# Patient Record
Sex: Female | Born: 1960 | Race: White | Hispanic: No | Marital: Married | Smoking: Never smoker
Health system: Southern US, Community
[De-identification: ages and names within clinical notes are randomized; demographics above are authoritative.]

## PROBLEM LIST (undated history)

## (undated) ENCOUNTER — Ambulatory Visit (HOSPITAL_COMMUNITY): Admission: EM | Payer: Self-pay

## (undated) ENCOUNTER — Inpatient Hospital Stay (HOSPITAL_COMMUNITY): Payer: Self-pay

## (undated) DIAGNOSIS — E785 Hyperlipidemia, unspecified: Secondary | ICD-10-CM

## (undated) DIAGNOSIS — F32A Depression, unspecified: Secondary | ICD-10-CM

## (undated) DIAGNOSIS — Z5189 Encounter for other specified aftercare: Secondary | ICD-10-CM

## (undated) DIAGNOSIS — D509 Iron deficiency anemia, unspecified: Secondary | ICD-10-CM

## (undated) DIAGNOSIS — J45909 Unspecified asthma, uncomplicated: Secondary | ICD-10-CM

## (undated) DIAGNOSIS — F329 Major depressive disorder, single episode, unspecified: Secondary | ICD-10-CM

## (undated) DIAGNOSIS — T7840XA Allergy, unspecified, initial encounter: Secondary | ICD-10-CM

## (undated) DIAGNOSIS — G43909 Migraine, unspecified, not intractable, without status migrainosus: Secondary | ICD-10-CM

## (undated) DIAGNOSIS — O139 Gestational [pregnancy-induced] hypertension without significant proteinuria, unspecified trimester: Secondary | ICD-10-CM

## (undated) DIAGNOSIS — R87629 Unspecified abnormal cytological findings in specimens from vagina: Secondary | ICD-10-CM

## (undated) DIAGNOSIS — D75839 Thrombocytosis, unspecified: Secondary | ICD-10-CM

## (undated) DIAGNOSIS — G4730 Sleep apnea, unspecified: Secondary | ICD-10-CM

## (undated) DIAGNOSIS — O09529 Supervision of elderly multigravida, unspecified trimester: Secondary | ICD-10-CM

## (undated) DIAGNOSIS — F419 Anxiety disorder, unspecified: Secondary | ICD-10-CM

## (undated) DIAGNOSIS — M1990 Unspecified osteoarthritis, unspecified site: Secondary | ICD-10-CM

## (undated) DIAGNOSIS — D473 Essential (hemorrhagic) thrombocythemia: Secondary | ICD-10-CM

## (undated) DIAGNOSIS — K219 Gastro-esophageal reflux disease without esophagitis: Secondary | ICD-10-CM

## (undated) HISTORY — DX: Encounter for other specified aftercare: Z51.89

## (undated) HISTORY — DX: Iron deficiency anemia, unspecified: D50.9

## (undated) HISTORY — DX: Anxiety disorder, unspecified: F41.9

## (undated) HISTORY — DX: Unspecified osteoarthritis, unspecified site: M19.90

## (undated) HISTORY — DX: Allergy, unspecified, initial encounter: T78.40XA

## (undated) HISTORY — DX: Thrombocytosis, unspecified: D75.839

## (undated) HISTORY — DX: Essential (hemorrhagic) thrombocythemia: D47.3

## (undated) HISTORY — DX: Depression, unspecified: F32.A

## (undated) HISTORY — DX: Hyperlipidemia, unspecified: E78.5

## (undated) HISTORY — DX: Major depressive disorder, single episode, unspecified: F32.9

## (undated) HISTORY — PX: GYNECOLOGIC CRYOSURGERY: SHX857

## (undated) HISTORY — DX: Gastro-esophageal reflux disease without esophagitis: K21.9

## (undated) HISTORY — DX: Migraine, unspecified, not intractable, without status migrainosus: G43.909

## (undated) HISTORY — DX: Sleep apnea, unspecified: G47.30

---
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
## 1981-09-13 HISTORY — PX: NASAL SEPTUM SURGERY: SHX37

## 1989-05-14 DIAGNOSIS — Z5189 Encounter for other specified aftercare: Secondary | ICD-10-CM

## 1989-05-14 DIAGNOSIS — D509 Iron deficiency anemia, unspecified: Secondary | ICD-10-CM

## 1989-05-14 DIAGNOSIS — IMO0001 Reserved for inherently not codable concepts without codable children: Secondary | ICD-10-CM

## 1989-05-14 HISTORY — DX: Iron deficiency anemia, unspecified: D50.9

## 1989-05-14 HISTORY — DX: Reserved for inherently not codable concepts without codable children: IMO0001

## 1989-05-14 HISTORY — DX: Encounter for other specified aftercare: Z51.89

## 1998-05-20 ENCOUNTER — Other Ambulatory Visit: Admission: RE | Admit: 1998-05-20 | Discharge: 1998-05-20 | Payer: Self-pay | Admitting: Family Medicine

## 1999-06-17 ENCOUNTER — Other Ambulatory Visit: Admission: RE | Admit: 1999-06-17 | Discharge: 1999-06-17 | Payer: Self-pay | Admitting: Obstetrics and Gynecology

## 2001-05-02 ENCOUNTER — Other Ambulatory Visit: Admission: RE | Admit: 2001-05-02 | Discharge: 2001-05-02 | Payer: Self-pay | Admitting: Family Medicine

## 2001-05-08 ENCOUNTER — Encounter (HOSPITAL_COMMUNITY): Admission: RE | Admit: 2001-05-08 | Discharge: 2001-08-06 | Payer: Self-pay | Admitting: Family Medicine

## 2001-05-26 ENCOUNTER — Encounter (INDEPENDENT_AMBULATORY_CARE_PROVIDER_SITE_OTHER): Payer: Self-pay | Admitting: *Deleted

## 2001-05-26 ENCOUNTER — Ambulatory Visit (HOSPITAL_COMMUNITY): Admission: RE | Admit: 2001-05-26 | Discharge: 2001-05-26 | Payer: Self-pay | Admitting: Gastroenterology

## 2001-06-02 ENCOUNTER — Encounter: Payer: Self-pay | Admitting: Family Medicine

## 2001-06-02 ENCOUNTER — Encounter: Admission: RE | Admit: 2001-06-02 | Discharge: 2001-06-02 | Payer: Self-pay | Admitting: Family Medicine

## 2002-03-19 ENCOUNTER — Encounter: Payer: Self-pay | Admitting: Obstetrics and Gynecology

## 2002-03-19 ENCOUNTER — Ambulatory Visit (HOSPITAL_COMMUNITY): Admission: RE | Admit: 2002-03-19 | Discharge: 2002-03-19 | Payer: Self-pay | Admitting: Obstetrics and Gynecology

## 2002-07-16 ENCOUNTER — Other Ambulatory Visit: Admission: RE | Admit: 2002-07-16 | Discharge: 2002-07-16 | Payer: Self-pay | Admitting: Family Medicine

## 2003-09-05 ENCOUNTER — Other Ambulatory Visit: Admission: RE | Admit: 2003-09-05 | Discharge: 2003-09-05 | Payer: Self-pay | Admitting: Family Medicine

## 2004-09-15 ENCOUNTER — Other Ambulatory Visit: Admission: RE | Admit: 2004-09-15 | Discharge: 2004-09-15 | Payer: Self-pay | Admitting: Family Medicine

## 2006-12-15 ENCOUNTER — Encounter: Admission: RE | Admit: 2006-12-15 | Discharge: 2006-12-15 | Payer: Self-pay | Admitting: Family Medicine

## 2007-06-22 ENCOUNTER — Ambulatory Visit (HOSPITAL_COMMUNITY): Admission: RE | Admit: 2007-06-22 | Discharge: 2007-06-22 | Payer: Self-pay | Admitting: Obstetrics and Gynecology

## 2007-07-11 ENCOUNTER — Encounter: Admission: RE | Admit: 2007-07-11 | Discharge: 2007-07-11 | Payer: Self-pay | Admitting: Obstetrics and Gynecology

## 2007-10-19 ENCOUNTER — Ambulatory Visit (HOSPITAL_COMMUNITY): Admission: RE | Admit: 2007-10-19 | Discharge: 2007-10-19 | Payer: Self-pay | Admitting: Obstetrics and Gynecology

## 2007-10-19 ENCOUNTER — Encounter (INDEPENDENT_AMBULATORY_CARE_PROVIDER_SITE_OTHER): Payer: Self-pay | Admitting: Obstetrics and Gynecology

## 2008-06-27 ENCOUNTER — Observation Stay (HOSPITAL_COMMUNITY): Admission: AD | Admit: 2008-06-27 | Discharge: 2008-06-27 | Payer: Self-pay | Admitting: Obstetrics and Gynecology

## 2008-07-12 ENCOUNTER — Ambulatory Visit (HOSPITAL_COMMUNITY): Admission: RE | Admit: 2008-07-12 | Discharge: 2008-07-12 | Payer: Self-pay | Admitting: Obstetrics and Gynecology

## 2008-07-22 ENCOUNTER — Ambulatory Visit (HOSPITAL_COMMUNITY): Admission: RE | Admit: 2008-07-22 | Discharge: 2008-07-22 | Payer: Self-pay | Admitting: Obstetrics and Gynecology

## 2008-07-24 ENCOUNTER — Observation Stay (HOSPITAL_COMMUNITY): Admission: AD | Admit: 2008-07-24 | Discharge: 2008-07-25 | Payer: Self-pay | Admitting: Obstetrics and Gynecology

## 2008-08-06 ENCOUNTER — Ambulatory Visit (HOSPITAL_COMMUNITY): Admission: RE | Admit: 2008-08-06 | Discharge: 2008-08-06 | Payer: Self-pay | Admitting: Obstetrics and Gynecology

## 2008-08-09 ENCOUNTER — Ambulatory Visit (HOSPITAL_COMMUNITY): Admission: RE | Admit: 2008-08-09 | Discharge: 2008-08-09 | Payer: Self-pay | Admitting: Obstetrics and Gynecology

## 2008-08-15 ENCOUNTER — Ambulatory Visit (HOSPITAL_COMMUNITY): Admission: RE | Admit: 2008-08-15 | Discharge: 2008-08-15 | Payer: Self-pay | Admitting: Obstetrics and Gynecology

## 2008-08-19 ENCOUNTER — Encounter: Payer: Self-pay | Admitting: Obstetrics and Gynecology

## 2008-08-19 ENCOUNTER — Inpatient Hospital Stay (HOSPITAL_COMMUNITY): Admission: AD | Admit: 2008-08-19 | Discharge: 2008-08-27 | Payer: Self-pay | Admitting: Obstetrics and Gynecology

## 2008-08-20 ENCOUNTER — Encounter (HOSPITAL_COMMUNITY): Payer: Self-pay | Admitting: Obstetrics and Gynecology

## 2008-08-20 ENCOUNTER — Encounter (INDEPENDENT_AMBULATORY_CARE_PROVIDER_SITE_OTHER): Payer: Self-pay | Admitting: Obstetrics and Gynecology

## 2008-08-28 ENCOUNTER — Encounter: Admission: RE | Admit: 2008-08-28 | Discharge: 2008-09-27 | Payer: Self-pay | Admitting: Obstetrics and Gynecology

## 2008-09-28 ENCOUNTER — Encounter: Admission: RE | Admit: 2008-09-28 | Discharge: 2008-10-28 | Payer: Self-pay | Admitting: Obstetrics and Gynecology

## 2008-10-29 ENCOUNTER — Encounter: Admission: RE | Admit: 2008-10-29 | Discharge: 2008-11-25 | Payer: Self-pay | Admitting: Obstetrics and Gynecology

## 2008-11-26 ENCOUNTER — Encounter: Admission: RE | Admit: 2008-11-26 | Discharge: 2008-12-26 | Payer: Self-pay | Admitting: Obstetrics and Gynecology

## 2008-12-27 ENCOUNTER — Encounter: Admission: RE | Admit: 2008-12-27 | Discharge: 2009-01-25 | Payer: Self-pay | Admitting: Obstetrics and Gynecology

## 2009-01-26 ENCOUNTER — Encounter: Admission: RE | Admit: 2009-01-26 | Discharge: 2009-02-25 | Payer: Self-pay | Admitting: Obstetrics and Gynecology

## 2009-02-26 ENCOUNTER — Encounter: Admission: RE | Admit: 2009-02-26 | Discharge: 2009-03-27 | Payer: Self-pay | Admitting: Obstetrics and Gynecology

## 2009-03-28 ENCOUNTER — Encounter: Admission: RE | Admit: 2009-03-28 | Discharge: 2009-04-27 | Payer: Self-pay | Admitting: Obstetrics and Gynecology

## 2009-04-28 ENCOUNTER — Encounter: Admission: RE | Admit: 2009-04-28 | Discharge: 2009-05-28 | Payer: Self-pay | Admitting: Obstetrics and Gynecology

## 2009-05-29 ENCOUNTER — Encounter: Admission: RE | Admit: 2009-05-29 | Discharge: 2009-06-27 | Payer: Self-pay | Admitting: Obstetrics and Gynecology

## 2009-06-28 ENCOUNTER — Encounter: Admission: RE | Admit: 2009-06-28 | Discharge: 2009-07-28 | Payer: Self-pay | Admitting: Obstetrics and Gynecology

## 2009-07-29 ENCOUNTER — Encounter: Admission: RE | Admit: 2009-07-29 | Discharge: 2009-08-27 | Payer: Self-pay | Admitting: Obstetrics and Gynecology

## 2009-08-28 ENCOUNTER — Encounter: Admission: RE | Admit: 2009-08-28 | Discharge: 2009-09-11 | Payer: Self-pay | Admitting: Obstetrics and Gynecology

## 2009-09-28 ENCOUNTER — Encounter: Admission: RE | Admit: 2009-09-28 | Discharge: 2009-10-28 | Payer: Self-pay | Admitting: Obstetrics and Gynecology

## 2010-01-19 IMAGING — US US OB FOLLOW-UP
1 series · 14 of 28 positions shown · non-contrast
Comparison: none

OBSTETRICAL ULTRASOUND:
 This ultrasound was performed in The [HOSPITAL], and the [facility] report will be stored to [REDACTED] PACS.

[Series 1: us ob follow-up · 0.33mm/px · 49 acquisitions, 14 frames shown]
[im 2/49]
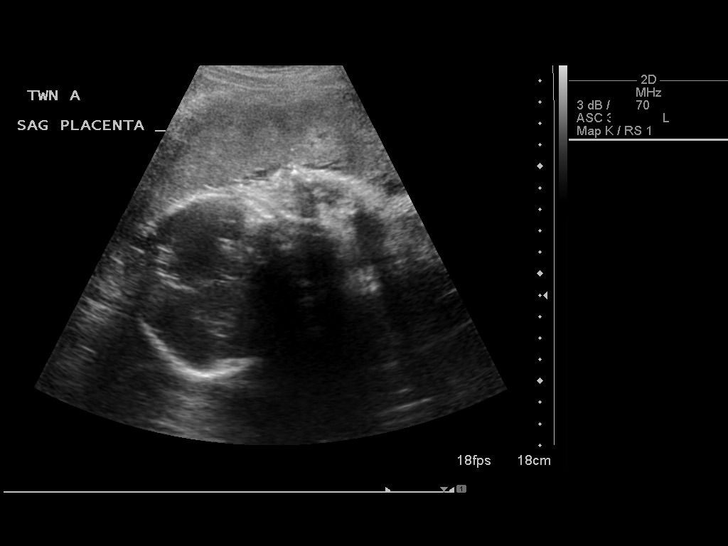
[im 6/49]
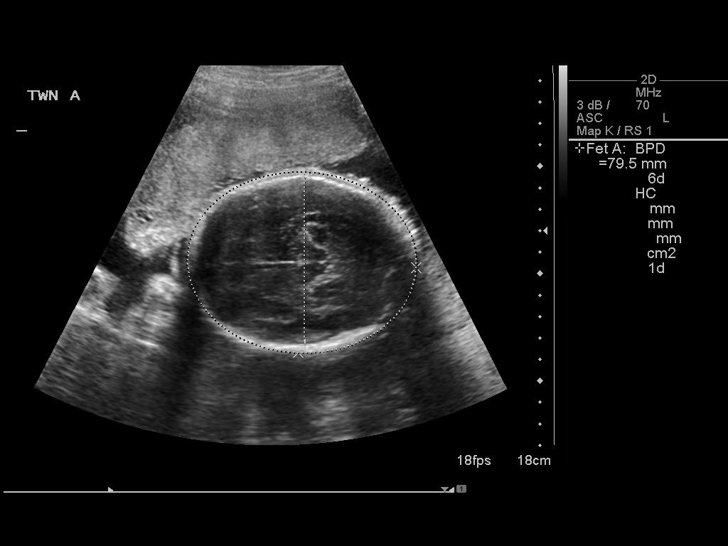
[im 9/49]
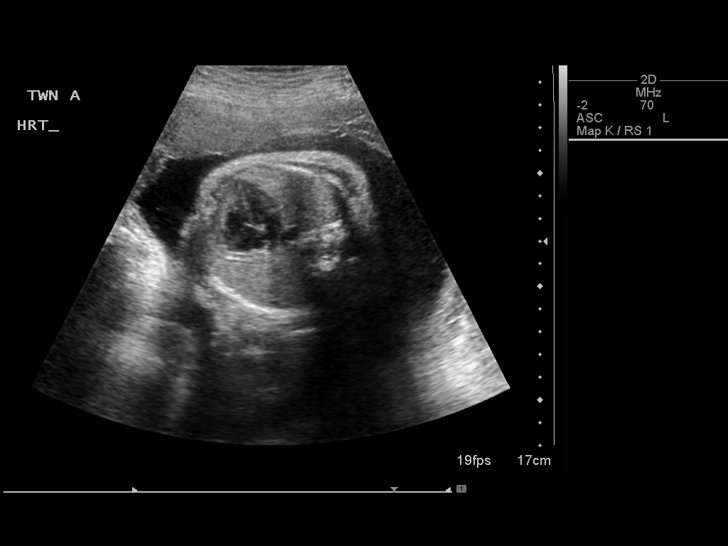
[im 13/49]
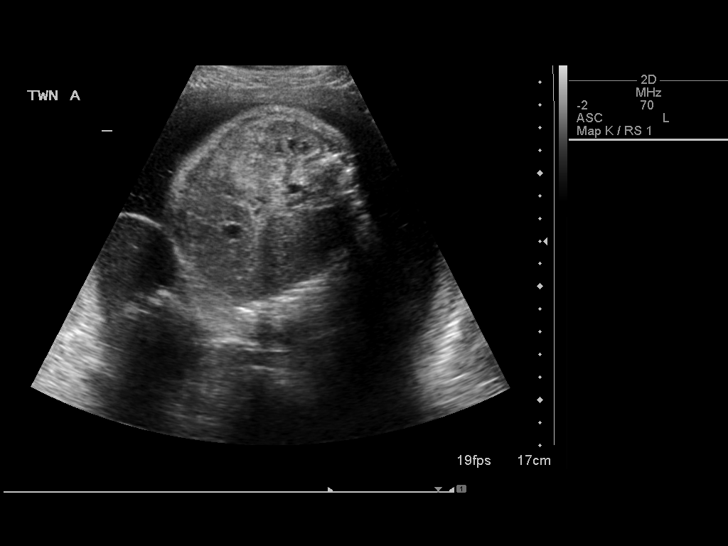
[im 17/49]
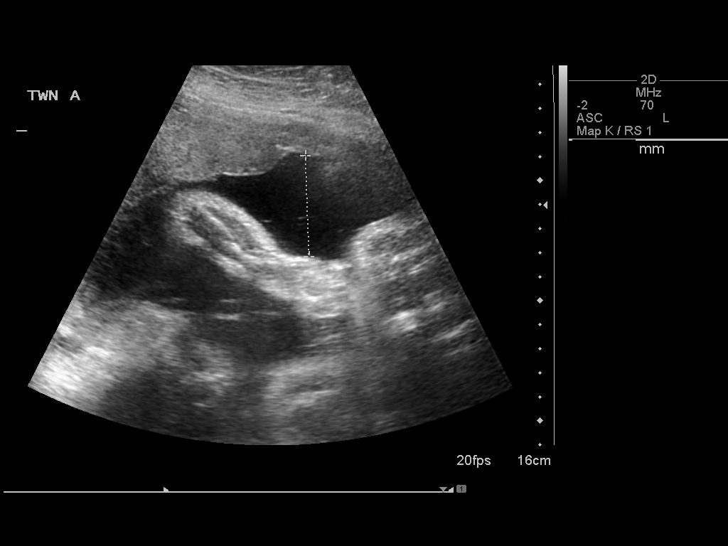
[im 20/49]
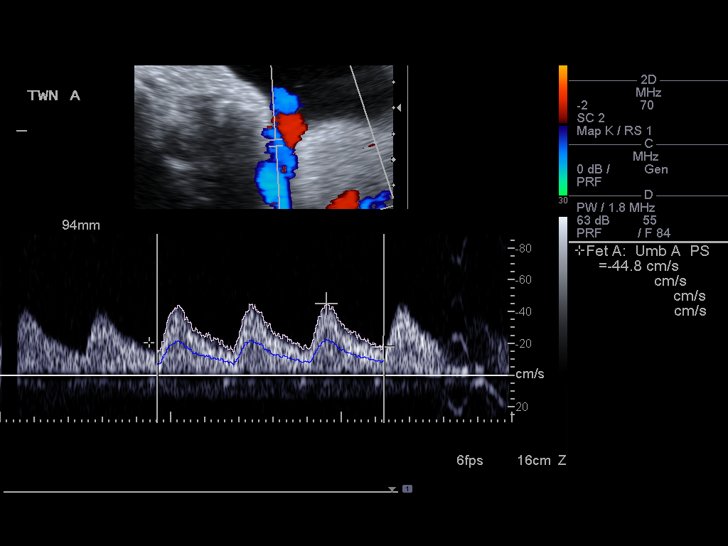
[im 24/49]
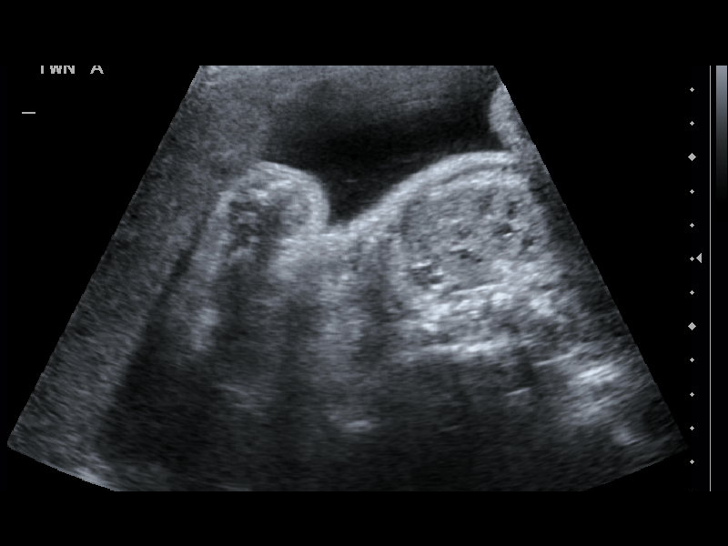
[im 27/49]
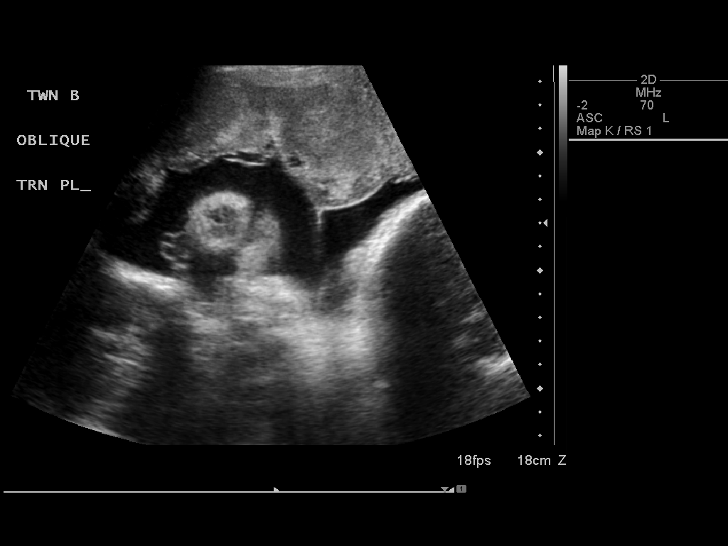
[im 31/49]
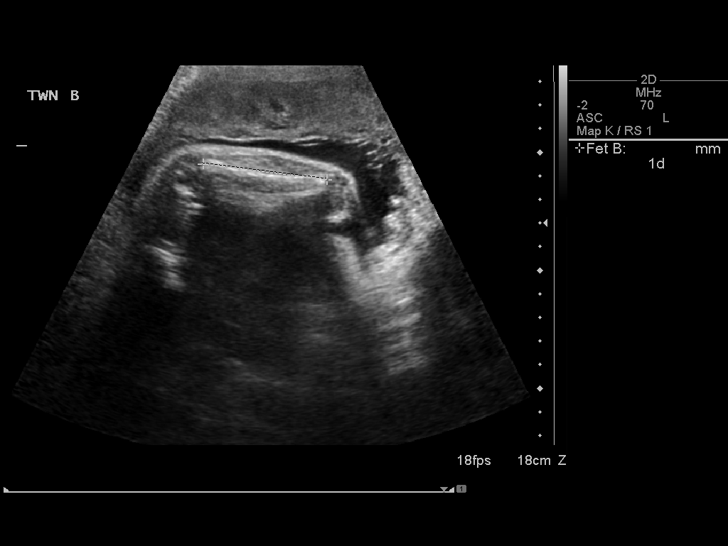
[im 34/49]
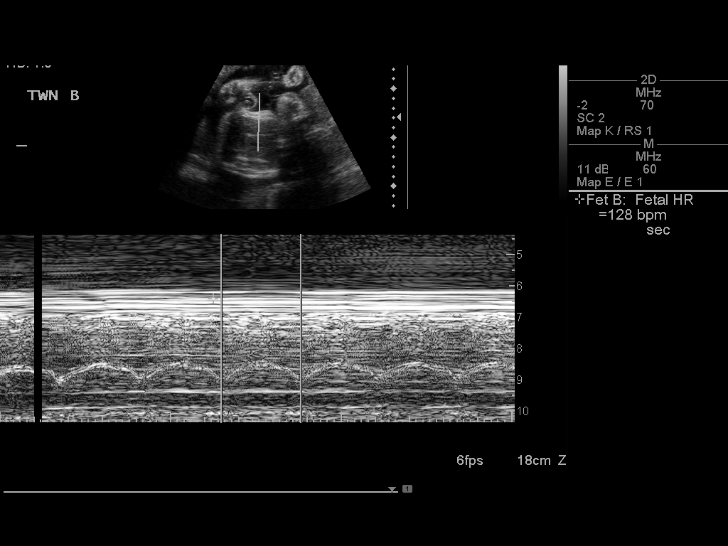
[im 38/49]
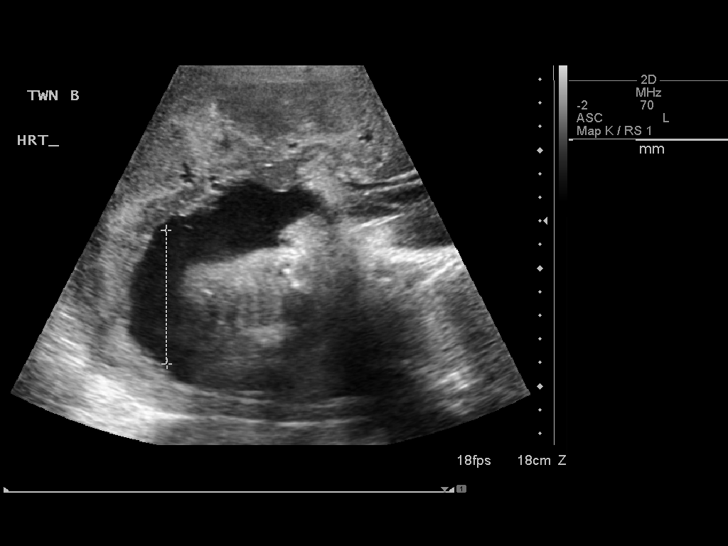
[im 41/49]
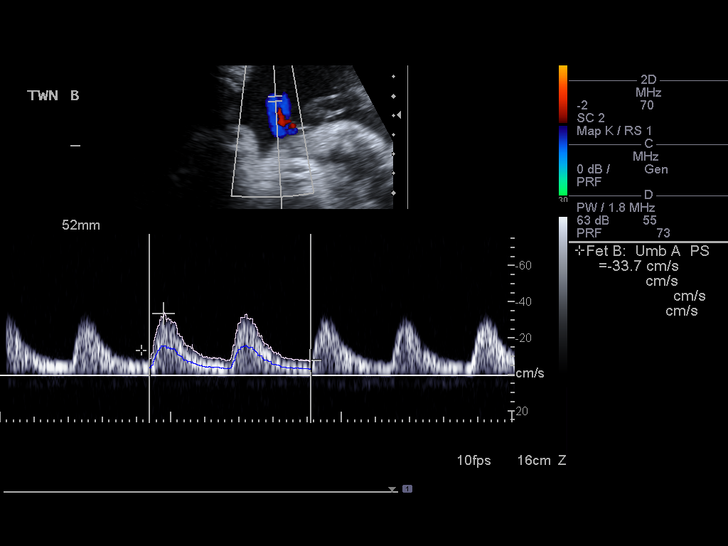
[im 45/49]
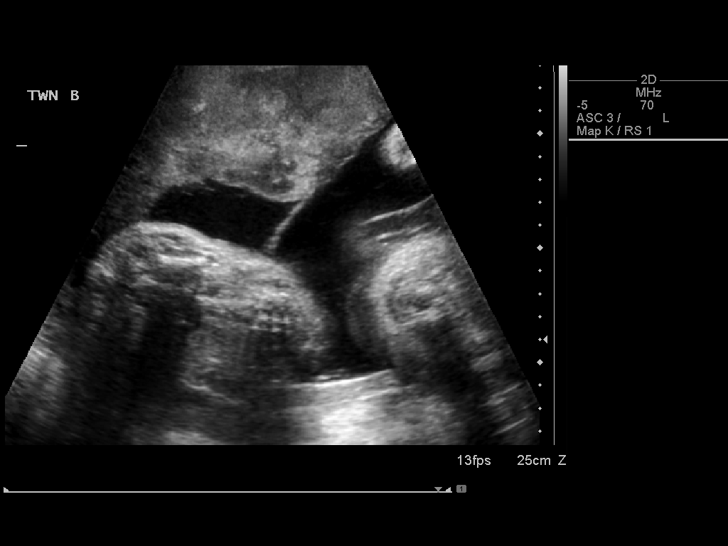
[im 49/49]
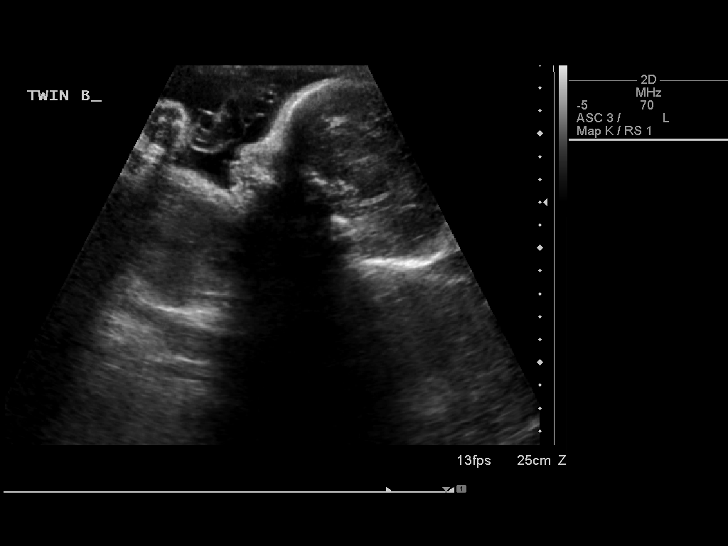

[14 of 28 positions shown; findings below may reference images not displayed]

IMPRESSION: [facility] has also been faxed to the ordering physician.

## 2010-10-04 ENCOUNTER — Encounter: Payer: Self-pay | Admitting: Obstetrics and Gynecology

## 2011-01-26 NOTE — Op Note (Signed)
NAME:  Bonnie Hall, Bonnie Hall NO.:  [id number]   MEDICAL RECORD NO.:  [id number]         PATIENT TYPE:  WINP   LOCATION:                                FACILITY:  [facility]   PHYSICIAN:  Zelphia Cairo, MD    DATE OF BIRTH:  Aug 20, 1961   DATE OF PROCEDURE:  08/20/2008  DATE OF DISCHARGE:                               OPERATIVE REPORT   PREOPERATIVE DIAGNOSES:  1. Twin gestation, breech position of baby A.  2. Intrauterine growth restriction baby B with gross discordance.  3. Increased ST ratio.   PROCEDURE:  Primary low transverse cesarean delivery.   SURGEON:  Zelphia Cairo, MD   ASSISTANT:  Stann Mainland. Vincente Poli, MD   ESTIMATED BLOOD LOSS:  800 mL.   FINDINGS:  Viable female infant in the breech presentation, viable female  infant in the oblique presentation, suspected placental abruption,  fibroid uterus, and normal-appearing adnexa.   SPECIMEN:  Placenta to Pathology.   COMPLICATIONS:  None.   CONDITION:  Stable to recovery room.   ANESTHESIA:  Spinal.   PROCEDURE IN DETAIL:  The patient was taken to the operating room where  spinal anesthesia was found to be adequate.  She was prepped and draped  in sterile fashion and a Foley catheter was inserted.  Pfannenstiel skin  incision was made with a scalpel and carried down to the underlying  fascia.  The fascia was incised in the midline.  This was extended  laterally using Mayo scissors.  Kocher clamps were used to grasp the  superior portion of the fascia.  The fascia was tented upwards and  underlying rectus muscles were dissected off using the Bovie.  The  inferior portion of the fascia was then tented upwards and the  underlying rectus muscles were dissected off using the Bovie.  Peritoneum was then identified and entered sharply with a hemostat.  This was extended superiorly and inferiorly with good visualization of  the bladder.  The bladder blade was then inserted.  Vesicouterine  peritoneum was incised  and extended laterally using Metzenbaum scissors.  The bladder blade was then reinserted behind the bladder flap.   Uterine incision was then made with the scalpel and this was extended  laterally using the surgeon's fingers.  Amniotic membranes of baby A was  ruptured for clear fluid and the baby was delivered in the breech  presentation using standard breech maneuvers, and the cord was clamped  and cut, and the infant was taken to the awaiting pediatric staff.  The  amniotic sac of baby B was then ruptured for clear fluid.  Infant was  noted to be oblique and delivered in the breech presentation using  breech maneuvers.  Cord was clamped and cut, and the infant was taken to  the awaiting pediatric staff.  The placenta was then manually removed  from the uterus.  The uterus was cleared of all clots and debris using a  dry lap sponge, and the uterus was exteriorized from the pelvis.  The  uterine incision was reapproximated using 0 chromic in a running locked  fashion.  Once hemostasis was assured, the  uterus was placed back into  the pelvis and the pelvis was copiously irrigated with warm normal  saline.  Uterine incision was reinspected and found to be hemostatic.  Peritoneum was then closed using a Vicryl.  The fascia was  reapproximated using Vicryl and the skin was closed with staples.  Sponge, lap, needle, and instrument counts were correct x2.  She was  taken to the recovery room in stable condition.      Zelphia Cairo, MD  Electronically Signed     GA/MEDQ  D:  08/20/2008  T:  08/21/2008  Job:  [id number]

## 2011-01-26 NOTE — Op Note (Signed)
NAME:  Bonnie Hall, Bonnie Hall           ACCOUNT NO.:  [id number]   MEDICAL RECORD NO.:  [id number]          PATIENT TYPE:  AMB   LOCATION:  SDC                           [facility]:  [facility]   PHYSICIAN:  Michelle L. Grewal, M.D.DATE OF BIRTH:  Sep 02, 1961   DATE OF PROCEDURE:  10/19/2007  DATE OF DISCHARGE:                               OPERATIVE REPORT   PREOPERATIVE DIAGNOSIS:  Endometrial polyp.   POSTOPERATIVE DIAGNOSIS:  Endometrial polyp.   PROCEDURE:  Dilatation and curettage, hysteroscopy.   SURGEON:  Michelle L. Vincente Poli, M.D.   ANESTHESIA:  MAC with local.   FINDINGS:  Endometrial curettings.  The hysteroscopy revealed a small  endometrial polyp.   PROCEDURE IN DETAIL:  The patient was taken to the operating room.  Anesthesia was administered.  She was prepped and draped in the usual  sterile fashion.  In-and-out catheter was inserted and the bladder was  drained.  The speculum was inserted, the cervix was grasped with a  tenaculum, and a paracervical block was performed in a standard fashion.  The cervical internal os was gently dilated using Pratt dilators.  The  diagnostic hysteroscope was inserted with excellent visualization of the  endometrial cavity revealing small polypoid areas. The hysteroscope was  removed.  A sharp uterine curettage was performed x2 with retrieval of  tissue consistent with small polyps.  The hysteroscope was inserted a  final time.  The uterine cavity was clean.  All instruments were removed  from the vagina.  The patient tolerated the procedure very well.  She  went to the recovery room in stable condition.      Michelle L. Vincente Poli, M.D.  Electronically Signed     MLG/MEDQ  D:  10/19/2007  T:  10/19/2007  Job:  [id number]

## 2011-01-26 NOTE — Discharge Summary (Signed)
NAME:  Bonnie Hall, Bonnie Hall           ACCOUNT NO.:  [id number]   MEDICAL RECORD NO.:  [id number]          PATIENT TYPE:  INP   LOCATION:  9303                          [facility]ILITY:  [facility]   PHYSICIAN:  Juluis Mire, M.D.   DATE OF BIRTH:  Jan 08, 1961   DATE OF ADMISSION:  08/19/2008  DATE OF DISCHARGE:  08/27/2008                               DISCHARGE SUMMARY   ADMITTING DIAGNOSES:  1. Intrauterine pregnancy at 14 and 5/7 weeks' estimated gestational      age.  2. Twin gestation with discordant growth.   DISCHARGE DIAGNOSES:  1. Status post low transverse cesarean section.  2. Viable female and female infants.  3. Pregnancy-induced hypertension, resolving.  4. Sinusitis.   PROCEDURE:  Primary low transverse cesarean section.   REASON FOR ADMISSION:  Please see written H and P.   HOSPITAL COURSE:  The patient is a 50 year old white married female that  was admitted to [facility] with a twin gestation at 18  and 5/7 weeks' estimated gestational age.  The patient has been followed  in the office, and had noted that she had twins with discordant growth.  The patient had undergone an ultrasound on the day of admission with  twin B with an estimated fetal weight of 2 pounds 11 ounces, which was  in the 1th percentile.  The patient was now admitted for further  evaluation.  The patient's pregnancy had been complicated by advanced  maternal age with an in vitro fertilization with a history of depression  and chronic fatigue syndrome.  The patient also was known to have a  bicornate uterus.  On admission, vital signs were stable.  Deep tendon  reflexes were 2+, no clonus.  Sonogram was performed with twin A, which  was within the breech presentation with biophysical profile 8/8 with  Dopplers okay.  Twin B within in the vertex presentation with  biophysical profile 6/8 with a normal Doppler study.  The patient was  now admitted for further evaluation on Maternal Fetal  Medicine  consultation.  The patient had previously received steroids at 21 weeks'  gestational age.  On hospital day #2, the patient did undergo further  ultrasound studies which had revealed continued discordant growth with  twin B with estimated fetal weight in the 1th percentile with normal  fluid.  Contractions were noted which had been resolved with some  terbutaline.  Fetal heart tones were reactive.  Based on Maternal Fetal  Medicine consultation,  decision was made to proceed with a cesarean  delivery.  The patient was then transferred to the operating room where  spinal anesthesia was administered without difficulty.  A low transverse  incision was made with delivery of viable female and female infants with  twin A in the breech presentation.  Twin B in the vertex presentation.  Uterus was known to have a fibroid, there was some suspicion for  placental abruption.  The placenta was now sent to Pathology for further  evaluation.  The patient tolerated the procedure well and was taken to  the recovery room in stable condition.  She was then  later transferred  to the [facility] where she was placed on magnesium sulfate.  Blood pressure  was 142/93.  Lungs were clear to auscultation.  Incision was clean, dry,  and intact.  Urine output was 650 mL per 8 hours.  Laboratory findings  showed hemoglobin of 11.8, platelet count of 281,000, SGOT and SGPT were  within normal limits.  On postoperative day #2, the patient was without  complaint.  Babies were stable in the NICU.  Vital signs were stable.  Blood pressure 140s/80s.  Abdomen soft with good return of bowel  function.  Abdominal dressing noted be clean, dry, and intact.  Magnesium sulfate was discontinued, and the patient was later  transferred to the mother-baby unit.  On postoperative day #3, the  patient complained of a mild temporal headache, no blurred vision or  right upper quadrant tenderness.  Vital signs were stable.  Blood   pressure was 144/86.  Deep tendon reflexes 1-2+ without clonus.  No  significant pedal edema was noted.  Abdomen was soft.  Fundus was firm  and nontender.  Incision was clean, dry, and intact with some ecchymosis  noted superior and inferior to the incisional site, which was soft.  The  patient was ambulating well.  The patient did have PIH labs that were  drawn.  During the night, blood pressure was noted to be 171/94 to  176/95.  Deep tendon reflexes 2+, no clonus.  The patient now complained  of continued persistent frontal headache without relief from medication.  She continued to deny visual disturbance or epigastric pain.  Lungs were  clear to auscultation.  Heart regular rate and rhythm.  Abdomen was  soft.  Incision was noted to have continued ecchymosis.  Deep tendon  reflexes are 2+.  Laboratory findings revealed platelet count of  397,000, creatinine was 0.62, AST was 67 and ALT was 42, decision was  made to start the patient back on magnesium sulfate.  She was now  transferred back to the AICU where she should be followed carefully.  In  the following morning, the patient continued to have a mild frontal  headache, no visual disturbance, no epigastric pain.  Blood pressure was  140s/80s.  Urine output was good.  Lungs continued to be clear to  auscultation.  Abdomen was soft.  Fundus firm and nontender.  Deep  tendon reflexes 2+, AST was 67, ALT was 42, magnesium was going at 2 g  per hour.  On the following morning, the patient complained of some  sinus headaches and stuffiness.  Blood pressure was 130s-140s/70s-80s.  SGOT was 38, SGPT was 41.  Incision now revealed some slight erythema.  Decision was made to start the patient on some Ceftin for questionable  early cellulitis and sinusitis.  On the following morning, the patient  denied any headache or blurred vision or right upper quadrant pain.  Blood pressure was 160/97 to 138/81.  She was afebrile.  Fundus firm and   nontender.  Incision to have a small hematoma.  PIH labs with a SGOT of  31 and SGPT of 36.  Otherwise, labs were within normal limits.  Discharge instructions were reviewed and the patient was later  discharged home.   CONDITION ON DISCHARGE:  Stable.   DIET:  Regular as tolerated.   ACTIVITY:  No heavy lifting, no driving x2 weeks, no vaginal entry.   FOLLOWUP:  The patient is to follow up in the office in 2-3 days for  incision check  and blood pressure check.  She is to call for temperature  greater than 100 degrees, persistent nausea, vomiting, heavy vaginal  bleeding and/or redness or drainage from incisional site.  The patient  was also instructed to call for headache or right upper quadrant pain.   DISCHARGE MEDICATIONS:  1. Ceftin 250 mg one p.o. b.i.d. x10 days.  2. Tylox #30, one p.o. every 4 hours p.r.n.  3. Prenatal vitamins one p.o. daily.      Julio Sicks, N.P.      Juluis Mire, M.D.  Electronically Signed    CC/MEDQ  D:  10/24/2008  T:  10/24/2008  Job:  [id number]

## 2011-01-29 NOTE — Procedures (Signed)
[facility]  Patient:    Bonnie Hall, Bonnie Hall Visit Number: [id number] MRN: [id number]          Service Type: END Location: ENDO Attending Physician:  Charna Elizabeth Dictated by:   Anselmo Rod, M.D. Proc. Date: 05/26/01 Admit Date:  05/26/2001   CC:         Talmadge Coventry, M.D.   Procedure Report  DATE OF BIRTH:  1961-05-30.  PROCEDURE:  Colonoscopy.  ENDOSCOPIST:  Anselmo Rod, M.D.  INSTRUMENT USED:  Olympus video colonoscope.  INDICATION FOR PROCEDURE:  A 50 year old white female with a history of iron deficiency anemia, hemoglobin being down to 6.6 g/dl in the recent past.  Rule out colonic polyps, masses, hemorrhoids, etc.  PREPROCEDURE PREPARATION:  Informed consent was procured from the patient. The patient was fasted for eight hours prior to the procedure and prepped with a bottle of magnesium citrate and a gallon of NuLytely the night prior to the procedure.  PREPROCEDURE PHYSICAL:  VITAL SIGNS:  The patient had stable vital signs.  NECK:  Supple.  CHEST:  Clear to auscultation.  S1, S2 regular.  ABDOMEN:  Soft with normal bowel sounds.  DESCRIPTION OF PROCEDURE:  The patient was placed in the left lateral decubitus position and sedated with an additional 2.5 mg of Versed intravenously.  Once the patient was adequately sedate and maintained on low-flow oxygen and continuous cardiac monitoring, the Olympus video colonoscope was advanced from the rectum to the cecum with slight difficulty. There was some residual stool in the colon, especially in the cecum and right colon.  The cecal base was not adequately visualized.  No masses, polyps, erosions, or ulcerations were noted.  A small, nonbleeding internal hemorrhoid was seen on retroflexion in the rectum.  The patient tolerated the procedure well without complication.  IMPRESSION: 1. A large amount of residual stool in the colon, cecal base difficult to  visualize. 2. No masses or polyps seen, no evidence of diverticulosis.  Small lesions    could have been missed. 3. Small, nonbleeding internal hemorrhoid.  RECOMMENDATIONS: 1. Proceed with a CT scan of the abdomen and pelvis and a small bowel    follow-through to complete the patients GI evaluation. 2. Continue iron supplementation. 3. Serial CBCs as discussed with the patient. Dictated by:   Anselmo Rod, M.D. Attending Physician:  Charna Elizabeth DD:  05/26/01 TD:  05/26/01 Job: [id number]

## 2011-04-06 ENCOUNTER — Other Ambulatory Visit: Payer: Self-pay | Admitting: Internal Medicine

## 2011-04-06 ENCOUNTER — Ambulatory Visit (HOSPITAL_COMMUNITY)
Admission: RE | Admit: 2011-04-06 | Discharge: 2011-04-06 | Disposition: A | Discharge status: Home / Self Care | Source: Ambulatory Visit | Attending: Internal Medicine | Admitting: Internal Medicine

## 2011-04-06 ENCOUNTER — Other Ambulatory Visit (HOSPITAL_COMMUNITY): Payer: Self-pay | Admitting: Internal Medicine

## 2011-04-06 DIAGNOSIS — M79609 Pain in unspecified limb: Secondary | ICD-10-CM | POA: Insufficient documentation

## 2011-04-06 DIAGNOSIS — M545 Low back pain, unspecified: Secondary | ICD-10-CM

## 2011-04-06 DIAGNOSIS — M546 Pain in thoracic spine: Secondary | ICD-10-CM | POA: Insufficient documentation

## 2011-04-06 DIAGNOSIS — G43909 Migraine, unspecified, not intractable, without status migrainosus: Secondary | ICD-10-CM

## 2011-04-07 ENCOUNTER — Ambulatory Visit
Admission: RE | Admit: 2011-04-07 | Discharge: 2011-04-07 | Disposition: A | Discharge status: Home / Self Care | Source: Ambulatory Visit | Attending: Internal Medicine | Admitting: Internal Medicine

## 2011-04-07 DIAGNOSIS — G43909 Migraine, unspecified, not intractable, without status migrainosus: Secondary | ICD-10-CM

## 2011-04-07 MED ORDER — GADOBENATE DIMEGLUMINE 529 MG/ML IV SOLN
17.0000 mL | Freq: Once | INTRAVENOUS | Status: AC | PRN
  Administered 2011-04-07: 17 mL via INTRAVENOUS

## 2011-06-04 LAB — CBC
HCT: 36.6
Hemoglobin: 12.2
MCHC: 33.4
MCV: 87.9
Platelets: 519 — ABNORMAL HIGH
RBC: 4.16
RDW: 16.2 — ABNORMAL HIGH
WBC: 5.6

## 2011-06-04 LAB — PREGNANCY, URINE: Preg Test, Ur: NEGATIVE

## 2011-06-15 LAB — COMPREHENSIVE METABOLIC PANEL
ALT: 20
ALT: 22
AST: 30
AST: 30
Albumin: 2.4 — ABNORMAL LOW
Albumin: 2.5 — ABNORMAL LOW
Alkaline Phosphatase: 76
Alkaline Phosphatase: 82
BUN: 11
BUN: 8
CO2: 23
CO2: 25
Calcium: 8.7
Calcium: 8.9
Chloride: 102
Chloride: 104
Creatinine, Ser: 0.63
Creatinine, Ser: 0.67
GFR calc Af Amer: >60
GFR calc Af Amer: >60
GFR calc non Af Amer: >60
GFR calc non Af Amer: >60
Glucose, Bld: 109 — ABNORMAL HIGH
Glucose, Bld: 138 — ABNORMAL HIGH
Potassium: 3.9
Potassium: 4.2
Sodium: 132 — ABNORMAL LOW
Sodium: 134 — ABNORMAL LOW
Total Bilirubin: 0.4
Total Bilirubin: 0.5
Total Protein: 5.4 — ABNORMAL LOW
Total Protein: 5.7 — ABNORMAL LOW

## 2011-06-15 LAB — PROTIME-INR
INR: 0.9
Prothrombin Time: 11.8

## 2011-06-15 LAB — CBC
HCT: 37.5
HCT: 39.8
Hemoglobin: 12.1
Hemoglobin: 13.2
MCHC: 32.3
MCHC: 33.2
MCV: 89.2
MCV: 90.5
Platelets: 276
Platelets: 305
RBC: 4.15
RBC: 4.46
RDW: 26.2 — ABNORMAL HIGH
RDW: 26.3 — ABNORMAL HIGH
WBC: 11.6 — ABNORMAL HIGH
WBC: 15.8 — ABNORMAL HIGH

## 2011-06-15 LAB — APTT: aPTT: 24

## 2011-06-15 LAB — KLEIHAUER-BETKE STAIN
Fetal Cells %: 0
Quantitation Fetal Hemoglobin: 0

## 2011-06-15 LAB — WET PREP, GENITAL
Clue Cells Wet Prep HPF POC: NONE SEEN
Trich, Wet Prep: NONE SEEN
WBC, Wet Prep HPF POC: NONE SEEN
Yeast Wet Prep HPF POC: NONE SEEN

## 2011-06-15 LAB — URIC ACID
Uric Acid, Serum: 4
Uric Acid, Serum: 4.8

## 2011-06-15 LAB — LACTATE DEHYDROGENASE: LDH: 128

## 2011-06-18 LAB — COMPREHENSIVE METABOLIC PANEL
ALT: 17 U/L (ref 0–35)
ALT: 17 U/L (ref 0–35)
ALT: 22 U/L (ref 0–35)
ALT: 36 U/L — ABNORMAL HIGH (ref 0–35)
ALT: 41 U/L — ABNORMAL HIGH (ref 0–35)
ALT: 42 U/L — ABNORMAL HIGH (ref 0–35)
ALT: 44 U/L — ABNORMAL HIGH (ref 0–35)
ALT: 46 U/L — ABNORMAL HIGH (ref 0–35)
ALT: 50 U/L — ABNORMAL HIGH (ref 0–35)
AST: 25 U/L (ref 0–37)
AST: 26 U/L (ref 0–37)
AST: 31 U/L (ref 0–37)
AST: 33 U/L (ref 0–37)
AST: 38 U/L — ABNORMAL HIGH (ref 0–37)
AST: 49 U/L — ABNORMAL HIGH (ref 0–37)
AST: 58 U/L — ABNORMAL HIGH (ref 0–37)
AST: 67 U/L — ABNORMAL HIGH (ref 0–37)
AST: 76 U/L — ABNORMAL HIGH (ref 0–37)
Albumin: 2 g/dL — ABNORMAL LOW (ref 3.5–5.2)
Albumin: 2.1 g/dL — ABNORMAL LOW (ref 3.5–5.2)
Albumin: 2.1 g/dL — ABNORMAL LOW (ref 3.5–5.2)
Albumin: 2.1 g/dL — ABNORMAL LOW (ref 3.5–5.2)
Albumin: 2.1 g/dL — ABNORMAL LOW (ref 3.5–5.2)
Albumin: 2.2 g/dL — ABNORMAL LOW (ref 3.5–5.2)
Albumin: 2.2 g/dL — ABNORMAL LOW (ref 3.5–5.2)
Albumin: 2.2 g/dL — ABNORMAL LOW (ref 3.5–5.2)
Albumin: 2.4 g/dL — ABNORMAL LOW (ref 3.5–5.2)
Alkaline Phosphatase: 122 U/L — ABNORMAL HIGH (ref 39–117)
Alkaline Phosphatase: 126 U/L — ABNORMAL HIGH (ref 39–117)
Alkaline Phosphatase: 126 U/L — ABNORMAL HIGH (ref 39–117)
Alkaline Phosphatase: 130 U/L — ABNORMAL HIGH (ref 39–117)
Alkaline Phosphatase: 131 U/L — ABNORMAL HIGH (ref 39–117)
Alkaline Phosphatase: 133 U/L — ABNORMAL HIGH (ref 39–117)
Alkaline Phosphatase: 134 U/L — ABNORMAL HIGH (ref 39–117)
Alkaline Phosphatase: 145 U/L — ABNORMAL HIGH (ref 39–117)
Alkaline Phosphatase: 151 U/L — ABNORMAL HIGH (ref 39–117)
BUN: 12 mg/dL (ref 6–23)
BUN: 13 mg/dL (ref 6–23)
BUN: 14 mg/dL (ref 6–23)
BUN: 14 mg/dL (ref 6–23)
BUN: 16 mg/dL (ref 6–23)
BUN: 17 mg/dL (ref 6–23)
BUN: 17 mg/dL (ref 6–23)
BUN: 19 mg/dL (ref 6–23)
BUN: 22 mg/dL (ref 6–23)
CO2: 24 mEq/L (ref 19–32)
CO2: 24 mEq/L (ref 19–32)
CO2: 25 mEq/L (ref 19–32)
CO2: 26 mEq/L (ref 19–32)
CO2: 26 mEq/L (ref 19–32)
CO2: 27 mEq/L (ref 19–32)
CO2: 28 mEq/L (ref 19–32)
CO2: 29 mEq/L (ref 19–32)
CO2: 30 mEq/L (ref 19–32)
Calcium: 6.5 mg/dL — ABNORMAL LOW (ref 8.4–10.5)
Calcium: 7.1 mg/dL — ABNORMAL LOW (ref 8.4–10.5)
Calcium: 7.9 mg/dL — ABNORMAL LOW (ref 8.4–10.5)
Calcium: 8.1 mg/dL — ABNORMAL LOW (ref 8.4–10.5)
Calcium: 8.3 mg/dL — ABNORMAL LOW (ref 8.4–10.5)
Calcium: 8.3 mg/dL — ABNORMAL LOW (ref 8.4–10.5)
Calcium: 8.4 mg/dL (ref 8.4–10.5)
Calcium: 8.6 mg/dL (ref 8.4–10.5)
Calcium: 8.7 mg/dL (ref 8.4–10.5)
Chloride: 100 mEq/L (ref 96–112)
Chloride: 101 mEq/L (ref 96–112)
Chloride: 101 mEq/L (ref 96–112)
Chloride: 103 mEq/L (ref 96–112)
Chloride: 103 mEq/L (ref 96–112)
Chloride: 104 mEq/L (ref 96–112)
Chloride: 104 mEq/L (ref 96–112)
Chloride: 104 mEq/L (ref 96–112)
Chloride: 106 mEq/L (ref 96–112)
Creatinine, Ser: 0.61 mg/dL (ref 0.4–1.2)
Creatinine, Ser: 0.62 mg/dL (ref 0.4–1.2)
Creatinine, Ser: 0.63 mg/dL (ref 0.4–1.2)
Creatinine, Ser: 0.66 mg/dL (ref 0.4–1.2)
Creatinine, Ser: 0.68 mg/dL (ref 0.4–1.2)
Creatinine, Ser: 0.71 mg/dL (ref 0.4–1.2)
Creatinine, Ser: 0.76 mg/dL (ref 0.4–1.2)
Creatinine, Ser: 0.76 mg/dL (ref 0.4–1.2)
Creatinine, Ser: 0.84 mg/dL (ref 0.4–1.2)
GFR calc Af Amer: >60 mL/min (ref >60)
GFR calc Af Amer: >60 mL/min (ref >60)
GFR calc Af Amer: >60 mL/min (ref >60)
GFR calc Af Amer: >60 mL/min (ref >60)
GFR calc Af Amer: >60 mL/min (ref >60)
GFR calc Af Amer: >60 mL/min (ref >60)
GFR calc Af Amer: >60 mL/min (ref >60)
GFR calc Af Amer: >60 mL/min (ref >60)
GFR calc Af Amer: >60 mL/min (ref >60)
GFR calc non Af Amer: >60 mL/min (ref >60)
GFR calc non Af Amer: >60 mL/min (ref >60)
GFR calc non Af Amer: >60 mL/min (ref >60)
GFR calc non Af Amer: >60 mL/min (ref >60)
GFR calc non Af Amer: >60 mL/min (ref >60)
GFR calc non Af Amer: >60 mL/min (ref >60)
GFR calc non Af Amer: >60 mL/min (ref >60)
GFR calc non Af Amer: >60 mL/min (ref >60)
GFR calc non Af Amer: >60 mL/min (ref >60)
Glucose, Bld: 103 mg/dL — ABNORMAL HIGH (ref 70–99)
Glucose, Bld: 103 mg/dL — ABNORMAL HIGH (ref 70–99)
Glucose, Bld: 108 mg/dL — ABNORMAL HIGH (ref 70–99)
Glucose, Bld: 116 mg/dL — ABNORMAL HIGH (ref 70–99)
Glucose, Bld: 121 mg/dL — ABNORMAL HIGH (ref 70–99)
Glucose, Bld: 75 mg/dL (ref 70–99)
Glucose, Bld: 79 mg/dL (ref 70–99)
Glucose, Bld: 80 mg/dL (ref 70–99)
Glucose, Bld: 81 mg/dL (ref 70–99)
Potassium: 4.1 mEq/L (ref 3.5–5.1)
Potassium: 4.2 mEq/L (ref 3.5–5.1)
Potassium: 4.2 mEq/L (ref 3.5–5.1)
Potassium: 4.3 mEq/L (ref 3.5–5.1)
Potassium: 4.5 mEq/L (ref 3.5–5.1)
Potassium: 4.7 mEq/L (ref 3.5–5.1)
Potassium: 4.9 mEq/L (ref 3.5–5.1)
Potassium: 4.9 mEq/L (ref 3.5–5.1)
Potassium: 5.1 mEq/L (ref 3.5–5.1)
Sodium: 132 mEq/L — ABNORMAL LOW (ref 135–145)
Sodium: 133 mEq/L — ABNORMAL LOW (ref 135–145)
Sodium: 134 mEq/L — ABNORMAL LOW (ref 135–145)
Sodium: 134 mEq/L — ABNORMAL LOW (ref 135–145)
Sodium: 136 mEq/L (ref 135–145)
Sodium: 136 mEq/L (ref 135–145)
Sodium: 137 mEq/L (ref 135–145)
Sodium: 137 mEq/L (ref 135–145)
Sodium: 137 mEq/L (ref 135–145)
Total Bilirubin: 0.2 mg/dL — ABNORMAL LOW (ref 0.3–1.2)
Total Bilirubin: 0.2 mg/dL — ABNORMAL LOW (ref 0.3–1.2)
Total Bilirubin: 0.3 mg/dL (ref 0.3–1.2)
Total Bilirubin: 0.3 mg/dL (ref 0.3–1.2)
Total Bilirubin: 0.3 mg/dL (ref 0.3–1.2)
Total Bilirubin: 0.3 mg/dL (ref 0.3–1.2)
Total Bilirubin: 0.4 mg/dL (ref 0.3–1.2)
Total Bilirubin: 0.4 mg/dL (ref 0.3–1.2)
Total Bilirubin: 0.4 mg/dL (ref 0.3–1.2)
Total Protein: 4.9 g/dL — ABNORMAL LOW (ref 6.0–8.3)
Total Protein: 5.2 g/dL — ABNORMAL LOW (ref 6.0–8.3)
Total Protein: 5.2 g/dL — ABNORMAL LOW (ref 6.0–8.3)
Total Protein: 5.2 g/dL — ABNORMAL LOW (ref 6.0–8.3)
Total Protein: 5.3 g/dL — ABNORMAL LOW (ref 6.0–8.3)
Total Protein: 5.4 g/dL — ABNORMAL LOW (ref 6.0–8.3)
Total Protein: 5.4 g/dL — ABNORMAL LOW (ref 6.0–8.3)
Total Protein: 5.5 g/dL — ABNORMAL LOW (ref 6.0–8.3)
Total Protein: 5.6 g/dL — ABNORMAL LOW (ref 6.0–8.3)

## 2011-06-18 LAB — CBC
HCT: 31.6 % — ABNORMAL LOW (ref 36.0–46.0)
HCT: 32.3 % — ABNORMAL LOW (ref 36.0–46.0)
HCT: 32.4 % — ABNORMAL LOW (ref 36.0–46.0)
HCT: 33.2 % — ABNORMAL LOW (ref 36.0–46.0)
HCT: 33.6 % — ABNORMAL LOW (ref 36.0–46.0)
HCT: 33.6 % — ABNORMAL LOW (ref 36.0–46.0)
HCT: 35.2 % — ABNORMAL LOW (ref 36.0–46.0)
HCT: 36.4 % (ref 36.0–46.0)
HCT: 38 % (ref 36.0–46.0)
HCT: 39.1 % (ref 36.0–46.0)
Hemoglobin: 10.7 g/dL — ABNORMAL LOW (ref 12.0–15.0)
Hemoglobin: 10.8 g/dL — ABNORMAL LOW (ref 12.0–15.0)
Hemoglobin: 10.8 g/dL — ABNORMAL LOW (ref 12.0–15.0)
Hemoglobin: 11 g/dL — ABNORMAL LOW (ref 12.0–15.0)
Hemoglobin: 11 g/dL — ABNORMAL LOW (ref 12.0–15.0)
Hemoglobin: 11.3 g/dL — ABNORMAL LOW (ref 12.0–15.0)
Hemoglobin: 11.8 g/dL — ABNORMAL LOW (ref 12.0–15.0)
Hemoglobin: 11.9 g/dL — ABNORMAL LOW (ref 12.0–15.0)
Hemoglobin: 12.6 g/dL (ref 12.0–15.0)
Hemoglobin: 13 g/dL (ref 12.0–15.0)
MCHC: 32.6 g/dL (ref 30.0–36.0)
MCHC: 32.7 g/dL (ref 30.0–36.0)
MCHC: 33.2 g/dL (ref 30.0–36.0)
MCHC: 33.2 g/dL (ref 30.0–36.0)
MCHC: 33.2 g/dL (ref 30.0–36.0)
MCHC: 33.3 g/dL (ref 30.0–36.0)
MCHC: 33.4 g/dL (ref 30.0–36.0)
MCHC: 33.5 g/dL (ref 30.0–36.0)
MCHC: 33.6 g/dL (ref 30.0–36.0)
MCHC: 33.9 g/dL (ref 30.0–36.0)
MCV: 90.4 fL (ref 78.0–100.0)
MCV: 91.3 fL (ref 78.0–100.0)
MCV: 91.6 fL (ref 78.0–100.0)
MCV: 92.2 fL (ref 78.0–100.0)
MCV: 92.3 fL (ref 78.0–100.0)
MCV: 92.6 fL (ref 78.0–100.0)
MCV: 92.7 fL (ref 78.0–100.0)
MCV: 92.9 fL (ref 78.0–100.0)
MCV: 93.4 fL (ref 78.0–100.0)
MCV: 93.9 fL (ref 78.0–100.0)
Platelets: 251 10*3/uL (ref 150–400)
Platelets: 281 10*3/uL (ref 150–400)
Platelets: 287 10*3/uL (ref 150–400)
Platelets: 384 10*3/uL (ref 150–400)
Platelets: 397 10*3/uL (ref 150–400)
Platelets: 463 10*3/uL — ABNORMAL HIGH (ref 150–400)
Platelets: 467 10*3/uL — ABNORMAL HIGH (ref 150–400)
Platelets: 500 10*3/uL — ABNORMAL HIGH (ref 150–400)
Platelets: 516 10*3/uL — ABNORMAL HIGH (ref 150–400)
Platelets: 557 10*3/uL — ABNORMAL HIGH (ref 150–400)
RBC: 3.43 MIL/uL — ABNORMAL LOW (ref 3.87–5.11)
RBC: 3.47 MIL/uL — ABNORMAL LOW (ref 3.87–5.11)
RBC: 3.49 MIL/uL — ABNORMAL LOW (ref 3.87–5.11)
RBC: 3.58 MIL/uL — ABNORMAL LOW (ref 3.87–5.11)
RBC: 3.59 MIL/uL — ABNORMAL LOW (ref 3.87–5.11)
RBC: 3.62 MIL/uL — ABNORMAL LOW (ref 3.87–5.11)
RBC: 3.85 MIL/uL — ABNORMAL LOW (ref 3.87–5.11)
RBC: 3.93 MIL/uL (ref 3.87–5.11)
RBC: 4.16 MIL/uL (ref 3.87–5.11)
RBC: 4.32 MIL/uL (ref 3.87–5.11)
RDW: 26.1 % — ABNORMAL HIGH (ref 11.5–15.5)
RDW: 26.1 % — ABNORMAL HIGH (ref 11.5–15.5)
RDW: 26.2 % — ABNORMAL HIGH (ref 11.5–15.5)
RDW: 26.2 % — ABNORMAL HIGH (ref 11.5–15.5)
RDW: 26.3 % — ABNORMAL HIGH (ref 11.5–15.5)
RDW: 26.3 % — ABNORMAL HIGH (ref 11.5–15.5)
RDW: 26.3 % — ABNORMAL HIGH (ref 11.5–15.5)
RDW: 26.4 % — ABNORMAL HIGH (ref 11.5–15.5)
RDW: 26.5 % — ABNORMAL HIGH (ref 11.5–15.5)
RDW: 26.9 % — ABNORMAL HIGH (ref 11.5–15.5)
WBC: 10.3 10*3/uL (ref 4.0–10.5)
WBC: 10.9 10*3/uL — ABNORMAL HIGH (ref 4.0–10.5)
WBC: 11 10*3/uL — ABNORMAL HIGH (ref 4.0–10.5)
WBC: 11.1 10*3/uL — ABNORMAL HIGH (ref 4.0–10.5)
WBC: 12.3 10*3/uL — ABNORMAL HIGH (ref 4.0–10.5)
WBC: 12.3 10*3/uL — ABNORMAL HIGH (ref 4.0–10.5)
WBC: 13.1 10*3/uL — ABNORMAL HIGH (ref 4.0–10.5)
WBC: 8.8 10*3/uL (ref 4.0–10.5)
WBC: 9.5 10*3/uL (ref 4.0–10.5)
WBC: 9.9 10*3/uL (ref 4.0–10.5)

## 2011-06-18 LAB — URIC ACID
Uric Acid, Serum: 4.5 mg/dL (ref 2.4–7.0)
Uric Acid, Serum: 4.6 mg/dL (ref 2.4–7.0)
Uric Acid, Serum: 4.6 mg/dL (ref 2.4–7.0)
Uric Acid, Serum: 4.8 mg/dL (ref 2.4–7.0)
Uric Acid, Serum: 5.2 mg/dL (ref 2.4–7.0)
Uric Acid, Serum: 5.2 mg/dL (ref 2.4–7.0)
Uric Acid, Serum: 5.3 mg/dL (ref 2.4–7.0)
Uric Acid, Serum: 6.4 mg/dL (ref 2.4–7.0)

## 2011-06-18 LAB — RPR: RPR Ser Ql: NONREACTIVE

## 2011-06-18 LAB — LACTATE DEHYDROGENASE
LDH: 176 U/L (ref 94–250)
LDH: 224 U/L (ref 94–250)
LDH: 261 U/L — ABNORMAL HIGH (ref 94–250)

## 2011-12-07 ENCOUNTER — Other Ambulatory Visit: Payer: Self-pay | Admitting: Obstetrics and Gynecology

## 2011-12-22 ENCOUNTER — Ambulatory Visit (HOSPITAL_COMMUNITY)
Admission: RE | Admit: 2011-12-22 | Discharge: 2011-12-22 | Disposition: A | Discharge status: Home / Self Care | Source: Ambulatory Visit | Attending: Internal Medicine | Admitting: Internal Medicine

## 2011-12-22 ENCOUNTER — Other Ambulatory Visit (HOSPITAL_COMMUNITY): Payer: Self-pay | Admitting: Internal Medicine

## 2011-12-22 DIAGNOSIS — M79609 Pain in unspecified limb: Secondary | ICD-10-CM | POA: Insufficient documentation

## 2011-12-22 DIAGNOSIS — M7989 Other specified soft tissue disorders: Secondary | ICD-10-CM | POA: Insufficient documentation

## 2011-12-22 DIAGNOSIS — S62609A Fracture of unspecified phalanx of unspecified finger, initial encounter for closed fracture: Secondary | ICD-10-CM

## 2012-01-14 ENCOUNTER — Encounter: Payer: Self-pay | Admitting: Gastroenterology

## 2012-01-28 ENCOUNTER — Encounter

## 2012-02-09 ENCOUNTER — Encounter: Admitting: Gastroenterology

## 2012-03-28 ENCOUNTER — Telehealth: Payer: Self-pay | Admitting: Gastroenterology

## 2012-03-28 ENCOUNTER — Ambulatory Visit (AMBULATORY_SURGERY_CENTER): Admitting: *Deleted

## 2012-03-28 VITALS — Ht 66.0 in | Wt 170.0 lb

## 2012-03-28 DIAGNOSIS — Z1211 Encounter for screening for malignant neoplasm of colon: Secondary | ICD-10-CM

## 2012-03-28 MED ORDER — MOVIPREP 100 G PO SOLR: ORAL | Status: DC

## 2012-03-28 NOTE — Addendum Note (Signed)
Addended by: Sarina Ill ANN on: 03/28/2012 05:17 PM   Modules accepted: Orders

## 2012-03-28 NOTE — Telephone Encounter (Signed)
Spoke with patient.She was calling back to confirm the dosage on the pravastatin was 40 mg.Will give the message to [facility] nurse).The patient will call back if she has further questions. Ulis Rias RN

## 2012-04-11 ENCOUNTER — Encounter: Payer: Self-pay | Admitting: Gastroenterology

## 2012-04-11 ENCOUNTER — Ambulatory Visit (AMBULATORY_SURGERY_CENTER): Admitting: Gastroenterology

## 2012-04-11 VITALS — BP 129/94 | HR 72 | Temp 98.6°F | Resp 22 | Ht 65.5 in | Wt 168.0 lb

## 2012-04-11 DIAGNOSIS — Z1211 Encounter for screening for malignant neoplasm of colon: Secondary | ICD-10-CM

## 2012-04-11 MED ORDER — SODIUM CHLORIDE 0.9 % IV SOLN: 500.0000 mL | INTRAVENOUS | Status: DC

## 2012-04-11 NOTE — Patient Instructions (Addendum)
Discharge instructions given with verbal understanding. Normal exam. Resume previous medications. YOU HAD AN ENDOSCOPIC PROCEDURE TODAY AT THE [facility]: Refer to the procedure report that was given to you for any specific questions about what was found during the examination.  If the procedure report does not answer your questions, please call your gastroenterologist to clarify.  If you requested that your care partner not be given the details of your procedure findings, then the procedure report has been included in a sealed envelope for you to review at your convenience later.  YOU SHOULD EXPECT: Some feelings of bloating in the abdomen. Passage of more gas than usual.  Walking can help get rid of the air that was put into your GI tract during the procedure and reduce the bloating. If you had a lower endoscopy (such as a colonoscopy or flexible sigmoidoscopy) you may notice spotting of blood in your stool or on the toilet paper. If you underwent a bowel prep for your procedure, then you may not have a normal bowel movement for a few days.  DIET: Your first meal following the procedure should be a light meal and then it is ok to progress to your normal diet.  A half-sandwich or bowl of soup is an example of a good first meal.  Heavy or fried foods are harder to digest and may make you feel nauseous or bloated.  Likewise meals heavy in dairy and vegetables can cause extra gas to form and this can also increase the bloating.  Drink plenty of fluids but you should avoid alcoholic beverages for 24 hours.  ACTIVITY: Your care partner should take you home directly after the procedure.  You should plan to take it easy, moving slowly for the rest of the day.  You can resume normal activity the day after the procedure however you should NOT DRIVE or use heavy machinery for 24 hours (because of the sedation medicines used during the test).    SYMPTOMS TO REPORT IMMEDIATELY: A gastroenterologist  can be reached at any hour.  During normal business hours, 8:30 AM to 5:00 PM Monday through Friday, call [phone].  After hours and on weekends, please call the GI answering service at [phone] who will take a message and have the physician on call contact you.   Following lower endoscopy (colonoscopy or flexible sigmoidoscopy):  Excessive amounts of blood in the stool  Significant tenderness or worsening of abdominal pains  Swelling of the abdomen that is new, acute  Fever of 100F or higher  FOLLOW UP: If any biopsies were taken you will be contacted by phone or by letter within the next 1-3 weeks.  Call your gastroenterologist if you have not heard about the biopsies in 3 weeks.  Our staff will call the home number listed on your records the next business day following your procedure to check on you and address any questions or concerns that you may have at that time regarding the information given to you following your procedure. This is a courtesy call and so if there is no answer at the home number and we have not heard from you through the emergency physician on call, we will assume that you have returned to your regular daily activities without incident.  SIGNATURES/CONFIDENTIALITY: You and/or your care partner have signed paperwork which will be entered into your electronic medical record.  These signatures attest to the fact that that the information above on your After Visit Summary has been reviewed   and is understood.  Full responsibility of the confidentiality of this discharge information lies with you and/or your care-partner. 

## 2012-04-11 NOTE — Progress Notes (Signed)
The pt tolerated the colonoscopy very well. Maw   

## 2012-04-11 NOTE — Op Note (Signed)
[facility] [address] [place], [place]  [address]  COLONOSCOPY PROCEDURE REPORT  PATIENT:  Bonnie, Hall  MR#:  [id number] BIRTHDATE:  Aug 07, 1961, 51 yrs. old  GENDER:  female ENDOSCOPIST:  Barbette Hair. Arlyce Dice, MD REF. BY:  Lucky Cowboy, M.D. PROCEDURE DATE:  04/11/2012 PROCEDURE:  Diagnostic Colonoscopy ASA CLASS:  Class II INDICATIONS:  Screening MEDICATIONS:   MAC sedation, administered by CRNA propofol 300mg IV  DESCRIPTION OF PROCEDURE:   After the risks benefits and alternatives of the procedure were thoroughly explained, informed consent was obtained.  Digital rectal exam was performed and revealed no abnormalities.   The LB PCF-[id number] endoscope was introduced through the anus and advanced to the cecum, which was identified by both the appendix and ileocecal valve, without limitations.  The quality of the prep was excellent, using MoviPrep.  The instrument was then slowly withdrawn as the colon was fully examined. <<PROCEDUREIMAGES>>  FINDINGS:  A normal appearing cecum, ileocecal valve, and appendiceal orifice were identified. The ascending, hepatic flexure, transverse, splenic flexure, descending, sigmoid colon, and rectum appeared unremarkable (see image1 and image2). Retroflexed views in the rectum revealed no abnormalities.    The time to cecum =  1) 2.25  minutes. The scope was then withdrawn in 1) withdraw time 2) 6.0  minutes from the cecum and the procedure completed. COMPLICATIONS:  None ENDOSCOPIC IMPRESSION: 1) Normal colon RECOMMENDATIONS: 1) Continue current colorectal screening recommendations for "routine risk" patients with a repeat colonoscopy in 10 years. REPEAT EXAM:  In 10 year(s) for Colonoscopy.  ______________________________ Barbette Hair. Arlyce Dice, MD  CC:  n. eSIGNED:   Barbette Hair. Shunna Mikaelian at 04/11/2012 10:27 AM  Laney Potash, [phone]

## 2012-04-12 ENCOUNTER — Telehealth: Payer: Self-pay | Admitting: *Deleted

## 2012-04-12 NOTE — Telephone Encounter (Signed)
No answer, message left for the patient. 

## 2012-06-13 HISTORY — PX: COLONOSCOPY: SHX174

## 2012-07-13 ENCOUNTER — Telehealth: Payer: Self-pay | Admitting: Oncology

## 2012-07-13 NOTE — Telephone Encounter (Signed)
S/W pt in re NP appt 11/13 @ 10:30 w/ Dr. Gaylyn Rong Referring Dr. Lucky Cowboy Dx- Thrombocytosis Welcome packet mailed.

## 2012-07-14 ENCOUNTER — Telehealth: Payer: Self-pay | Admitting: Oncology

## 2012-07-14 NOTE — Telephone Encounter (Signed)
C/D 07/14/12 for appt.07/26/12 °

## 2012-07-24 ENCOUNTER — Encounter: Payer: Self-pay | Admitting: Oncology

## 2012-07-24 DIAGNOSIS — D75839 Thrombocytosis, unspecified: Secondary | ICD-10-CM | POA: Insufficient documentation

## 2012-07-24 DIAGNOSIS — D509 Iron deficiency anemia, unspecified: Secondary | ICD-10-CM | POA: Insufficient documentation

## 2012-07-24 DIAGNOSIS — D473 Essential (hemorrhagic) thrombocythemia: Secondary | ICD-10-CM | POA: Insufficient documentation

## 2012-07-25 NOTE — Patient Instructions (Addendum)
1.  Issue:  Elevated platelet counts (thrombocytosis). 2.  Possibilities:  Iron deficiency anemia.  Need to rule out essential thrombocytosis (ET).  3.  Work up:  JAK-2 mutation.  Iron panel.  4.  Treatment:  Pending workup.  If iron deficiency: then iron replacement.  If ET, then start taking Asprin, and when turns 66, may consider medication (such as Hydrea) to bring down platelet count to decrease the risk of blood clot.

## 2012-07-26 ENCOUNTER — Ambulatory Visit

## 2012-07-26 ENCOUNTER — Encounter: Payer: Self-pay | Admitting: Oncology

## 2012-07-26 ENCOUNTER — Ambulatory Visit (HOSPITAL_BASED_OUTPATIENT_CLINIC_OR_DEPARTMENT_OTHER): Admitting: Oncology

## 2012-07-26 ENCOUNTER — Other Ambulatory Visit (HOSPITAL_BASED_OUTPATIENT_CLINIC_OR_DEPARTMENT_OTHER): Admitting: Lab

## 2012-07-26 ENCOUNTER — Telehealth: Payer: Self-pay | Admitting: Oncology

## 2012-07-26 VITALS — BP 109/75 | HR 85 | Temp 97.4°F | Resp 20 | Ht 65.5 in | Wt 171.5 lb

## 2012-07-26 DIAGNOSIS — D509 Iron deficiency anemia, unspecified: Secondary | ICD-10-CM

## 2012-07-26 DIAGNOSIS — D75839 Thrombocytosis, unspecified: Secondary | ICD-10-CM

## 2012-07-26 DIAGNOSIS — D473 Essential (hemorrhagic) thrombocythemia: Secondary | ICD-10-CM

## 2012-07-26 LAB — CBC WITH DIFFERENTIAL/PLATELET
BASO%: 1.9 % (ref 0.0–2.0)
Basophils Absolute: 0.1 10*3/uL (ref 0.0–0.1)
EOS%: 4 % (ref 0.0–7.0)
Eosinophils Absolute: 0.2 10*3/uL (ref 0.0–0.5)
HCT: 37.5 % (ref 34.8–46.6)
HGB: 12.6 g/dL (ref 11.6–15.9)
LYMPH%: 27.5 % (ref 14.0–49.7)
MCH: 30.7 pg (ref 25.1–34.0)
MCHC: 33.7 g/dL (ref 31.5–36.0)
MCV: 91.1 fL (ref 79.5–101.0)
MONO#: 0.5 10*3/uL (ref 0.1–0.9)
MONO%: 8.7 % (ref 0.0–14.0)
NEUT#: 3.1 10*3/uL (ref 1.5–6.5)
NEUT%: 57.9 % (ref 38.4–76.8)
Platelets: 466 10*3/uL — ABNORMAL HIGH (ref 145–400)
RBC: 4.12 10*6/uL (ref 3.70–5.45)
RDW: 14.3 % (ref 11.2–14.5)
WBC: 5.3 10*3/uL (ref 3.9–10.3)
lymph#: 1.5 10*3/uL (ref 0.9–3.3)

## 2012-07-26 LAB — MORPHOLOGY: PLT EST: INCREASED

## 2012-07-26 LAB — IRON AND TIBC
%SAT: 17 % — ABNORMAL LOW (ref 20–55)
Iron: 70 ug/dL (ref 42–145)
TIBC: 413 ug/dL (ref 250–470)
UIBC: 343 ug/dL (ref 125–400)

## 2012-07-26 LAB — CHCC SMEAR

## 2012-07-26 LAB — FERRITIN: Ferritin: 6 ng/mL — ABNORMAL LOW (ref 10–291)

## 2012-07-26 NOTE — Telephone Encounter (Signed)
gv and printed pt 1year appt for NOV 2014

## 2012-07-26 NOTE — Progress Notes (Signed)
[facility]  [phone] [phone]     INITIAL HEMATOLOGY CONSULTATION    Referral MD:  Dr. Lucky Cowboy, M.D.   Reason for Referral: thrombocytosis.     HPI:  Bonnie Hall. Alcoser is a 51 year-old woman with history of bipolar, HTN, GERD, HLP.  She has 71-year-old twins from assisted fertility.  She had history of iron deficiency anemia in 1990's.  She had work up by a Acupuncturist at [facility].  She did not improve with oral iron, IV iron; and thus required several pRBC transfusion for Hgb in the range of 6.  Her iron deficiency anemia resolved spontaneously.  She has been noted to have persistent thrombocytosis since 2009.  She was kindly referred to the [facility] for evaluation.  [name] presented to clinic by herself today.  She has had chronic fatigue for the past few years.  Her TSH has always been normal.  She is able to take care of the twins herself.  She denied visible source of bleeding.    Patient denies fever, anorexia, weight loss, headache, visual changes, confusion, drenching night sweats, palpable lymph node swelling, mucositis, odynophagia, dysphagia, nausea vomiting, jaundice, chest pain, palpitation, shortness of breath, dyspnea on exertion, productive cough, gum bleeding, epistaxis, hematemesis, hemoptysis, abdominal pain, abdominal swelling, early satiety, melena, hematochezia, hematuria, skin rash, spontaneous bleeding, joint swelling, joint pain, heat or cold intolerance, bowel bladder incontinence, back pain, focal motor weakness, paresthesia, depression.      Past Medical History  Diagnosis Date  . Allergy     Hay fever/exercise induced asthma  . Anxiety   . Blood transfusion 1990's  . Depression     bipolar  . GERD (gastroesophageal reflux disease)   . Hyperlipidemia   . Hypertension   . Thrombocytosis   . Iron deficiency anemia 1990's    negative work up as to etiology; required iron infusion, blood  transfusion.  Saw an hematologist  at [facility].   . Migraine   . Sleep apnea   :    Past Surgical History  Procedure Date  . Nasal septum surgery 1983  . Tonsillectomy 1966  . Cesarean section 2009  . Colonoscopy 06/2012    neg. with [place]  :   CURRENT MEDS: Current Outpatient Prescriptions  Medication Sig Dispense Refill  . ALPRAZolam (XANAX) 0.5 MG tablet Take 0.5 mg by mouth as needed.      Marland Kitchen buPROPion (WELLBUTRIN SR) 200 MG 12 hr tablet Take 200 mg by mouth daily.      . butalbital-acetaminophen-caffeine (FIORICET, ESGIC) 50-325-40 MG per tablet Take 1 tablet by mouth as needed. pain      . escitalopram (LEXAPRO) 20 MG tablet Take 20 mg by mouth daily.      Marland Kitchen esomeprazole (NEXIUM) 40 MG capsule Take 40 mg by mouth 2 (two) times daily.      . fexofenadine (ALLEGRA) 60 MG tablet Take 60 mg by mouth daily. As needed      . lamoTRIgine (LAMICTAL) 150 MG tablet Take 150 mg by mouth daily.      . Melatonin 3 MG CAPS Take 1 capsule by mouth at bedtime.      . montelukast (SINGULAIR) 10 MG tablet Take 10 mg by mouth at bedtime.      . Multiple Vitamins-Minerals (MULTIVITAMIN WITH MINERALS) tablet Take 1 tablet by mouth daily.      . pravastatin (PRAVACHOL) 40 MG tablet Take 40 mg by mouth daily.  No Known Allergies:  Family History  Problem Relation Age of Onset  . Colon cancer Paternal Uncle 71  . Colon cancer Paternal Uncle 76  . Prostate cancer Mother 67  . Breast cancer Paternal Aunt   :  History   Social History  . Marital Status: Married    Spouse Name: N/A    Number of Children: 2  . Years of Education: N/A   Occupational History  .      house wife   Social History Main Topics  . Smoking status: Never Smoker   . Smokeless tobacco: Never Used  . Alcohol Use: No  . Drug Use: No  . Sexually Active: Not on file   Other Topics Concern  . Not on file   Social History Narrative  . No narrative on file  :  REVIEW OF SYSTEM:  The rest of the  14-point review of sytem was negative.   Exam: ECOG 1.   General:  well-nourished woman, in no acute distress.  Eyes:  no scleral icterus.  ENT:  There were no oropharyngeal lesions.  Neck was without thyromegaly.  Lymphatics:  Negative cervical, supraclavicular or axillary adenopathy.  Respiratory: lungs were clear bilaterally without wheezing or crackles.  Cardiovascular:  Regular rate and rhythm, S1/S2, without murmur, rub or gallop.  There was no pedal edema.  GI:  abdomen was soft, flat, nontender, nondistended, without organomegaly.  Muscoloskeletal:  no spinal tenderness of palpation of vertebral spine.  Skin exam was without echymosis, petichae.  Neuro exam was nonfocal.  Patient was able to get on and off exam table without assistance.  Gait was normal.  Patient was alerted and oriented.  Attention was good.   Language was appropriate.  Mood was normal without depression.  Speech was not pressured.  Thought content was not tangential.    LABS:  Lab Results  Component Value Date   WBC 5.3 07/26/2012   HGB 12.6 07/26/2012   HCT 37.5 07/26/2012   PLT 466* 07/26/2012   GLUCOSE 103* 08/27/2008   ALT 36* 08/27/2008   AST 31 08/27/2008   NA 137 08/27/2008   K 4.2 DELTA CHECK NOTED 08/27/2008   CL 103 08/27/2008   CREATININE 0.66 08/27/2008   BUN 19 08/27/2008   CO2 26 08/27/2008   INR 0.9 07/24/2008   Iron 70; TIBC 413; % sat 17; Ferritin 6.   Blood smear review:   I personally reviewed the patient's peripheral blood smear today.  There was isocytosis.  There was no peripheral blast.  There was slight increased central pallor.  There was no polychromasia. There was no schistocytosis, spherocytosis, target cell, rouleaux formation, tear drop cell.  There was no giant platelets or platelet clumps.      ASSESSMENT AND PLAN:   1.  HTN:  On diet control. 2.  Hyperlipidemia:  On pravastatin.  3.  Depression/bipolar:  On Xanax, Wellbutrin, Lexapro.  4.  Migraine headache:  On  Lamotrigine.  5.  History of iron deficiency anemia with negative work up. 6.  Sleep apnea.  7.  Current iron deficiency without anemia:  However, given her sleep apnea, her Hgb should be higher than what it is.  8.  Chronic thrombocytosis at least since 2009.    - Possibilities:  Iron deficiency.  Need to rule out essential thrombocytosis (ET) and CML.  - Work up:  JAK-2 mutation.  Iron panel.  -  Treatment:  Empiric oral iron replacement.  If JAK-2 positive, we will  discuss further treatment options then.  - Follow up:  In one year but sooner if testing for ET and CML positive.  In the future, if her thrombocytosis significantly worsens, I may consider diagnostic bone marrow biopsy.  Her plt today is lower than what it used to be.    Thank you for this referral.    The length of time of the face-to-face encounter was 45  minutes. More than 50% of time was spent counseling and coordination of care.

## 2012-07-26 NOTE — Progress Notes (Signed)
Checked in new patient. No financial issues. °

## 2012-08-01 ENCOUNTER — Encounter: Payer: Self-pay | Admitting: Oncology

## 2012-12-20 ENCOUNTER — Other Ambulatory Visit: Payer: Self-pay | Admitting: Obstetrics and Gynecology

## 2013-06-28 ENCOUNTER — Other Ambulatory Visit: Payer: Self-pay | Admitting: Oncology

## 2013-06-29 ENCOUNTER — Telehealth: Payer: Self-pay | Admitting: Hematology and Oncology

## 2013-06-29 NOTE — Telephone Encounter (Signed)
s.w. pt and advised on 11.13.14 appt time change due to switching to Dr. Bertis Ruddy sched

## 2013-07-25 ENCOUNTER — Telehealth: Payer: Self-pay | Admitting: *Deleted

## 2013-07-25 MED ORDER — ESTRADIOL-NORETHINDRONE ACET 1-0.5 MG PO TABS: 1.0000 | ORAL_TABLET | Freq: Every day | ORAL | Status: DC

## 2013-07-25 MED ORDER — AZITHROMYCIN 250 MG PO TABS: ORAL_TABLET | ORAL | Status: DC

## 2013-07-25 NOTE — Telephone Encounter (Signed)
1.Pt is calling asking for a refill on z-pak says she is not feeling completley better thinks shes needing another round .    2.  Attivella 1mg   30 day rx to [facility] [phone] & will need to get a 90day written rx also

## 2013-07-26 ENCOUNTER — Other Ambulatory Visit (HOSPITAL_BASED_OUTPATIENT_CLINIC_OR_DEPARTMENT_OTHER): Admitting: Lab

## 2013-07-26 ENCOUNTER — Ambulatory Visit: Admitting: Oncology

## 2013-07-26 ENCOUNTER — Encounter: Payer: Self-pay | Admitting: Hematology and Oncology

## 2013-07-26 ENCOUNTER — Other Ambulatory Visit: Admitting: Lab

## 2013-07-26 ENCOUNTER — Ambulatory Visit (HOSPITAL_BASED_OUTPATIENT_CLINIC_OR_DEPARTMENT_OTHER): Admitting: Hematology and Oncology

## 2013-07-26 ENCOUNTER — Other Ambulatory Visit: Payer: Self-pay | Admitting: Hematology and Oncology

## 2013-07-26 VITALS — BP 121/71 | HR 85 | Temp 97.0°F | Resp 18 | Ht 65.5 in | Wt 173.8 lb

## 2013-07-26 DIAGNOSIS — D509 Iron deficiency anemia, unspecified: Secondary | ICD-10-CM

## 2013-07-26 DIAGNOSIS — D75839 Thrombocytosis, unspecified: Secondary | ICD-10-CM

## 2013-07-26 DIAGNOSIS — D473 Essential (hemorrhagic) thrombocythemia: Secondary | ICD-10-CM

## 2013-07-26 LAB — CBC WITH DIFFERENTIAL/PLATELET
BASO%: 0.8 % (ref 0.0–2.0)
Basophils Absolute: 0.1 10*3/uL (ref 0.0–0.1)
EOS%: 3.1 % (ref 0.0–7.0)
Eosinophils Absolute: 0.2 10*3/uL (ref 0.0–0.5)
HCT: 39.9 % (ref 34.8–46.6)
HGB: 13.3 g/dL (ref 11.6–15.9)
LYMPH%: 16.5 % (ref 14.0–49.7)
MCH: 31.4 pg (ref 25.1–34.0)
MCHC: 33.2 g/dL (ref 31.5–36.0)
MCV: 94.6 fL (ref 79.5–101.0)
MONO#: 0.6 10*3/uL (ref 0.1–0.9)
MONO%: 7.1 % (ref 0.0–14.0)
NEUT#: 5.6 10*3/uL (ref 1.5–6.5)
NEUT%: 72.5 % (ref 38.4–76.8)
Platelets: 385 10*3/uL (ref 145–400)
RBC: 4.22 10*6/uL (ref 3.70–5.45)
RDW: 13.3 % (ref 11.2–14.5)
WBC: 7.8 10*3/uL (ref 3.9–10.3)
lymph#: 1.3 10*3/uL (ref 0.9–3.3)

## 2013-07-26 LAB — FERRITIN CHCC: Ferritin: 56 ng/ml (ref 9–269)

## 2013-07-26 LAB — IRON AND TIBC CHCC
%SAT: 18 % — ABNORMAL LOW (ref 21–57)
Iron: 54 ug/dL (ref 41–142)
TIBC: 304 ug/dL (ref 236–444)
UIBC: 250 ug/dL (ref 120–384)

## 2013-07-26 NOTE — Progress Notes (Signed)
[name],[name], MD DIAGNOSIS:  Recurrent iron deficiency anemia  SUMMARY OF HEMATOLOGIC HISTORY: This is a pleasant 52 year old lady who was found to have history of iron deficiency anemia and thrombocytosis. She has received blood transfusion and intravenous iron infusion in the past. INTERVAL HISTORY: Bonnie Hall 52 y.o. female returns for  further followup. She's been compliant with oral iron supplement taken twice a day. She denies any pica The patient denies any recent signs or symptoms of bleeding such as spontaneous epistaxis, hematuria or hematochezia. Her last colonoscopy was a year ago. Recent Pap smear was normal. I have reviewed the past medical history, past surgical history, social history and family history with the patient and they are unchanged from previous note.  ALLERGIES:  has No Known Allergies.  MEDICATIONS:  Current Outpatient Prescriptions  Medication Sig Dispense Refill  . ALPRAZolam (XANAX) 0.5 MG tablet Take 0.5 mg by mouth as needed.      Marland Kitchen buPROPion (WELLBUTRIN SR) 200 MG 12 hr tablet Take 200 mg by mouth daily.      . butalbital-acetaminophen-caffeine (FIORICET, ESGIC) 50-325-40 MG per tablet Take 1 tablet by mouth as needed. pain      . esomeprazole (NEXIUM) 40 MG capsule Take 40 mg by mouth 2 (two) times daily.      Marland Kitchen estradiol-norethindrone (ACTIVELLA) 1-0.5 MG per tablet Take 1 tablet by mouth daily.  30 tablet  4  . fexofenadine (ALLEGRA) 60 MG tablet Take 60 mg by mouth daily. As needed      . Iron 66 MG TABS Take 1 tablet by mouth 2 (two) times daily.      Marland Kitchen lamoTRIgine (LAMICTAL) 150 MG tablet Take 150 mg by mouth daily.      . Melatonin 3 MG CAPS Take 1 capsule by mouth at bedtime.      . montelukast (SINGULAIR) 10 MG tablet Take 10 mg by mouth at bedtime.      . Multiple Vitamins-Minerals (MULTIVITAMIN WITH MINERALS) tablet Take 1 tablet by mouth daily.      . pravastatin  (PRAVACHOL) 40 MG tablet Take 40 mg by mouth daily.      Marland Kitchen escitalopram (LEXAPRO) 20 MG tablet Take 20 mg by mouth daily.       No current facility-administered medications for this visit.     REVIEW OF SYSTEMS:   Constitutional: Denies fevers, chills or night sweats All other systems were reviewed with the patient and are negative.  PHYSICAL EXAMINATION: ECOG PERFORMANCE STATUS: 0 - Asymptomatic  Filed Vitals:   07/26/13 1156  BP: 121/71  Pulse: 85  Temp: 97 F (36.1 C)  Resp: 18   Filed Weights   07/26/13 1156  Weight: 173 lb 12.8 oz (78.835 kg)    GENERAL:alert, no distress and comfortable NEURO: alert & oriented x 3 with fluent speech, no focal motor/sensory deficits  LABORATORY DATA:  I have reviewed the data as listed Results for orders placed in visit on 07/26/13 (from the past 48 hour(s))  CBC WITH DIFFERENTIAL     Status: None   Collection Time    07/26/13 11:45 AM      Result Value Range   WBC 7.8  3.9 - 10.3 10e3/uL   NEUT# 5.6  1.5 - 6.5 10e3/uL   HGB 13.3  11.6 - 15.9 g/dL   HCT 45.4  09.8 - 11.9 %   Platelets 385  145 - 400 10e3/uL   MCV 94.6  79.5 -  101.0 fL   MCH 31.4  25.1 - 34.0 pg   MCHC 33.2  31.5 - 36.0 g/dL   RBC 0.98  1.19 - 1.47 10e6/uL   RDW 13.3  11.2 - 14.5 %   lymph# 1.3  0.9 - 3.3 10e3/uL   MONO# 0.6  0.1 - 0.9 10e3/uL   Eosinophils Absolute 0.2  0.0 - 0.5 10e3/uL   Basophils Absolute 0.1  0.0 - 0.1 10e3/uL   NEUT% 72.5  38.4 - 76.8 %   LYMPH% 16.5  14.0 - 49.7 %   MONO% 7.1  0.0 - 14.0 %   EOS% 3.1  0.0 - 7.0 %   BASO% 0.8  0.0 - 2.0 %     ASSESSMENT:   #1 history of iron deficiency anemia #2 thrombocytosis, resolved  PLAN:   #1 history of iron deficiency anemia #2 thrombocytosis, resolved Because of her thrombocytosis is due to iron deficiency. She's been taking 2 iron tablets a day for the past one year. Iron studies are still pending. My aim would be to keep her ferritin level above 100. I will call the patient once  test results are available. The patient followup with her primary doctor on a regular basis. I would recommend checking her periodic CBC and ferritin in the future. I reassured the patient that the thrombocytosis is reactive in nature. It has resolved. Test for essential thrombocytosis was negative. All questions were answered. The patient knows to call the clinic with any problems, questions or concerns. No barriers to learning was detected.  I spent 15 minutes counseling the patient face to face. The total time spent in the appointment was 20 minutes and more than 50% was on counseling.     [facility], Bernarda Erck, MD 07/26/2013 12:26 PM

## 2013-07-27 ENCOUNTER — Other Ambulatory Visit: Payer: Self-pay | Admitting: Physician Assistant

## 2013-07-27 MED ORDER — ESTRADIOL-NORETHINDRONE ACET 1-0.5 MG PO TABS: 1.0000 | ORAL_TABLET | Freq: Every day | ORAL | Status: DC

## 2013-10-24 ENCOUNTER — Ambulatory Visit: Admitting: Physician Assistant

## 2013-10-24 VITALS — BP 112/74 | HR 80 | Temp 98.8°F | Resp 18 | Wt 171.6 lb

## 2013-10-24 DIAGNOSIS — J209 Acute bronchitis, unspecified: Secondary | ICD-10-CM

## 2013-10-24 MED ORDER — PREDNISONE 20 MG PO TABS: ORAL_TABLET | ORAL | Status: DC

## 2013-10-24 MED ORDER — ALBUTEROL SULFATE HFA 108 (90 BASE) MCG/ACT IN AERS: 2.0000 | INHALATION_SPRAY | Freq: Four times a day (QID) | RESPIRATORY_TRACT | Status: DC | PRN

## 2013-10-24 MED ORDER — AZITHROMYCIN 250 MG PO TABS: ORAL_TABLET | ORAL | Status: DC

## 2013-10-24 MED ORDER — BENZONATATE 200 MG PO CAPS: 200.0000 mg | ORAL_CAPSULE | Freq: Four times a day (QID) | ORAL | Status: DC | PRN

## 2013-10-24 NOTE — Patient Instructions (Signed)

## 2013-10-24 NOTE — Progress Notes (Signed)
   Subjective:    Patient ID: Bonnie Hall, female    DOB: [birth year], 53 y.o.   MRN: [id number]  Sore Throat  This is a new problem. The current episode started in the past 7 days. The problem has been gradually worsening. There has been no fever. Associated symptoms include abdominal pain, congestion, coughing, a hoarse voice and a plugged ear sensation. Pertinent negatives include no diarrhea, drooling, ear discharge, ear pain, headaches, neck pain, shortness of breath, stridor, swollen glands, trouble swallowing or vomiting. She has tried NSAIDs (alk cold, tylenol cold) for the symptoms. The treatment provided no relief.    Review of Systems  Constitutional: Positive for fatigue. Negative for fever and chills.  HENT: Positive for congestion and hoarse voice. Negative for drooling, ear discharge, ear pain and trouble swallowing.   Respiratory: Positive for cough. Negative for chest tightness, shortness of breath and stridor.   Cardiovascular: Negative.   Gastrointestinal: Positive for nausea and abdominal pain. Negative for vomiting and diarrhea.  Musculoskeletal: Positive for myalgias. Negative for neck pain.  Neurological: Negative for headaches.       Objective:   Physical Exam  Constitutional: She is oriented to person, place, and time. She appears well-developed and well-nourished.  HENT:  Head: Normocephalic and atraumatic.  Right Ear: External ear normal.  Left Ear: External ear normal.  Nose: Nose normal.  Mouth/Throat: Oropharynx is clear and moist.  Eyes: Conjunctivae are normal. Pupils are equal, round, and reactive to light.  Neck: Normal range of motion. Neck supple.  Cardiovascular: Normal rate and regular rhythm.   Pulmonary/Chest: Effort normal. No respiratory distress. She has wheezes. She has no rales. She exhibits no tenderness.  Abdominal: Soft. Bowel sounds are normal.  Lymphadenopathy:    She has no cervical adenopathy.  Neurological: She is alert and  oriented to person, place, and time.  Skin: Skin is warm and dry.      Assessment & Plan:  Acute bronchitis - Plan: azithromycin (ZITHROMAX) 250 MG tablet, predniSONE (DELTASONE) 20 MG tablet, albuterol (PROVENTIL HFA;VENTOLIN HFA) 108 (90 BASE) MCG/ACT inhaler, benzonatate (TESSALON PERLES) 200 MG capsule

## 2013-10-31 ENCOUNTER — Encounter: Payer: Self-pay | Admitting: Physician Assistant

## 2013-10-31 ENCOUNTER — Ambulatory Visit (INDEPENDENT_AMBULATORY_CARE_PROVIDER_SITE_OTHER): Admitting: Physician Assistant

## 2013-10-31 VITALS — BP 110/62 | HR 80 | Temp 97.7°F | Resp 16 | Ht 66.0 in | Wt 169.0 lb

## 2013-10-31 DIAGNOSIS — J040 Acute laryngitis: Secondary | ICD-10-CM

## 2013-10-31 DIAGNOSIS — B370 Candidal stomatitis: Secondary | ICD-10-CM

## 2013-10-31 MED ORDER — FLUCONAZOLE 100 MG PO TABS: 100.0000 mg | ORAL_TABLET | Freq: Every day | ORAL | Status: AC

## 2013-10-31 MED ORDER — NYSTATIN 100000 UNIT/ML MT SUSP: OROMUCOSAL | Status: DC

## 2013-10-31 NOTE — Progress Notes (Signed)
   Subjective:    Patient ID: Bonnie Hall, female    DOB: 07/09/1961, 53 y.o.   MRN: [id number]  HPI Was seen in the office 10/24/13 for bronchitis, was given zpak, prednisone, and tessalon. She feels that the breathing has improved but when she breathes deep she coughs yellow mucus. Tongue is white and feels like sand paper with sore throat and decreased energy.   Review of Systems  Constitutional: Positive for fatigue. Negative for fever and chills.  HENT: Positive for sore throat, trouble swallowing and voice change. Negative for congestion, ear pain, facial swelling, mouth sores, sneezing and tinnitus.   Eyes: Negative.   Respiratory: Positive for cough. Negative for chest tightness, shortness of breath and wheezing.   Cardiovascular: Negative.   Gastrointestinal: Negative.   Genitourinary: Negative.   Musculoskeletal: Negative.        Objective:   Physical Exam  Constitutional: She is oriented to person, place, and time. She appears well-developed and well-nourished.  HENT:  Head: Normocephalic and atraumatic.  Right Ear: External ear normal.  Left Ear: External ear normal.  Mouth/Throat: Oropharynx is clear and moist.  Thick white scrapable plaque on tongue and on post pharynx, no erythema,swelling.   Eyes: Conjunctivae and EOM are normal. Pupils are equal, round, and reactive to light.  Neck: Normal range of motion. Neck supple. No thyromegaly present.  Cardiovascular: Normal rate, regular rhythm and normal heart sounds.  Exam reveals no gallop and no friction rub.   No murmur heard. Pulmonary/Chest: Effort normal and breath sounds normal. No respiratory distress. She has no wheezes.  Abdominal: Soft. Bowel sounds are normal. She exhibits no distension and no mass. There is no tenderness. There is no rebound and no guarding.  Musculoskeletal: Normal range of motion.  Lymphadenopathy:    She has no cervical adenopathy.  Neurological: She is alert and oriented to  person, place, and time. She displays normal reflexes. No cranial nerve deficit. Coordination normal.  Skin: Skin is warm and dry.  Psychiatric: She has a normal mood and affect.      Assessment & Plan:  Thrush - Plan: nystatin (MYCOSTATIN) 100000 UNIT/ML suspension, fluconazole (DIFLUCAN) 100 MG tablet

## 2013-10-31 NOTE — Patient Instructions (Signed)
Cough, Adult  A cough is a reflex that helps clear your throat and airways. It can help heal the body or may be a reaction to an irritated airway. A cough may only last 2 or 3 weeks (acute) or may last more than 8 weeks (chronic).  CAUSES Acute cough:  Viral or bacterial infections. Chronic cough:  Infections.  Allergies.  Asthma.  Post-nasal drip.  Smoking.  Heartburn or acid reflux.  Some medicines.  Chronic lung problems (COPD).  Cancer. SYMPTOMS   Cough.  Fever.  Chest pain.  Increased breathing rate.  High-pitched whistling sound when breathing (wheezing).  Colored mucus that you cough up (sputum). TREATMENT   A bacterial cough may be treated with antibiotic medicine.  A viral cough must run its course and will not respond to antibiotics.  Your caregiver may recommend other treatments if you have a chronic cough. HOME CARE INSTRUCTIONS   Only take over-the-counter or prescription medicines for pain, discomfort, or fever as directed by your caregiver. Use cough suppressants only as directed by your caregiver.  Use a cold steam vaporizer or humidifier in your bedroom or home to help loosen secretions.  Sleep in a semi-upright position if your cough is worse at night.  Rest as needed.  Stop smoking if you smoke. SEEK IMMEDIATE MEDICAL CARE IF:   You have pus in your sputum.  Your cough starts to worsen.  You cannot control your cough with suppressants and are losing sleep.  You begin coughing up blood.  You have difficulty breathing.  You develop pain which is getting worse or is uncontrolled with medicine.  You have a fever. MAKE SURE YOU:   Understand these instructions.  Will watch your condition.  Will get help right away if you are not doing well or get worse. Document Released: 02/26/2011 Document Revised: 11/22/2011 Document Reviewed: 02/26/2011 [facility] Patient Information 2014 [place]. Thrush, Adult  Ritta Slot, also  called oral candidiasis, is a fungal infection that develops in the mouth and throat and on the tongue. It causes white patches to form on the mouth and tongue. Ritta Slot is most common in older adults, but it can occur at any age.  Many cases of thrush are mild, but this infection can also be more serious. Ritta Slot can be a recurring problem for people who have chronic illnesses or who take medicines that limit the body's ability to fight infection. Because these people have difficulty fighting infections, the fungus that causes thrush can spread throughout the body. This can cause life-threatening blood or organ infections. CAUSES  Ritta Slot is usually caused by a yeast called Candida albicans. This fungus is normally present in small amounts in the mouth and on other mucous membranes. It usually causes no harm. However, when conditions are present that allow the fungus to grow uncontrolled, it invades surrounding tissues and becomes an infection. Less often, other Candida species can also lead to thrush.  RISK FACTORS Ritta Slot is more likely to develop in the following people:  People with an impaired ability to fight infection (weakened immune system).   Older adults.   People with HIV.   People with diabetes.   People with dry mouth (xerostomia).   Pregnant women.   People with poor dental care, especially those who have false teeth.   People who use antibiotic medicines.  SIGNS AND SYMPTOMS  Ritta Slot can be a mild infection that causes no symptoms. If symptoms develop, they may include:   A burning feeling in the mouth  and throat. This can occur at the start of a thrush infection.   White patches that adhere to the mouth and tongue. The tissue around the patches may be red, raw, and painful. If rubbed (during tooth brushing, for example), the patches and the tissue of the mouth may bleed easily.   A bad taste in the mouth or difficulty tasting foods.   Cottony feeling in the  mouth.   Pain during eating and swallowing. DIAGNOSIS  Your health care provider can usually diagnose thrush by looking in your mouth and asking you questions about your health.  TREATMENT  Medicines that help prevent the growth of fungi (antifungals) are the standard treatment for thrush. These medicines are either applied directly to the affected area (topical) or swallowed (oral). The treatment will depend on the severity of the condition.  Mild Thrush Mild cases of thrush may clear up with the use of an antifungal mouth rinse or lozenges. Treatment usually lasts about 14 days.  Moderate to Severe Thrush  More severe thrush infections that have spread to the esophagus are treated with an oral antifungal medicine. A topical antifungal medicine may also be used.   For some severe infections, a treatment period longer than 14 days may be needed.   Oral antifungal medicines are almost never used during pregnancy because the fetus may be harmed. However, if a pregnant woman has a rare, severe thrush infection that has spread to her blood, oral antifungal medicines may be used. In this case, the risk of harm to the mother and fetus from the severe thrush infection may be greater than the risk posed by the use of antifungal medicines.  Persistent or Recurrent Thrush For cases of thrush that do not go away or keep coming back, treatment may involve the following:   Treatment may be needed twice as long as the symptoms last.   Treatment will include both oral and topical antifungal medicines.   People with weakened immune systems can take an antifungal medicine on a continuous basis to prevent thrush infections.  It is important to treat conditions that make you more likely to get thrush, such as diabetes or HIV.  HOME CARE INSTRUCTIONS   Only take over-the-counter or prescription medicine as directed by your health care provider. Talk to your health care provider about an  over-the-counter medicine called gentian violet, which kills bacteria and fungi.   Eat plain, unflavored yogurt as directed by your health care provider. Check the label to make sure the yogurt contains live cultures. This yogurt can help healthy bacteria grow in the mouth that can stop the growth of the fungus that causes thrush.   Try these measures to help reduce the discomfort of thrush:   Drink cold liquids such as water or iced tea.   Try flavored ice treats or frozen juices.   Eat foods that are easy to swallow, such as gelatin, ice cream, or custard.   If the patches in your mouth are painful, try drinking from a straw.   Rinse your mouth several times a day with a warm saltwater rinse. You can make the saltwater mixture with 1 tsp (6 g) of salt in 8 fl oz (0.2 L) of warm water.   If you wear dentures, remove the dentures before going to bed, brush them vigorously, and soak them in a cleaning solution as directed by your health care provider.   Women who are breastfeeding should clean their nipples with an antifungal medicine as  directed by their health care provider. Dry the nipples after breastfeeding. Applying lanolin-containing body lotion may help relieve nipple soreness.  SEEK MEDICAL CARE IF:  Your symptoms are getting worse or are not improving within 7 days of starting treatment.   You have symptoms of spreading infection, such as white patches on the skin outside of the mouth.   You are nursing and you have redness, burning, or pain in the nipples that is not relieved with treatment.  MAKE SURE YOU:  Understand these instructions.  Will watch your condition.  Will get help right away if you are not doing well or get worse. Document Released: 05/25/2004 Document Revised: 06/20/2013 Document Reviewed: 04/02/2013 [facility] Patient Information 2014 [place].

## 2013-12-24 ENCOUNTER — Other Ambulatory Visit: Payer: Self-pay | Admitting: Obstetrics and Gynecology

## 2014-01-13 DIAGNOSIS — G4730 Sleep apnea, unspecified: Secondary | ICD-10-CM | POA: Insufficient documentation

## 2014-01-13 DIAGNOSIS — K219 Gastro-esophageal reflux disease without esophagitis: Secondary | ICD-10-CM | POA: Insufficient documentation

## 2014-01-13 DIAGNOSIS — T7840XA Allergy, unspecified, initial encounter: Secondary | ICD-10-CM | POA: Insufficient documentation

## 2014-01-13 DIAGNOSIS — G43909 Migraine, unspecified, not intractable, without status migrainosus: Secondary | ICD-10-CM | POA: Insufficient documentation

## 2014-01-13 DIAGNOSIS — F319 Bipolar disorder, unspecified: Secondary | ICD-10-CM | POA: Insufficient documentation

## 2014-01-14 ENCOUNTER — Other Ambulatory Visit: Payer: Self-pay | Admitting: *Deleted

## 2014-01-14 ENCOUNTER — Ambulatory Visit (INDEPENDENT_AMBULATORY_CARE_PROVIDER_SITE_OTHER): Admitting: Internal Medicine

## 2014-01-14 ENCOUNTER — Encounter: Payer: Self-pay | Admitting: Internal Medicine

## 2014-01-14 VITALS — BP 122/78 | HR 76 | Temp 98.8°F | Resp 18 | Ht 65.0 in | Wt 169.4 lb

## 2014-01-14 DIAGNOSIS — Z111 Encounter for screening for respiratory tuberculosis: Secondary | ICD-10-CM

## 2014-01-14 DIAGNOSIS — R7401 Elevation of levels of liver transaminase levels: Secondary | ICD-10-CM

## 2014-01-14 DIAGNOSIS — Z1212 Encounter for screening for malignant neoplasm of rectum: Secondary | ICD-10-CM

## 2014-01-14 DIAGNOSIS — Z79899 Other long term (current) drug therapy: Secondary | ICD-10-CM

## 2014-01-14 DIAGNOSIS — R7402 Elevation of levels of lactic acid dehydrogenase (LDH): Secondary | ICD-10-CM

## 2014-01-14 DIAGNOSIS — D649 Anemia, unspecified: Secondary | ICD-10-CM

## 2014-01-14 DIAGNOSIS — I10 Essential (primary) hypertension: Secondary | ICD-10-CM

## 2014-01-14 DIAGNOSIS — E559 Vitamin D deficiency, unspecified: Secondary | ICD-10-CM

## 2014-01-14 DIAGNOSIS — R740 Nonspecific elevation of levels of transaminase and lactic acid dehydrogenase [LDH]: Secondary | ICD-10-CM

## 2014-01-14 DIAGNOSIS — R0989 Other specified symptoms and signs involving the circulatory and respiratory systems: Secondary | ICD-10-CM

## 2014-01-14 DIAGNOSIS — Z113 Encounter for screening for infections with a predominantly sexual mode of transmission: Secondary | ICD-10-CM

## 2014-01-14 DIAGNOSIS — Z0000 Encounter for general adult medical examination without abnormal findings: Secondary | ICD-10-CM

## 2014-01-14 LAB — CBC WITH DIFFERENTIAL/PLATELET
Basophils Absolute: 0.1 10*3/uL (ref 0.0–0.1)
Basophils Relative: 1 % (ref 0–1)
Eosinophils Absolute: 0.2 10*3/uL (ref 0.0–0.7)
Eosinophils Relative: 4 % (ref 0–5)
HCT: 44.5 % (ref 36.0–46.0)
Hemoglobin: 14.6 g/dL (ref 12.0–15.0)
Lymphocytes Relative: 23 % (ref 12–46)
Lymphs Abs: 1.4 10*3/uL (ref 0.7–4.0)
MCH: 30.8 pg (ref 26.0–34.0)
MCHC: 32.8 g/dL (ref 30.0–36.0)
MCV: 93.9 fL (ref 78.0–100.0)
Monocytes Absolute: 0.4 10*3/uL (ref 0.1–1.0)
Monocytes Relative: 7 % (ref 3–12)
Neutro Abs: 3.9 10*3/uL (ref 1.7–7.7)
Neutrophils Relative %: 65 % (ref 43–77)
Platelets: 503 10*3/uL — ABNORMAL HIGH (ref 150–400)
RBC: 4.74 MIL/uL (ref 3.87–5.11)
RDW: 13.5 % (ref 11.5–15.5)
WBC: 6 10*3/uL (ref 4.0–10.5)

## 2014-01-14 LAB — HEMOGLOBIN A1C
Hgb A1c MFr Bld: 5.6 % (ref <5.7)
Mean Plasma Glucose: 114 mg/dL (ref <117)

## 2014-01-14 MED ORDER — MONTELUKAST SODIUM 10 MG PO TABS: 10.0000 mg | ORAL_TABLET | Freq: Every day | ORAL | Status: DC

## 2014-01-14 MED ORDER — PRAVASTATIN SODIUM 40 MG PO TABS: 40.0000 mg | ORAL_TABLET | Freq: Every day | ORAL | Status: DC

## 2014-01-14 MED ORDER — LORATADINE 10 MG PO TABS: 10.0000 mg | ORAL_TABLET | Freq: Every day | ORAL | Status: DC

## 2014-01-14 MED ORDER — ESOMEPRAZOLE MAGNESIUM 40 MG PO CPDR: 40.0000 mg | DELAYED_RELEASE_CAPSULE | Freq: Two times a day (BID) | ORAL | Status: DC

## 2014-01-14 MED ORDER — ESTRADIOL-NORETHINDRONE ACET 1-0.5 MG PO TABS: 1.0000 | ORAL_TABLET | Freq: Every day | ORAL | Status: DC

## 2014-01-14 NOTE — Patient Instructions (Signed)

## 2014-01-14 NOTE — Progress Notes (Signed)
Patient ID: Bonnie Hall, female   DOB: 04/05/1961, 53 y.o.   MRN: [id number]   Annual Screening Comprehensive Examination  This very nice 53 y.o. MWF presents for complete physical.  Patient has been followed for labileHTN,   Prediabetes, Hyperlipidemia, and Vitamin D Deficiency. Other problems include MD Bipolar disorder and is followed by Dr Caprice Beaver. She had recent Gyn exam (pap & breasr ) by Dr Helane Rima. She also was seen in the past by Dr Lamonte Sakai for iron deficiency anemia.     Patient had labile HTN with her pregnancy 5-6 years ago and BP has been normal since. Patient's BP has been controlled and today's BP: 122/78 mmHg. Patient denies any cardiac symptoms as chest pain, palpitations, shortness of breath, dizziness or ankle swelling.   Patient's hyperlipidemia is controlled with diet and medications. Her LDL was 110 in Oct 2013.  Last cholesterol last visit was  163, triglycerides 146, HDL56  and LDL 78 in Apr 2014.     Patient has prediabetes with A1c 5.9% in July 2010 and last A1c was 5.7% in Oct 2014. Patient denies reactive hypoglycemic symptoms, visual blurring, diabetic polys, or paresthesias.    Finally, patient has history of Vitamin D Deficiency of 38 in Apr 2012 and last vitamin D was 74 in Oct 2014.     Current Outpatient Prescriptions on File Prior to Visit  Medication Sig Dispense Refill  . ALPRAZolam (XANAX) 0.5 MG tablet Take 0.5 mg by mouth as needed.      Marland Kitchen buPROPion (WELLBUTRIN SR) 200 MG 12 hr tablet Take 200 mg by mouth daily.      . butalbital-acetaminophen-caffeine (FIORICET, ESGIC) 50-325-40 MG per tablet Take 1 tablet by mouth as needed. pain      . escitalopram (LEXAPRO) 20 MG tablet Take 20 mg by mouth daily.      . Iron 66 MG TABS Take 1 tablet by mouth 2 (two) times daily.      Marland Kitchen lamoTRIgine (LAMICTAL) 150 MG tablet Take 150 mg by mouth daily.      . Melatonin 3 MG CAPS Take 1 capsule by mouth at bedtime.      . Multiple Vitamins-Minerals (MULTIVITAMIN WITH  MINERALS) tablet Take 1 tablet by mouth daily.       No current facility-administered medications on file prior to visit.    No Known Allergies  Past Medical History  Diagnosis Date  . Blood transfusion 1990's  . Thrombocytosis   . Migraine   . Sleep apnea   . Allergy     Hay fever/exercise induced asthma  . Iron deficiency anemia 1990's    negative work up as to etiology; required iron infusion, blood transfusion.  Saw an hematologist  at [facility].   Marland Kitchen Anxiety   . Depression     bipolar  . GERD (gastroesophageal reflux disease)   . Hyperlipidemia   . Hypertension     Past Surgical History  Procedure Laterality Date  . Nasal septum surgery  1983  . Tonsillectomy  1966  . Cesarean section  2009  . Colonoscopy  06/2012    neg. with [place]    Family History  Problem Relation Age of Onset  . Colon cancer Paternal Uncle 58  . Colon cancer Paternal Uncle 65  . Prostate cancer Mother 52  . Breast cancer Paternal Aunt     History  Substance Use Topics  . Smoking status: Never Smoker   . Smokeless tobacco: Never Used  . Alcohol Use: No  ROS Constitutional: Denies fever, chills, weight loss/gain, headaches, insomnia, fatigue, night sweats, and change in appetite. Eyes: Denies redness, blurred vision, diplopia, discharge, itchy, watery eyes.  ENT: Denies discharge, congestion, post nasal drip, epistaxis, sore throat, earache, hearing loss, dental pain, Tinnitus, Vertigo, Sinus pain, snoring.  Cardio: Denies chest pain, palpitations, irregular heartbeat, syncope, dyspnea, diaphoresis, orthopnea, PND, claudication, edema Respiratory: denies cough, dyspnea, DOE, pleurisy, hoarseness, laryngitis, wheezing.  Gastrointestinal: Denies dysphagia, heartburn, reflux, water brash, pain, cramps, nausea, vomiting, bloating, diarrhea, constipation, hematemesis, melena, hematochezia, jaundice, hemorrhoids Genitourinary: Denies dysuria, frequency, urgency, nocturia, hesitancy, discharge,  hematuria, flank pain Breast:Breast lumps, nipple discharge, bleeding.  Musculoskeletal: Denies arthralgia, myalgia, stiffness, Jt. Swelling, pain, limp, and strain/sprain. Skin: Denies puritis, rash, hives, warts, acne, eczema, changing in skin lesion Neuro: No weakness, tremor, incoordination, spasms, paresthesia, pain Psychiatric: Denies confusion, memory loss, sensory loss Endocrine: Denies change in weight, skin, hair change, nocturia, and paresthesia, diabetic polys, visual blurring, hyper / hypo glycemic episodes.  Heme/Lymph: No excessive bleeding, bruising, enlarged lymph nodes.   Physical Exam  BP 122/78  Pulse 76  Temp(Src) 98.8 F (37.1 C) (Temporal)  Resp 18  Ht 5\' 5"  (1.651 m)  Wt 169 lb 6.4 oz (76.839 kg)  BMI 28.19 kg/m2  LMP 03/12/2012  General Appearance: Well nourished, in no apparent distress. Eyes: PERRLA, EOMs, conjunctiva no swelling or erythema, normal fundi and vessels. Sinuses: No frontal/maxillary tenderness ENT/Mouth: EACs patent / TMs  nl. Nares clear without erythema, swelling, mucoid exudates. Oral hygiene is good. No erythema, swelling, or exudate. Tongue normal, non-obstructing. Tonsils not swollen or erythematous. Hearing normal.  Neck: Supple, thyroid normal. No bruits, nodes or JVD. Respiratory: Respiratory effort normal.  BS equal and clear bilateral without rales, rhonci, wheezing or stridor. Cardio: Heart sounds are normal with regular rate and rhythm and no murmurs, rubs or gallops. Peripheral pulses are normal and equal bilaterally without edema. No aortic or femoral bruits. Chest: symmetric with normal excursions and percussion. Breasts: Symmetric, without lumps, nipple discharge, retractions, or fibrocystic changes.  Abdomen: Flat, soft, with bowl sounds. Nontender, no guarding, rebound, hernias, masses, or organomegaly.  Lymphatics: Non tender without lymphadenopathy.  Musculoskeletal: Full ROM all peripheral extremities, joint stability,  5/5 strength, and normal gait. Skin: Warm and dry without rashes, lesions, cyanosis, clubbing or  ecchymosis.  Neuro: Cranial nerves intact, reflexes equal bilaterally. Normal muscle tone, no cerebellar symptoms. Sensation intact.  Pysch: Awake and oriented X 3, normal affect, Insight and Judgment appropriate.   Assessment and Plan  1. Annual Screening Examination 2. Hypertension, Hx 3. Hyperlipidemia, Hx 4. Pre Diabetes 5. Vitamin D Deficiency  Continue prudent diet as discussed, weight control, BP monitoring, regular exercise, and medications. Discussed med's effects and SE's. Screening labs and tests as requested with regular follow-up as recommended.

## 2014-01-15 LAB — BASIC METABOLIC PANEL WITH GFR
BUN: 26 mg/dL — ABNORMAL HIGH (ref 6–23)
CO2: 25 mEq/L (ref 19–32)
Calcium: 9.4 mg/dL (ref 8.4–10.5)
Chloride: 102 mEq/L (ref 96–112)
Creat: 0.8 mg/dL (ref 0.50–1.10)
GFR, Est African American: >89 mL/min
GFR, Est Non African American: 85 mL/min
Glucose, Bld: 112 mg/dL — ABNORMAL HIGH (ref 70–99)
Potassium: 4.5 mEq/L (ref 3.5–5.3)
Sodium: 138 mEq/L (ref 135–145)

## 2014-01-15 LAB — URINALYSIS, MICROSCOPIC ONLY
Bacteria, UA: NONE SEEN
Casts: NONE SEEN
Crystals: NONE SEEN

## 2014-01-15 LAB — INSULIN, FASTING: Insulin fasting, serum: 14 u[IU]/mL (ref 3–28)

## 2014-01-15 LAB — IRON AND TIBC
%SAT: 33 % (ref 20–55)
Iron: 116 ug/dL (ref 42–145)
TIBC: 349 ug/dL (ref 250–470)
UIBC: 233 ug/dL (ref 125–400)

## 2014-01-15 LAB — HEPATIC FUNCTION PANEL
ALT: 13 U/L (ref 0–35)
AST: 13 U/L (ref 0–37)
Albumin: 4.5 g/dL (ref 3.5–5.2)
Alkaline Phosphatase: 54 U/L (ref 39–117)
Bilirubin, Direct: 0.1 mg/dL (ref 0.0–0.3)
Indirect Bilirubin: 0.3 mg/dL (ref 0.2–1.2)
Total Bilirubin: 0.4 mg/dL (ref 0.2–1.2)
Total Protein: 6.9 g/dL (ref 6.0–8.3)

## 2014-01-15 LAB — LIPID PANEL
Cholesterol: 169 mg/dL (ref 0–200)
HDL: 48 mg/dL (ref >39)
LDL Cholesterol: 98 mg/dL (ref 0–99)
Total CHOL/HDL Ratio: 3.5 Ratio
Triglycerides: 117 mg/dL (ref <150)
VLDL: 23 mg/dL (ref 0–40)

## 2014-01-15 LAB — VITAMIN D 25 HYDROXY (VIT D DEFICIENCY, FRACTURES): Vit D, 25-Hydroxy: 86 ng/mL (ref 30–89)

## 2014-01-15 LAB — HEPATITIS C ANTIBODY: HCV Ab: NEGATIVE

## 2014-01-15 LAB — HEPATITIS B SURFACE ANTIBODY,QUALITATIVE: Hep B S Ab: NEGATIVE

## 2014-01-15 LAB — VITAMIN B12: Vitamin B-12: 499 pg/mL (ref 211–911)

## 2014-01-15 LAB — RPR

## 2014-01-15 LAB — MICROALBUMIN / CREATININE URINE RATIO
Creatinine, Urine: 136.1 mg/dL
Microalb Creat Ratio: 5.7 mg/g (ref 0.0–30.0)
Microalb, Ur: 0.77 mg/dL (ref 0.00–1.89)

## 2014-01-15 LAB — HIV ANTIBODY (ROUTINE TESTING W REFLEX): HIV 1&2 Ab, 4th Generation: NONREACTIVE

## 2014-01-15 LAB — MAGNESIUM: Magnesium: 2 mg/dL (ref 1.5–2.5)

## 2014-01-15 LAB — FERRITIN: Ferritin: 47 ng/mL (ref 10–291)

## 2014-01-15 LAB — TSH: TSH: 2.373 u[IU]/mL (ref 0.350–4.500)

## 2014-01-15 LAB — HEPATITIS B CORE ANTIBODY, TOTAL: Hep B Core Total Ab: NONREACTIVE

## 2014-01-15 LAB — HEPATITIS A ANTIBODY, TOTAL: Hep A Total Ab: NONREACTIVE

## 2014-01-16 LAB — HEPATITIS B E ANTIBODY: Hepatitis Be Antibody: NONREACTIVE

## 2014-01-17 LAB — TB SKIN TEST
Induration: 0 mm
TB Skin Test: NEGATIVE

## 2014-05-23 ENCOUNTER — Ambulatory Visit (INDEPENDENT_AMBULATORY_CARE_PROVIDER_SITE_OTHER): Admitting: Physician Assistant

## 2014-05-23 ENCOUNTER — Encounter: Payer: Self-pay | Admitting: Physician Assistant

## 2014-05-23 VITALS — BP 102/68 | HR 76 | Temp 97.7°F | Resp 16 | Ht 66.0 in | Wt 169.0 lb

## 2014-05-23 DIAGNOSIS — R0989 Other specified symptoms and signs involving the circulatory and respiratory systems: Secondary | ICD-10-CM

## 2014-05-23 DIAGNOSIS — E559 Vitamin D deficiency, unspecified: Secondary | ICD-10-CM

## 2014-05-23 DIAGNOSIS — D473 Essential (hemorrhagic) thrombocythemia: Secondary | ICD-10-CM

## 2014-05-23 DIAGNOSIS — D75839 Thrombocytosis, unspecified: Secondary | ICD-10-CM

## 2014-05-23 DIAGNOSIS — E785 Hyperlipidemia, unspecified: Secondary | ICD-10-CM

## 2014-05-23 DIAGNOSIS — I10 Essential (primary) hypertension: Secondary | ICD-10-CM

## 2014-05-23 DIAGNOSIS — Z79899 Other long term (current) drug therapy: Secondary | ICD-10-CM

## 2014-05-23 LAB — CBC WITH DIFFERENTIAL/PLATELET
Basophils Absolute: 0.1 10*3/uL (ref 0.0–0.1)
Basophils Relative: 1 % (ref 0–1)
Eosinophils Absolute: 0.2 10*3/uL (ref 0.0–0.7)
Eosinophils Relative: 4 % (ref 0–5)
HCT: 42.4 % (ref 36.0–46.0)
Hemoglobin: 13.9 g/dL (ref 12.0–15.0)
Lymphocytes Relative: 20 % (ref 12–46)
Lymphs Abs: 1.1 10*3/uL (ref 0.7–4.0)
MCH: 31.4 pg (ref 26.0–34.0)
MCHC: 32.8 g/dL (ref 30.0–36.0)
MCV: 95.7 fL (ref 78.0–100.0)
Monocytes Absolute: 0.7 10*3/uL (ref 0.1–1.0)
Monocytes Relative: 12 % (ref 3–12)
Neutro Abs: 3.6 10*3/uL (ref 1.7–7.7)
Neutrophils Relative %: 63 % (ref 43–77)
Platelets: 415 10*3/uL — ABNORMAL HIGH (ref 150–400)
RBC: 4.43 MIL/uL (ref 3.87–5.11)
RDW: 13.7 % (ref 11.5–15.5)
WBC: 5.7 10*3/uL (ref 4.0–10.5)

## 2014-05-23 LAB — LIPID PANEL
Cholesterol: 158 mg/dL (ref 0–200)
HDL: 46 mg/dL (ref >39)
LDL Cholesterol: 84 mg/dL (ref 0–99)
Total CHOL/HDL Ratio: 3.4 Ratio
Triglycerides: 138 mg/dL (ref <150)
VLDL: 28 mg/dL (ref 0–40)

## 2014-05-23 LAB — BASIC METABOLIC PANEL WITH GFR
BUN: 15 mg/dL (ref 6–23)
CO2: 27 mEq/L (ref 19–32)
Calcium: 9.7 mg/dL (ref 8.4–10.5)
Chloride: 101 mEq/L (ref 96–112)
Creat: 0.75 mg/dL (ref 0.50–1.10)
GFR, Est African American: >89 mL/min
GFR, Est Non African American: >89 mL/min
Glucose, Bld: 105 mg/dL — ABNORMAL HIGH (ref 70–99)
Potassium: 4.6 mEq/L (ref 3.5–5.3)
Sodium: 137 mEq/L (ref 135–145)

## 2014-05-23 LAB — HEPATIC FUNCTION PANEL
ALT: 12 U/L (ref 0–35)
AST: 14 U/L (ref 0–37)
Albumin: 4.5 g/dL (ref 3.5–5.2)
Alkaline Phosphatase: 49 U/L (ref 39–117)
Bilirubin, Direct: <0.1 mg/dL (ref 0.0–0.3)
Total Bilirubin: 0.2 mg/dL (ref 0.2–1.2)
Total Protein: 7.1 g/dL (ref 6.0–8.3)

## 2014-05-23 LAB — MAGNESIUM: Magnesium: 2 mg/dL (ref 1.5–2.5)

## 2014-05-23 MED ORDER — AZITHROMYCIN 250 MG PO TABS: ORAL_TABLET | ORAL | Status: AC

## 2014-05-23 MED ORDER — MOMETASONE FUROATE 50 MCG/ACT NA SUSP: 2.0000 | Freq: Every day | NASAL | Status: DC

## 2014-05-23 MED ORDER — LORATADINE 10 MG PO TABS: 10.0000 mg | ORAL_TABLET | Freq: Every day | ORAL | Status: DC

## 2014-05-23 MED ORDER — AZITHROMYCIN 250 MG PO TABS: ORAL_TABLET | ORAL | Status: DC

## 2014-05-23 NOTE — Patient Instructions (Signed)

## 2014-05-23 NOTE — Progress Notes (Signed)
Assessment and Plan:  Hypertension: Continue medication, monitor blood pressure at home. Continue DASH diet. Cholesterol: Continue diet and exercise. Check cholesterol.  Pre-diabetes-Continue diet and exercise. Check A1C Vitamin D Def- check level and continue medications.  Sinusitis- likely viral, take nasonex, allegra, and singulair, if not better can get zpak filled  Continue diet and meds as discussed. Further disposition pending results of labs.  HPI 53 y.o. female  presents for 3 month follow up with hypertension, hyperlipidemia, prediabetes and vitamin D. Her blood pressure has been controlled at home, today their BP is BP: 102/68 mmHg She does not workout. She denies chest pain, shortness of breath, dizziness.  She is on cholesterol medication and denies myalgias. Her cholesterol is at goal. The cholesterol last visit was:   Lab Results  Component Value Date   CHOL 169 01/14/2014   HDL 48 01/14/2014   LDLCALC 98 01/14/2014   TRIG 117 01/14/2014   CHOLHDL 3.5 01/14/2014   She has been working on diet and exercise for prediabetes, and denies polydipsia, polyuria and visual disturbances. Last A1C in the office was:  Lab Results  Component Value Date   HGBA1C 5.6 01/14/2014   Patient is on Vitamin D supplement.   Lab Results  Component Value Date   VD25OH 86 01/14/2014     Since Saturday she has had sinus congestion, sore throat and now productive cough, she denies fever, chills, SOB, wheezing. She is on singulair, nasonex but no allergy pill.    Current Medications:  Current Outpatient Prescriptions on File Prior to Visit  Medication Sig Dispense Refill  . ALPRAZolam (XANAX) 0.5 MG tablet Take 0.5 mg by mouth as needed.      Marland Kitchen buPROPion (WELLBUTRIN SR) 200 MG 12 hr tablet Take 200 mg by mouth daily.      . butalbital-acetaminophen-caffeine (FIORICET, ESGIC) 50-325-40 MG per tablet Take 1 tablet by mouth as needed. pain      . escitalopram (LEXAPRO) 20 MG tablet Take 20 mg by mouth  daily.      Marland Kitchen esomeprazole (NEXIUM) 40 MG capsule Take 1 capsule (40 mg total) by mouth 2 (two) times daily.  180 capsule  4  . estradiol-norethindrone (ACTIVELLA) 1-0.5 MG per tablet Take 1 tablet by mouth daily.  90 tablet  4  . Iron 66 MG TABS Take 1 tablet by mouth 2 (two) times daily.      Marland Kitchen lamoTRIgine (LAMICTAL) 150 MG tablet Take 150 mg by mouth daily.      . Melatonin 3 MG CAPS Take 1 capsule by mouth at bedtime.      . montelukast (SINGULAIR) 10 MG tablet Take 1 tablet (10 mg total) by mouth at bedtime.  90 tablet  4  . Multiple Vitamins-Minerals (MULTIVITAMIN WITH MINERALS) tablet Take 1 tablet by mouth daily.      . pravastatin (PRAVACHOL) 40 MG tablet Take 1 tablet (40 mg total) by mouth daily.  90 tablet  4   No current facility-administered medications on file prior to visit.   Medical History:  Past Medical History  Diagnosis Date  . Blood transfusion 1990's  . Thrombocytosis   . Migraine   . Sleep apnea   . Allergy     Hay fever/exercise induced asthma  . Iron deficiency anemia 1990's    negative work up as to etiology; required iron infusion, blood transfusion.  Saw an hematologist  at [facility].   Marland Kitchen Anxiety   . Depression     bipolar  .  GERD (gastroesophageal reflux disease)   . Hyperlipidemia   . Hypertension    Allergies: No Known Allergies   Review of Systems: [X]  = complains of  [ ]  = denies  General: Fatigue [ ]  Fever [ ]  Chills [ ]  Weakness [ ]   Insomnia [ ]  Eyes: Redness [ ]  Blurred vision [ ]  Diplopia [ ]   ENT: Congestion [x ] Sinus Pain [ x] Post Nasal Drip [ ]  Sore Throat [ ]  Earache [ ]   Cardiac: Chest pain/pressure [ ]  SOB [ ]  Orthopnea [ ]   Palpitations [ ]   Paroxysmal nocturnal dyspnea[ ]  Claudication [ ]  Edema [ ]   Pulmonary: Cough [ ]  Wheezing[ ]   SOB [ ]   Snoring [ ]   GI: Nausea [ ]  Vomiting[ ]  Dysphagia[ ]  Heartburn[ ]  Abdominal pain [ ]  Constipation [ ] ; Diarrhea [ ] ; BRBPR [ ]  Melena[ ]  GU: Hematuria[ ]  Dysuria [ ]  Nocturia[ ]  Urgency [ ]    Hesitancy [ ]  Discharge [ ]  Neuro: Headaches[ ]  Vertigo[ ]  Paresthesias[ ]  Spasm [ ]  Speech changes [ ]  Incoordination [ ]   Ortho: Arthritis [ ]  Joint pain [ ]  Muscle pain [ ]  Joint swelling [ ]  Back Pain [ ]  Skin:  Rash [ ]   Pruritis [ ]  Change in skin lesion [ ]   Psych: Depression[ ]  Anxiety[ ]  Confusion [ ]  Memory loss [ ]   Heme/Lypmh: Bleeding [ ]  Bruising [ ]  Enlarged lymph nodes [ ]   Endocrine: Visual blurring [ ]  Paresthesia [ ]  Polyuria [ ]  Polydypsea [ ]    Heat/cold intolerance [ ]  Hypoglycemia [ ]   Family history- Review and unchanged Social history- Review and unchanged Physical Exam: BP 102/68  Pulse 76  Temp(Src) 97.7 F (36.5 C)  Resp 16  Ht 5\' 6"  (1.676 m)  Wt 169 lb (76.658 kg)  BMI 27.29 kg/m2  LMP 03/12/2012 Wt Readings from Last 3 Encounters:  05/23/14 169 lb (76.658 kg)  01/14/14 169 lb 6.4 oz (76.839 kg)  10/31/13 169 lb (76.658 kg)   General Appearance: Well nourished, in no apparent distress. Eyes: PERRLA, EOMs, conjunctiva no swelling or erythema Sinuses: very mild maxillary tenderness ENT/Mouth: Ext aud canals clear, TMs without erythema, bulging. No erythema, swelling, or exudate on post pharynx.  Tonsils not swollen or erythematous. Hearing normal.  Neck: Supple, thyroid normal.  Respiratory: Respiratory effort normal, BS equal bilaterally without rales, rhonchi, wheezing or stridor.  Cardio: RRR with no MRGs. Brisk peripheral pulses without edema.  Abdomen: Soft, + BS.  Non tender, no guarding, rebound, hernias, masses. Lymphatics: Non tender without lymphadenopathy.  Musculoskeletal: Full ROM, 5/5 strength, normal gait.  Skin: Warm, dry without rashes, lesions, ecchymosis.  Neuro: Cranial nerves intact. Normal muscle tone, no cerebellar symptoms. Sensation intact.  Psych: Awake and oriented X 3, normal affect, Insight and Judgment appropriate.    Vicie Mutters 9:57 AM

## 2014-05-24 LAB — VITAMIN D 25 HYDROXY (VIT D DEFICIENCY, FRACTURES): Vit D, 25-Hydroxy: 106 ng/mL — ABNORMAL HIGH (ref 30–89)

## 2014-05-24 LAB — TSH: TSH: 1.006 u[IU]/mL (ref 0.350–4.500)

## 2014-06-05 ENCOUNTER — Emergency Department (INDEPENDENT_AMBULATORY_CARE_PROVIDER_SITE_OTHER)
Admission: EM | Admit: 2014-06-05 | Discharge: 2014-06-05 | Disposition: A | Discharge status: Home / Self Care | Payer: Self-pay | Source: Home / Self Care | Attending: Family Medicine | Admitting: Family Medicine

## 2014-06-05 ENCOUNTER — Encounter (HOSPITAL_COMMUNITY): Payer: Self-pay | Admitting: Emergency Medicine

## 2014-06-05 ENCOUNTER — Telehealth: Payer: Self-pay | Admitting: Physician Assistant

## 2014-06-05 DIAGNOSIS — R5381 Other malaise: Secondary | ICD-10-CM

## 2014-06-05 DIAGNOSIS — R5383 Other fatigue: Secondary | ICD-10-CM

## 2014-06-05 DIAGNOSIS — Z79899 Other long term (current) drug therapy: Secondary | ICD-10-CM

## 2014-06-05 LAB — FOLATE: Folate: 16.6 ng/mL

## 2014-06-05 NOTE — Discharge Instructions (Signed)
The cause of yorur symptoms are not clear There is concern that this is due to your lamictal level being too high.  Please call your psychologist tomorrow to discuss your symptoms and possibly change your lamictal Please call in the next 1-2 days to get the results of your labs Please remember to make other possible changes in your life to help with your symptoms such as not oversleeping, regular aerobic exercise, avoiding heavy foods.  Please also consider backing down on your vitamin D as your level was above normal.

## 2014-06-05 NOTE — ED Notes (Signed)
C/o feeling disoriented, light headed and "extreme fatigue" onset 4 weeks PCP gave her a Z-pack for uri sx; finished and tolerated well Reports she has not been sleeping as well Alert, no signs of acute distress.
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
## 2014-06-05 NOTE — ED Provider Notes (Signed)
CSN: [id number]     Arrival date & time 06/05/14  1652 History   First MD Initiated Contact with Patient 06/05/14 1730     Chief Complaint  Patient presents with  . Fatigue   (Consider location/radiation/quality/duration/timing/severity/associated sxs/prior Treatment) HPI  Fatigue: started 4 wks ago. Initially thought it was due to sleep deprivation so changed sleep pattern and now gets around 9-10 hrs of sleep nightly. Associated w/ general feeling of being disoriented adn "foggy." Feels like overmedicated feeling. No known exacerbating or alleviating factors. Given Z-pack earlier this week w/o benefit. Deneis any change to dietary and exercise habits, medications, illnesses around 4-6 weeks ago. Baseline fatigue due to being mother of twin 39 year olds. Takes melatonin 9mg  QHS, vit D 8000 units Qday, iron 65mg  BID. Denies unintentional wt loss, swollen lymphnodes, fevers, dark tarry stools.  Taking PRN ~ .25mg  a couple times a weeks. Wellbutrin and lexapro daily  Increased lamictal 8 wks ago.    Past Medical History  Diagnosis Date  . Blood transfusion 1990's  . Thrombocytosis   . Migraine   . Sleep apnea   . Allergy     Hay fever/exercise induced asthma  . Iron deficiency anemia 1990's    negative work up as to etiology; required iron infusion, blood transfusion.  Saw an hematologist  at [facility].   Marland Kitchen Anxiety   . Depression     bipolar  . GERD (gastroesophageal reflux disease)   . Hyperlipidemia   . Hypertension    Past Surgical History  Procedure Laterality Date  . Nasal septum surgery  1983  . Tonsillectomy  1966  . Cesarean section  2009  . Colonoscopy  06/2012    neg. with [place]   Family History  Problem Relation Age of Onset  . Colon cancer Paternal Uncle 9  . Colon cancer Paternal Uncle 59  . Prostate cancer Mother 46  . Breast cancer Paternal Aunt    History  Substance Use Topics  . Smoking status: Never Smoker   . Smokeless tobacco: Never Used  . Alcohol  Use: No   OB History   Grav Para Term Preterm Abortions TAB SAB Ect Mult Living                 Review of Systems Per HPI with all other pertinent systems negative.   Allergies  Review of patient's allergies indicates no known allergies.  Home Medications   Prior to Admission medications   Medication Sig Start Date End Date Taking? Authorizing Provider  ALPRAZolam Duanne Moron) 0.5 MG tablet Take 0.5 mg by mouth as needed.   Yes Historical Provider, MD  buPROPion (WELLBUTRIN SR) 200 MG 12 hr tablet Take 200 mg by mouth daily.   Yes Historical Provider, MD  butalbital-acetaminophen-caffeine (FIORICET, ESGIC) 50-325-40 MG per tablet Take 1 tablet by mouth as needed. pain   Yes Historical Provider, MD  escitalopram (LEXAPRO) 20 MG tablet Take 20 mg by mouth daily.   Yes Historical Provider, MD  esomeprazole (NEXIUM) 40 MG capsule Take 1 capsule (40 mg total) by mouth 2 (two) times daily. 01/14/14  Yes Unk Pinto, MD  estradiol-norethindrone (ACTIVELLA) 1-0.5 MG per tablet Take 1 tablet by mouth daily. 01/14/14  Yes Unk Pinto, MD  Iron 66 MG TABS Take 1 tablet by mouth 2 (two) times daily.   Yes Historical Provider, MD  lamoTRIgine (LAMICTAL) 150 MG tablet Take 150 mg by mouth daily.   Yes Historical Provider, MD  loratadine (CLARITIN) 10 MG tablet  Take 1 tablet (10 mg total) by mouth daily. 05/23/14  Yes Vicie Mutters, PA-C  Melatonin 3 MG CAPS Take 1 capsule by mouth at bedtime.   Yes Historical Provider, MD  mometasone (NASONEX) 50 MCG/ACT nasal spray Place 2 sprays into the nose daily. 05/23/14 05/23/15 Yes Vicie Mutters, PA-C  montelukast (SINGULAIR) 10 MG tablet Take 1 tablet (10 mg total) by mouth at bedtime. 01/14/14  Yes Unk Pinto, MD  Multiple Vitamins-Minerals (MULTIVITAMIN WITH MINERALS) tablet Take 1 tablet by mouth daily.   Yes Historical Provider, MD  pravastatin (PRAVACHOL) 40 MG tablet Take 1 tablet (40 mg total) by mouth daily. 01/14/14  Yes Unk Pinto, MD   BP  123/77  Pulse 76  Temp(Src) 98.3 F (36.8 C) (Oral)  Resp 18  SpO2 97%  LMP 03/12/2012 Physical Exam  Constitutional: She is oriented to person, place, and time. She appears well-developed and well-nourished. No distress.  HENT:  Head: Normocephalic and atraumatic.  Eyes: EOM are normal. Pupils are equal, round, and reactive to light.  Neck: Normal range of motion. Neck supple.  Cardiovascular: Normal rate, normal heart sounds and intact distal pulses.   Pulmonary/Chest: Effort normal and breath sounds normal.  Abdominal: Bowel sounds are normal.  Musculoskeletal: Normal range of motion. She exhibits no edema and no tenderness.  Neurological: She is oriented to person, place, and time. No cranial nerve deficit. Coordination normal.  Skin: Skin is warm. She is not diaphoretic.  Psychiatric: She has a normal mood and affect. Her behavior is normal. Judgment and thought content normal.    ED Course  Procedures (including critical care time) Labs Review Labs Reviewed  FOLATE    Imaging Review No results found.   MDM   1. Other fatigue   2. Polypharmacy    pts condition likely due to multifactorial causes - polypharmacy, too much sleep, worsening depression, vit D or lamictal overdosing, innactivity, etc.  Pt on numerous medications that can cause these symptoms such as melatonin, lamictal, lexapro, wellbutrin, Xanax, fioricet.  Full review of labs r/o many typical causes such as anemia or B12 deficiency. Labs nml w/ exception of Vit D above nml at 107.  Pts lamictal increased a couple weeks before symptoms started. While not typically done, in this case I think is important to check a level.  Folate level ordered.  Pt to call and discuss symptoms w/ psych and posibility of decreasing dose Pt to seek immediate medical attention if needed Pt also to try to eat better, get regular exercise, and avoid daytime naps in order to get 8-9hrs of sleep continuously at night only.    Precautions given and all questions answered  Linna Darner, MD Family Medicine 06/05/2014, 6:35 PM    Waldemar Dickens, MD 06/05/14 [phone]

## 2014-06-07 LAB — LAMOTRIGINE LEVEL: Lamotrigine Lvl: 3 ug/mL — ABNORMAL LOW (ref 4.0–18.0)

## 2014-06-07 NOTE — ED Notes (Signed)
Patient called to discuss her lab reports. After verifying ID, discussed her lamictil level is lower than expected. She will contact her psychiatrist to discuss dosing

## 2014-06-10 ENCOUNTER — Encounter (HOSPITAL_COMMUNITY): Payer: Self-pay | Admitting: Emergency Medicine

## 2014-06-10 ENCOUNTER — Emergency Department (INDEPENDENT_AMBULATORY_CARE_PROVIDER_SITE_OTHER)
Admission: EM | Admit: 2014-06-10 | Discharge: 2014-06-10 | Disposition: A | Discharge status: Home / Self Care | Payer: Self-pay | Source: Home / Self Care

## 2014-06-10 DIAGNOSIS — R42 Dizziness and giddiness: Secondary | ICD-10-CM

## 2014-06-10 DIAGNOSIS — Z789 Other specified health status: Secondary | ICD-10-CM

## 2014-06-10 DIAGNOSIS — Z9189 Other specified personal risk factors, not elsewhere classified: Secondary | ICD-10-CM

## 2014-06-10 MED ORDER — MECLIZINE HCL 50 MG PO TABS: 25.0000 mg | ORAL_TABLET | Freq: Two times a day (BID) | ORAL | Status: DC | PRN

## 2014-06-10 NOTE — Discharge Instructions (Signed)
The cause of your symptoms is not clear but is likely due to one or more of your medictions Continue to make gradual changes as necessary Please use the antivert for the feelings of dizziness. Make sure to continue to increase your exercise, change your diet, and practice good sleep hygiene.  Please follow up with your regular doctor as previously discussed.  Best of luck

## 2014-06-10 NOTE — ED Provider Notes (Signed)
CSN: [id number]     Arrival date & time 06/10/14  1031 History   None    Chief Complaint  Patient presents with  . Follow-up   (Consider location/radiation/quality/duration/timing/severity/associated sxs/prior Treatment) HPI  Fatigue: improved since last appoinemtent here. Decreased lamictal from 200 to 175. Psych told pt to go to 100 but she felt this was too drastic of a change. Also decreased Melatonin to 6mg  and Vit D to 4000units. Continues to have diziness that is not vertigo. Comes on randomly and will last sometimes the entire day. Associated w/ feeling off. Denies nausea or hearing loss. Statrted 4 wks ago.     Past Medical History  Diagnosis Date  . Blood transfusion 1990's  . Thrombocytosis   . Migraine   . Sleep apnea   . Allergy     Hay fever/exercise induced asthma  . Iron deficiency anemia 1990's    negative work up as to etiology; required iron infusion, blood transfusion.  Saw an hematologist  at [facility].   Marland Kitchen Anxiety   . Depression     bipolar  . GERD (gastroesophageal reflux disease)   . Hyperlipidemia   . Hypertension    Past Surgical History  Procedure Laterality Date  . Nasal septum surgery  1983  . Tonsillectomy  1966  . Cesarean section  2009  . Colonoscopy  06/2012    neg. with [facility]   Family History  Problem Relation Age of Onset  . Colon cancer Paternal Uncle 26  . Colon cancer Paternal Uncle 29  . Prostate cancer Mother 50  . Breast cancer Paternal Aunt    History  Substance Use Topics  . Smoking status: Never Smoker   . Smokeless tobacco: Never Used  . Alcohol Use: No   OB History   Grav Para Term Preterm Abortions TAB SAB Ect Mult Living                 Review of Systems Per HPI with all other pertinent systems negative.   Allergies  Review of patient's allergies indicates no known allergies.  Home Medications   Prior to Admission medications   Medication Sig Start Date End Date Taking? Authorizing Provider  ALPRAZolam  Duanne Moron) 0.5 MG tablet Take 0.5 mg by mouth as needed.   Yes Historical Provider, MD  buPROPion (WELLBUTRIN SR) 200 MG 12 hr tablet Take 200 mg by mouth daily.   Yes Historical Provider, MD  butalbital-acetaminophen-caffeine (FIORICET, ESGIC) 50-325-40 MG per tablet Take 1 tablet by mouth as needed. pain   Yes Historical Provider, MD  cholecalciferol (VITAMIN D) 1000 UNITS tablet Take 4,000 Units by mouth daily.   Yes Historical Provider, MD  escitalopram (LEXAPRO) 20 MG tablet Take 20 mg by mouth daily.   Yes Historical Provider, MD  esomeprazole (NEXIUM) 40 MG capsule Take 1 capsule (40 mg total) by mouth 2 (two) times daily. 01/14/14  Yes Unk Pinto, MD  estradiol-norethindrone (ACTIVELLA) 1-0.5 MG per tablet Take 1 tablet by mouth daily. 01/14/14  Yes Unk Pinto, MD  Iron 66 MG TABS Take 1 tablet by mouth 2 (two) times daily.   Yes Historical Provider, MD  lamoTRIgine (LAMICTAL) 150 MG tablet Take 150 mg by mouth daily.   Yes Historical Provider, MD  loratadine (CLARITIN) 10 MG tablet Take 1 tablet (10 mg total) by mouth daily. 05/23/14  Yes Vicie Mutters, PA-C  Melatonin 3 MG CAPS Take 1 capsule by mouth at bedtime.   Yes Historical Provider, MD  mometasone (NASONEX) 50  MCG/ACT nasal spray Place 2 sprays into the nose daily. 05/23/14 05/23/15 Yes Vicie Mutters, PA-C  montelukast (SINGULAIR) 10 MG tablet Take 1 tablet (10 mg total) by mouth at bedtime. 01/14/14  Yes Unk Pinto, MD  Multiple Vitamins-Minerals (MULTIVITAMIN WITH MINERALS) tablet Take 1 tablet by mouth daily.   Yes Historical Provider, MD  pravastatin (PRAVACHOL) 40 MG tablet Take 1 tablet (40 mg total) by mouth daily. 01/14/14  Yes Unk Pinto, MD  meclizine (ANTIVERT) 50 MG tablet Take 0.5-1 tablets (25-50 mg total) by mouth 2 (two) times daily as needed. 06/10/14   Waldemar Dickens, MD   BP 141/84  Pulse 72  Temp(Src) 98.5 F (36.9 C) (Oral)  Resp 16  SpO2 99%  LMP 03/12/2012 Physical Exam  Constitutional: She is  oriented to person, place, and time. She appears well-developed and well-nourished.  HENT:  Head: Normocephalic and atraumatic.  Eyes: EOM are normal. Pupils are equal, round, and reactive to light.  Neck: Normal range of motion.  Pulmonary/Chest: Effort normal. No respiratory distress.  Musculoskeletal: Normal range of motion.  Neurological: She is alert and oriented to person, place, and time.  Skin: Skin is warm.  Psychiatric: She has a normal mood and affect. Her behavior is normal. Judgment and thought content normal.    ED Course  Procedures (including critical care time) Labs Review Labs Reviewed - No data to display  Imaging Review No results found.   MDM   1. Dizziness   2. At risk for polypharmacy    Lamictal level 3  Continue decreasing lamictal dose. Discussed slow self titration Discussed polypharmacy and need to decrease other medications as well.  Antivert prn dizziness.  F/u pcp. Recommended other lifestyle changes  Spent >30 min in direct pt care and coordination.   Linna Darner, MD Family Medicine 06/10/2014, 1:25 PM      Waldemar Dickens, MD 06/10/14 [phone]

## 2014-06-10 NOTE — ED Notes (Signed)
Pt following up from her visit on 06/05/14 to discuss her lab results and what to do next.

## 2014-07-04 ENCOUNTER — Ambulatory Visit: Payer: Self-pay | Admitting: Physician Assistant

## 2014-07-04 ENCOUNTER — Ambulatory Visit (INDEPENDENT_AMBULATORY_CARE_PROVIDER_SITE_OTHER): Admitting: Physician Assistant

## 2014-07-04 ENCOUNTER — Encounter: Payer: Self-pay | Admitting: Physician Assistant

## 2014-07-04 VITALS — BP 120/78 | HR 72 | Temp 97.7°F | Resp 16 | Ht 66.0 in | Wt 170.0 lb

## 2014-07-04 DIAGNOSIS — M2550 Pain in unspecified joint: Secondary | ICD-10-CM

## 2014-07-04 DIAGNOSIS — R42 Dizziness and giddiness: Secondary | ICD-10-CM

## 2014-07-04 DIAGNOSIS — Z23 Encounter for immunization: Secondary | ICD-10-CM

## 2014-07-04 LAB — CBC WITH DIFFERENTIAL/PLATELET
Basophils Absolute: 0.1 10*3/uL (ref 0.0–0.1)
Basophils Relative: 1 % (ref 0–1)
Eosinophils Absolute: 0.2 10*3/uL (ref 0.0–0.7)
Eosinophils Relative: 4 % (ref 0–5)
HCT: 43.5 % (ref 36.0–46.0)
Hemoglobin: 14.3 g/dL (ref 12.0–15.0)
Lymphocytes Relative: 34 % (ref 12–46)
Lymphs Abs: 1.8 10*3/uL (ref 0.7–4.0)
MCH: 31 pg (ref 26.0–34.0)
MCHC: 32.9 g/dL (ref 30.0–36.0)
MCV: 94.4 fL (ref 78.0–100.0)
Monocytes Absolute: 0.5 10*3/uL (ref 0.1–1.0)
Monocytes Relative: 9 % (ref 3–12)
Neutro Abs: 2.7 10*3/uL (ref 1.7–7.7)
Neutrophils Relative %: 52 % (ref 43–77)
Platelets: 475 10*3/uL — ABNORMAL HIGH (ref 150–400)
RBC: 4.61 MIL/uL (ref 3.87–5.11)
RDW: 13.8 % (ref 11.5–15.5)
WBC: 5.2 10*3/uL (ref 4.0–10.5)

## 2014-07-04 LAB — COMPREHENSIVE METABOLIC PANEL
ALT: 12 U/L (ref 0–35)
AST: 13 U/L (ref 0–37)
Albumin: 4.5 g/dL (ref 3.5–5.2)
Alkaline Phosphatase: 54 U/L (ref 39–117)
BUN: 17 mg/dL (ref 6–23)
CO2: 25 mEq/L (ref 19–32)
Calcium: 9.6 mg/dL (ref 8.4–10.5)
Chloride: 105 mEq/L (ref 96–112)
Creat: 0.75 mg/dL (ref 0.50–1.10)
Glucose, Bld: 88 mg/dL (ref 70–99)
Potassium: 4.5 mEq/L (ref 3.5–5.3)
Sodium: 139 mEq/L (ref 135–145)
Total Bilirubin: 0.2 mg/dL (ref 0.2–1.2)
Total Protein: 6.9 g/dL (ref 6.0–8.3)

## 2014-07-04 MED ORDER — MOMETASONE FUROATE 50 MCG/ACT NA SUSP: 2.0000 | Freq: Every day | NASAL | Status: DC

## 2014-07-04 MED ORDER — BUTALBITAL-APAP-CAFFEINE 50-325-40 MG PO TABS: 1.0000 | ORAL_TABLET | ORAL | Status: DC | PRN

## 2014-07-04 NOTE — Patient Instructions (Signed)
Nail fungus is very common and very difficult to treat. Sometime if caught early topical treatment can work. You can try terbinafine over the counter, apply twice daily for 1-3 months to your toenails. If this does not work there is a once a day oral medicine that you have to take for 3 months. Sometime this medication may have to be repeated. It is processed through your liver so we often ask patients to come back for a liver function test and to report any AB pain, yellowing of the skin, or fluid retention. If the topical treatment does not work for you we can start you on the pill.   What is the TMJ? The temporomandibular (tem-PUH-ro-man-DIB-yoo-ler) joint, or the TMJ, connects the upper and lower jawbones. This joint allows the jaw to open wide and move back and forth when you chew, talk, or yawn.There are also several muscles that help this joint move. There can be muscle tightness and pain in the muscle that can cause several symptoms.  What causes TMJ pain? There are many causes of TMJ pain. Repeated chewing (for example, chewing gum) and clenching your teeth can cause pain in the joint. Some TMJ pain has no obvious cause. What can I do to ease the pain? There are many things you can do to help your pain get better. When you have pain:  Eat soft foods and stay away from chewy foods (for example, taffy) Try to use both sides of your mouth to chew Don't chew gum Don't open your mouth wide (for example, during yawning or singing) Don't bite your cheeks or fingernails Lower your amount of stress and worry Applying a warm, damp washcloth to the joint may help. Over-the-counter pain medicines such as ibuprofen (one brand: Advil) or acetaminophen (one brand: Tylenol) might also help. Do not use these medicines if you are allergic to them or if your doctor told you not to use them. How can I stop the pain from coming back? When your pain is better, you can do these exercises to make your muscles  stronger and to keep the pain from coming back:  Resisted mouth opening: Place your thumb or two fingers under your chin and open your mouth slowly, pushing up lightly on your chin with your thumb. Hold for three to six seconds. Close your mouth slowly. Resisted mouth closing: Place your thumbs under your chin and your two index fingers on the ridge between your mouth and the bottom of your chin. Push down lightly on your chin as you close your mouth. Tongue up: Slowly open and close your mouth while keeping the tongue touching the roof of the mouth. Side-to-side jaw movement: Place an object about one fourth of an inch thick (for example, two tongue depressors) between your front teeth. Slowly move your jaw from side to side. Increase the thickness of the object as the exercise becomes easier Forward jaw movement: Place an object about one fourth of an inch thick between your front teeth and move the bottom jaw forward so that the bottom teeth are in front of the top teeth. Increase the thickness of the object as the exercise becomes easier. These exercises should not be painful. If it hurts to do these exercises, stop doing them and talk to your family doctor.

## 2014-07-04 NOTE — Progress Notes (Signed)
   Subjective:    Patient ID: Bonnie Hall, female    DOB: 08/01/1961, 52 y.o.   MRN: [id number]  HPI 53 y.o. female with history of biopolar, labile HTN, hyperlipidemia, presents for ear pain/follow up. She went to the ER for dizziness, changes in vision, fatigue, nausea, and went to the ER, they thought it was polypharmacy/lamictal, she has decreased her dose, at the same time she was having left and hip pain. She is off wellbutrin now, and on 175 for another 2 weeks and going to 150 at that time.   Patient is still complaining of ear pain, voice hoarseness, cough was productive, no dry. She was on zpak.    Review of Systems  Constitutional: Positive for fatigue. Negative for fever, chills, diaphoresis, activity change, appetite change and unexpected weight change.  HENT: Positive for congestion, ear pain and voice change. Negative for dental problem, drooling, ear discharge, facial swelling, hearing loss, mouth sores, nosebleeds, postnasal drip, rhinorrhea, sinus pressure, sneezing, sore throat and tinnitus.   Eyes: Positive for visual disturbance. Negative for photophobia, pain, discharge, redness and itching.  Respiratory: Negative.   Cardiovascular: Negative.   Gastrointestinal: Positive for nausea. Negative for abdominal pain.  Genitourinary: Negative.   Musculoskeletal: Positive for arthralgias and myalgias. Negative for back pain, gait problem and joint swelling.  Neurological: Positive for dizziness and headaches. Negative for tremors, seizures, syncope, facial asymmetry, speech difficulty, weakness, light-headedness and numbness.       Objective:   Physical Exam  Constitutional: She is oriented to person, place, and time. She appears well-developed and well-nourished.  HENT:  Head: Normocephalic and atraumatic.  Right Ear: External ear normal.  Left Ear: External ear normal.  Nose: Nose normal.  Mouth/Throat: Oropharynx is clear and moist.  + TMJ tenderness bilateral  R>L  Eyes: Conjunctivae are normal. Pupils are equal, round, and reactive to light.  Neck: Normal range of motion. Neck supple.  Cardiovascular: Normal rate and regular rhythm.   Pulmonary/Chest: Effort normal and breath sounds normal.  Abdominal: Soft. Bowel sounds are normal.  Musculoskeletal: Normal range of motion.  Lymphadenopathy:    She has no cervical adenopathy.  Neurological: She is alert and oriented to person, place, and time. She has normal reflexes. No cranial nerve deficit.  Skin: Skin is warm and dry.  Bilateral toes with fungus, white superficial       Assessment & Plan:  Headache/polyarthralgias- check lyme, [place], and ESR. If CBC is elevated will send in ABX TMJ-information given to the patient, no gum/decrease hard foods, warm wet wash clothes, decrease stress, talk with dentist about possible night guard, can do massage, and exercise.  Polypharmacy- continue decreasing lamictal per psych Toenail fungus- superficial- try OTC cream first

## 2014-07-05 LAB — ROCKY MTN SPOTTED FVR ABS PNL(IGG+IGM)
RMSF IgG: 0.15 IV
RMSF IgM: 0.12 IV

## 2014-07-05 LAB — SEDIMENTATION RATE: Sed Rate: 1 mm/hr (ref 0–22)

## 2014-07-08 LAB — LYME ABY, WSTRN BLT IGG & IGM W/BANDS

## 2014-07-18 ENCOUNTER — Telehealth: Payer: Self-pay | Admitting: Physician Assistant

## 2014-07-18 MED ORDER — ALBENDAZOLE 200 MG PO TABS: ORAL_TABLET | ORAL | Status: DC

## 2014-07-18 NOTE — Telephone Encounter (Signed)
Patient called stating that daughter was treated for pinworms and she should also be treated. Medication sent into the pharmacy.   Meds ordered this encounter  Medications  . albendazole (ALBENZA) 200 MG tablet    Sig: Take 2 tablets once and repeat in 2 weeks.    Dispense:  4 tablet    Refill:  0

## 2014-07-19 ENCOUNTER — Telehealth: Payer: Self-pay

## 2014-07-19 MED ORDER — IVERMECTIN 3 MG PO TABS: 150.0000 ug/kg | ORAL_TABLET | Freq: Once | ORAL | Status: DC

## 2014-07-19 NOTE — Telephone Encounter (Signed)
Patient called and said Rx that was called into pharmacy to treat pinworms was $160. Patient requesting Ivermectin 3mg .

## 2014-08-15 ENCOUNTER — Other Ambulatory Visit: Payer: Self-pay

## 2014-08-15 MED ORDER — BUTALBITAL-APAP-CAFFEINE 50-325-40 MG PO TABS: ORAL_TABLET | ORAL | Status: DC

## 2014-08-22 ENCOUNTER — Encounter: Payer: Self-pay | Admitting: Physician Assistant

## 2014-08-22 ENCOUNTER — Ambulatory Visit (INDEPENDENT_AMBULATORY_CARE_PROVIDER_SITE_OTHER): Admitting: Physician Assistant

## 2014-08-22 ENCOUNTER — Ambulatory Visit: Payer: Self-pay | Admitting: Internal Medicine

## 2014-08-22 VITALS — BP 124/82 | HR 74 | Temp 98.1°F | Resp 16 | Ht 66.0 in | Wt 173.0 lb

## 2014-08-22 DIAGNOSIS — D509 Iron deficiency anemia, unspecified: Secondary | ICD-10-CM

## 2014-08-22 DIAGNOSIS — D75839 Thrombocytosis, unspecified: Secondary | ICD-10-CM

## 2014-08-22 DIAGNOSIS — E785 Hyperlipidemia, unspecified: Secondary | ICD-10-CM

## 2014-08-22 DIAGNOSIS — M2550 Pain in unspecified joint: Secondary | ICD-10-CM

## 2014-08-22 DIAGNOSIS — G43809 Other migraine, not intractable, without status migrainosus: Secondary | ICD-10-CM

## 2014-08-22 DIAGNOSIS — R5383 Other fatigue: Secondary | ICD-10-CM

## 2014-08-22 DIAGNOSIS — D473 Essential (hemorrhagic) thrombocythemia: Secondary | ICD-10-CM

## 2014-08-22 DIAGNOSIS — R42 Dizziness and giddiness: Secondary | ICD-10-CM

## 2014-08-22 DIAGNOSIS — R0989 Other specified symptoms and signs involving the circulatory and respiratory systems: Secondary | ICD-10-CM

## 2014-08-22 DIAGNOSIS — F319 Bipolar disorder, unspecified: Secondary | ICD-10-CM

## 2014-08-22 DIAGNOSIS — I10 Essential (primary) hypertension: Secondary | ICD-10-CM

## 2014-08-22 DIAGNOSIS — Z79899 Other long term (current) drug therapy: Secondary | ICD-10-CM

## 2014-08-22 DIAGNOSIS — E559 Vitamin D deficiency, unspecified: Secondary | ICD-10-CM

## 2014-08-22 LAB — RHEUMATOID FACTOR: Rhuematoid fact SerPl-aCnc: <10 IU/mL (ref <=14)

## 2014-08-22 LAB — CBC WITH DIFFERENTIAL/PLATELET
Basophils Absolute: 0.1 10*3/uL (ref 0.0–0.1)
Basophils Relative: 1 % (ref 0–1)
Eosinophils Absolute: 0.2 10*3/uL (ref 0.0–0.7)
Eosinophils Relative: 3 % (ref 0–5)
HCT: 41 % (ref 36.0–46.0)
Hemoglobin: 13.6 g/dL (ref 12.0–15.0)
Lymphocytes Relative: 23 % (ref 12–46)
Lymphs Abs: 1.3 10*3/uL (ref 0.7–4.0)
MCH: 30.9 pg (ref 26.0–34.0)
MCHC: 33.2 g/dL (ref 30.0–36.0)
MCV: 93.2 fL (ref 78.0–100.0)
MPV: 9.9 fL (ref 9.4–12.4)
Monocytes Absolute: 0.3 10*3/uL (ref 0.1–1.0)
Monocytes Relative: 6 % (ref 3–12)
Neutro Abs: 3.8 10*3/uL (ref 1.7–7.7)
Neutrophils Relative %: 67 % (ref 43–77)
Platelets: 460 10*3/uL — ABNORMAL HIGH (ref 150–400)
RBC: 4.4 MIL/uL (ref 3.87–5.11)
RDW: 13.8 % (ref 11.5–15.5)
WBC: 5.6 10*3/uL (ref 4.0–10.5)

## 2014-08-22 LAB — IRON AND TIBC
%SAT: 25 % (ref 20–55)
Iron: 77 ug/dL (ref 42–145)
TIBC: 305 ug/dL (ref 250–470)
UIBC: 228 ug/dL (ref 125–400)

## 2014-08-22 LAB — BASIC METABOLIC PANEL WITH GFR
BUN: 21 mg/dL (ref 6–23)
CO2: 27 mEq/L (ref 19–32)
Calcium: 9.2 mg/dL (ref 8.4–10.5)
Chloride: 105 mEq/L (ref 96–112)
Creat: 0.78 mg/dL (ref 0.50–1.10)
GFR, Est African American: >89 mL/min
GFR, Est Non African American: 88 mL/min
Glucose, Bld: 123 mg/dL — ABNORMAL HIGH (ref 70–99)
Potassium: 4.4 mEq/L (ref 3.5–5.3)
Sodium: 138 mEq/L (ref 135–145)

## 2014-08-22 LAB — HEPATIC FUNCTION PANEL
ALT: 10 U/L (ref 0–35)
AST: 12 U/L (ref 0–37)
Albumin: 4.3 g/dL (ref 3.5–5.2)
Alkaline Phosphatase: 48 U/L (ref 39–117)
Bilirubin, Direct: 0.1 mg/dL (ref 0.0–0.3)
Indirect Bilirubin: 0.2 mg/dL (ref 0.2–1.2)
Total Bilirubin: 0.3 mg/dL (ref 0.2–1.2)
Total Protein: 6.4 g/dL (ref 6.0–8.3)

## 2014-08-22 LAB — LIPID PANEL
Cholesterol: 168 mg/dL (ref 0–200)
HDL: 55 mg/dL (ref >39)
LDL Cholesterol: 88 mg/dL (ref 0–99)
Total CHOL/HDL Ratio: 3.1 Ratio
Triglycerides: 126 mg/dL (ref <150)
VLDL: 25 mg/dL (ref 0–40)

## 2014-08-22 LAB — FERRITIN: Ferritin: 57 ng/mL (ref 10–291)

## 2014-08-22 LAB — CK: Total CK: 70 U/L (ref 7–177)

## 2014-08-22 LAB — URIC ACID: Uric Acid, Serum: 2.5 mg/dL (ref 2.4–7.0)

## 2014-08-22 LAB — VITAMIN B12: Vitamin B-12: 429 pg/mL (ref 211–911)

## 2014-08-22 LAB — MAGNESIUM: Magnesium: 1.8 mg/dL (ref 1.5–2.5)

## 2014-08-22 LAB — TSH: TSH: 1.366 u[IU]/mL (ref 0.350–4.500)

## 2014-08-22 NOTE — Progress Notes (Signed)
Assessment and Plan:  Hypertension: Continue medication, monitor blood pressure at home. Continue DASH diet.  Reminder to go to the ER if any CP, SOB, nausea, dizziness, severe HA, changes vision/speech, left arm numbness and tingling, and jaw pain. Cholesterol: Continue diet and exercise. Check cholesterol.  Pre-diabetes-Continue diet and exercise. Check A1C Vitamin D Def- check level and continue medications.  Polyarthralgias- normal exam- does have family history of RF- will check CPK, rule out autoimmune-? OA- will get Xrays, continue aleve/pain meds- more information pending labs- ? FM/depression element as well.   Continue diet and meds as discussed. Further disposition pending results of labs. OVER 40 minutes of exam, counseling, chart review, referral performed   HPI 53 y.o. female  presents for 3 month follow up with hypertension, hyperlipidemia, prediabetes and vitamin D. Her blood pressure has been controlled at home, today their BP is BP: 124/82 mmHg She does not workout. She denies chest pain, shortness of breath, dizziness.  She is on cholesterol medication and is having myalgias. Her cholesterol is at goal. The cholesterol last visit was:   Lab Results  Component Value Date   CHOL 158 05/23/2014   HDL 46 05/23/2014   LDLCALC 84 05/23/2014   TRIG 138 05/23/2014   CHOLHDL 3.4 05/23/2014   She has been working on diet and exercise for prediabetes, and denies paresthesia of the feet, polydipsia, polyuria and visual disturbances. Last A1C in the office was:  Lab Results  Component Value Date   HGBA1C 5.6 01/14/2014   Patient is on Vitamin D supplement.   Lab Results  Component Value Date   VD25OH 106* 05/23/2014     Last visit she was having vision changes, fatigue, nasuea and dizziness. She went to the ER thought it was polypharmacy lamictal, dosage of lamictal has decreased. Most of her symptoms have resolved except that she has bilateral ankle, knee pain, lower  back/hips, shoulder and elbows. Took two of her husbands hydrocodone which helped, aleve does not help. She had a neg lyme and RSMF and sed rate last visit.  Pain is worse with crossing legs, worse with squatting to standing. States they are stiff in the morning for 5-10 mins, denies swelling, redness.   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  MRI brain normal  03/2011  Current Medications:  Current Outpatient Prescriptions on File Prior to Visit  Medication Sig Dispense Refill  . ALPRAZolam (XANAX) 0.5 MG tablet Take 0.5 mg by mouth as needed.    . butalbital-acetaminophen-caffeine (FIORICET, ESGIC) 50-325-40 MG per tablet Take one tablet every 12 hours as needed for pain 100 tablet 0  . cholecalciferol (VITAMIN D) 1000 UNITS tablet Take 4,000 Units by mouth daily.    Marland Kitchen escitalopram (LEXAPRO) 20 MG tablet Take 20 mg by mouth daily.    Marland Kitchen esomeprazole (NEXIUM) 40 MG capsule Take 1 capsule (40 mg total) by mouth 2 (two) times daily. 180 capsule 4  . estradiol-norethindrone (ACTIVELLA) 1-0.5 MG per tablet Take 1 tablet by mouth daily. 90 tablet 4  . Iron 66 MG TABS Take 1 tablet by mouth 2 (two) times daily.    Marland Kitchen ivermectin (STROMECTOL) 3 MG TABS tablet Take 4 tablets (12,000 mcg total) by mouth once. Repeat in 10 days 4 tablet 1  . loratadine (CLARITIN) 10 MG tablet Take 1 tablet (10 mg total) by mouth daily. 90 tablet 6  . meclizine (ANTIVERT) 50 MG tablet Take 0.5-1 tablets (25-50 mg total) by mouth 2 (  two) times daily as needed. 30 tablet 0  . Melatonin 3 MG CAPS Take 6 capsules by mouth at bedtime.     . mometasone (NASONEX) 50 MCG/ACT nasal spray Place 2 sprays into the nose daily. 17 g 2  . montelukast (SINGULAIR) 10 MG tablet Take 1 tablet (10 mg total) by mouth at bedtime. 90 tablet 4  . Multiple Vitamins-Minerals (MULTIVITAMIN WITH MINERALS) tablet Take 1 tablet by mouth daily.    . pravastatin (PRAVACHOL) 40 MG tablet Take 1 tablet (40 mg  total) by mouth daily. 90 tablet 4  . albendazole (ALBENZA) 200 MG tablet Take 2 tablets once and repeat in 2 weeks. (Patient not taking: Reported on 08/22/2014) 4 tablet 0   No current facility-administered medications on file prior to visit.   Medical History:  Past Medical History  Diagnosis Date  . Blood transfusion 1990's  . Thrombocytosis   . Migraine   . Sleep apnea   . Allergy     Hay fever/exercise induced asthma  . Iron deficiency anemia 1990's    negative work up as to etiology; required iron infusion, blood transfusion.  Saw an hematologist  at [facility].   Marland Kitchen Anxiety   . Depression     bipolar  . GERD (gastroesophageal reflux disease)   . Hyperlipidemia   . Hypertension    Allergies: No Known Allergies   Review of Systems:  Review of Systems  Constitutional: Positive for malaise/fatigue. Negative for fever, chills, weight loss and diaphoresis.  HENT: Positive for congestion. Negative for ear discharge, ear pain, hearing loss, nosebleeds, sore throat and tinnitus.   Eyes: Negative.   Respiratory: Negative.  Negative for stridor.   Cardiovascular: Negative.   Gastrointestinal: Positive for nausea. Negative for heartburn, vomiting, abdominal pain, diarrhea, constipation, blood in stool and melena.  Genitourinary: Negative.   Musculoskeletal: Positive for myalgias, back pain and joint pain. Negative for falls and neck pain.  Skin: Negative for itching and rash.  Neurological: Positive for headaches. Negative for dizziness, tingling, tremors, sensory change, speech change, focal weakness, seizures, loss of consciousness and weakness.  Psychiatric/Behavioral: Positive for depression. Negative for suicidal ideas, hallucinations, memory loss and substance abuse. The patient is not nervous/anxious and does not have insomnia.      Family history- Review and unchanged Social history- Review and unchanged Physical Exam: BP 124/82 mmHg  Pulse 74  Temp(Src) 98.1 F (36.7 C)   Resp 16  Ht 5\' 6"  (1.676 m)  Wt 173 lb (78.472 kg)  BMI 27.94 kg/m2  LMP 03/12/2012 Wt Readings from Last 3 Encounters:  08/22/14 173 lb (78.472 kg)  07/04/14 170 lb (77.111 kg)  05/23/14 169 lb (76.658 kg)   General Appearance: Well nourished, in no apparent distress. Eyes: PERRLA, EOMs, conjunctiva no swelling or erythema Sinuses: No Frontal/maxillary tenderness ENT/Mouth: Ext aud canals clear, TMs without erythema, bulging. No erythema, swelling, or exudate on post pharynx.  Tonsils not swollen or erythematous. Hearing normal.  Neck: Supple, thyroid normal.  Respiratory: Respiratory effort normal, BS equal bilaterally without rales, rhonchi, wheezing or stridor.  Cardio: RRR with no MRGs. Brisk peripheral pulses without edema.  Abdomen: Soft, + BS.  Non tender, no guarding, rebound, hernias, masses. Lymphatics: Non tender without lymphadenopathy.  Musculoskeletal: Full ROM, 5/5 strength, normal gait.  Skin: Warm, dry without rashes, lesions, ecchymosis.  Neuro: Cranial nerves intact. Normal muscle tone, no cerebellar symptoms. Sensation intact.  Psych: Awake and oriented X 3, normal affect, Insight and Judgment appropriate.  Vicie Mutters, PA-C 10:04 AM [facility]

## 2014-08-23 LAB — ANTI-DNA ANTIBODY, DOUBLE-STRANDED: ds DNA Ab: 1 IU/mL

## 2014-08-23 LAB — CYCLIC CITRUL PEPTIDE ANTIBODY, IGG: Cyclic Citrullin Peptide Ab: <2 U/mL (ref 0.0–5.0)

## 2014-08-23 LAB — C3 AND C4
C3 Complement: 125 mg/dL (ref 90–180)
C4 Complement: 28 mg/dL (ref 10–40)

## 2014-08-23 LAB — VITAMIN D 25 HYDROXY (VIT D DEFICIENCY, FRACTURES): Vit D, 25-Hydroxy: 49 ng/mL (ref 30–100)

## 2014-08-23 LAB — FOLATE RBC: RBC Folate: 553 ng/mL (ref >280)

## 2014-08-23 LAB — ANA: Anti Nuclear Antibody(ANA): NEGATIVE

## 2014-08-26 LAB — HISTONE ANTIBODIES, IGG, BLOOD: Histone Ab: 0.6 U (ref <1.0)

## 2014-08-26 LAB — HLA-B27 ANTIGEN: DNA Result:: NOT DETECTED

## 2014-09-03 ENCOUNTER — Other Ambulatory Visit: Payer: Self-pay | Admitting: Physician Assistant

## 2014-09-03 ENCOUNTER — Telehealth: Payer: Self-pay | Admitting: *Deleted

## 2014-09-03 MED ORDER — TRAMADOL HCL 50 MG PO TABS: 50.0000 mg | ORAL_TABLET | Freq: Two times a day (BID) | ORAL | Status: DC | PRN

## 2014-09-03 NOTE — Telephone Encounter (Signed)
Patient called requesting Rx for lower back pain.  No relief with using OTC Aleve or ibuprofen.  States she took husband's Hydorcodone Rx and that helped relieve symptoms.  Per Vicie Mutters, PA-C orders advised patient that Tramadol will be called into [facility] and if no relief f/u in office for further evaluation.

## 2014-10-28 ENCOUNTER — Other Ambulatory Visit: Payer: Self-pay | Admitting: Physician Assistant

## 2014-11-05 ENCOUNTER — Other Ambulatory Visit: Payer: Self-pay

## 2014-11-05 MED ORDER — ESOMEPRAZOLE MAGNESIUM 40 MG PO CPDR: 40.0000 mg | DELAYED_RELEASE_CAPSULE | Freq: Two times a day (BID) | ORAL | Status: DC

## 2014-11-05 MED ORDER — MOMETASONE FUROATE 50 MCG/ACT NA SUSP: 2.0000 | Freq: Every day | NASAL | Status: DC

## 2014-11-05 MED ORDER — ESTRADIOL-NORETHINDRONE ACET 1-0.5 MG PO TABS: 1.0000 | ORAL_TABLET | Freq: Every day | ORAL | Status: DC

## 2014-11-05 MED ORDER — MONTELUKAST SODIUM 10 MG PO TABS: 10.0000 mg | ORAL_TABLET | Freq: Every day | ORAL | Status: DC

## 2014-11-05 MED ORDER — PRAVASTATIN SODIUM 40 MG PO TABS: 40.0000 mg | ORAL_TABLET | Freq: Every day | ORAL | Status: DC

## 2014-12-26 ENCOUNTER — Other Ambulatory Visit: Payer: Self-pay | Admitting: Obstetrics and Gynecology

## 2014-12-27 LAB — CYTOLOGY - PAP

## 2015-01-16 ENCOUNTER — Ambulatory Visit (INDEPENDENT_AMBULATORY_CARE_PROVIDER_SITE_OTHER): Admitting: Internal Medicine

## 2015-01-16 ENCOUNTER — Encounter: Payer: Self-pay | Admitting: Internal Medicine

## 2015-01-16 VITALS — BP 116/74 | HR 76 | Temp 97.7°F | Resp 16 | Ht 65.75 in | Wt 186.4 lb

## 2015-01-16 DIAGNOSIS — R5383 Other fatigue: Secondary | ICD-10-CM | POA: Diagnosis not present

## 2015-01-16 DIAGNOSIS — K219 Gastro-esophageal reflux disease without esophagitis: Secondary | ICD-10-CM

## 2015-01-16 DIAGNOSIS — E785 Hyperlipidemia, unspecified: Secondary | ICD-10-CM

## 2015-01-16 DIAGNOSIS — R7309 Other abnormal glucose: Secondary | ICD-10-CM | POA: Insufficient documentation

## 2015-01-16 DIAGNOSIS — Z111 Encounter for screening for respiratory tuberculosis: Secondary | ICD-10-CM | POA: Diagnosis not present

## 2015-01-16 DIAGNOSIS — D509 Iron deficiency anemia, unspecified: Secondary | ICD-10-CM | POA: Diagnosis not present

## 2015-01-16 DIAGNOSIS — Z79899 Other long term (current) drug therapy: Secondary | ICD-10-CM

## 2015-01-16 DIAGNOSIS — G4730 Sleep apnea, unspecified: Secondary | ICD-10-CM | POA: Diagnosis not present

## 2015-01-16 DIAGNOSIS — E559 Vitamin D deficiency, unspecified: Secondary | ICD-10-CM | POA: Diagnosis not present

## 2015-01-16 DIAGNOSIS — R0989 Other specified symptoms and signs involving the circulatory and respiratory systems: Secondary | ICD-10-CM

## 2015-01-16 DIAGNOSIS — Z1212 Encounter for screening for malignant neoplasm of rectum: Secondary | ICD-10-CM

## 2015-01-16 DIAGNOSIS — I10 Essential (primary) hypertension: Secondary | ICD-10-CM | POA: Diagnosis not present

## 2015-01-16 LAB — BASIC METABOLIC PANEL WITH GFR
BUN: 16 mg/dL (ref 6–23)
CO2: 22 mEq/L (ref 19–32)
Calcium: 9.6 mg/dL (ref 8.4–10.5)
Chloride: 104 mEq/L (ref 96–112)
Creat: 0.71 mg/dL (ref 0.50–1.10)
GFR, Est African American: >89 mL/min
GFR, Est Non African American: >89 mL/min
Glucose, Bld: 99 mg/dL (ref 70–99)
Potassium: 4.6 mEq/L (ref 3.5–5.3)
Sodium: 137 mEq/L (ref 135–145)

## 2015-01-16 LAB — CBC WITH DIFFERENTIAL/PLATELET
Basophils Absolute: 0.1 10*3/uL (ref 0.0–0.1)
Basophils Relative: 1 % (ref 0–1)
Eosinophils Absolute: 0.1 10*3/uL (ref 0.0–0.7)
Eosinophils Relative: 2 % (ref 0–5)
HCT: 42.8 % (ref 36.0–46.0)
Hemoglobin: 14.3 g/dL (ref 12.0–15.0)
Lymphocytes Relative: 21 % (ref 12–46)
Lymphs Abs: 1.3 10*3/uL (ref 0.7–4.0)
MCH: 31 pg (ref 26.0–34.0)
MCHC: 33.4 g/dL (ref 30.0–36.0)
MCV: 92.8 fL (ref 78.0–100.0)
MPV: 10.2 fL (ref 8.6–12.4)
Monocytes Absolute: 0.4 10*3/uL (ref 0.1–1.0)
Monocytes Relative: 7 % (ref 3–12)
Neutro Abs: 4.2 10*3/uL (ref 1.7–7.7)
Neutrophils Relative %: 69 % (ref 43–77)
Platelets: 447 10*3/uL — ABNORMAL HIGH (ref 150–400)
RBC: 4.61 MIL/uL (ref 3.87–5.11)
RDW: 13.2 % (ref 11.5–15.5)
WBC: 6.1 10*3/uL (ref 4.0–10.5)

## 2015-01-16 LAB — HEPATIC FUNCTION PANEL
ALT: 14 U/L (ref 0–35)
AST: 14 U/L (ref 0–37)
Albumin: 4.4 g/dL (ref 3.5–5.2)
Alkaline Phosphatase: 58 U/L (ref 39–117)
Bilirubin, Direct: 0.1 mg/dL (ref 0.0–0.3)
Indirect Bilirubin: 0.2 mg/dL (ref 0.2–1.2)
Total Bilirubin: 0.3 mg/dL (ref 0.2–1.2)
Total Protein: 7.1 g/dL (ref 6.0–8.3)

## 2015-01-16 LAB — IRON AND TIBC
%SAT: 35 % (ref 20–55)
Iron: 113 ug/dL (ref 42–145)
TIBC: 327 ug/dL (ref 250–470)
UIBC: 214 ug/dL (ref 125–400)

## 2015-01-16 LAB — LIPID PANEL
Cholesterol: 168 mg/dL (ref 0–200)
HDL: 49 mg/dL (ref >=46)
LDL Cholesterol: 94 mg/dL (ref 0–99)
Total CHOL/HDL Ratio: 3.4 Ratio
Triglycerides: 126 mg/dL (ref <150)
VLDL: 25 mg/dL (ref 0–40)

## 2015-01-16 LAB — TSH: TSH: 1.176 u[IU]/mL (ref 0.350–4.500)

## 2015-01-16 LAB — VITAMIN B12: Vitamin B-12: 799 pg/mL (ref 211–911)

## 2015-01-16 LAB — MAGNESIUM: Magnesium: 1.9 mg/dL (ref 1.5–2.5)

## 2015-01-16 LAB — HEMOGLOBIN A1C
Hgb A1c MFr Bld: 5.7 % — ABNORMAL HIGH (ref <5.7)
Mean Plasma Glucose: 117 mg/dL — ABNORMAL HIGH (ref <117)

## 2015-01-16 MED ORDER — RANITIDINE HCL 300 MG PO TABS: ORAL_TABLET | ORAL | Status: DC

## 2015-01-16 MED ORDER — ASPIRIN EC 81 MG PO TBEC: 81.0000 mg | DELAYED_RELEASE_TABLET | Freq: Every day | ORAL | Status: DC

## 2015-01-16 NOTE — Patient Instructions (Addendum)
GETTING OFF OF PPI's    Nexium/protonix/prilosec/Omeprazole/Dexilant/Aciphex are called PPI's, they are great at healing your stomach but should only be taken for a short period of time.     Recent studies have shown that taken for a long time they  can increase the risk of osteoporosis (weakening of your bones), pneumonia, low magnesium, restless legs, Cdiff (infection that causes diarrhea), DEMENTIA and most recently kidney damage / disease / insufficiency.     Due to this information we want to try to stop the PPI but if you try to stop it abruptly this can cause rebound acid and worsening symptoms.   So this is how we want you to get off the PPI:  - Start taking the nexium/protonix/prilosec/PPI  every other day with  zantac (ranitidine) 2 x a day for 2-4 weeks  - then decrease the PPI to every 3 days while taking the zantac (ranitidine) twice a day the other  days for 2-4  Weeks  - then you can try the zantac (ranitidine) once at night or up to 2 x day as needed.  - you can continue on this once at night or stop all together  - Avoid alcohol, spicy foods, NSAIDS (aleve, ibuprofen) at this time. See foods below.   +++++++++++++++++++++++++++++++++++++++++++  Food Choices for Gastroesophageal Reflux Disease  When you have gastroesophageal reflux disease (GERD), the foods you eat and your eating habits are very important. Choosing the right foods can help ease the discomfort of GERD. WHAT GENERAL GUIDELINES DO I NEED TO FOLLOW?  Choose fruits, vegetables, whole grains, low-fat dairy products, and low-fat meat, fish, and poultry.  Limit fats such as oils, salad dressings, butter, nuts, and avocado.  Keep a food diary to identify foods that cause symptoms.  Avoid foods that cause reflux. These may be different for different people.  Eat frequent small meals instead of three large meals each day.  Eat your meals slowly, in a relaxed setting.  Limit fried foods.  Cook foods  using methods other than frying.  Avoid drinking alcohol.  Avoid drinking large amounts of liquids with your meals.  Avoid bending over or lying down until 2-3 hours after eating.   WHAT FOODS ARE NOT RECOMMENDED? The following are some foods and drinks that may worsen your symptoms:  Vegetables Tomatoes. Tomato juice. Tomato and spaghetti sauce. Chili peppers. Onion and garlic. Horseradish. Fruits Oranges, grapefruit, and lemon (fruit and juice). Meats High-fat meats, fish, and poultry. This includes hot dogs, ribs, ham, sausage, salami, and bacon. Dairy Whole milk and chocolate milk. Sour cream. Cream. Butter. Ice cream. Cream cheese.  Beverages Coffee and tea, with or without caffeine. Carbonated beverages or energy drinks. Condiments Hot sauce. Barbecue sauce.  Sweets/Desserts Chocolate and cocoa. Donuts. Peppermint and spearmint. Fats and Oils High-fat foods, including [place] fries and potato chips. Other Vinegar. Strong spices, such as black pepper, white pepper, red pepper, cayenne, curry powder, cloves, ginger, and chili powder. Nexium/protonix/prilosec are called PPI's, they are great at healing your stomach but should only be taken for a short period of time.   ++++++++++++++++++++++++++++++++++  Recommend Low dose or baby Aspirin 81 mg daily   To reduce risk of Colon Cancer 20 %, Skin Cancer 26 % , Melanoma 46% and   Pancreatic cancer 60%  +++++++++++++++++++++++++++++++++ +++++++++++++++++++++++++++++++++++++++++++++++++++++++++++  Vitamin D goal is between 70-100.   Please make sure that you are taking your Vitamin D as directed.   It is very important as a natural antiinflammatory  helping hair, skin, and nails, as well as reducing stroke and heart attack risk.   It helps your bones and helps with mood.  It also decreases numerous cancer risks so please take it as directed.   +++++++++++++++++++++++++++++++++++++++++++++++++++++++++++  Recommend  the book "The END of DIETING" by Dr Excell Seltzer   & the book "The END of DIABETES " by Dr Excell Seltzer  At [email or web address] - get book & Audio CD's     Being diabetic has a  300% increased risk for heart attack, stroke, cancer, and alzheimer- type vascular dementia. It is very important that you work harder with diet by avoiding all foods that are white. Avoid white rice (brown & wild rice is OK), white potatoes (sweetpotatoes in moderation is OK), White bread or wheat bread or anything made out of white flour like bagels, donuts, rolls, buns, biscuits, cakes, pastries, cookies, pizza crust, and pasta (made from white flour & egg whites) - vegetarian pasta or spinach or wheat pasta is OK. Multigrain breads like Arnold's or [employer], or multigrain sandwich thins or flatbreads.  Diet, exercise and weight loss can reverse and cure diabetes in the early stages.  Diet, exercise and weight loss is very important in the control and prevention of complications of diabetes which affects every system in your body, ie. Brain - dementia/stroke, eyes - glaucoma/blindness, heart - heart attack/heart failure, kidneys - dialysis, stomach - gastric paralysis, intestines - malabsorption, nerves - severe painful neuritis, circulation - gangrene & loss of a leg(s), and finally cancer and Alzheimers.    I recommend avoid fried & greasy foods,  sweets/candy, white rice (brown or wild rice or Quinoa is OK), white potatoes (sweet potatoes are OK) - anything made from white flour - bagels, doughnuts, rolls, buns, biscuits,white and wheat breads, pizza crust and traditional pasta made of white flour & egg white(vegetarian pasta or spinach or wheat pasta is OK).  Multi-grain bread is OK - like multi-grain flat bread or sandwich thins. Avoid alcohol in excess. Exercise is also important.    Eat all the vegetables you want - avoid meat, especially red meat and dairy - especially cheese.  Cheese is the most concentrated form of  trans-fats which is the worst thing to clog up our arteries. Veggie cheese is OK which can be found in the fresh produce section at [facility] or [employer] or [employer]  ++++++++++++++++++++++++++++++++++++++++++++++++++++++++ Preventive Care for Adults A healthy lifestyle and preventive care can promote health and wellness. Preventive health guidelines for women include the following key practices.  A routine yearly physical is a good way to check with your health care provider about your health and preventive screening. It is a chance to share any concerns and updates on your health and to receive a thorough exam.  Visit your dentist for a routine exam and preventive care every 6 months. Brush your teeth twice a day and floss once a day. Good oral hygiene prevents tooth decay and gum disease.  The frequency of eye exams is based on your age, health, family medical history, use of contact lenses, and other factors. Follow your health care provider's recommendations for frequency of eye exams.  Eat a healthy diet. Foods like vegetables, fruits, whole grains, low-fat dairy products, and lean protein foods contain the nutrients you need without too many calories. Decrease your intake of foods high in solid fats, added sugars, and salt. Eat the right amount of calories for you.Get information about a proper diet from your  health care provider, if necessary.  Regular physical exercise is one of the most important things you can do for your health. Most adults should get at least 150 minutes of moderate-intensity exercise (any activity that increases your heart rate and causes you to sweat) each week. In addition, most adults need muscle-strengthening exercises on 2 or more days a week.  Maintain a healthy weight. The body mass index (BMI) is a screening tool to identify possible weight problems. It provides an estimate of body fat based on height and weight. Your health care provider can find your  BMI and can help you achieve or maintain a healthy weight.For adults 20 years and older:  A BMI below 18.5 is considered underweight.  A BMI of 18.5 to 24.9 is normal.  A BMI of 25 to 29.9 is considered overweight.  A BMI of 30 and above is considered obese.  Maintain normal blood lipids and cholesterol levels by exercising and minimizing your intake of saturated fat. Eat a balanced diet with plenty of fruit and vegetables. Blood tests for lipids and cholesterol should begin at age 29 and be repeated every 5 years. If your lipid or cholesterol levels are high, you are over 50, or you are at high risk for heart disease, you may need your cholesterol levels checked more frequently.Ongoing high lipid and cholesterol levels should be treated with medicines if diet and exercise are not working.  If you smoke, find out from your health care provider how to quit. If you do not use tobacco, do not start.  Lung cancer screening is recommended for adults aged 45-80 years who are at high risk for developing lung cancer because of a history of smoking. A yearly low-dose CT scan of the lungs is recommended for people who have at least a 30-pack-year history of smoking and are a current smoker or have quit within the past 15 years. A pack year of smoking is smoking an average of 1 pack of cigarettes a day for 1 year (for example: 1 pack a day for 30 years or 2 packs a day for 15 years). Yearly screening should continue until the smoker has stopped smoking for at least 15 years. Yearly screening should be stopped for people who develop a health problem that would prevent them from having lung cancer treatment.  High blood pressure causes heart disease and increases the risk of stroke. Your blood pressure should be checked at least every 1 to 2 years. Ongoing high blood pressure should be treated with medicines if weight loss and exercise do not work.  If you are 18-34 years old, ask your health care provider if  you should take aspirin to prevent strokes.  Diabetes screening involves taking a blood sample to check your fasting blood sugar level. This should be done once every 3 years, after age 39, if you are within normal weight and without risk factors for diabetes. Testing should be considered at a younger age or be carried out more frequently if you are overweight and have at least 1 risk factor for diabetes.  Breast cancer screening is essential preventive care for women. You should practice "breast self-awareness." This means understanding the normal appearance and feel of your breasts and may include breast self-examination. Any changes detected, no matter how small, should be reported to a health care provider. Women in their 4s and 30s should have a clinical breast exam (CBE) by a health care provider as part of a regular health exam every  1 to 3 years. After age 73, women should have a CBE every year. Starting at age 39, women should consider having a mammogram (breast X-ray test) every year. Women who have a family history of breast cancer should talk to their health care provider about genetic screening. Women at a high risk of breast cancer should talk to their health care providers about having an MRI and a mammogram every year.  Breast cancer gene (BRCA)-related cancer risk assessment is recommended for women who have family members with BRCA-related cancers. BRCA-related cancers include breast, ovarian, tubal, and peritoneal cancers. Having family members with these cancers may be associated with an increased risk for harmful changes (mutations) in the breast cancer genes BRCA1 and BRCA2. Results of the assessment will determine the need for genetic counseling and BRCA1 and BRCA2 testing.  Routine pelvic exams to screen for cancer are no longer recommended for nonpregnant women who are considered low risk for cancer of the pelvic organs (ovaries, uterus, and vagina) and who do not have symptoms. Ask  your health care provider if a screening pelvic exam is right for you.  If you have had past treatment for cervical cancer or a condition that could lead to cancer, you need Pap tests and screening for cancer for at least 20 years after your treatment. If Pap tests have been discontinued, your risk factors (such as having a new sexual partner) need to be reassessed to determine if screening should be resumed. Some women have medical problems that increase the chance of getting cervical cancer. In these cases, your health care provider may recommend more frequent screening and Pap tests.  Colorectal cancer can be detected and often prevented. Most routine colorectal cancer screening begins at the age of 29 years and continues through age 66 years. However, your health care provider may recommend screening at an earlier age if you have risk factors for colon cancer. On a yearly basis, your health care provider may provide home test kits to check for hidden blood in the stool. Use of a small camera at the end of a tube, to directly examine the colon (sigmoidoscopy or colonoscopy), can detect the earliest forms of colorectal cancer. Talk to your health care provider about this at age 23, when routine screening begins. Direct exam of the colon should be repeated every 5-10 years through age 47 years, unless early forms of pre-cancerous polyps or small growths are found.  Hepatitis C blood testing is recommended for all people born from [birth year] through 1965 and any individual with known risks for hepatitis C.  Pra  Osteoporosis is a disease in which the bones lose minerals and strength with aging. This can result in serious bone fractures or breaks. The risk of osteoporosis can be identified using a bone density scan. Women ages 46 years and over and women at risk for fractures or osteoporosis should discuss screening with their health care providers. Ask your health care provider whether you should take a calcium  supplement or vitamin D to reduce the rate of osteoporosis.  Menopause can be associated with physical symptoms and risks. Hormone replacement therapy is available to decrease symptoms and risks. You should talk to your health care provider about whether hormone replacement therapy is right for you.  Use sunscreen. Apply sunscreen liberally and repeatedly throughout the day. You should seek shade when your shadow is shorter than you. Protect yourself by wearing long sleeves, pants, a wide-brimmed hat, and sunglasses year round, whenever you are outdoors.  Once a month, do a whole body skin exam, using a mirror to look at the skin on your back. Tell your health care provider of new moles, moles that have irregular borders, moles that are larger than a pencil eraser, or moles that have changed in shape or color.  Stay current with required vaccines (immunizations).  Influenza vaccine. All adults should be immunized every year.  Tetanus, diphtheria, and acellular pertussis (Td, Tdap) vaccine. Pregnant women should receive 1 dose of Tdap vaccine during each pregnancy. The dose should be obtained regardless of the length of time since the last dose. Immunization is preferred during the 27th-36th week of gestation. An adult who has not previously received Tdap or who does not know her vaccine status should receive 1 dose of Tdap. This initial dose should be followed by tetanus and diphtheria toxoids (Td) booster doses every 10 years. Adults with an unknown or incomplete history of completing a 3-dose immunization series with Td-containing vaccines should begin or complete a primary immunization series including a Tdap dose. Adults should receive a Td booster every 10 years.  Varicella vaccine. An adult without evidence of immunity to varicella should receive 2 doses or a second dose if she has previously received 1 dose. Pregnant females who do not have evidence of immunity should receive the first dose  after pregnancy. This first dose should be obtained before leaving the health care facility. The second dose should be obtained 4-8 weeks after the first dose.  Human papillomavirus (HPV) vaccine. Females aged 13-26 years who have not received the vaccine previously should obtain the 3-dose series. The vaccine is not recommended for use in pregnant females. However, pregnancy testing is not needed before receiving a dose. If a female is found to be pregnant after receiving a dose, no treatment is needed. In that case, the remaining doses should be delayed until after the pregnancy. Immunization is recommended for any person with an immunocompromised condition through the age of 53 years if she did not get any or all doses earlier. During the 3-dose series, the second dose should be obtained 4-8 weeks after the first dose. The third dose should be obtained 24 weeks after the first dose and 16 weeks after the second dose.  Zoster vaccine. One dose is recommended for adults aged 75 years or older unless certain conditions are present.  Measles, mumps, and rubella (MMR) vaccine. Adults born before 40 generally are considered immune to measles and mumps. Adults born in [birth year] or later should have 1 or more doses of MMR vaccine unless there is a contraindication to the vaccine or there is laboratory evidence of immunity to each of the three diseases. A routine second dose of MMR vaccine should be obtained at least 28 days after the first dose for students attending postsecondary schools, health care workers, or international travelers. People who received inactivated measles vaccine or an unknown type of measles vaccine during 1963-1967 should receive 2 doses of MMR vaccine. People who received inactivated mumps vaccine or an unknown type of mumps vaccine before 1979 and are at high risk for mumps infection should consider immunization with 2 doses of MMR vaccine. For females of childbearing age, rubella immunity  should be determined. If there is no evidence of immunity, females who are not pregnant should be vaccinated. If there is no evidence of immunity, females who are pregnant should delay immunization until after pregnancy. Unvaccinated health care workers born before 1957 who lack laboratory evidence of measles, mumps,  or rubella immunity or laboratory confirmation of disease should consider measles and mumps immunization with 2 doses of MMR vaccine or rubella immunization with 1 dose of MMR vaccine.  Pneumococcal 13-valent conjugate (PCV13) vaccine. When indicated, a person who is uncertain of her immunization history and has no record of immunization should receive the PCV13 vaccine. An adult aged 86 years or older who has certain medical conditions and has not been previously immunized should receive 1 dose of PCV13 vaccine. This PCV13 should be followed with a dose of pneumococcal polysaccharide (PPSV23) vaccine. The PPSV23 vaccine dose should be obtained at least 8 weeks after the dose of PCV13 vaccine. An adult aged 69 years or older who has certain medical conditions and previously received 1 or more doses of PPSV23 vaccine should receive 1 dose of PCV13. The PCV13 vaccine dose should be obtained 1 or more years after the last PPSV23 vaccine dose.    Pneumococcal polysaccharide (PPSV23) vaccine. When PCV13 is also indicated, PCV13 should be obtained first. All adults aged 69 years and older should be immunized. An adult younger than age 28 years who has certain medical conditions should be immunized. Any person who resides in a nursing home or long-term care facility should be immunized. An adult smoker should be immunized. People with an immunocompromised condition and certain other conditions should receive both PCV13 and PPSV23 vaccines. People with human immunodeficiency virus (HIV) infection should be immunized as soon as possible after diagnosis. Immunization during chemotherapy or radiation therapy  should be avoided. Routine use of PPSV23 vaccine is not recommended for [place] Indians, [place] Natives, or people younger than 65 years unless there are medical conditions that require PPSV23 vaccine. When indicated, people who have unknown immunization and have no record of immunization should receive PPSV23 vaccine. One-time revaccination 5 years after the first dose of PPSV23 is recommended for people aged 19-64 years who have chronic kidney failure, nephrotic syndrome, asplenia, or immunocompromised conditions. People who received 1-2 doses of PPSV23 before age 69 years should receive another dose of PPSV23 vaccine at age 46 years or later if at least 5 years have passed since the previous dose. Doses of PPSV23 are not needed for people immunized with PPSV23 at or after age 72 years.  Preventive Services / Frequency   Ages 5 to 54 years  Blood pressure check.  Lipid and cholesterol check.  Lung cancer screening. / Every year if you are aged 60-80 years and have a 30-pack-year history of smoking and currently smoke or have quit within the past 15 years. Yearly screening is stopped once you have quit smoking for at least 15 years or develop a health problem that would prevent you from having lung cancer treatment.  Clinical breast exam.** / Every year after age 28 years.  BRCA-related cancer risk assessment.** / For women who have family members with a BRCA-related cancer (breast, ovarian, tubal, or peritoneal cancers).  Mammogram.** / Every year beginning at age 45 years and continuing for as long as you are in good health. Consult with your health care provider.  Pap test.** / Every 3 years starting at age 43 years through age 26 or 57 years with a history of 3 consecutive normal Pap tests.  HPV screening.** / Every 3 years from ages 54 years through ages 25 to 33 years with a history of 3 consecutive normal Pap tests.  Fecal occult blood test (FOBT) of stool. / Every year beginning at  age 44 years and continuing until  age 42 years. You may not need to do this test if you get a colonoscopy every 10 years.  Flexible sigmoidoscopy or colonoscopy.** / Every 5 years for a flexible sigmoidoscopy or every 10 years for a colonoscopy beginning at age 41 years and continuing until age 64 years.  Hepatitis C blood test.** / For all people born from [birth year] through 1964 and any individual with known risks for hepatitis C.  Skin self-exam. / Monthly.  Influenza vaccine. / Every year.  Tetanus, diphtheria, and acellular pertussis (Tdap/Td) vaccine.** / Consult your health care provider. Pregnant women should receive 1 dose of Tdap vaccine during each pregnancy. 1 dose of Td every 10 years.  Varicella vaccine.** / Consult your health care provider. Pregnant females who do not have evidence of immunity should receive the first dose after pregnancy.  Zoster vaccine.** / 1 dose for adults aged 53 years or older.  Pneumococcal 13-valent conjugate (PCV13) vaccine.** / Consult your health care provider.  Pneumococcal polysaccharide (PPSV23) vaccine.** / 1 to 2 doses if you smoke cigarettes or if you have certain conditions.  Meningococcal vaccine.** / Consult your health care provider.  Hepatitis A vaccine.** / Consult your health care provider.  Hepatitis B vaccine.** / Consult your health care provider. Screening for abdominal aortic aneurysm (AAA)  by ultrasound is recommended for people over 50 who have history of high blood pressure or who are current or former smokers.

## 2015-01-16 NOTE — Progress Notes (Signed)
Patient ID: Bonnie Hall, female   DOB: 05/30/1961, 54 y.o.   MRN: [id number]  Annual Comprehensive Examination  This very nice 54 y.o. MWF presents for complete physical.  Patient has been followed for labile HTN, Prediabetes, Hyperlipidemia, and Vitamin D Deficiency. Patient also has hx/o OSA with sleep study about 5 yrs ago (and unavailable) and has been intolerant to the CPAP mask and devices. She reports constant fatigue which she attributes to poor sleep hygiene. She desires referral for trial with a Moses oral appliance. Patient also has hx/oGERD and has been on long term tx/with PPI's.     Patient has hx/o labile HTN predating many years to her 1st pregnancy and has been followed expectantly . Patient's BP has been controlled at home and patient denies any cardiac symptoms as chest pain, palpitations, shortness of breath, dizziness or ankle swelling. Today's BP: 116/74 mmHg    Patient's hyperlipidemia is controlled with diet and medications. Patient denies myalgias or other medication SE's. Last lipids were at goal - Total Chol 168; HDL 55; LDL  88; Trig 126 on 08/22/2014.    Patient has Morbid Obesity (BMI 30 +) and consequent prediabetes predating since  2012 with A1c 5.9% and in 2013 had A1c 5.9% with elevated insulin 54 and patient denies reactive hypoglycemic symptoms, visual blurring, diabetic polys, or paresthesias. Last A1c was 5.6% in May 2015.    Finally, patient has history of Vitamin D Deficiency of 43 on supplements in 2012 and last Vitamin D was 49 on 08/22/2014.      Medication Sig  . ALPRAZolam  0.5 MG tablet Take 0.5 mg by mouth as needed.  Marland Kitchen FIORICET 50-325-40 MG  Take one tablet every 12 hours as needed for pain  . VITAMIN D 1000 UNITS Take 4,000 Units by mouth daily.  Marland Kitchen escitalopram 20 MG tablet Take 20 mg by mouth daily.  Marland Kitchen esomeprazole 40 MG capsule Take 1 capsule (40 mg total) by mouth 2 (two) times daily.  . ACTIVELLA 1-0.5 MG per tablet Take 1 tablet by mouth  daily.  . Iron 66 MG TABS Take 1 tablet by mouth 2 (two) times daily.  Marland Kitchen loratadine 10 MG tablet Take 1 tablet (10 mg total) by mouth daily.  . meclizine50 MG tablet Take 0.5-1 tablets (25-50 mg total) by mouth 2 (two) times daily as needed.  . Melatonin 3 MG CAPS Take 6 capsules by mouth at bedtime.   Marland Kitchen NASONEX nasal spray Place 2 sprays into the nose daily.  . montelukast  10 MG tablet Take 1 tablet (10 mg total) by mouth at bedtime.  . Multiple Vitamins-Minerals  Take 1 tablet by mouth daily.  . pravastatin 40 MG tablet Take 1 tablet (40 mg total) by mouth daily.  . traMADol  50 MG tablet TAKE 1 TABLET BY MOUTH TWICE DAILY AS NEEDED FOR PAIN   No Known Allergies   Past Medical History  Diagnosis Date  . Blood transfusion 1990's  . Thrombocytosis   . Migraine   . Sleep apnea   . Allergy     Hay fever/exercise induced asthma  . Iron deficiency anemia 1990's    negative work up as to etiology; required iron infusion, blood transfusion.  Saw an hematologist  at [facility].   Marland Kitchen Anxiety   . Depression     bipolar  . GERD (gastroesophageal reflux disease)   . Hyperlipidemia   . Hypertension    Health Maintenance  Topic Date Due  . INFLUENZA VACCINE  04/14/2015  . MAMMOGRAM  03/22/2016  . PAP SMEAR  12/25/2017  . TETANUS/TDAP  12/27/2020  . COLONOSCOPY  04/11/2022  . HIV Screening  Completed   Immunization History  Administered Date(s) Administered  . Influenza Split 07/10/2013, 07/04/2014  . PPD Test 01/14/2014  . Pneumococcal Polysaccharide-23 12/31/2011  . Tdap 12/28/2010   Past Surgical History  Procedure Laterality Date  . Nasal septum surgery  1983  . Tonsillectomy  1966  . Cesarean section  2009  . Colonoscopy  06/2012    neg. with [place]   Family History  Problem Relation Age of Onset  . Colon cancer Paternal Uncle 41  . Colon cancer Paternal Uncle 87  . Prostate cancer Mother 50  . Breast cancer Paternal Aunt    History  Substance Use Topics  . Smoking  status: Never Smoker   . Smokeless tobacco: Never Used  . Alcohol Use: No    ROS Constitutional: Denies fever, chills, weight loss/gain, headaches, insomnia,  night sweats, and change in appetite. Does c/o fatigue. Eyes: Denies redness, blurred vision, diplopia, discharge, itchy, watery eyes.  ENT: Denies discharge, congestion, post nasal drip, epistaxis, sore throat, earache, hearing loss, dental pain, Tinnitus, Vertigo, Sinus pain, snoring.  Cardio: Denies chest pain, palpitations, irregular heartbeat, syncope, dyspnea, diaphoresis, orthopnea, PND, claudication, edema Respiratory: denies cough, dyspnea, DOE, pleurisy, hoarseness, laryngitis, wheezing.  Gastrointestinal: Denies dysphagia, heartburn, reflux, water brash, pain, cramps, nausea, vomiting, bloating, diarrhea, constipation, hematemesis, melena, hematochezia, jaundice, hemorrhoids Genitourinary: Denies dysuria, frequency, urgency, nocturia, hesitancy, discharge, hematuria, flank pain Breast: Breast lumps, nipple discharge, bleeding.  Musculoskeletal: Denies arthralgia, myalgia, stiffness, Jt. Swelling, pain, limp, and strain/sprain. Denies falls. Skin: Denies puritis, rash, hives, warts, acne, eczema, changing in skin lesion Neuro: No weakness, tremor, incoordination, spasms, paresthesia, pain Psychiatric: Denies confusion, memory loss, sensory loss. Denies Depression. Endocrine: Denies change in weight, skin, hair change, nocturia, and paresthesia, diabetic polys, visual blurring, hyper / hypo glycemic episodes.  Heme/Lymph: No excessive bleeding, bruising, enlarged lymph nodes.  Physical Exam  BP 116/74   Pulse 76  Temp 97.7 F   Resp 16  Ht 5' 5.75"   Wt 186 lb 6.4 oz    BMI 30.32   General Appearance: Well nourished and in no apparent distress. Eyes: PERRLA, EOMs, conjunctiva no swelling or erythema, normal fundi and vessels. Sinuses: No frontal/maxillary tenderness ENT/Mouth: EACs patent / TMs  nl. Nares clear without  erythema, swelling, mucoid exudates. Oral hygiene is good. No erythema, swelling, or exudate. Tongue normal, non-obstructing. Tonsils not swollen or erythematous. Hearing normal.  Neck: Supple, thyroid normal. No bruits, nodes or JVD. Respiratory: Respiratory effort normal.  BS equal and clear bilateral without rales, rhonci, wheezing or stridor. Cardio: Heart sounds are normal with regular rate and rhythm and no murmurs, rubs or gallops. Peripheral pulses are normal and equal bilaterally without edema. No aortic or femoral bruits. Chest: symmetric with normal excursions and percussion.  Abdomen: Flat, soft, with bowl sounds. Nontender, no guarding, rebound, hernias, masses, or organomegaly.  Lymphatics: Non tender without lymphadenopathy.  Musculoskeletal: Full ROM all peripheral extremities, joint stability, 5/5 strength, and normal gait. Skin: Warm and dry without rashes, lesions, cyanosis, clubbing or  ecchymosis.  Neuro: Cranial nerves intact, reflexes equal bilaterally. Normal muscle tone, no cerebellar symptoms. Sensation intact.  Pysch: Awake and oriented X 3, normal affect, Insight and Judgment appropriate.   Assessment and Plan  1. Labile hypertension  - Microalbumin / creatinine urine ratio - EKG 12-Lead - [place], RETROPERITNL ABD,  LTD  2. Hyperlipidemia  - Lipid panel  3. Abnormal glucose  - Hemoglobin A1c - Insulin, random  4. Vitamin D deficiency  - Vit D  25 hydroxy  5. Gastroesophageal reflux disease  - Discussed transitioning from PPI to H2Blocker Tx.  - ranitidine (ZANTAC) 300 MG tablet; Take 1 to 2 tablets daily for heartburn & reflux to allow wean and transition from PPI / Nexium  Dispense: 180 tablet; Refill: 99  6. Iron deficiency anemia   7. Obstructive Sleep apnea  - referral for Moses Oral Appliance  8. Screening for rectal cancer  - Recent neg colonoscopy and neg hemoccult from Gynecologist.  9. Other fatigue  - Vitamin B12 - Iron and  TIBC - TSH  10. Screening examination for pulmonary tuberculosis  - PPD  11. Medication management  - Urine Microscopic - CBC with Differential/Platelet - BASIC METABOLIC PANEL WITH GFR - Hepatic function panel - Magnesium   Continue prudent diet as discussed, weight control, BP monitoring, regular exercise, and medications. Discussed med's effects and SE's. Screening labs and tests as requested with regular follow-up as recommended. Over 40 minutes of exam, counseling, chart review was performed.

## 2015-01-17 LAB — URINALYSIS, MICROSCOPIC ONLY
Bacteria, UA: NONE SEEN
Casts: NONE SEEN
Crystals: NONE SEEN
Squamous Epithelial / LPF: NONE SEEN

## 2015-01-17 LAB — MICROALBUMIN / CREATININE URINE RATIO
Creatinine, Urine: 64.4 mg/dL
Microalb Creat Ratio: 4.7 mg/g (ref 0.0–30.0)
Microalb, Ur: 0.3 mg/dL (ref <2.0)

## 2015-01-17 LAB — VITAMIN D 25 HYDROXY (VIT D DEFICIENCY, FRACTURES): Vit D, 25-Hydroxy: 57 ng/mL (ref 30–100)

## 2015-01-17 LAB — INSULIN, RANDOM: Insulin: 25.2 u[IU]/mL — ABNORMAL HIGH (ref 2.0–19.6)

## 2015-04-16 ENCOUNTER — Other Ambulatory Visit: Payer: Self-pay | Admitting: Internal Medicine

## 2015-04-16 DIAGNOSIS — G44219 Episodic tension-type headache, not intractable: Secondary | ICD-10-CM

## 2015-04-16 MED ORDER — TRAMADOL HCL 50 MG PO TABS: 50.0000 mg | ORAL_TABLET | Freq: Two times a day (BID) | ORAL | Status: DC | PRN

## 2015-04-16 MED ORDER — BUTALBITAL-APAP-CAFFEINE 50-325-40 MG PO TABS: ORAL_TABLET | ORAL | Status: DC

## 2015-05-02 ENCOUNTER — Ambulatory Visit: Payer: Self-pay | Admitting: Internal Medicine

## 2015-06-17 ENCOUNTER — Ambulatory Visit (INDEPENDENT_AMBULATORY_CARE_PROVIDER_SITE_OTHER): Admitting: Physician Assistant

## 2015-06-17 ENCOUNTER — Encounter: Payer: Self-pay | Admitting: Physician Assistant

## 2015-06-17 VITALS — BP 118/62 | HR 69 | Temp 97.9°F | Resp 14 | Ht 65.75 in | Wt 186.0 lb

## 2015-06-17 DIAGNOSIS — E559 Vitamin D deficiency, unspecified: Secondary | ICD-10-CM

## 2015-06-17 DIAGNOSIS — I10 Essential (primary) hypertension: Secondary | ICD-10-CM | POA: Diagnosis not present

## 2015-06-17 DIAGNOSIS — Z23 Encounter for immunization: Secondary | ICD-10-CM | POA: Diagnosis not present

## 2015-06-17 DIAGNOSIS — R7309 Other abnormal glucose: Secondary | ICD-10-CM

## 2015-06-17 DIAGNOSIS — R0989 Other specified symptoms and signs involving the circulatory and respiratory systems: Secondary | ICD-10-CM

## 2015-06-17 DIAGNOSIS — Z79899 Other long term (current) drug therapy: Secondary | ICD-10-CM

## 2015-06-17 DIAGNOSIS — H0012 Chalazion right lower eyelid: Secondary | ICD-10-CM

## 2015-06-17 DIAGNOSIS — E785 Hyperlipidemia, unspecified: Secondary | ICD-10-CM

## 2015-06-17 LAB — CBC WITH DIFFERENTIAL/PLATELET
Basophils Absolute: 0 10*3/uL (ref 0.0–0.1)
Basophils Relative: 0 % (ref 0–1)
Eosinophils Absolute: 0.1 10*3/uL (ref 0.0–0.7)
Eosinophils Relative: 1 % (ref 0–5)
HCT: 42.5 % (ref 36.0–46.0)
Hemoglobin: 13.9 g/dL (ref 12.0–15.0)
Lymphocytes Relative: 9 % — ABNORMAL LOW (ref 12–46)
Lymphs Abs: 1 10*3/uL (ref 0.7–4.0)
MCH: 30.9 pg (ref 26.0–34.0)
MCHC: 32.7 g/dL (ref 30.0–36.0)
MCV: 94.4 fL (ref 78.0–100.0)
MPV: 10.5 fL (ref 8.6–12.4)
Monocytes Absolute: 0.4 10*3/uL (ref 0.1–1.0)
Monocytes Relative: 4 % (ref 3–12)
Neutro Abs: 9.3 10*3/uL — ABNORMAL HIGH (ref 1.7–7.7)
Neutrophils Relative %: 86 % — ABNORMAL HIGH (ref 43–77)
Platelets: 438 10*3/uL — ABNORMAL HIGH (ref 150–400)
RBC: 4.5 MIL/uL (ref 3.87–5.11)
RDW: 13.4 % (ref 11.5–15.5)
WBC: 10.8 10*3/uL — ABNORMAL HIGH (ref 4.0–10.5)

## 2015-06-17 LAB — BASIC METABOLIC PANEL WITH GFR
BUN: 16 mg/dL (ref 7–25)
CO2: 26 mmol/L (ref 20–31)
Calcium: 9.5 mg/dL (ref 8.6–10.4)
Chloride: 104 mmol/L (ref 98–110)
Creat: 0.79 mg/dL (ref 0.50–1.05)
GFR, Est African American: >89 mL/min (ref >=60)
GFR, Est Non African American: 86 mL/min (ref >=60)
Glucose, Bld: 99 mg/dL (ref 65–99)
Potassium: 4.1 mmol/L (ref 3.5–5.3)
Sodium: 138 mmol/L (ref 135–146)

## 2015-06-17 LAB — LIPID PANEL
Cholesterol: 140 mg/dL (ref 125–200)
HDL: 40 mg/dL — ABNORMAL LOW (ref >=46)
LDL Cholesterol: 76 mg/dL (ref <130)
Total CHOL/HDL Ratio: 3.5 Ratio (ref <=5.0)
Triglycerides: 122 mg/dL (ref <150)
VLDL: 24 mg/dL (ref <30)

## 2015-06-17 LAB — HEPATIC FUNCTION PANEL
ALT: 9 U/L (ref 6–29)
AST: 11 U/L (ref 10–35)
Albumin: 4.4 g/dL (ref 3.6–5.1)
Alkaline Phosphatase: 55 U/L (ref 33–130)
Bilirubin, Direct: 0.1 mg/dL (ref <=0.2)
Indirect Bilirubin: 0.3 mg/dL (ref 0.2–1.2)
Total Bilirubin: 0.4 mg/dL (ref 0.2–1.2)
Total Protein: 6.8 g/dL (ref 6.1–8.1)

## 2015-06-17 LAB — MAGNESIUM: Magnesium: 2 mg/dL (ref 1.5–2.5)

## 2015-06-17 LAB — TSH: TSH: 1.037 u[IU]/mL (ref 0.350–4.500)

## 2015-06-17 MED ORDER — NEOMYCIN-POLYMYXIN-DEXAMETH 3.5-10000-0.1 OP SUSP: 1.0000 [drp] | Freq: Four times a day (QID) | OPHTHALMIC | Status: DC

## 2015-06-17 NOTE — Patient Instructions (Signed)
Please pick one of the over the counter allergy medications below and take it once daily for allergies.  Claritin or loratadine cheapest but likely the weakest  Zyrtec or certizine at night because it can make you sleepy The strongest is allegra or fexafinadine  Cheapest at [employer], [employer], [employer]  Chalazion A chalazion is a swelling or hard lump on the eyelid caused by a blocked oil gland. Chalazions may occur on the upper or the lower eyelid.  CAUSES  Oil gland in the eyelid becomes blocked. SYMPTOMS   Swelling or hard lump on the eyelid. This lump may make it hard to see out of the eye.  The swelling may spread to areas around the eye. TREATMENT   Although some chalazions disappear by themselves in 1 or 2 months, some chalazions may need to be removed.  Medicines to treat an infection may be required. HOME CARE INSTRUCTIONS   Wash your hands often and dry them with a clean towel. Do not touch the chalazion.  Apply heat to the eyelid several times a day for 10 minutes to help ease discomfort and bring any yellowish white fluid (pus) to the surface. One way to apply heat to a chalazion is to use the handle of a metal spoon.  Hold the handle under hot water until it is hot, and then wrap the handle in paper towels so that the heat can come through without burning your skin.  Hold the wrapped handle against the chalazion and reheat the spoon handle as needed.  Apply heat in this fashion for 10 minutes, 4 times per day.  Return to your caregiver to have the pus removed if it does not break (rupture) on its own.  Do not try to remove the pus yourself by squeezing the chalazion or sticking it with a pin or needle.  Only take over-the-counter or prescription medicines for pain, discomfort, or fever as directed by your caregiver. SEEK IMMEDIATE MEDICAL CARE IF:   You have pain in your eye.  Your vision changes.  The chalazion does not go away.  The chalazion becomes painful,  red, or swollen, grows larger, or does not start to disappear after 2 weeks. MAKE SURE YOU:   Understand these instructions.  Will watch your condition.  Will get help right away if you are not doing well or get worse. Document Released: 08/27/2000 Document Revised: 11/22/2011 Document Reviewed: 12/15/2009 [facility] Patient Information 2015 [place], [place]. This information is not intended to replace advice given to you by your health care provider. Make sure you discuss any questions you have with your health care provider.

## 2015-06-17 NOTE — Progress Notes (Signed)
Assessment and Plan:  1. Hypertension -Continue medication, monitor blood pressure at home. Continue DASH diet.  Reminder to go to the ER if any CP, SOB, nausea, dizziness, severe HA, changes vision/speech, left arm numbness and tingling and jaw pain.  2. Cholesterol -Continue diet and exercise. Check cholesterol.   3. Prediabetes  -Continue diet and exercise. Check A1C  4. Vitamin D Def - check level and continue medications.   5. Chalazion right eye Warm compresses, get on allergy pill, will give drops, only use if gets worse  Continue diet and meds as discussed. Further disposition pending results of labs. Over 30 minutes of exam, counseling, chart review, and critical decision making was performed  HPI 54 y.o. female  presents for 3 month follow up on hypertension, cholesterol, prediabetes, and vitamin D deficiency.  She also complains of right eye itching and pain x Sunday, no sick contacts. Has some cold symptoms starting yesterday. Denies fever, chills, changes in vision, HA, discharge. No sick contacts.   Her blood pressure has been controlled at home, today their BP is BP: 118/62 mmHg  She does not workout. She denies chest pain, shortness of breath, dizziness.  She is on cholesterol medication and denies myalgias. Her cholesterol is at goal. The cholesterol last visit was:   Lab Results  Component Value Date   CHOL 168 01/16/2015   HDL 49 01/16/2015   LDLCALC 94 01/16/2015   TRIG 126 01/16/2015   CHOLHDL 3.4 01/16/2015    She has been working on diet and exercise for prediabetes, and denies paresthesia of the feet, polydipsia, polyuria and visual disturbances. Last A1C in the office was:  Lab Results  Component Value Date   HGBA1C 5.7* 01/16/2015   Patient is on Vitamin D supplement.   Lab Results  Component Value Date   VD25OH 57 01/16/2015     Had + sleep study, can not tolerate CPAP, has not called about mouth piece.   Current Medications:  Current  Outpatient Prescriptions on File Prior to Visit  Medication Sig Dispense Refill  . ALPRAZolam (XANAX) 0.5 MG tablet Take 0.5 mg by mouth as needed.    Marland Kitchen aspirin EC 81 MG tablet Take 1 tablet (81 mg total) by mouth daily.    . butalbital-acetaminophen-caffeine (FIORICET, ESGIC) 50-325-40 MG per tablet Take one tablet every 12 hours as needed for pain 100 tablet 0  . cholecalciferol (VITAMIN D) 1000 UNITS tablet Take 4,000 Units by mouth daily.    Marland Kitchen escitalopram (LEXAPRO) 20 MG tablet Take 20 mg by mouth daily.    Marland Kitchen esomeprazole (NEXIUM) 40 MG capsule Take 1 capsule (40 mg total) by mouth 2 (two) times daily. 180 capsule 3  . estradiol-norethindrone (ACTIVELLA) 1-0.5 MG per tablet Take 1 tablet by mouth daily. 90 tablet 3  . Iron 66 MG TABS Take 1 tablet by mouth 2 (two) times daily.    Marland Kitchen loratadine (CLARITIN) 10 MG tablet Take 1 tablet (10 mg total) by mouth daily. 90 tablet 6  . meclizine (ANTIVERT) 50 MG tablet Take 0.5-1 tablets (25-50 mg total) by mouth 2 (two) times daily as needed. 30 tablet 0  . Melatonin 3 MG CAPS Take 6 capsules by mouth at bedtime.     . mometasone (NASONEX) 50 MCG/ACT nasal spray Place 2 sprays into the nose daily. 17 g 3  . montelukast (SINGULAIR) 10 MG tablet Take 1 tablet (10 mg total) by mouth at bedtime. 90 tablet 3  . Multiple Vitamins-Minerals (MULTIVITAMIN WITH MINERALS)  tablet Take 1 tablet by mouth daily.    . pravastatin (PRAVACHOL) 40 MG tablet Take 1 tablet (40 mg total) by mouth daily. 90 tablet 4  . ranitidine (ZANTAC) 300 MG tablet Take 1 to 2 tablets daily for heartburn & reflux to allow wean and transition from PPI / Nexium 180 tablet 99  . traMADol (ULTRAM) 50 MG tablet Take 1 tablet (50 mg total) by mouth 2 (two) times daily as needed. for pain 60 tablet 0   No current facility-administered medications on file prior to visit.   Medical History:  Past Medical History  Diagnosis Date  . Blood transfusion 1990's  . Thrombocytosis ([place])   .  Migraine   . Sleep apnea   . Allergy     Hay fever/exercise induced asthma  . Iron deficiency anemia 1990's    negative work up as to etiology; required iron infusion, blood transfusion.  Saw an hematologist  at [facility].   Marland Kitchen Anxiety   . Depression     bipolar  . GERD (gastroesophageal reflux disease)   . Hyperlipidemia   . Hypertension    Allergies: No Known Allergies   Review of Systems:  Review of Systems  Constitutional: Negative.  Negative for fever and chills.  HENT: Positive for congestion. Negative for ear discharge, ear pain, hearing loss, nosebleeds, sore throat and tinnitus.   Eyes: Positive for pain and redness. Negative for blurred vision, double vision, photophobia and discharge.  Respiratory: Negative.  Negative for stridor.   Cardiovascular: Negative.   Gastrointestinal: Negative.   Genitourinary: Negative.   Musculoskeletal: Negative.   Skin: Negative.  Negative for rash.  Neurological: Negative.  Negative for headaches.  Endo/Heme/Allergies: Negative.   Psychiatric/Behavioral: Negative.     Family history- Review and unchanged Social history- Review and unchanged Physical Exam: BP 118/62 mmHg  Pulse 69  Temp(Src) 97.9 F (36.6 C) (Temporal)  Resp 14  Ht 5' 5.75" (1.67 m)  Wt 186 lb (84.369 kg)  BMI 30.25 kg/m2  SpO2 98%  LMP 03/12/2012 Wt Readings from Last 3 Encounters:  06/17/15 186 lb (84.369 kg)  01/16/15 186 lb 6.4 oz (84.55 kg)  08/22/14 173 lb (78.472 kg)   General Appearance: Well nourished, in no apparent distress. Eyes: PERRLA, EOMs, conjunctiva no swelling or erythema, right inner lower lid with erythematous gland, no warmth, discharge.  Sinuses: No Frontal/maxillary tenderness ENT/Mouth: Ext aud canals clear, TMs without erythema, bulging. No erythema, swelling, or exudate on post pharynx.  Tonsils not swollen or erythematous. Hearing normal.  Neck: Supple, thyroid normal.  Respiratory: Respiratory effort normal, BS equal bilaterally  without rales, rhonchi, wheezing or stridor.  Cardio: RRR with no MRGs. Brisk peripheral pulses without edema.  Abdomen: Soft, + BS,  Non tender, no guarding, rebound, hernias, masses. Lymphatics: Non tender without lymphadenopathy.  Musculoskeletal: Full ROM, 5/5 strength, Normal gait Skin: Warm, dry without rashes, lesions, ecchymosis.  Neuro: Cranial nerves intact. Normal muscle tone, no cerebellar symptoms. Psych: Awake and oriented X 3, normal affect, Insight and Judgment appropriate.    Vicie Mutters, PA-C 8:49 AM [facility]

## 2015-06-18 ENCOUNTER — Other Ambulatory Visit: Payer: Self-pay | Admitting: Physician Assistant

## 2015-06-18 LAB — HEMOGLOBIN A1C
Hgb A1c MFr Bld: 5.9 % — ABNORMAL HIGH (ref <5.7)
Mean Plasma Glucose: 123 mg/dL — ABNORMAL HIGH (ref <117)

## 2015-06-18 LAB — VITAMIN D 25 HYDROXY (VIT D DEFICIENCY, FRACTURES): Vit D, 25-Hydroxy: 45 ng/mL (ref 30–100)

## 2015-06-18 MED ORDER — AZITHROMYCIN 250 MG PO TABS: ORAL_TABLET | ORAL | Status: AC

## 2015-07-17 ENCOUNTER — Encounter: Payer: Self-pay | Admitting: Physician Assistant

## 2015-07-17 ENCOUNTER — Ambulatory Visit (INDEPENDENT_AMBULATORY_CARE_PROVIDER_SITE_OTHER): Admitting: Physician Assistant

## 2015-07-17 VITALS — BP 118/66 | HR 67 | Temp 97.7°F | Resp 16 | Ht 65.75 in | Wt 183.0 lb

## 2015-07-17 DIAGNOSIS — J209 Acute bronchitis, unspecified: Secondary | ICD-10-CM | POA: Diagnosis not present

## 2015-07-17 MED ORDER — LEVOFLOXACIN 500 MG PO TABS: 500.0000 mg | ORAL_TABLET | Freq: Every day | ORAL | Status: DC

## 2015-07-17 MED ORDER — PREDNISONE 20 MG PO TABS: ORAL_TABLET | ORAL | Status: DC

## 2015-07-17 MED ORDER — ALBUTEROL SULFATE HFA 108 (90 BASE) MCG/ACT IN AERS: 2.0000 | INHALATION_SPRAY | RESPIRATORY_TRACT | Status: DC | PRN

## 2015-07-17 MED ORDER — PROMETHAZINE-DM 6.25-15 MG/5ML PO SYRP: ORAL_SOLUTION | ORAL | Status: DC

## 2015-07-17 NOTE — Patient Instructions (Signed)
Please pick one of the over the counter allergy medications below and take it once daily for allergies.  Claritin or loratadine cheapest but likely the weakest  Zyrtec or certizine at night because it can make you sleepy The strongest is allegra or fexafinadine  Cheapest at [employer], [employer], [employer]  Sinusitis can be uncomfortable. People with sinusitis have congestion with yellow/green/gray discharge, sinus pain/pressure, pain around the eyes. Sinus infections almost ALWAYS stem from a viral infection and antibiotics don't work against a virus. Even when bacteria is responsible, the infections usually clear up on their own in a week or so.   PLEASE TRY TO DO OVER THE COUNTER TREATMENT AND PREDNISONE FOR 5-7 DAYS AND IF YOU ARE NOT GETTING BETTER OR GETTING WORSE THEN YOU CAN START ON AN ANTIBIOTIC GIVEN.  Can take the prednisone AT NIGHT WITH DINNER, it take 8-12 hours to start working so it will NOT affect your sleeping if you take it at night with your food!! Take two pills the first night and 1 or two pill the second night and then 1 pill the other nights.   Risk of antibiotic use: About 1 in 4 people who take antibiotics have side effects including stomach problems, dizziness, or rashes. Those problems clear up soon after stopping the drugs, but in rare cases antibiotics can cause severe allergic reaction. Over use of antibiotics also encourages the growth of bacteria that can't be controlled easily with drugs. That makes you more vunerable to antibiotic-resistant infections and undermines the benefits of antibiotics for others.   Waste of Money: Antibiotics often aren't very expensive, but any money spent on unnecessary drugs is money down the drain.   When are antibiotics needed? Only when symptoms last longer than a week.  Start to improve but then worsen again  -It can take up to 2 weeks to feel better.   -If you do not get better in 7-10 days (Have fever, facial pain, dental pain and  swelling), then please call the office and it is now appropriate to start an antibiotic.   -Please take Tylenol or Ibuprofen for pain. -Acetaminiphen 325mg orally every 4-6 hours for pain.  Max: 10 per day -Ibuprofen 200mg orally every 6-8 hours for pain.  Take with food to avoid ulcers.   Max 10 per day  Please pick one of the over the counter allergy medications below and take it once daily for allergies.  Claritin or loratadine cheapest but likely the weakest  Zyrtec or certizine at night because it can make you sleepy The strongest is allegra or fexafinadine  Cheapest at [employer], [employer], [employer]  -While drinking fluids, pinch and hold nose close and swallow.  This will help open up your eustachian tubes to drain the fluid behind your ear drums. -Try steam showers to open your nasal passages.   Drink lots of water to stay hydrated and to thin mucous.  Flonase/Nasonex is to help the inflammation.  Take 2 sprays in each nostril at bedtime.  Make sure you spray towards the outside of each nostril towards the outer corner of your eye, hold nose close and tilt head back.  This will help the medication get into your sinuses.  If you do not like this medication, then use saline nasal sprays same directions as above for Flonase. Stop the medication right away if you get blurring of your vision or nose bleeds.  Sinusitis Sinusitis is redness, soreness, and inflammation of the paranasal sinuses. Paranasal sinuses are air pockets   within the bones of your face (beneath the eyes, the middle of the forehead, or above the eyes). In healthy paranasal sinuses, mucus is able to drain out, and air is able to circulate through them by way of your nose. However, when your paranasal sinuses are inflamed, mucus and air can become trapped. This can allow bacteria and other germs to grow and cause infection. Sinusitis can develop quickly and last only a short time (acute) or continue over a long period (chronic).  Sinusitis that lasts for more than 12 weeks is considered chronic.  CAUSES  Causes of sinusitis include: Allergies. Structural abnormalities, such as displacement of the cartilage that separates your nostrils (deviated septum), which can decrease the air flow through your nose and sinuses and affect sinus drainage. Functional abnormalities, such as when the small hairs (cilia) that line your sinuses and help remove mucus do not work properly or are not present. SIGNS AND SYMPTOMS  Symptoms of acute and chronic sinusitis are the same. The primary symptoms are pain and pressure around the affected sinuses. Other symptoms include: Upper toothache. Earache. Headache. Bad breath. Decreased sense of smell and taste. A cough, which worsens when you are lying flat. Fatigue. Fever. Thick drainage from your nose, which often is green and may contain pus (purulent). Swelling and warmth over the affected sinuses. DIAGNOSIS  Your health care provider will perform a physical exam. During the exam, your health care provider may: Look in your nose for signs of abnormal growths in your nostrils (nasal polyps).  Tap over the affected sinus to check for signs of infection. View the inside of your sinuses (endoscopy) using an imaging device that has a light attached (endoscope). If your health care provider suspects that you have chronic sinusitis, one or more of the following tests may be recommended: Allergy tests. Nasal culture. A sample of mucus is taken from your nose, sent to a lab, and screened for bacteria. Nasal cytology. A sample of mucus is taken from your nose and examined by your health care provider to determine if your sinusitis is related to an allergy. TREATMENT  Most cases of acute sinusitis are related to a viral infection and will resolve on their own within 10 days. Sometimes medicines are prescribed to help relieve symptoms (pain medicine, decongestants, nasal steroid sprays, or saline  sprays).  However, for sinusitis related to a bacterial infection, your health care provider will prescribe antibiotic medicines. These are medicines that will help kill the bacteria causing the infection.  Rarely, sinusitis is caused by a fungal infection. In theses cases, your health care provider will prescribe antifungal medicine. For some cases of chronic sinusitis, surgery is needed. Generally, these are cases in which sinusitis recurs more than 3 times per year, despite other treatments. HOME CARE INSTRUCTIONS  Drink plenty of water. Water helps thin the mucus so your sinuses can drain more easily. Use a humidifier. Inhale steam 3 to 4 times a day (for example, sit in the bathroom with the shower running). Apply a warm, moist washcloth to your face 3 to 4 times a day, or as directed by your health care provider. Use saline nasal sprays to help moisten and clean your sinuses. Take medicines only as directed by your health care provider. If you were prescribed either an antibiotic or antifungal medicine, finish it all even if you start to feel better. SEEK IMMEDIATE MEDICAL CARE IF: You have increasing pain or severe headaches. You have nausea, vomiting, or drowsiness. You have   swelling around your face. You have vision problems. You have a stiff neck. You have difficulty breathing. MAKE SURE YOU:  Understand these instructions. Will watch your condition. Will get help right away if you are not doing well or get worse. Document Released: 08/30/2005 Document Revised: 01/14/2014 Document Reviewed: 09/14/2011 Exit[employer] Patient Information 2015 [employer]. This information is not intended to replace advice given to you by your health care provider. Make sure you discuss any questions you have with your health care provider.   

## 2015-07-17 NOTE — Progress Notes (Signed)
Subjective:    Patient ID: Bonnie Hall, female    DOB: 1961/02/11, 54 y.o.   MRN: [id number]  HPI 54 y.o. WF presents with cough.  Has had sore throat that has resolved with zpak, and now with lasting cough x 10 days. On OTC robitussin DM without help. Denies fever, chills.   Last menstrual period 03/12/2012.  Current Outpatient Prescriptions on File Prior to Visit  Medication Sig Dispense Refill  . ALPRAZolam (XANAX) 0.5 MG tablet Take 0.5 mg by mouth as needed.    Marland Kitchen aspirin EC 81 MG tablet Take 1 tablet (81 mg total) by mouth daily.    . butalbital-acetaminophen-caffeine (FIORICET, ESGIC) 50-325-40 MG per tablet Take one tablet every 12 hours as needed for pain 100 tablet 0  . cholecalciferol (VITAMIN D) 1000 UNITS tablet Take 4,000 Units by mouth daily.    Marland Kitchen escitalopram (LEXAPRO) 20 MG tablet Take 20 mg by mouth daily.    Marland Kitchen esomeprazole (NEXIUM) 40 MG capsule Take 1 capsule (40 mg total) by mouth 2 (two) times daily. 180 capsule 3  . estradiol-norethindrone (ACTIVELLA) 1-0.5 MG per tablet Take 1 tablet by mouth daily. 90 tablet 3  . Iron 66 MG TABS Take 1 tablet by mouth 2 (two) times daily.    Marland Kitchen loratadine (CLARITIN) 10 MG tablet Take 1 tablet (10 mg total) by mouth daily. 90 tablet 6  . meclizine (ANTIVERT) 50 MG tablet Take 0.5-1 tablets (25-50 mg total) by mouth 2 (two) times daily as needed. 30 tablet 0  . Melatonin 3 MG CAPS Take 6 capsules by mouth at bedtime.     . mometasone (NASONEX) 50 MCG/ACT nasal spray Place 2 sprays into the nose daily. 17 g 3  . montelukast (SINGULAIR) 10 MG tablet Take 1 tablet (10 mg total) by mouth at bedtime. 90 tablet 3  . Multiple Vitamins-Minerals (MULTIVITAMIN WITH MINERALS) tablet Take 1 tablet by mouth daily.    Marland Kitchen neomycin-polymyxin b-dexamethasone (MAXITROL) 3.5-10000-0.1 SUSP Place 1 drop into the right eye 4 (four) times daily. 5 mL 1  . pravastatin (PRAVACHOL) 40 MG tablet Take 1 tablet (40 mg total) by mouth daily. 90 tablet 4   . ranitidine (ZANTAC) 300 MG tablet Take 1 to 2 tablets daily for heartburn & reflux to allow wean and transition from PPI / Nexium 180 tablet 99  . traMADol (ULTRAM) 50 MG tablet Take 1 tablet (50 mg total) by mouth 2 (two) times daily as needed. for pain 60 tablet 0   No current facility-administered medications on file prior to visit.    Past Medical History  Diagnosis Date  . Blood transfusion 1990's  . Thrombocytosis ([place])   . Migraine   . Sleep apnea   . Allergy     Hay fever/exercise induced asthma  . Iron deficiency anemia 1990's    negative work up as to etiology; required iron infusion, blood transfusion.  Saw an hematologist  at [facility].   Marland Kitchen Anxiety   . Depression     bipolar  . GERD (gastroesophageal reflux disease)   . Hyperlipidemia   . Hypertension     Review of Systems  Constitutional: Positive for fatigue. Negative for fever and chills.  HENT: Positive for congestion, rhinorrhea and sinus pressure. Negative for dental problem, ear discharge, ear pain, nosebleeds, sore throat, trouble swallowing and voice change.   Respiratory: Positive for cough. Negative for apnea, choking, chest tightness, shortness of breath, wheezing and stridor.   Cardiovascular: Negative.   Gastrointestinal:  Negative.   Genitourinary: Negative.   Musculoskeletal: Negative.   Neurological: Negative.        Objective:   Physical Exam  Constitutional: She is oriented to person, place, and time. She appears well-developed and well-nourished.  HENT:  Head: Normocephalic and atraumatic.  Right Ear: External ear normal.  Left Ear: External ear normal.  Nose: Nose normal.  Mouth/Throat: Oropharynx is clear and moist.  Eyes: Conjunctivae are normal. Pupils are equal, round, and reactive to light.  Neck: Normal range of motion. Neck supple.  Cardiovascular: Normal rate and regular rhythm.   Pulmonary/Chest: Effort normal. No respiratory distress. She has no wheezes. She has no rales. She  exhibits no tenderness.  Abdominal: Soft. Bowel sounds are normal.  Lymphadenopathy:    She has no cervical adenopathy.  Neurological: She is alert and oriented to person, place, and time.  Skin: Skin is warm and dry.      Assessment & Plan:  Bronchitis/cough Lungs CTAB, no fever, chills, likely end of viral syndrome, will treat symptoms Prednisone, albuterol, allergy pill- if not better Monday can get levaquin

## 2015-07-24 ENCOUNTER — Other Ambulatory Visit: Payer: Self-pay | Admitting: Physician Assistant

## 2015-07-24 DIAGNOSIS — G44219 Episodic tension-type headache, not intractable: Secondary | ICD-10-CM

## 2015-07-24 MED ORDER — TRAMADOL HCL 50 MG PO TABS: 50.0000 mg | ORAL_TABLET | Freq: Two times a day (BID) | ORAL | Status: DC | PRN

## 2015-07-24 MED ORDER — ESOMEPRAZOLE MAGNESIUM 40 MG PO CPDR: 40.0000 mg | DELAYED_RELEASE_CAPSULE | Freq: Two times a day (BID) | ORAL | Status: DC

## 2015-08-05 ENCOUNTER — Ambulatory Visit: Payer: Self-pay | Admitting: Internal Medicine

## 2015-08-26 ENCOUNTER — Other Ambulatory Visit: Payer: Self-pay | Admitting: Physician Assistant

## 2015-08-26 MED ORDER — ESTRADIOL-NORETHINDRONE ACET 1-0.5 MG PO TABS: 1.0000 | ORAL_TABLET | Freq: Every day | ORAL | Status: DC

## 2015-11-27 ENCOUNTER — Other Ambulatory Visit: Payer: Self-pay

## 2015-11-27 MED ORDER — ESTRADIOL-NORETHINDRONE ACET 1-0.5 MG PO TABS: 1.0000 | ORAL_TABLET | Freq: Every day | ORAL | Status: DC

## 2015-12-05 ENCOUNTER — Other Ambulatory Visit: Payer: Self-pay | Admitting: Internal Medicine

## 2015-12-05 MED ORDER — ESTRADIOL-NORETHINDRONE ACET 1-0.5 MG PO TABS: 1.0000 | ORAL_TABLET | Freq: Every day | ORAL | Status: DC

## 2015-12-23 ENCOUNTER — Other Ambulatory Visit: Payer: Self-pay

## 2015-12-23 MED ORDER — MONTELUKAST SODIUM 10 MG PO TABS: 10.0000 mg | ORAL_TABLET | Freq: Every day | ORAL | Status: DC

## 2015-12-23 MED ORDER — PRAVASTATIN SODIUM 40 MG PO TABS: 40.0000 mg | ORAL_TABLET | Freq: Every day | ORAL | Status: DC

## 2015-12-31 ENCOUNTER — Other Ambulatory Visit: Payer: Self-pay

## 2015-12-31 MED ORDER — MONTELUKAST SODIUM 10 MG PO TABS: 10.0000 mg | ORAL_TABLET | Freq: Every day | ORAL | Status: DC

## 2015-12-31 MED ORDER — PRAVASTATIN SODIUM 40 MG PO TABS: 40.0000 mg | ORAL_TABLET | Freq: Every day | ORAL | Status: DC

## 2016-01-07 ENCOUNTER — Other Ambulatory Visit (HOSPITAL_COMMUNITY): Payer: Self-pay | Admitting: Obstetrics and Gynecology

## 2016-01-07 ENCOUNTER — Other Ambulatory Visit (HOSPITAL_COMMUNITY): Payer: Self-pay

## 2016-01-07 DIAGNOSIS — I158 Other secondary hypertension: Secondary | ICD-10-CM

## 2016-01-07 DIAGNOSIS — I159 Secondary hypertension, unspecified: Secondary | ICD-10-CM

## 2016-01-15 ENCOUNTER — Other Ambulatory Visit (HOSPITAL_COMMUNITY): Payer: Self-pay

## 2016-01-16 ENCOUNTER — Ambulatory Visit (HOSPITAL_COMMUNITY)
Admission: RE | Admit: 2016-01-16 | Discharge: 2016-01-16 | Disposition: A | Discharge status: Home / Self Care | Source: Ambulatory Visit | Attending: Obstetrics and Gynecology | Admitting: Obstetrics and Gynecology

## 2016-01-16 ENCOUNTER — Other Ambulatory Visit: Payer: Self-pay

## 2016-01-16 DIAGNOSIS — I158 Other secondary hypertension: Secondary | ICD-10-CM | POA: Diagnosis not present

## 2016-01-16 DIAGNOSIS — I071 Rheumatic tricuspid insufficiency: Secondary | ICD-10-CM | POA: Insufficient documentation

## 2016-01-16 DIAGNOSIS — I10 Essential (primary) hypertension: Secondary | ICD-10-CM | POA: Diagnosis not present

## 2016-01-16 NOTE — Progress Notes (Signed)
  Echocardiogram 2D Echocardiogram has been performed.  Bonnie Hall M 01/16/2016, 9:47 AM

## 2016-01-20 ENCOUNTER — Encounter: Payer: Self-pay | Admitting: Physician Assistant

## 2016-01-27 ENCOUNTER — Encounter: Payer: Self-pay | Admitting: Internal Medicine

## 2016-02-11 ENCOUNTER — Encounter: Payer: Self-pay | Admitting: Physician Assistant

## 2016-02-11 ENCOUNTER — Ambulatory Visit (INDEPENDENT_AMBULATORY_CARE_PROVIDER_SITE_OTHER): Admitting: Physician Assistant

## 2016-02-11 VITALS — BP 128/74 | HR 82 | Temp 97.5°F | Resp 16 | Ht 65.5 in | Wt 166.8 lb

## 2016-02-11 DIAGNOSIS — E785 Hyperlipidemia, unspecified: Secondary | ICD-10-CM

## 2016-02-11 DIAGNOSIS — D75839 Thrombocytosis, unspecified: Secondary | ICD-10-CM

## 2016-02-11 DIAGNOSIS — E559 Vitamin D deficiency, unspecified: Secondary | ICD-10-CM | POA: Diagnosis not present

## 2016-02-11 DIAGNOSIS — D509 Iron deficiency anemia, unspecified: Secondary | ICD-10-CM

## 2016-02-11 DIAGNOSIS — D473 Essential (hemorrhagic) thrombocythemia: Secondary | ICD-10-CM

## 2016-02-11 DIAGNOSIS — R7309 Other abnormal glucose: Secondary | ICD-10-CM | POA: Diagnosis not present

## 2016-02-11 DIAGNOSIS — R0989 Other specified symptoms and signs involving the circulatory and respiratory systems: Secondary | ICD-10-CM

## 2016-02-11 DIAGNOSIS — K219 Gastro-esophageal reflux disease without esophagitis: Secondary | ICD-10-CM | POA: Diagnosis not present

## 2016-02-11 DIAGNOSIS — T7840XD Allergy, unspecified, subsequent encounter: Secondary | ICD-10-CM

## 2016-02-11 DIAGNOSIS — Z79899 Other long term (current) drug therapy: Secondary | ICD-10-CM

## 2016-02-11 DIAGNOSIS — G4730 Sleep apnea, unspecified: Secondary | ICD-10-CM

## 2016-02-11 DIAGNOSIS — Z0000 Encounter for general adult medical examination without abnormal findings: Secondary | ICD-10-CM

## 2016-02-11 DIAGNOSIS — Z0001 Encounter for general adult medical examination with abnormal findings: Secondary | ICD-10-CM

## 2016-02-11 DIAGNOSIS — F419 Anxiety disorder, unspecified: Secondary | ICD-10-CM

## 2016-02-11 DIAGNOSIS — G43809 Other migraine, not intractable, without status migrainosus: Secondary | ICD-10-CM

## 2016-02-11 DIAGNOSIS — F3170 Bipolar disorder, currently in remission, most recent episode unspecified: Secondary | ICD-10-CM

## 2016-02-11 LAB — CBC WITH DIFFERENTIAL/PLATELET
Basophils Absolute: 72 cells/uL (ref 0–200)
Basophils Relative: 1 %
Eosinophils Absolute: 144 cells/uL (ref 15–500)
Eosinophils Relative: 2 %
HCT: 41.5 % (ref 35.0–45.0)
Hemoglobin: 13.5 g/dL (ref 11.7–15.5)
Lymphocytes Relative: 15 %
Lymphs Abs: 1080 cells/uL (ref 850–3900)
MCH: 31.2 pg (ref 27.0–33.0)
MCHC: 32.5 g/dL (ref 32.0–36.0)
MCV: 95.8 fL (ref 80.0–100.0)
MPV: 10.3 fL (ref 7.5–12.5)
Monocytes Absolute: 432 cells/uL (ref 200–950)
Monocytes Relative: 6 %
Neutro Abs: 5472 cells/uL (ref 1500–7800)
Neutrophils Relative %: 76 %
Platelets: 449 10*3/uL — ABNORMAL HIGH (ref 140–400)
RBC: 4.33 MIL/uL (ref 3.80–5.10)
RDW: 13.1 % (ref 11.0–15.0)
WBC: 7.2 10*3/uL (ref 3.8–10.8)

## 2016-02-11 LAB — HEPATIC FUNCTION PANEL
ALT: 10 U/L (ref 6–29)
AST: 13 U/L (ref 10–35)
Albumin: 4.4 g/dL (ref 3.6–5.1)
Alkaline Phosphatase: 49 U/L (ref 33–130)
Bilirubin, Direct: 0.1 mg/dL (ref <=0.2)
Indirect Bilirubin: 0.3 mg/dL (ref 0.2–1.2)
Total Bilirubin: 0.4 mg/dL (ref 0.2–1.2)
Total Protein: 6.7 g/dL (ref 6.1–8.1)

## 2016-02-11 LAB — HEMOGLOBIN A1C
Hgb A1c MFr Bld: 5.5 % (ref <5.7)
Mean Plasma Glucose: 111 mg/dL

## 2016-02-11 LAB — IRON AND TIBC
%SAT: 47 % (ref 11–50)
Iron: 144 ug/dL (ref 45–160)
TIBC: 308 ug/dL (ref 250–450)
UIBC: 164 ug/dL (ref 125–400)

## 2016-02-11 LAB — BASIC METABOLIC PANEL WITH GFR
BUN: 21 mg/dL (ref 7–25)
CO2: 25 mmol/L (ref 20–31)
Calcium: 9.6 mg/dL (ref 8.6–10.4)
Chloride: 104 mmol/L (ref 98–110)
Creat: 0.77 mg/dL (ref 0.50–1.05)
GFR, Est African American: >89 mL/min (ref >=60)
GFR, Est Non African American: 88 mL/min (ref >=60)
Glucose, Bld: 91 mg/dL (ref 65–99)
Potassium: 4.6 mmol/L (ref 3.5–5.3)
Sodium: 137 mmol/L (ref 135–146)

## 2016-02-11 LAB — LIPID PANEL
Cholesterol: 144 mg/dL (ref 125–200)
HDL: 52 mg/dL (ref >=46)
LDL Cholesterol: 71 mg/dL (ref <130)
Total CHOL/HDL Ratio: 2.8 Ratio (ref <=5.0)
Triglycerides: 104 mg/dL (ref <150)
VLDL: 21 mg/dL (ref <30)

## 2016-02-11 LAB — FERRITIN: Ferritin: 64 ng/mL (ref 10–232)

## 2016-02-11 LAB — TSH: TSH: 0.92 mIU/L

## 2016-02-11 LAB — MAGNESIUM: Magnesium: 2 mg/dL (ref 1.5–2.5)

## 2016-02-11 NOTE — Progress Notes (Signed)
Complete Physical  Assessment and Plan: 1. Labile hypertension - BASIC METABOLIC PANEL WITH GFR - Hepatic function panel - TSH - Urinalysis, Routine w reflex microscopic (not at [facility]) - Microalbumin / creatinine urine ratio  2. Hyperlipidemia - Lipid panel  3. Abnormal glucose - Hemoglobin A1c - Insulin, fasting  4. Thrombocytosis (HCC) - CBC with Differential/Platelet  5. Vitamin D deficiency - VITAMIN D 25 Hydroxy (Vit-D Deficiency, Fractures)  6. Medication management - Magnesium - Lamotrigine level  7. Iron deficiency anemia - Iron and TIBC - Ferritin  8. Sleep apnea Continue CPAP  9. Other migraine without status migrainosus, not intractable Avoid triggers  10. Gastroesophageal reflux disease, esophagitis presence not specified Try to stop PPI and switch to H2  11. 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 Caprice Beaver  12. Anxiety Continue follow up MD  13. Allergy, subsequent encounter Continue meds  14. Encounter for general adult medical examination with abnormal findings - CBC with Differential/Platelet - BASIC METABOLIC PANEL WITH GFR - Hepatic function panel - TSH - Lipid panel - Hemoglobin A1c - Insulin, fasting - Magnesium - VITAMIN D 25 Hydroxy (Vit-D Deficiency, Fractures) - Urinalysis, Routine w reflex microscopic (not at [facility]) - Microalbumin / creatinine urine ratio - Iron and TIBC - Ferritin  Discussed med's effects and SE's. Screening labs and tests as requested with regular follow-up as recommended. Over 40 minutes of exam, counseling, chart review, and complex, high level critical decision making was performed this visit.   HPI  55 y.o. female  presents for a complete physical and follow up for has Thrombocytosis ([place]); Iron deficiency anemia; Allergy; Anxiety; Sleep apnea; Migraine; Bipolar disorder (manic depression) ([place]); GERD (gastroesophageal reflux disease);  Hyperlipidemia; Vitamin D deficiency; Medication management; Labile hypertension; and Abnormal glucose on her problem list..  Her blood pressure has been controlled at home, today their BP is BP: 128/74 mmHg  She has history of OSA, can not tolerate CPAP mask and devices.  History of GERD on treatment, tried getting off but could not. Has history of iron def, has been seen by Dr. Lamonte Sakai in the past. Follows with Dr. Sherral Hammers for GYN. 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 Caprice Beaver. She had an echo for CP/SOB in May, and had normal EF 60% with grade 1 diastolic dysfunction.  Has twins, girl and boy, in 2nd grade.  She does not workout. She denies chest pain, shortness of breath, dizziness.  She is on cholesterol medication and denies myalgias. Her cholesterol is at goal. The cholesterol last visit was:   Lab Results  Component Value Date   CHOL 140 06/17/2015   HDL 40* 06/17/2015   LDLCALC 76 06/17/2015   TRIG 122 06/17/2015   CHOLHDL 3.5 06/17/2015   She has been working on diet and exercise for prediabetes, and denies paresthesia of the feet, polydipsia, polyuria and visual disturbances. Last A1C in the office was:  Lab Results  Component Value Date   HGBA1C 5.9* 06/17/2015  Patient is on Vitamin D supplement.   Lab Results  Component Value Date   VD25OH 45 06/17/2015     BMI is Body mass index is 27.32 kg/(m^2)., she is working on diet and exercise. Wt Readings from Last 3 Encounters:  02/11/16 166 lb 12.8 oz (75.66 kg)  07/17/15 183 lb (83.008 kg)  06/17/15 186 lb (84.369 kg)    Current Medications:  Current Outpatient Prescriptions on File Prior to Visit  Medication Sig Dispense Refill  . albuterol (VENTOLIN HFA) 108 (90 BASE) MCG/ACT inhaler Inhale 2 puffs into the lungs every 4 (four) hours as needed for wheezing or shortness of breath. 1 Inhaler 0  . ALPRAZolam (XANAX) 0.5 MG tablet Take 0.5 mg by mouth as needed.    Marland Kitchen aspirin EC 81 MG tablet Take 1 tablet (81 mg  total) by mouth daily.    . butalbital-acetaminophen-caffeine (FIORICET, ESGIC) 50-325-40 MG per tablet Take one tablet every 12 hours as needed for pain 100 tablet 0  . cholecalciferol (VITAMIN D) 1000 UNITS tablet Take 4,000 Units by mouth daily.    Marland Kitchen esomeprazole (NEXIUM) 40 MG capsule Take 1 capsule (40 mg total) by mouth 2 (two) times daily. 180 capsule 3  . estradiol-norethindrone (MIMVEY) 1-0.5 MG tablet Take 1 tablet by mouth daily. 84 tablet 1  . Iron 66 MG TABS Take 1 tablet by mouth 2 (two) times daily.    Marland Kitchen loratadine (CLARITIN) 10 MG tablet Take 1 tablet (10 mg total) by mouth daily. 90 tablet 6  . Melatonin 3 MG CAPS Take 6 capsules by mouth at bedtime.     . montelukast (SINGULAIR) 10 MG tablet Take 1 tablet (10 mg total) by mouth at bedtime. 90 tablet 3  . Multiple Vitamins-Minerals (MULTIVITAMIN WITH MINERALS) tablet Take 1 tablet by mouth daily.    . pravastatin (PRAVACHOL) 40 MG tablet Take 1 tablet (40 mg total) by mouth daily. 90 tablet 4  . ranitidine (ZANTAC) 300 MG tablet Take 1 to 2 tablets daily for heartburn & reflux to allow wean and transition from PPI / Nexium 180 tablet 99  . traMADol (ULTRAM) 50 MG tablet Take 1 tablet (50 mg total) by mouth 2 (two) times daily as needed. for pain 180 tablet 0  . mometasone (NASONEX) 50 MCG/ACT nasal spray Place 2 sprays into the nose daily. 17 g 3   No current facility-administered medications on file prior to visit.   Health Maintenance:   Immunization History  Administered Date(s) Administered  . Influenza Split 07/10/2013, 07/04/2014, 06/17/2015  . PPD Test 01/14/2014, 01/16/2015  . Pneumococcal Polysaccharide-23 12/31/2011  . Tdap 12/28/2010   TDAP: 2012 Pneumovax:2013 Prevnar 13: N/A Flu vaccine:2016 Zostavax: can call insurance Patient's last menstrual period was 03/12/2012. Pap: 12/2015 states had EKG at GYN MGM: 2015 DUE DEXA: N/A Colonoscopy: 2013 Echo 01/2016  Patient Care Team: Unk Pinto, MD as  PCP - General (Internal Medicine) Dian Queen, MD as Attending Physician (Obstetrics and Gynecology) Inda Castle, MD as Consulting Physician (Gastroenterology) Heath Lark, MD as Consulting Physician (Hematology and Oncology)  Allergies: No Known Allergies Medical History:  Past Medical History  Diagnosis Date  . Blood transfusion 1990's  . Thrombocytosis ([place])   . Migraine   . Sleep apnea   . Allergy     Hay fever/exercise induced asthma  . Iron deficiency anemia 1990's    negative work up as to etiology; required iron infusion, blood transfusion.  Saw an hematologist  at [facility].   Marland Kitchen Anxiety   . Depression     bipolar  . GERD (gastroesophageal reflux disease)   . Hyperlipidemia   . Hypertension    Surgical History:  Past Surgical History  Procedure Laterality Date  . Nasal septum surgery  1983  . Tonsillectomy  1966  . Cesarean section  2009  . Colonoscopy  06/2012    neg. with [place]   Family History:  Family History  Problem Relation Age of Onset  .  Colon cancer Paternal Uncle 74  . Colon cancer Paternal Uncle 75  . Prostate cancer Mother 31  . Breast cancer Paternal Aunt    Social History:  Social History  Substance Use Topics  . Smoking status: Never Smoker   . Smokeless tobacco: Never Used  . Alcohol Use: No    Review of Systems: Review of Systems  Constitutional: Negative.   HENT: Negative.   Eyes: Negative.   Respiratory: Negative.   Cardiovascular: Negative.   Gastrointestinal: Negative.   Genitourinary: Negative.   Musculoskeletal: Negative.   Skin: Negative.   Neurological: Negative.   Endo/Heme/Allergies: Negative.   Psychiatric/Behavioral: Negative.     Physical Exam: Estimated body mass index is 27.32 kg/(m^2) as calculated from the following:   Height as of this encounter: 5' 5.5" (1.664 m).   Weight as of this encounter: 166 lb 12.8 oz (75.66 kg). BP 128/74 mmHg  Pulse 82  Temp(Src) 97.5 F (36.4 C) (Temporal)  Resp 16   Ht 5' 5.5" (1.664 m)  Wt 166 lb 12.8 oz (75.66 kg)  BMI 27.32 kg/m2  SpO2 98%  LMP 03/12/2012 General Appearance: Well nourished, in no apparent distress.  Eyes: PERRLA, EOMs, conjunctiva no swelling or erythema, normal fundi and vessels.  Sinuses: No Frontal/maxillary tenderness  ENT/Mouth: Ext aud canals clear, normal light reflex with TMs without erythema, bulging. Good dentition. No erythema, swelling, or exudate on post pharynx. Tonsils not swollen or erythematous. Hearing normal.  Neck: Supple, thyroid normal. No bruits  Respiratory: Respiratory effort normal, BS equal bilaterally without rales, rhonchi, wheezing or stridor.  Cardio: RRR without murmurs, rubs or gallops. Brisk peripheral pulses without edema.  Chest: symmetric, with normal excursions and percussion.  Breasts: defer Abdomen: Soft, nontender, no guarding, rebound, hernias, masses, or organomegaly.  Lymphatics: Non tender without lymphadenopathy.  Genitourinary: defer Musculoskeletal: Full ROM all peripheral extremities,5/5 strength, and normal gait.  Skin: Warm, dry without rashes, lesions, ecchymosis. Neuro: Cranial nerves intact, reflexes equal bilaterally. Normal muscle tone, no cerebellar symptoms. Sensation intact.  Psych: Awake and oriented X 3, normal affect, Insight and Judgment appropriate.   EKG: defer AORTA SCAN: defer  Vicie Mutters 9:37 AM [facility]

## 2016-02-12 LAB — MICROALBUMIN / CREATININE URINE RATIO
Creatinine, Urine: 191 mg/dL (ref 20–320)
Microalb Creat Ratio: 6 mcg/mg creat (ref <30)
Microalb, Ur: 1.2 mg/dL

## 2016-02-12 LAB — URINALYSIS, ROUTINE W REFLEX MICROSCOPIC
Bilirubin Urine: NEGATIVE
Glucose, UA: NEGATIVE
Ketones, ur: NEGATIVE
Leukocytes, UA: NEGATIVE
Nitrite: NEGATIVE
Protein, ur: NEGATIVE
Specific Gravity, Urine: 1.035 (ref 1.001–1.035)
pH: 5 (ref 5.0–8.0)

## 2016-02-12 LAB — URINALYSIS, MICROSCOPIC ONLY
Bacteria, UA: NONE SEEN [HPF]
Casts: NONE SEEN [LPF]
Crystals: NONE SEEN [HPF]
Yeast: NONE SEEN [HPF]

## 2016-02-12 LAB — INSULIN, FASTING: Insulin fasting, serum: 16.6 u[IU]/mL (ref 2.0–19.6)

## 2016-02-12 LAB — VITAMIN D 25 HYDROXY (VIT D DEFICIENCY, FRACTURES): Vit D, 25-Hydroxy: 43 ng/mL (ref 30–100)

## 2016-02-14 LAB — LAMOTRIGINE LEVEL: Lamotrigine Lvl: 2.1 ug/mL — ABNORMAL LOW (ref 4.0–18.0)

## 2016-04-06 ENCOUNTER — Other Ambulatory Visit: Payer: Self-pay | Admitting: Physician Assistant

## 2016-04-06 DIAGNOSIS — G44219 Episodic tension-type headache, not intractable: Secondary | ICD-10-CM

## 2016-04-06 MED ORDER — BUTALBITAL-APAP-CAFFEINE 50-325-40 MG PO TABS: ORAL_TABLET | ORAL | 0 refills | Status: DC

## 2016-04-30 ENCOUNTER — Telehealth: Payer: Self-pay | Admitting: Internal Medicine

## 2016-04-30 MED ORDER — ERYTHROMYCIN 5 MG/GM OP OINT: TOPICAL_OINTMENT | Freq: Three times a day (TID) | OPHTHALMIC | 0 refills | Status: AC

## 2016-04-30 NOTE — Telephone Encounter (Signed)
Patient called reporting red eye with itching.  No drainage.  Will send in romycin.  May also be allergies.  Can try an over the counter antihistamine like claritin, zyrtec or allegra.  If no improvement needs to be seen.

## 2016-05-13 ENCOUNTER — Ambulatory Visit: Payer: Self-pay | Admitting: Physician Assistant

## 2016-09-09 ENCOUNTER — Other Ambulatory Visit: Payer: Self-pay | Admitting: Physician Assistant

## 2016-09-09 ENCOUNTER — Encounter: Payer: Self-pay | Admitting: Physician Assistant

## 2016-09-09 MED ORDER — AZITHROMYCIN 250 MG PO TABS: ORAL_TABLET | ORAL | 1 refills | Status: AC

## 2016-09-09 MED ORDER — ALBUTEROL SULFATE HFA 108 (90 BASE) MCG/ACT IN AERS: 2.0000 | INHALATION_SPRAY | RESPIRATORY_TRACT | 0 refills | Status: DC | PRN

## 2016-10-08 LAB — OB RESULTS CONSOLE HEPATITIS B SURFACE ANTIGEN: Hepatitis B Surface Ag: NEGATIVE

## 2016-10-08 LAB — OB RESULTS CONSOLE GC/CHLAMYDIA
Chlamydia: NEGATIVE
Gonorrhea: NEGATIVE

## 2016-10-08 LAB — OB RESULTS CONSOLE RPR: RPR: NONREACTIVE

## 2016-10-08 LAB — OB RESULTS CONSOLE ANTIBODY SCREEN: Antibody Screen: NEGATIVE

## 2016-10-08 LAB — OB RESULTS CONSOLE ABO/RH: RH Type: POSITIVE

## 2016-10-08 LAB — OB RESULTS CONSOLE RUBELLA ANTIBODY, IGM: Rubella: IMMUNE

## 2016-10-08 LAB — OB RESULTS CONSOLE HIV ANTIBODY (ROUTINE TESTING): HIV: NONREACTIVE

## 2016-12-06 ENCOUNTER — Emergency Department (HOSPITAL_COMMUNITY)
Admission: EM | Admit: 2016-12-06 | Discharge: 2016-12-06 | Disposition: A | Discharge status: Home / Self Care | Attending: Emergency Medicine | Admitting: Emergency Medicine

## 2016-12-06 ENCOUNTER — Encounter (HOSPITAL_COMMUNITY): Payer: Self-pay

## 2016-12-06 DIAGNOSIS — O4692 Antepartum hemorrhage, unspecified, second trimester: Secondary | ICD-10-CM | POA: Insufficient documentation

## 2016-12-06 DIAGNOSIS — Z5321 Procedure and treatment not carried out due to patient leaving prior to being seen by health care provider: Secondary | ICD-10-CM | POA: Insufficient documentation

## 2016-12-06 DIAGNOSIS — N939 Abnormal uterine and vaginal bleeding, unspecified: Secondary | ICD-10-CM | POA: Diagnosis not present

## 2016-12-06 DIAGNOSIS — Z7982 Long term (current) use of aspirin: Secondary | ICD-10-CM | POA: Diagnosis not present

## 2016-12-06 DIAGNOSIS — Z3A25 25 weeks gestation of pregnancy: Secondary | ICD-10-CM | POA: Insufficient documentation

## 2016-12-06 DIAGNOSIS — I10 Essential (primary) hypertension: Secondary | ICD-10-CM | POA: Diagnosis not present

## 2016-12-06 DIAGNOSIS — Z79899 Other long term (current) drug therapy: Secondary | ICD-10-CM | POA: Diagnosis not present

## 2016-12-06 LAB — URINALYSIS, ROUTINE W REFLEX MICROSCOPIC
Bacteria, UA: NONE SEEN
Bilirubin Urine: NEGATIVE
Glucose, UA: NEGATIVE mg/dL
Ketones, ur: NEGATIVE mg/dL
Leukocytes, UA: NEGATIVE
Nitrite: NEGATIVE
Protein, ur: NEGATIVE mg/dL
Specific Gravity, Urine: 1.032 — ABNORMAL HIGH (ref 1.005–1.030)
pH: 5 (ref 5.0–8.0)

## 2016-12-06 LAB — HCG, QUANTITATIVE, PREGNANCY: hCG, Beta Chain, Quant, S: 44903 m[IU]/mL — ABNORMAL HIGH (ref <5)

## 2016-12-06 NOTE — ED Notes (Signed)
Pt reports she can no longer wait for a room and states she will call her OBGYN in the morning. Risks of leaving AMA explained to patient including risk of worsening condition and possible death. Pt verbally understood these risks of leaving AMA. Pt encouraged to return to ER for worsening or new symptoms. Pt observed ambulating out of ED with independent steady gait.

## 2016-12-06 NOTE — ED Triage Notes (Signed)
Pt is [redacted] weeks pregnant and began having bright red vaginal bleeding today with some clots Denies pain at this time other than on and off "cramping" x several weeks. Pt spoke with OBGYN who instructed to come here.

## 2016-12-07 ENCOUNTER — Inpatient Hospital Stay (HOSPITAL_COMMUNITY)

## 2016-12-07 ENCOUNTER — Encounter (HOSPITAL_COMMUNITY): Payer: Self-pay | Admitting: *Deleted

## 2016-12-07 ENCOUNTER — Inpatient Hospital Stay (HOSPITAL_COMMUNITY)
Admission: AD | Admit: 2016-12-07 | Discharge: 2016-12-07 | Disposition: A | Discharge status: Home / Self Care | Source: Ambulatory Visit | Attending: Obstetrics and Gynecology | Admitting: Obstetrics and Gynecology

## 2016-12-07 DIAGNOSIS — O26852 Spotting complicating pregnancy, second trimester: Secondary | ICD-10-CM

## 2016-12-07 DIAGNOSIS — Z3A24 24 weeks gestation of pregnancy: Secondary | ICD-10-CM | POA: Diagnosis not present

## 2016-12-07 DIAGNOSIS — O4692 Antepartum hemorrhage, unspecified, second trimester: Secondary | ICD-10-CM | POA: Insufficient documentation

## 2016-12-07 DIAGNOSIS — Z7982 Long term (current) use of aspirin: Secondary | ICD-10-CM | POA: Diagnosis not present

## 2016-12-07 LAB — URINALYSIS, ROUTINE W REFLEX MICROSCOPIC
Bilirubin Urine: NEGATIVE
Glucose, UA: NEGATIVE mg/dL
Hgb urine dipstick: NEGATIVE
Ketones, ur: NEGATIVE mg/dL
Leukocytes, UA: NEGATIVE
Nitrite: NEGATIVE
Protein, ur: NEGATIVE mg/dL
Specific Gravity, Urine: 1.013 (ref 1.005–1.030)
pH: 7 (ref 5.0–8.0)

## 2016-12-07 LAB — WET PREP, GENITAL
Clue Cells Wet Prep HPF POC: NONE SEEN
Sperm: NONE SEEN
Trich, Wet Prep: NONE SEEN
Yeast Wet Prep HPF POC: NONE SEEN

## 2016-12-07 NOTE — Progress Notes (Signed)
Strip reviewed by Jorje Guild NP while pt in u/s. OK to leave pt off EFM when returns from u/s

## 2016-12-07 NOTE — MAU Note (Signed)
Urine in lab 

## 2016-12-07 NOTE — Discharge Instructions (Signed)
Vaginal Bleeding During Pregnancy, Second Trimester A small amount of bleeding (spotting) from the vagina is relatively common in pregnancy. It usually stops on its own. Various things can cause bleeding or spotting in pregnancy. Some bleeding may be related to the pregnancy, and some may not. Sometimes the bleeding is normal and is not a problem. However, bleeding can also be a sign of something serious. Be sure to tell your health care provider about any vaginal bleeding right away. Some possible causes of vaginal bleeding during the second trimester include:  Infection, inflammation, or growths on the cervix.  The placenta may be partially or completely covering the opening of the cervix inside the uterus (placenta previa).  The placenta may have separated from the uterus (abruption of the placenta).  You may be having early (preterm) labor.  The cervix may not be strong enough to keep a baby inside the uterus (cervical insufficiency).  Tiny cysts may have developed in the uterus instead of pregnancy tissue (molar pregnancy). Follow these instructions at home: Watch your condition for any changes. The following actions may help to lessen any discomfort you are feeling:  Follow your health care provider's instructions for limiting your activity. If your health care provider orders bed rest, you may need to stay in bed and only get up to use the bathroom. However, your health care provider may allow you to continue light activity.  If needed, make plans for someone to help with your regular activities and responsibilities while you are on bed rest.  Keep track of the number of pads you use each day, how often you change pads, and how soaked (saturated) they are. Write this down.  Do not use tampons. Do not douche.  Do not have sexual intercourse or orgasms until approved by your health care provider.  If you pass any tissue from your vagina, save the tissue so you can show it to your  health care provider.  Only take over-the-counter or prescription medicines as directed by your health care provider.  Do not take aspirin because it can make you bleed.  Do not exercise or perform any strenuous activities or heavy lifting without your health care provider's permission.  Keep all follow-up appointments as directed by your health care provider. Contact a health care provider if:  You have any vaginal bleeding during any part of your pregnancy.  You have cramps or labor pains.  You have a fever, not controlled by medicine. Get help right away if:  You have severe cramps in your back or belly (abdomen).  You have contractions.  You have chills.  You pass large clots or tissue from your vagina.  Your bleeding increases.  You feel light-headed or weak, or you have fainting episodes.  You are leaking fluid or have a gush of fluid from your vagina. This information is not intended to replace advice given to you by your health care provider. Make sure you discuss any questions you have with your health care provider. Document Released: 06/09/2005 Document Revised: 02/05/2016 Document Reviewed: 05/07/2013 Elsevier Interactive Patient Education  2017 [employer].

## 2016-12-07 NOTE — MAU Note (Signed)
Pt presents to MAU with complaints of vaginal bleeding yesterday and went to [facility] ED last evening and was not evaluated due to waiting 3 hours. Pt states vaginal bleeding has subsided today.

## 2016-12-07 NOTE — MAU Provider Note (Signed)
History     CSN: [id number]  Arrival date and time: 12/07/16 1041   First Provider Initiated Contact with Patient 12/07/16 1128      Chief Complaint  Patient presents with  . Vaginal Bleeding   HPI  Bonnie Hall is a 56 y.o. G2P0102 at [redacted]w[redacted]d who presents with vaginal bleeding. Reports episode of vaginal bleeding into her underwear that occurred last night. Saw streaks of pink & light red blood on her underwear & toilet paper last night; episode lasted for 30 minutes. Did not saturated pad or pass clots. Went to [facility] last night but left after waiting for 3 hours. Denies n/v/d, abdominal pain, LOF, recent abdominal trauma/fall/MVA, or recent intercourse. Positive fetal movement. Denies complications with this pregnancy so far.   OB History    Gravida Para Term Preterm AB Living   2 1   1   2    SAB TAB Ectopic Multiple Live Births         1 2      Past Medical History:  Diagnosis Date  . Allergy    Hay fever/exercise induced asthma  . Anxiety   . Blood transfusion 1990's  . Depression    bipolar  . GERD (gastroesophageal reflux disease)   . Hyperlipidemia   . Hypertension   . Iron deficiency anemia 1990's   negative work up as to etiology; required iron infusion, blood transfusion.  Saw an hematologist  at [facility].   . Migraine   . Sleep apnea   . Thrombocytosis ([place])     Past Surgical History:  Procedure Laterality Date  . CESAREAN SECTION  2009  . COLONOSCOPY  06/2012   neg. with Boron  . NASAL SEPTUM SURGERY  1983  . TONSILLECTOMY  1966    Family History  Problem Relation Age of Onset  . Colon cancer Paternal Uncle 86  . Colon cancer Paternal Uncle 20  . Prostate cancer Mother 19  . Breast cancer Paternal Aunt     Social History  Substance Use Topics  . Smoking status: Never Smoker  . Smokeless tobacco: Never Used  . Alcohol use No    Allergies: No Known Allergies  Prescriptions Prior to Admission  Medication Sig Dispense Refill Last Dose  .  albuterol (VENTOLIN HFA) 108 (90 Base) MCG/ACT inhaler Inhale 2 puffs into the lungs every 4 (four) hours as needed for wheezing or shortness of breath. 1 Inhaler 0 Past Month at Unknown time  . ALPRAZolam (XANAX) 0.5 MG tablet Take 0.5 mg by mouth as needed.   Past Month at Unknown time  . aspirin EC 81 MG tablet Take 1 tablet (81 mg total) by mouth daily.   12/06/2016 at Unknown time  . butalbital-acetaminophen-caffeine (FIORICET, ESGIC) 50-325-40 MG tablet Take one tablet every 12 hours as needed for pain 100 tablet 0 Past Week at Unknown time  . esomeprazole (NEXIUM) 40 MG capsule Take 1 capsule (40 mg total) by mouth 2 (two) times daily. 180 capsule 3 12/06/2016 at Unknown time  . folic acid (FOLVITE) 263 MCG tablet Take 400 mcg by mouth daily.   12/06/2016 at Unknown time  . lamoTRIgine (LAMICTAL) 150 MG tablet Take 150 mg by mouth daily.   12/06/2016 at Unknown time  . loratadine (CLARITIN) 10 MG tablet Take 1 tablet (10 mg total) by mouth daily. 90 tablet 6 Past Month at Unknown time  . Lurasidone HCl (LATUDA PO) Take 10 mg by mouth daily.    12/06/2016 at Unknown time  .  Melatonin 3 MG CAPS Take 2 capsules by mouth at bedtime.    12/06/2016 at Unknown time  . montelukast (SINGULAIR) 10 MG tablet Take 1 tablet (10 mg total) by mouth at bedtime. 90 tablet 3 12/06/2016 at Unknown time  . Prenatal Vit-Fe Fumarate-FA (PRENATAL MULTIVITAMIN) TABS tablet Take 1 tablet by mouth daily at 12 noon.   12/06/2016 at Unknown time  . vortioxetine HBr (TRINTELLIX) 20 MG TABS Take by mouth daily.   12/06/2016 at Unknown time  . estradiol-norethindrone (MIMVEY) 1-0.5 MG tablet Take 1 tablet by mouth daily. (Patient not taking: Reported on 12/07/2016) 84 tablet 1 Not Taking at Unknown time  . mometasone (NASONEX) 50 MCG/ACT nasal spray Place 2 sprays into the nose daily. (Patient not taking: Reported on 12/07/2016) 17 g 3 Not Taking at Unknown time  . pravastatin (PRAVACHOL) 40 MG tablet Take 1 tablet (40 mg total) by  mouth daily. (Patient not taking: Reported on 12/07/2016) 90 tablet 4 Not Taking at Unknown time  . ranitidine (ZANTAC) 300 MG tablet Take 1 to 2 tablets daily for heartburn & reflux to allow wean and transition from PPI / Nexium (Patient not taking: Reported on 12/07/2016) 180 tablet 99 Not Taking at Unknown time  . traMADol (ULTRAM) 50 MG tablet Take 1 tablet (50 mg total) by mouth 2 (two) times daily as needed. for pain (Patient not taking: Reported on 12/07/2016) 180 tablet 0 Not Taking at Unknown time    Review of Systems  Gastrointestinal: Positive for constipation. Negative for abdominal pain, diarrhea, nausea and vomiting.  Genitourinary: Positive for vaginal bleeding and vaginal discharge. Negative for dysuria.   Physical Exam   Blood pressure 108/61, pulse 86, temperature 97.9 F (36.6 C), resp. rate 18, height 5\' 6"  (1.676 m), weight 191 lb (86.6 kg).  Physical Exam  Nursing note and vitals reviewed. Constitutional: She is oriented to person, place, and time. She appears well-developed and well-nourished. No distress.  HENT:  Head: Normocephalic and atraumatic.  Eyes: Conjunctivae are normal. Right eye exhibits no discharge. Left eye exhibits no discharge. No scleral icterus.  Neck: Normal range of motion.  Cardiovascular: Normal rate, regular rhythm and normal heart sounds.   No murmur heard. Respiratory: Effort normal and breath sounds normal. No respiratory distress. She has no wheezes.  GI: Soft. Bowel sounds are normal. There is no tenderness.  Genitourinary: Cervix exhibits no friability. No bleeding in the vagina. Vaginal discharge (small amount of thin white discharge) found.  Genitourinary Comments: Cervix closed/thick/posterior  Neurological: She is alert and oriented to person, place, and time.  Skin: Skin is warm and dry. She is not diaphoretic.  Psychiatric: She has a normal mood and affect. Her behavior is normal. Judgment and thought content normal.     Fetal  Tracing:  Baseline: 150 Variability: moderate Accelerations: 10x10 Decelerations: variable  Toco: none MAU Course  Procedures Results for orders placed or performed during the hospital encounter of 12/07/16 (from the past 24 hour(s))  Urinalysis, Routine w reflex microscopic     Status: Abnormal   Collection Time: 12/07/16 10:44 AM  Result Value Ref Range   Color, Urine YELLOW YELLOW   APPearance CLOUDY (A) CLEAR   Specific Gravity, Urine 1.013 1.005 - 1.030   pH 7.0 5.0 - 8.0   Glucose, UA NEGATIVE NEGATIVE mg/dL   Hgb urine dipstick NEGATIVE NEGATIVE   Bilirubin Urine NEGATIVE NEGATIVE   Ketones, ur NEGATIVE NEGATIVE mg/dL   Protein, ur NEGATIVE NEGATIVE mg/dL   Nitrite NEGATIVE  NEGATIVE   Leukocytes, UA NEGATIVE NEGATIVE  Wet prep, genital     Status: Abnormal   Collection Time: 12/07/16 12:40 PM  Result Value Ref Range   Yeast Wet Prep HPF POC NONE SEEN NONE SEEN   Trich, Wet Prep NONE SEEN NONE SEEN   Clue Cells Wet Prep HPF POC NONE SEEN NONE SEEN   WBC, Wet Prep HPF POC FEW (A) NONE SEEN   Sperm NONE SEEN    [place] Mfm Ob Limited  Result Date: 12/07/2016 ----------------------------------------------------------------------  OBSTETRICS REPORT                      (Signed Final 12/07/2016 01:10 pm) ---------------------------------------------------------------------- Patient Info  ID #:       [id number]                         D.O.B.:   March 08, 2061 (56 yrs)  Name:       Bonnie Hall            Visit Date:  12/07/2016 12:08 pm ---------------------------------------------------------------------- Performed By  Performed By:     Rodrigo Ran BS      Ref. Address:     [address]                    [address] [address]                                                             [address]  Attending:        Silvestre Moment [place]        Secondary Phy.:   MAU Nursing-                    MD                                                             MAU/Triage  Referred By:      Jorje Guild           Location:         [facility]                    [facility] ---------------------------------------------------------------------- Orders   #  Description                                 Code   1  [place] MFM OB LIMITED                           [id number]  ----------------------------------------------------------------------   #  Ordered By               Order #        Accession #    Episode #   1  Jorje Guild            [id number]      [id number]     [id number]  ---------------------------------------------------------------------- Indications  [redacted] weeks gestation of pregnancy                Z3A.24   Uterine fibroids affecting pregnancy in        O34.12, D25.9   second trimester, antepartum   Vaginal bleeding in pregnancy, second          O46.92   trimester  ---------------------------------------------------------------------- OB History  Gravidity:    2         Term:   0        Prem:   1        SAB:   0  TOP:          0       Ectopic:  0        Living: 2 ---------------------------------------------------------------------- Fetal Evaluation  Num Of Fetuses:     1  Fetal Heart         138  Rate(bpm):  Cardiac Activity:   Observed  Presentation:       Cephalic  Placenta:           Posterior, above cervical os  P. Cord Insertion:  Not well visualized  Amniotic Fluid  AFI FV:      Subjectively within normal limits                              Largest Pocket(cm)                              5.19  Comment:    No placental abruption or previa identified. ---------------------------------------------------------------------- Gestational Age  Best:          24w 0d    Det. ByLoman Chroman         EDD:   03/29/17                                      (09/17/16) ---------------------------------------------------------------------- Cervix Uterus Adnexa  Cervix  Length:           3.87  cm.  Normal appearance by transabdominal scan.  Uterus  Single fibroid noted, see table below.  Left Ovary  Not visualized.  Right Ovary   Not visualized.  Cul De Sac:   No free fluid seen.  Adnexa:       No abnormality visualized. ---------------------------------------------------------------------- Myomas   Site                     L(cm)      W(cm)      D(cm)      Location   Posterior LUS            4.6        4.6        4  ----------------------------------------------------------------------   Blood Flow                 RI        PI       Comments  ---------------------------------------------------------------------- Impression  Single IUP at 24w 0d  Limited ultrasound performed due to vaginal bleeding  Posterior placenta without previa noted.  No sonographic findings suggestive of abruption were noted  A posterior lower uterine segment myoma is noted as  described above  Normal amniotic fluid volume ---------------------------------------------------------------------- Recommendations  Follow-up ultrasounds as clinically indicated. ----------------------------------------------------------------------                Kerry Kass, MD Electronically Signed Final Report   12/07/2016 01:10 pm ----------------------------------------------------------------------   MDM Fetal tracing appropriate for gestation No ctx palpated or appreciated on TOCO Per prenatal record, pt had low lying placenta -- pt unaware of this  Ultrasound today shows posterior placenta without previa, no evidence of abruption, & a 4 cm myoma in LUS SSE -- cervix with normal appearance, no bleeding Cervix closed A positive (per prenatal record) Pt requesting to be discharged in time to pick up her children from school. S/w Dr. Corinna Capra. Discussed exam, FHT, & imaging. Will discharge home with close follow up in office & precautions. Modified rest.  Assessment and Plan  A: 1. [redacted] weeks gestation of pregnancy   2. Vaginal bleeding in pregnancy, second trimester    P: Discharge home Modified rest Call office for f/u by Thursday Return to MAU for any vaginal  bleeding or abdominal pain. Call office for any concerns   Jorje Guild 12/07/2016, 11:28 AM

## 2016-12-07 NOTE — Progress Notes (Signed)
Baby active 

## 2016-12-07 NOTE — Progress Notes (Signed)
Bonnie Guild Np in earlier to discuss test results and d/c plan. Written and verbal d/c instructions given and understanding voiced

## 2016-12-07 NOTE — Progress Notes (Signed)
Transducer adj

## 2016-12-23 ENCOUNTER — Other Ambulatory Visit (HOSPITAL_COMMUNITY): Payer: Self-pay

## 2016-12-23 DIAGNOSIS — R0602 Shortness of breath: Secondary | ICD-10-CM

## 2016-12-24 ENCOUNTER — Ambulatory Visit (HOSPITAL_COMMUNITY)
Admission: RE | Admit: 2016-12-24 | Discharge: 2016-12-24 | Disposition: A | Discharge status: Home / Self Care | Source: Ambulatory Visit | Attending: Internal Medicine | Admitting: Internal Medicine

## 2016-12-24 DIAGNOSIS — R0602 Shortness of breath: Secondary | ICD-10-CM | POA: Diagnosis not present

## 2016-12-24 DIAGNOSIS — O09529 Supervision of elderly multigravida, unspecified trimester: Secondary | ICD-10-CM | POA: Insufficient documentation

## 2016-12-24 DIAGNOSIS — Z3A00 Weeks of gestation of pregnancy not specified: Secondary | ICD-10-CM | POA: Diagnosis not present

## 2016-12-24 DIAGNOSIS — O26899 Other specified pregnancy related conditions, unspecified trimester: Secondary | ICD-10-CM | POA: Diagnosis not present

## 2017-01-05 ENCOUNTER — Ambulatory Visit (INDEPENDENT_AMBULATORY_CARE_PROVIDER_SITE_OTHER): Admitting: Emergency Medicine

## 2017-01-05 ENCOUNTER — Telehealth: Payer: Self-pay | Admitting: Emergency Medicine

## 2017-01-05 ENCOUNTER — Encounter: Payer: Self-pay | Admitting: Emergency Medicine

## 2017-01-05 VITALS — BP 112/80 | HR 84 | Ht 65.5 in | Wt 194.0 lb

## 2017-01-05 DIAGNOSIS — R0602 Shortness of breath: Secondary | ICD-10-CM

## 2017-01-05 MED ORDER — FLUTICASONE PROPIONATE 50 MCG/ACT NA SUSP: 2.0000 | Freq: Every day | NASAL | 2 refills | Status: DC

## 2017-01-05 NOTE — Patient Instructions (Addendum)
Make sure you take your nexium twice a day Restart loratadine 10mg  daily Start fluticasone nasal spray, 2 sprays each nostril once a day.  We will perform spirometry today >> no clear evidence for asthma Keep albuterol available to use 2 puffs up to every 4 hours if needed for shortness of breath.  Follow with Dr Lamonte Sakai as needed for any changes in your breathing.

## 2017-01-05 NOTE — Progress Notes (Signed)
Subjective:    Patient ID: Bonnie Hall, female    DOB: 06-Jun-1961, 56 y.o.   MRN: [id number]  HPI Pleasant 56 year old woman, never smoker, with a history of allergic rhinitis, hypertension, migraine headaches, depression, GERD. She was once told that she might have a tendency towards exercise induced asthma in her 20's. She is 6.5 months pregnant. She has developed some dyspnea about 2 months ago, difficult to walk across the room. Hard to get a deep breath in. Her allergies and mucous seem to be worse. She has a nagging persistent cough that started w a URI 12/17, has hung on. She is having a lot breakthrough GERD. She started loratadine without much change in mucous.    Echocardiogram 12/24/16 was reviewed by me. This shows normal LV size and function with some mild concentric hypertrophy, grade 1 diastolic dysfunction, normal RV size and function, normal estimated pulmonary arterial pressure.                                          Review of Systems  Constitutional: Negative for fever and unexpected weight change.  HENT: Negative for congestion, dental problem, ear pain, nosebleeds, postnasal drip, rhinorrhea, sinus pressure, sneezing, sore throat and trouble swallowing.   Eyes: Negative for redness and itching.  Respiratory: Positive for cough and shortness of breath. Negative for chest tightness and wheezing.   Cardiovascular: Negative for palpitations and leg swelling.  Gastrointestinal: Negative for nausea and vomiting.  Genitourinary: Negative for dysuria.  Musculoskeletal: Negative for joint swelling.  Skin: Negative for rash.  Neurological: Negative for headaches.  Hematological: Does not bruise/bleed easily.  Psychiatric/Behavioral: Negative for dysphoric mood. The patient is nervous/anxious.    Past Medical History:  Diagnosis Date  . Allergy    Hay fever/exercise induced asthma  . Anxiety   . Blood transfusion 1990's  . Depression    bipolar  . GERD  (gastroesophageal reflux disease)   . Hyperlipidemia   . Hypertension   . Iron deficiency anemia 1990's   negative work up as to etiology; required iron infusion, blood transfusion.  Saw an hematologist  at [facility].   . Migraine   . Preterm labor   . Sleep apnea   . Thrombocytosis ([facility])      Family History  Problem Relation Age of Onset  . Colon cancer Paternal Uncle 40  . Colon cancer Paternal Uncle 19  . Prostate cancer Mother 59  . Breast cancer Paternal Aunt      Social History   Social History  . Marital status: Married    Spouse name: N/A  . Number of children: 2  . Years of education: N/A   Occupational History  .      house wife   Social History Main Topics  . Smoking status: Never Smoker  . Smokeless tobacco: Never Used  . Alcohol use No  . Drug use: No  . Sexual activity: Not Currently   Other Topics Concern  . Not on file   Social History Narrative  . No narrative on file     No Known Allergies   Outpatient Medications Prior to Visit  Medication Sig Dispense Refill  . albuterol (VENTOLIN HFA) 108 (90 Base) MCG/ACT inhaler Inhale 2 puffs into the lungs every 4 (four) hours as needed for wheezing or shortness of breath. 1 Inhaler 0  . ALPRAZolam (XANAX) 0.5 MG  tablet Take 0.5 mg by mouth as needed.    Marland Kitchen aspirin EC 81 MG tablet Take 1 tablet (81 mg total) by mouth daily.    . butalbital-acetaminophen-caffeine (FIORICET, ESGIC) 50-325-40 MG tablet Take one tablet every 12 hours as needed for pain 100 tablet 0  . esomeprazole (NEXIUM) 40 MG capsule Take 1 capsule (40 mg total) by mouth 2 (two) times daily. 267 capsule 3  . folic acid (FOLVITE) 124 MCG tablet Take 400 mcg by mouth daily.    Marland Kitchen lamoTRIgine (LAMICTAL) 150 MG tablet Take 150 mg by mouth daily.    Marland Kitchen loratadine (CLARITIN) 10 MG tablet Take 1 tablet (10 mg total) by mouth daily. 90 tablet 6  . Lurasidone HCl (LATUDA PO) Take 10 mg by mouth daily.     . Melatonin 3 MG CAPS Take 2 capsules by mouth  at bedtime.     . montelukast (SINGULAIR) 10 MG tablet Take 1 tablet (10 mg total) by mouth at bedtime. 90 tablet 3  . Prenatal Vit-Fe Fumarate-FA (PRENATAL MULTIVITAMIN) TABS tablet Take 1 tablet by mouth daily at 12 noon.    . vortioxetine HBr (TRINTELLIX) 20 MG TABS Take by mouth daily.    Marland Kitchen estradiol-norethindrone (MIMVEY) 1-0.5 MG tablet Take 1 tablet by mouth daily. (Patient not taking: Reported on 01/05/2017) 84 tablet 1   No facility-administered medications prior to visit.        Objective:   Physical Exam Vitals:   01/05/17 0929  BP: 112/80  Pulse: 84  SpO2: 96%  Weight: 194 lb (88 kg)  Height: 5' 5.5" (1.664 m)   Gen: Pleasant, well-nourished, in no distress,  normal affect  ENT: No lesions,  mouth clear,  oropharynx clear, no postnasal drip  Neck: No JVD, no TMG, no carotid bruits  Lungs: No use of accessory muscles, no dullness to percussion, clear without rales or rhonchi  Cardiovascular: RRR, heart sounds normal, no murmur or gallops, no peripheral edema  Abdomen: soft and NT, no HSM,  BS normal  Musculoskeletal: No deformities, no cyanosis or clubbing  Neuro: alert, non focal  Skin: Warm, no lesions or rashes      Assessment & Plan:  Dyspnea Her exertional dyspnea has corresponded with her pregnancy. She also has some cough, secretions, mucous production all of which could be consistent with pregnancy related asthma. Spirometry today is reassuring, no evidence of airflow obstruction. I suspect that her dyspnea relates to restriction from the pregnancy and will improve after delivery. In the meantime we will continue to aggressively control GERD, allergic rhinitis. She can keep albuterol to use as needed but I do not believe she needs scheduled bronchodilator therapy based on this evaluation.  Make sure you take your nexium twice a day Restart loratadine 10mg  daily Start fluticasone nasal spray, 2 sprays each nostril once a day.  We will perform spirometry  today >> no clear evidence for asthma Keep albuterol available to use 2 puffs up to every 4 hours if needed for shortness of breath.  Follow with Dr Lamonte Sakai as needed for any changes in your breathing.    Baltazar Apo, MD, PhD 01/05/2017, 12:08 PM [facility] [phone] or if no answer [phone]

## 2017-01-05 NOTE — Telephone Encounter (Signed)
Rx sent  Left her a detailed msg letting her know

## 2017-01-05 NOTE — Assessment & Plan Note (Signed)
Her exertional dyspnea has corresponded with her pregnancy. She also has some cough, secretions, mucous production all of which could be consistent with pregnancy related asthma. Spirometry today is reassuring, no evidence of airflow obstruction. I suspect that her dyspnea relates to restriction from the pregnancy and will improve after delivery. In the meantime we will continue to aggressively control GERD, allergic rhinitis. She can keep albuterol to use as needed but I do not believe she needs scheduled bronchodilator therapy based on this evaluation.  Make sure you take your nexium twice a day Restart loratadine 10mg  daily Start fluticasone nasal spray, 2 sprays each nostril once a day.  We will perform spirometry today >> no clear evidence for asthma Keep albuterol available to use 2 puffs up to every 4 hours if needed for shortness of breath.  Follow with Dr Lamonte Sakai as needed for any changes in your breathing.

## 2017-02-03 ENCOUNTER — Ambulatory Visit (INDEPENDENT_AMBULATORY_CARE_PROVIDER_SITE_OTHER): Admitting: Internal Medicine

## 2017-02-03 ENCOUNTER — Encounter: Payer: Self-pay | Admitting: Internal Medicine

## 2017-02-03 ENCOUNTER — Ambulatory Visit (INDEPENDENT_AMBULATORY_CARE_PROVIDER_SITE_OTHER)
Admission: RE | Admit: 2017-02-03 | Discharge: 2017-02-03 | Disposition: A | Discharge status: Home / Self Care | Source: Ambulatory Visit | Attending: Internal Medicine | Admitting: Internal Medicine

## 2017-02-03 VITALS — BP 106/74 | HR 96 | Ht 66.5 in | Wt 211.0 lb

## 2017-02-03 DIAGNOSIS — R05 Cough: Secondary | ICD-10-CM

## 2017-02-03 DIAGNOSIS — R058 Other specified cough: Secondary | ICD-10-CM

## 2017-02-03 MED ORDER — ACETAMINOPHEN-CODEINE #3 300-30 MG PO TABS: ORAL_TABLET | ORAL | 0 refills | Status: DC

## 2017-02-03 NOTE — Patient Instructions (Addendum)
For cough >> Mucinex dm 1200 mg every 12hours supplement with tylenol #3 up to 1-2 every 4 hours as needed with the goal of eliminating all coughing x 3 straight days before taper   Nexium should be Take 30- 60 min before your first and last meals of the day   GERD (REFLUX)  is an extremely common cause of respiratory symptoms just like yours , many times with no obvious heartburn at all.    It can be treated with medication, but also with lifestyle changes including elevation of the head of your bed (ideally with 6 inch  bed blocks),  Smoking cessation, avoidance of late meals, excessive alcohol, and avoid fatty foods, chocolate, peppermint, colas, red wine, and acidic juices such as orange juice.  NO MINT OR MENTHOL PRODUCTS SO NO COUGH DROPS   USE SUGARLESS CANDY INSTEAD (Jolley ranchers or Stover's or Life Savers) or even ice chips will also do - the key is to swallow to prevent all throat clearing. NO OIL BASED VITAMINS - use powdered substitutes.    Please remember to go to the  x-ray department downstairs in the basement  for your tests - we will call you with the results when they are available.

## 2017-02-03 NOTE — Progress Notes (Signed)
Subjective:    Patient ID: Bonnie Hall, female    DOB: 12/01/1960 .   MRN: [id number]    Ov/ Dr Lamonte Sakai  01/05/17  Pleasant 56 year old woman, never smoker, with a history of allergic rhinitis, hypertension, migraine headaches, depression, GERD. She was once told that she might have a tendency towards exercise induced asthma in her 20's. at 4.5 months pregnant. She  developed some dyspnea  , difficult to walk across the room. Hard to get a deep breath in. Her allergies and mucous seem to be worse. She has a nagging persistent cough that started w a URI 12/17, has hung on. She is having a lot breakthrough GERD. She started loratadine without much change in mucous.    Echocardiogram 12/24/16  This shows normal LV size and function with some mild concentric hypertrophy, grade 1 diastolic dysfunction, normal RV size and function, normal estimated pulmonary arterial pressure.        rec Make sure you take your nexium twice a day Restart loratadine 10mg  daily Start fluticasone nasal spray, 2 sprays each nostril once a day.   spirometry > no clear evidence for asthma Keep albuterol available to use 2 puffs up to every 4 hours if needed for shortness of breath.     02/03/2017  Acute extended  ov/Bonnie Hall re:  Cough x dec 2017  Now 7.5 m gestation  Chief Complaint  Patient presents with  . Acute Visit    Cough not improving and now she is c/o hoarseness.  She also c/o still having SOB.  Sputum was blood tinged yesterday.   cough since xmas 2017 on nexium bid but not timing ac  Prednisone didn't help/ singulair has not helped / saba not helping and no change sob which only started well after fits of coughing and much worse to point of gagging/ urinary incont 24/7 pattern Some assoc nasal congestion/ epistaxis, only scant hemoptysis during severe cough  mucinex works the best     No obvious day to day or daytime variability or assoc excess/ purulent sputum or mucus plugs or cp or chest  tightness, subjective wheeze or overt sinus or hb symptoms. No unusual exp hx or h/o childhood pna/ asthma or knowledge of premature birth.  Sleeping ok without nocturnal  or early am exacerbation  of respiratory  c/o's or need for noct saba. Also denies any obvious fluctuation of symptoms with weather or environmental changes or other aggravating or alleviating factors except as outlined above   Current Medications, Allergies, Complete Past Medical History, Past Surgical History, Family History, and Social History were reviewed in [employer] record.  ROS  The following are not active complaints unless bolded sore throat, dysphagia, dental problems, itching, sneezing,  nasal congestion or excess/ purulent secretions, ear ache,   fever, chills, sweats, unintended wt loss, classically pleuritic or exertional cp,  orthopnea pnd or leg swelling, presyncope, palpitations, abdominal pain, anorexia, nausea, vomiting, diarrhea  or change in bowel or bladder habits, change in stools or urine, dysuria,hematuria,  rash, arthralgias, visual complaints, headache, numbness, weakness or ataxia or problems with walking or coordination,  change in mood/affect or memory.                                                 Objective:   Physical Exam    amb pleasant  wf nad extremely harsh barking quality cough  . Wt Readings from Last 3 Encounters:  02/03/17 211 lb (95.7 kg)  01/05/17 194 lb (88 kg)  12/07/16 191 lb (86.6 kg)    Vital signs reviewed - Note on arrival 02 sats  95% on RA      HEENT: nl dentition, turbinates bilaterally, and oropharynx. Nl external ear canals without cough reflex   NECK :  without JVD/Nodes/TM/ nl carotid upstrokes bilaterally   LUNGS: no acc muscle use,  Nl contour chest with very minimal insp / exp rhonchi mostly upper airway    CV:  RRR  no s3 or murmur or increase in P2, and  1+ pitting sym lower edema   ABD:  soft and nontender with nl  inspiratory excursion in the supine position. No bruits or organomegaly appreciated, bowel sounds nl  MS:  Nl gait/ ext warm without deformities, calf tenderness, cyanosis or clubbing No obvious joint restrictions   SKIN: warm and dry without lesions    NEURO:  alert, approp, nl sensorium with  no motor or cerebellar deficits apparent.    CXR PA and Lateral:   02/03/2017 :    I personally reviewed images and agree with radiology impression as follows:    no acute abnormalities       Assessment & Plan:

## 2017-02-03 NOTE — Assessment & Plan Note (Signed)
Most likley this is Upper airway cough syndrome (previously labeled PNDS) , is  so named because it's frequently impossible to sort out how much is  CR/sinusitis with freq throat clearing (which can be related to primary GERD)   vs  causing  secondary (" extra esophageal")  GERD from wide swings in gastric pressure that occur with throat clearing, often  promoting self use of mint and menthol lozenges that reduce the lower esophageal sphincter tone and exacerbate the problem further in a cyclical fashion.   These are the same pts (now being labeled as having "irritable larynx syndrome" by some cough centers) who not infrequently have a history of having failed to tolerate ace inhibitors,  dry powder inhalers or biphosphonates or report having atypical/extraesophageal reflux symptoms that don't respond to standard doses of PPI  and are easily confused as having aecopd or asthma flares by even experienced allergists/ pulmonologists (myself included).  Of the three most common causes of  Sub-acute or recurrent or chronic cough, only one (GERD)  can actually contribute to/ trigger  the other two (asthma and post nasal drip syndrome)  and perpetuate the cylce of cough.  While not intuitively obvious, many patients with chronic low grade reflux do not cough until there is a primary insult that disturbs the protective epithelial barrier and exposes sensitive nerve endings.   This is typically viral but can be direct physical injury such as with an endotracheal tube.   The point is that once this occurs, it is difficult to eliminate the cycle  using anything but a maximally effective acid suppression regimen at least in the short run, accompanied by an appropriate diet to address non acid GERD and control / eliminate the cough itself for at least 3 days > try tylenol #3 up to 2 every 4 h and nexium bid ac/ diet reviewed  Discussed in detail all the  indications, usual  risks and alternatives  relative to the  benefits with patient who agrees to proceed with rx as outlined with the severity of the coughings fits with assc massive swings in intra-abdominal pressure much risky for her pregancy than meds or images rec    I had an extended discussion with the patient reviewing all relevant studies completed to date and  lasting 25 minutes of a 40  minute acute visit with pt not previously known to me re  severe non-specific but potentially very serious refractory respiratory symptoms of uncertain and potentially multiple  etiologies.   Each maintenance medication was reviewed in detail including most importantly the difference between maintenance and prns and under what circumstances the prns are to be triggered using an action plan format that is not reflected in the computer generated alphabetically organized AVS.    Please see AVS for specific instructions unique to this office visit that I personally wrote and verbalized to the the pt in detail and then reviewed with pt  by my nurse highlighting any changes in therapy/plan of care  recommended at today's visit.

## 2017-02-04 NOTE — Progress Notes (Signed)
Spoke with pt and notified of results per Dr. Wert. Pt verbalized understanding and denied any questions. 

## 2017-02-10 ENCOUNTER — Encounter (HOSPITAL_COMMUNITY): Payer: Self-pay

## 2017-02-10 ENCOUNTER — Inpatient Hospital Stay (HOSPITAL_COMMUNITY)
Admission: AD | Admit: 2017-02-10 | Discharge: 2017-02-10 | Disposition: A | Discharge status: Home / Self Care | Source: Ambulatory Visit | Attending: Obstetrics and Gynecology | Admitting: Obstetrics and Gynecology

## 2017-02-10 DIAGNOSIS — E785 Hyperlipidemia, unspecified: Secondary | ICD-10-CM | POA: Insufficient documentation

## 2017-02-10 DIAGNOSIS — Z3A33 33 weeks gestation of pregnancy: Secondary | ICD-10-CM | POA: Diagnosis not present

## 2017-02-10 DIAGNOSIS — O09813 Supervision of pregnancy resulting from assisted reproductive technology, third trimester: Secondary | ICD-10-CM | POA: Diagnosis not present

## 2017-02-10 DIAGNOSIS — O30003 Twin pregnancy, unspecified number of placenta and unspecified number of amniotic sacs, third trimester: Secondary | ICD-10-CM | POA: Insufficient documentation

## 2017-02-10 DIAGNOSIS — O99343 Other mental disorders complicating pregnancy, third trimester: Secondary | ICD-10-CM | POA: Diagnosis not present

## 2017-02-10 DIAGNOSIS — K219 Gastro-esophageal reflux disease without esophagitis: Secondary | ICD-10-CM | POA: Diagnosis not present

## 2017-02-10 DIAGNOSIS — F419 Anxiety disorder, unspecified: Secondary | ICD-10-CM | POA: Insufficient documentation

## 2017-02-10 DIAGNOSIS — O09523 Supervision of elderly multigravida, third trimester: Secondary | ICD-10-CM | POA: Insufficient documentation

## 2017-02-10 DIAGNOSIS — F319 Bipolar disorder, unspecified: Secondary | ICD-10-CM | POA: Diagnosis not present

## 2017-02-10 DIAGNOSIS — O99283 Endocrine, nutritional and metabolic diseases complicating pregnancy, third trimester: Secondary | ICD-10-CM | POA: Diagnosis not present

## 2017-02-10 DIAGNOSIS — O99613 Diseases of the digestive system complicating pregnancy, third trimester: Secondary | ICD-10-CM | POA: Insufficient documentation

## 2017-02-10 DIAGNOSIS — Z79899 Other long term (current) drug therapy: Secondary | ICD-10-CM | POA: Diagnosis not present

## 2017-02-10 DIAGNOSIS — I10 Essential (primary) hypertension: Secondary | ICD-10-CM | POA: Diagnosis present

## 2017-02-10 DIAGNOSIS — O133 Gestational [pregnancy-induced] hypertension without significant proteinuria, third trimester: Secondary | ICD-10-CM | POA: Insufficient documentation

## 2017-02-10 LAB — COMPREHENSIVE METABOLIC PANEL
ALT: 16 U/L (ref 14–54)
AST: 23 U/L (ref 15–41)
Albumin: 2.6 g/dL — ABNORMAL LOW (ref 3.5–5.0)
Alkaline Phosphatase: 88 U/L (ref 38–126)
Anion gap: 7 (ref 5–15)
BUN: 17 mg/dL (ref 6–20)
CO2: 22 mmol/L (ref 22–32)
Calcium: 8.4 mg/dL — ABNORMAL LOW (ref 8.9–10.3)
Chloride: 106 mmol/L (ref 101–111)
Creatinine, Ser: 0.61 mg/dL (ref 0.44–1.00)
GFR calc Af Amer: >60 mL/min (ref >60)
GFR calc non Af Amer: >60 mL/min (ref >60)
Glucose, Bld: 96 mg/dL (ref 65–99)
Potassium: 4.1 mmol/L (ref 3.5–5.1)
Sodium: 135 mmol/L (ref 135–145)
Total Bilirubin: 0.5 mg/dL (ref 0.3–1.2)
Total Protein: 6.1 g/dL — ABNORMAL LOW (ref 6.5–8.1)

## 2017-02-10 LAB — URINALYSIS, ROUTINE W REFLEX MICROSCOPIC
Bacteria, UA: NONE SEEN
Bilirubin Urine: NEGATIVE
Glucose, UA: NEGATIVE mg/dL
Hgb urine dipstick: NEGATIVE
Ketones, ur: NEGATIVE mg/dL
Leukocytes, UA: NEGATIVE
Nitrite: NEGATIVE
Protein, ur: 30 mg/dL — AB
Specific Gravity, Urine: 1.029 (ref 1.005–1.030)
pH: 5 (ref 5.0–8.0)

## 2017-02-10 LAB — CBC
HCT: 37.8 % (ref 36.0–46.0)
Hemoglobin: 12.5 g/dL (ref 12.0–15.0)
MCH: 31.6 pg (ref 26.0–34.0)
MCHC: 33.1 g/dL (ref 30.0–36.0)
MCV: 95.5 fL (ref 78.0–100.0)
Platelets: 368 10*3/uL (ref 150–400)
RBC: 3.96 MIL/uL (ref 3.87–5.11)
RDW: 14.2 % (ref 11.5–15.5)
WBC: 10.9 10*3/uL — ABNORMAL HIGH (ref 4.0–10.5)

## 2017-02-10 LAB — PROTEIN / CREATININE RATIO, URINE
Creatinine, Urine: 215 mg/dL
Protein Creatinine Ratio: 0.13 mg/mg{Cre} (ref 0.00–0.15)
Total Protein, Urine: 28 mg/dL

## 2017-02-10 MED ORDER — LABETALOL HCL 100 MG PO TABS: 100.0000 mg | ORAL_TABLET | Freq: Two times a day (BID) | ORAL | 2 refills | Status: DC

## 2017-02-10 NOTE — MAU Note (Signed)
Urine in lab 

## 2017-02-10 NOTE — Progress Notes (Addendum)
G2P1L2 Here in triage for High BP. Denies LOF or bleeding. States has vaginal itching for "on and off for two weeks".   EFM applied. See flow sheet for VS. Denies symptoms of PIH other than ha for past few days. Edema noted on lower extremities but states had them for few wks and provider aware.   1645: Provider at bs.  1650: crackers and drink provided per provider.   1803: Up to bathrrom.   1807: Monitors applied. Informed pt pending labs and as soon as lab results up would let her know status.   1907: lab notified enquiring about the labs. States still pending.  1915: :  Informed pt waiting on one more lab results.   1947: lab notified again of protein creatine ratio as still pending.  1951: Put on hold on phone for result. States 20 more minutes before PCR reults.   1954: informed pt will be another 15 minutes before results of PCR. Monitors off. MD made aware of the baseline changes. No new orders.   2015: MD at bs assessing pt and discussing POC.  2021: MD made aware of the PCR 0.13  2029: New orders for labetalol 100 mg x2 per day and start  24 hr urine collection to be started at home,   2045: explained to pt to return at 0800 tomorrow for NST, serial BP, and repeat [place] labs.  pt instructed to bring the 24 hr urine collection when come in the morning so nurse can put on ice and cont with 24 hr urine collection. Pt verbalizes understanding.  2050: pt started 24 hr urine.   2100: Discharge instructions given with pt understanding. Pt left unit via ambulatory. Knows to come back to triage tomorrow at 0800.

## 2017-02-10 NOTE — Progress Notes (Signed)
56 yo G2P1 (twins) @ 33 2/7 weeks , IVF pregnancy. BP yesterday in our office were normal. Today in pschiatrist's office BPs were 160-170s/90-100s. Patient denies HA, vision change, epigastric pain. History     CSN: [id number]  Arrival date and time: 02/10/17 1606   First Provider Initiated Contact with Patient 02/10/17 1643      Chief Complaint  Patient presents with  . Hypertension   HPI  Pertinent Gynecological History: Menses: N/A Bleeding: N/A Contraception: N/A DES exposure: denies Blood transfusions: none Sexually transmitted diseases: no past history Previous GYN Procedures: N/A  Last mammogram: unknown Date: N/A Last pap: unknown Date: N/A   Past Medical History:  Diagnosis Date  . Allergy    Hay fever/exercise induced asthma  . Anxiety   . Blood transfusion 1990's  . Depression    bipolar  . GERD (gastroesophageal reflux disease)   . Hyperlipidemia   . Hypertension   . Iron deficiency anemia 1990's   negative work up as to etiology; required iron infusion, blood transfusion.  Saw an hematologist  at [facility].   . Migraine   . Preterm labor   . Sleep apnea   . Thrombocytosis ([place])     Past Surgical History:  Procedure Laterality Date  . CESAREAN SECTION  2009  . COLONOSCOPY  06/2012   neg. with [place]  . NASAL SEPTUM SURGERY  1983  . TONSILLECTOMY  1966    Family History  Problem Relation Age of Onset  . Colon cancer Paternal Uncle 30  . Colon cancer Paternal Uncle 27  . Prostate cancer Mother 67  . Breast cancer Paternal Aunt     Social History  Substance Use Topics  . Smoking status: Never Smoker  . Smokeless tobacco: Never Used  . Alcohol use No    Allergies: No Known Allergies  Prescriptions Prior to Admission  Medication Sig Dispense Refill Last Dose  . acetaminophen-codeine (TYLENOL #3) 300-30 MG tablet One-two every 4 hours as needed for cough 40 tablet 0 02/10/2017 at Unknown time  . albuterol (VENTOLIN HFA) 108 (90 Base) MCG/ACT  inhaler Inhale 2 puffs into the lungs every 4 (four) hours as needed for wheezing or shortness of breath. 1 Inhaler 0 Past Month at Unknown time  . ALPRAZolam (XANAX) 0.5 MG tablet Take 0.5 mg by mouth as needed.   Past Week at Unknown time  . aspirin EC 81 MG tablet Take 1 tablet (81 mg total) by mouth daily.   02/09/2017 at Unknown time  . butalbital-acetaminophen-caffeine (FIORICET, ESGIC) 50-325-40 MG tablet Take one tablet every 12 hours as needed for pain 100 tablet 0 02/09/2017 at Unknown time  . chlorpheniramine-HYDROcodone (TUSSIONEX) 10-8 MG/5ML SUER Take 5 mLs by mouth every 12 (twelve) hours as needed for cough.   02/09/2017 at Unknown time  . fluticasone (FLONASE) 50 MCG/ACT nasal spray Place 2 sprays into both nostrils daily. 16 g 2 Past Month at Unknown time  . folic acid (FOLVITE) 614 MCG tablet Take 800 mcg by mouth daily.    02/09/2017 at Unknown time  . guaiFENesin (MUCUS-ER) 600 MG 12 hr tablet Take 600 mg by mouth 2 (two) times daily as needed for cough or to loosen phlegm.    Past Week at Unknown time  . lamoTRIgine (LAMICTAL) 150 MG tablet Take 150 mg by mouth daily.   02/09/2017 at Unknown time  . loratadine (CLARITIN) 10 MG tablet Take 1 tablet (10 mg total) by mouth daily. 90 tablet 6 Past Month at Unknown  time  . lurasidone (LATUDA) 20 MG TABS tablet Take 10 mg by mouth daily.   02/09/2017 at Unknown time  . Melatonin 3 MG CAPS Take 2 capsules by mouth at bedtime.    02/09/2017 at Unknown time  . montelukast (SINGULAIR) 10 MG tablet Take 1 tablet (10 mg total) by mouth at bedtime. 90 tablet 3 02/09/2017 at Unknown time  . pantoprazole (PROTONIX) 40 MG tablet Take 40 mg by mouth 2 (two) times daily.   02/10/2017 at Unknown time  . predniSONE (DELTASONE) 10 MG tablet Take 10 mg by mouth daily as needed (cough).    02/10/2017 at Unknown time  . Prenatal Vit-Fe Fumarate-FA (PRENATAL MULTIVITAMIN) TABS tablet Take 1 tablet by mouth daily at 12 noon.   02/09/2017 at Unknown time  .  PRESCRIPTION MEDICATION Take 10 mLs by mouth. Swich and swallow every 6 hours  [facility] magic mouthwash   02/10/2017 at Unknown time  . vortioxetine HBr (TRINTELLIX) 20 MG TABS Take 20 mg by mouth daily.    02/09/2017 at Unknown time  . esomeprazole (NEXIUM) 40 MG capsule Take 1 capsule (40 mg total) by mouth 2 (two) times daily. (Patient not taking: Reported on 02/10/2017) 180 capsule 3 Not Taking at Unknown time  . estradiol-norethindrone (MIMVEY) 1-0.5 MG tablet Take 1 tablet by mouth daily. (Patient not taking: Reported on 02/10/2017) 84 tablet 1 Not Taking at Unknown time    Review of Systems Physical Exam   Blood pressure (!) 148/88, pulse 78, temperature 98.7 F (37.1 C), temperature source Oral, resp. rate 18, height 5\' 6"  (1.676 m), weight 202 lb (91.6 kg), SpO2 94 %.  Physical Exam  MAU Course  Procedures  MDM Mild gestational hypertension   Assessment and Plan  Gestational hypertension Carefully D/W patient how rapidly medical conditions can change in pregnancy. Also D/W her age in pregnancy and the risks of unpredictable and possibly life threatening complications for her and/or baby. I strongly encouraged her to stay overnight for observation. She states she understands but has a child at home with a forearm fracture. States her husband is unable to care for child adequately and this could lead to surgery for the child. I asked her to stay and she stated she very much wants to go home. We reviewed symptoms of preeclampsia. Will begin labetalol 100mg  po bid and start 24 hour urine collection. She will return to MAU tomorrow at 8 am for repeat labs, BP and NST. I told her I cannot guarantee she will be OK between now and then. She states she understands.  Talecia Sherlin II,Azalya Galyon E 02/10/2017, 8:34 PM

## 2017-02-10 NOTE — MAU Provider Note (Signed)
History     CSN: [id number]  Arrival date and time: 02/10/17 1606   First Provider Initiated Contact with Patient 02/10/17 1643      Chief Complaint  Patient presents with  . Hypertension   Bonnie Hall is a 56 y.o. G2P0102 at [redacted]w[redacted]d had elevated blood pressure at psychiatrist's office today so called her OB/GYN providers who sent her here for evaluation.  She had a slight headache earlier but no headache at present. Denies scotomata. No contractions or abdominal pain. No vaginal bleeding or leakage of fluid. Pregnancy significant for advanced maternal and paternal age, IVF pregnancy, previous c-section, bipolar disorder, small fibroid, chronic thrombocytosis. Had elevated 1 hour Glucola screen and subsequent normal 3 hour OGTT. States fetal growth accelerated at 94th percentile 2 weeks ago.     OB History  Gravida Para Term Preterm AB Living  2 1   1   2   SAB TAB Ectopic Multiple Live Births        1 2    # Outcome Date GA Lbr Len/2nd Weight Sex Delivery Anes PTL Lv  2 Current           1A Preterm 08/20/08 [redacted]w[redacted]d   F CS-LTranv   LIV  1B Preterm 08/20/08 [redacted]w[redacted]d   M CS-LTranv   LIV       Past Medical History:  Diagnosis Date  . Allergy    Hay fever/exercise induced asthma  . Anxiety   . Blood transfusion 1990's  . Depression    bipolar  . GERD (gastroesophageal reflux disease)   . Hyperlipidemia   . Hypertension   . Iron deficiency anemia 1990's   negative work up as to etiology; required iron infusion, blood transfusion.  Saw an hematologist  at [facility].   . Migraine   . Preterm labor   . Sleep apnea   . Thrombocytosis ([place])     Past Surgical History:  Procedure Laterality Date  . CESAREAN SECTION  2009  . COLONOSCOPY  06/2012   neg. with [place]  . NASAL SEPTUM SURGERY  1983  . TONSILLECTOMY  1966    Family History  Problem Relation Age of Onset  . Colon cancer Paternal Uncle 56  . Colon cancer Paternal Uncle 27  . Prostate cancer Mother 69  .  Breast cancer Paternal Aunt     Social History  Substance Use Topics  . Smoking status: Never Smoker  . Smokeless tobacco: Never Used  . Alcohol use No    Allergies: No Known Allergies  Prescriptions Prior to Admission  Medication Sig Dispense Refill Last Dose  . acetaminophen-codeine (TYLENOL #3) 300-30 MG tablet One-two every 4 hours as needed for cough 40 tablet 0   . albuterol (VENTOLIN HFA) 108 (90 Base) MCG/ACT inhaler Inhale 2 puffs into the lungs every 4 (four) hours as needed for wheezing or shortness of breath. 1 Inhaler 0 Taking  . ALPRAZolam (XANAX) 0.5 MG tablet Take 0.5 mg by mouth as needed.   Taking  . aspirin EC 81 MG tablet Take 1 tablet (81 mg total) by mouth daily.   Taking  . butalbital-acetaminophen-caffeine (FIORICET, ESGIC) 50-325-40 MG tablet Take one tablet every 12 hours as needed for pain 100 tablet 0 Taking  . esomeprazole (NEXIUM) 40 MG capsule Take 1 capsule (40 mg total) by mouth 2 (two) times daily. 180 capsule 3 Taking  . estradiol-norethindrone (MIMVEY) 1-0.5 MG tablet Take 1 tablet by mouth daily. 84 tablet 1 Taking  . fluticasone (FLONASE) 50  MCG/ACT nasal spray Place 2 sprays into both nostrils daily. 16 g 2 Taking  . folic acid (FOLVITE) 371 MCG tablet Take 400 mcg by mouth daily.   Taking  . guaiFENesin (MUCUS-ER) 600 MG 12 hr tablet Take 600 mg by mouth 2 (two) times daily as needed.   Taking  . lamoTRIgine (LAMICTAL) 150 MG tablet Take 150 mg by mouth daily.   Taking  . loratadine (CLARITIN) 10 MG tablet Take 1 tablet (10 mg total) by mouth daily. 90 tablet 6 Taking  . Lurasidone HCl (LATUDA PO) Take 10 mg by mouth daily.    Taking  . Melatonin 3 MG CAPS Take 2 capsules by mouth at bedtime.    Taking  . montelukast (SINGULAIR) 10 MG tablet Take 1 tablet (10 mg total) by mouth at bedtime. 90 tablet 3 Taking  . predniSONE (DELTASONE) 10 MG tablet Take 10 mg by mouth daily with breakfast.   Taking  . Prenatal Vit-Fe Fumarate-FA (PRENATAL  MULTIVITAMIN) TABS tablet Take 1 tablet by mouth daily at 12 noon.   Taking  . vortioxetine HBr (TRINTELLIX) 20 MG TABS Take by mouth daily.   Taking    Review of Systems  Constitutional: Negative for fatigue and fever.  HENT: Negative for congestion.   Respiratory: Negative for chest tightness.   Gastrointestinal: Negative for abdominal pain, constipation, diarrhea, nausea and vomiting.  Genitourinary: Positive for dysuria. Negative for frequency, pelvic pain, urgency, vaginal bleeding and vaginal discharge.   Physical Exam   Blood pressure (!) 155/85, pulse 77, temperature 98.4 F (36.9 C), temperature source Oral, resp. rate 18, SpO2 97 %.  Patient Vitals for the past 24 hrs:  BP Temp Temp src Pulse Resp SpO2 Height Weight  02/10/17 1930 (!) 147/68 - - 85 - - - -  02/10/17 1915 (!) 142/88 - - 94 - - - -  02/10/17 1900 (!) 147/77 - - 88 - - - -  02/10/17 1845 (!) 152/73 - - 85 - - - -  02/10/17 1830 140/73 - - 88 - - - -  02/10/17 1815 (!) 143/80 - - 94 - - - -  02/10/17 1800 (!) 150/94 - - 87 - 95 % 5\' 6"  (1.676 m) 91.6 kg (202 lb)  02/10/17 1745 (!) 152/78 - - 85 - - - -  02/10/17 1730 (!) 145/69 - - 88 - - - -  02/10/17 1715 (!) 148/85 - - 93 - - - -  02/10/17 1645 (!) 146/94 - - 81 - - - -  02/10/17 1641 - - - - - 97 % - -  02/10/17 1639 (!) 155/85 98.4 F (36.9 C) Oral 77 18 97 % - -  02/10/17 1636 - - - - - 97 % - -  02/10/17 1630 - - - - - 98 % - -  02/10/17 1628 (!) 159/97 - - 82 - - - -  02/10/17 1625 - - - - - 98 % - -   Physical Exam  Cardiovascular: Normal rate.   Respiratory: Effort normal.  GI: Soft. There is no tenderness.  Musculoskeletal: Normal range of motion. She exhibits edema.    MAU Course  Procedures Results for orders placed or performed during the hospital encounter of 02/10/17 (from the past 24 hour(s))  Protein / creatinine ratio, urine     Status: None   Collection Time: 02/10/17  4:17 PM  Result Value Ref Range   Creatinine, Urine  215.00 mg/dL   Total Protein, Urine  28 mg/dL   Protein Creatinine Ratio 0.13 0.00 - 0.15 mg/mg[Cre]  Urinalysis, Routine w reflex microscopic     Status: Abnormal   Collection Time: 02/10/17  4:17 PM  Result Value Ref Range   Color, Urine YELLOW YELLOW   APPearance HAZY (A) CLEAR   Specific Gravity, Urine 1.029 1.005 - 1.030   pH 5.0 5.0 - 8.0   Glucose, UA NEGATIVE NEGATIVE mg/dL   Hgb urine dipstick NEGATIVE NEGATIVE   Bilirubin Urine NEGATIVE NEGATIVE   Ketones, ur NEGATIVE NEGATIVE mg/dL   Protein, ur 30 (A) NEGATIVE mg/dL   Nitrite NEGATIVE NEGATIVE   Leukocytes, UA NEGATIVE NEGATIVE   RBC / HPF 0-5 0 - 5 RBC/hpf   WBC, UA 0-5 0 - 5 WBC/hpf   Bacteria, UA NONE SEEN NONE SEEN   Squamous Epithelial / LPF 6-30 (A) NONE SEEN   Mucous PRESENT   CBC     Status: Abnormal   Collection Time: 02/10/17  4:59 PM  Result Value Ref Range   WBC 10.9 (H) 4.0 - 10.5 K/uL   RBC 3.96 3.87 - 5.11 MIL/uL   Hemoglobin 12.5 12.0 - 15.0 g/dL   HCT 37.8 36.0 - 46.0 %   MCV 95.5 78.0 - 100.0 fL   MCH 31.6 26.0 - 34.0 pg   MCHC 33.1 30.0 - 36.0 g/dL   RDW 14.2 11.5 - 15.5 %   Platelets 368 150 - 400 K/uL  Comprehensive metabolic panel     Status: Abnormal   Collection Time: 02/10/17  4:59 PM  Result Value Ref Range   Sodium 135 135 - 145 mmol/L   Potassium 4.1 3.5 - 5.1 mmol/L   Chloride 106 101 - 111 mmol/L   CO2 22 22 - 32 mmol/L   Glucose, Bld 96 65 - 99 mg/dL   BUN 17 6 - 20 mg/dL   Creatinine, Ser 0.61 0.44 - 1.00 mg/dL   Calcium 8.4 (L) 8.9 - 10.3 mg/dL   Total Protein 6.1 (L) 6.5 - 8.1 g/dL   Albumin 2.6 (L) 3.5 - 5.0 g/dL   AST 23 15 - 41 U/L   ALT 16 14 - 54 U/L   Alkaline Phosphatase 88 38 - 126 U/L   Total Bilirubin 0.5 0.3 - 1.2 mg/dL   GFR calc non Af Amer >60 >60 mL/min   GFR calc Af Amer >60 >60 mL/min   Anion gap 7 5 - 15   MDM Consult Dr. Gaetano Net here to see patient; reviewed normal lab results and consistently elevated BPs  Assessment and Plan  56 yo G2P0102 at  [redacted]w[redacted]d Gestational HTN  Dr. Gaetano Net offered admission for observation and antihypertensive treatment, patient declines but will be seen in the office tomorrow morning  Kazandra Forstrom CNM 02/10/2017, 4:48 PM

## 2017-02-11 ENCOUNTER — Inpatient Hospital Stay (HOSPITAL_COMMUNITY)
Admission: AD | Admit: 2017-02-11 | Discharge: 2017-02-11 | Disposition: A | Discharge status: Home / Self Care | Source: Ambulatory Visit | Attending: Obstetrics and Gynecology | Admitting: Obstetrics and Gynecology

## 2017-02-11 ENCOUNTER — Encounter (HOSPITAL_COMMUNITY): Payer: Self-pay | Admitting: *Deleted

## 2017-02-11 DIAGNOSIS — Z3A33 33 weeks gestation of pregnancy: Secondary | ICD-10-CM | POA: Insufficient documentation

## 2017-02-11 DIAGNOSIS — O133 Gestational [pregnancy-induced] hypertension without significant proteinuria, third trimester: Secondary | ICD-10-CM | POA: Insufficient documentation

## 2017-02-11 DIAGNOSIS — I10 Essential (primary) hypertension: Secondary | ICD-10-CM | POA: Diagnosis present

## 2017-02-11 DIAGNOSIS — O09523 Supervision of elderly multigravida, third trimester: Secondary | ICD-10-CM | POA: Diagnosis not present

## 2017-02-11 LAB — COMPREHENSIVE METABOLIC PANEL
ALT: 16 U/L (ref 14–54)
AST: 22 U/L (ref 15–41)
Albumin: 2.6 g/dL — ABNORMAL LOW (ref 3.5–5.0)
Alkaline Phosphatase: 132 U/L — ABNORMAL HIGH (ref 38–126)
Anion gap: 9 (ref 5–15)
BUN: 20 mg/dL (ref 6–20)
CO2: 23 mmol/L (ref 22–32)
Calcium: 8.5 mg/dL — ABNORMAL LOW (ref 8.9–10.3)
Chloride: 103 mmol/L (ref 101–111)
Creatinine, Ser: 0.72 mg/dL (ref 0.44–1.00)
GFR calc Af Amer: >60 mL/min (ref >60)
GFR calc non Af Amer: >60 mL/min (ref >60)
Glucose, Bld: 114 mg/dL — ABNORMAL HIGH (ref 65–99)
Potassium: 4.2 mmol/L (ref 3.5–5.1)
Sodium: 135 mmol/L (ref 135–145)
Total Bilirubin: 0.2 mg/dL — ABNORMAL LOW (ref 0.3–1.2)
Total Protein: 6 g/dL — ABNORMAL LOW (ref 6.5–8.1)

## 2017-02-11 LAB — PROTEIN / CREATININE RATIO, URINE
Creatinine, Urine: 332 mg/dL
Protein Creatinine Ratio: 0.09 mg/mg{Cre} (ref 0.00–0.15)
Total Protein, Urine: 29 mg/dL

## 2017-02-11 LAB — CBC
HCT: 36.9 % (ref 36.0–46.0)
Hemoglobin: 12.2 g/dL (ref 12.0–15.0)
MCH: 31.7 pg (ref 26.0–34.0)
MCHC: 33.1 g/dL (ref 30.0–36.0)
MCV: 95.8 fL (ref 78.0–100.0)
Platelets: 334 10*3/uL (ref 150–400)
RBC: 3.85 MIL/uL — ABNORMAL LOW (ref 3.87–5.11)
RDW: 14.3 % (ref 11.5–15.5)
WBC: 10.6 10*3/uL — ABNORMAL HIGH (ref 4.0–10.5)

## 2017-02-11 NOTE — MAU Note (Addendum)
Was here last night, high BP. Started a 24 hr urine at 2030.  Was told to return today for further monitoring.  Denies HA, visual changes, epigastric pain or increased swelling.

## 2017-02-11 NOTE — MAU Provider Note (Signed)
Chief Complaint:  No chief complaint on file.   First Provider Initiated Contact with Patient 02/11/17 [phone]     HPI: Bonnie Hall is a 56 y.o. G2P0102 at 44w3dwho presents to maternity admissions reporting elevated blood pressure.  Was here yesterday but had to leave to care for child.  Denies headache or abdominal pain.  Here for workup. She reports good fetal movement, denies LOF, vaginal bleeding, vaginal itching/burning, urinary symptoms, h/a, dizziness, n/v, diarrhea, constipation or fever/chills.  She denies headache, visual changes or RUQ abdominal pain.  Hypertension  This is a recurrent problem. The current episode started in the past 7 days. The problem is unchanged. Associated symptoms include peripheral edema. Pertinent negatives include no anxiety, blurred vision, headaches, malaise/fatigue, orthopnea or shortness of breath. There are no associated agents to hypertension. There are no known risk factors for coronary artery disease. Treatments tried: Labetalol. The current treatment provides moderate improvement. There are no compliance problems.    RN Note: Was here last night, high BP. Started a 24 hr urine at 2030.  Was told to return today for further monitoring.  Denies HA, visual changes, epigastric pain or increased swelling  Note from yesterday by Dr Gaetano Net:  Carefully D/W patient how rapidly medical conditions can change in pregnancy. Also D/W her age in pregnancy and the risks of unpredictable and possibly life threatening complications for her and/or baby. I strongly encouraged her to stay overnight for observation. She states she understands but has a child at home with a forearm fracture. States her husband is unable to care for child adequately and this could lead to surgery for the child. I asked her to stay and she stated she very much wants to go home. We reviewed symptoms of preeclampsia. Will begin labetalol 100mg  po bid and start 24 hour urine collection. She  will return to MAU tomorrow at 8 am for repeat labs, BP and NST. I told her I cannot guarantee she will be OK between now and then. She states she understands.  TOMBLIN II,JAMES E 02/10/2017, 8:34 PM   Past Medical History: Past Medical History:  Diagnosis Date  . Allergy    Hay fever/exercise induced asthma  . Anxiety   . Blood transfusion 1990's  . Depression    bipolar  . GERD (gastroesophageal reflux disease)   . Hyperlipidemia   . Hypertension   . Iron deficiency anemia 1990's   negative work up as to etiology; required iron infusion, blood transfusion.  Saw an hematologist  at [facility].   . Migraine   . Preterm labor   . Sleep apnea   . Thrombocytosis ([place])     Past obstetric history: OB History  Gravida Para Term Preterm AB Living  2 1   1   2   SAB TAB Ectopic Multiple Live Births        1 2    # Outcome Date GA Lbr Len/2nd Weight Sex Delivery Anes PTL Lv  2 Current           1A Preterm 08/20/08 [redacted]w[redacted]d   F CS-LTranv   LIV  1B Preterm 08/20/08 [redacted]w[redacted]d   M CS-LTranv   LIV      Past Surgical History: Past Surgical History:  Procedure Laterality Date  . CESAREAN SECTION  2009  . COLONOSCOPY  06/2012   neg. with [place]  . NASAL SEPTUM SURGERY  1983  . TONSILLECTOMY  1966    Family History: Family History  Problem Relation Age of Onset  .  Colon cancer Paternal Uncle 22  . Colon cancer Paternal Uncle 61  . Prostate cancer Mother 74  . Breast cancer Paternal Aunt     Social History: Social History  Substance Use Topics  . Smoking status: Never Smoker  . Smokeless tobacco: Never Used  . Alcohol use No    Allergies: No Known Allergies  Meds:  No prescriptions prior to admission.    I have reviewed patient's Past Medical Hx, Surgical Hx, Family Hx, Social Hx, medications and allergies.   ROS:  Review of Systems  Constitutional: Negative for malaise/fatigue.  Eyes: Negative for blurred vision.  Respiratory: Negative for shortness of breath.    Cardiovascular: Negative for orthopnea.  Neurological: Negative for headaches.   Other systems negative  Physical Exam  Patient Vitals for the past 24 hrs:  BP Temp Pulse Resp SpO2 Weight  02/11/17 1109 120/67 - 66 16 - -  02/11/17 0930 - - 72 - - -  02/11/17 0915 127/68 - 79 - - -  02/11/17 0900 127/79 - 84 - - -  02/11/17 0845 (!) 135/57 - 77 - - -  02/11/17 0836 138/72 - 70 - - -  02/11/17 0827 117/65 98.5 F (36.9 C) 76 18 98 % 205 lb 12 oz (93.3 kg)   Constitutional: Well-developed, well-nourished female in no acute distress.  Cardiovascular: normal rate and rhythm Respiratory: normal effort, clear to auscultation bilaterally GI: Abd soft, non-tender, gravid appropriate for gestational age.   No rebound or guarding. MS: Extremities nontender, trace edema, normal ROM Neurologic: Alert and oriented x 4.  DTRs 2+/no clonus GU: Neg CVAT.  PELVIC EXAM: deferred    FHT:  Baseline 140 , moderate variability, accelerations present, no decelerations Contractions:  Irregular     Labs: Results for orders placed or performed during the hospital encounter of 02/11/17 (from the past 24 hour(s))  Protein / creatinine ratio, urine     Status: None   Collection Time: 02/11/17  8:40 AM  Result Value Ref Range   Creatinine, Urine 332.00 mg/dL   Total Protein, Urine 29 mg/dL   Protein Creatinine Ratio 0.09 0.00 - 0.15 mg/mg[Cre]  CBC     Status: Abnormal   Collection Time: 02/11/17  9:01 AM  Result Value Ref Range   WBC 10.6 (H) 4.0 - 10.5 K/uL   RBC 3.85 (L) 3.87 - 5.11 MIL/uL   Hemoglobin 12.2 12.0 - 15.0 g/dL   HCT 36.9 36.0 - 46.0 %   MCV 95.8 78.0 - 100.0 fL   MCH 31.7 26.0 - 34.0 pg   MCHC 33.1 30.0 - 36.0 g/dL   RDW 14.3 11.5 - 15.5 %   Platelets 334 150 - 400 K/uL  Comprehensive metabolic panel     Status: Abnormal   Collection Time: 02/11/17  9:01 AM  Result Value Ref Range   Sodium 135 135 - 145 mmol/L   Potassium 4.2 3.5 - 5.1 mmol/L   Chloride 103 101 - 111  mmol/L   CO2 23 22 - 32 mmol/L   Glucose, Bld 114 (H) 65 - 99 mg/dL   BUN 20 6 - 20 mg/dL   Creatinine, Ser 0.72 0.44 - 1.00 mg/dL   Calcium 8.5 (L) 8.9 - 10.3 mg/dL   Total Protein 6.0 (L) 6.5 - 8.1 g/dL   Albumin 2.6 (L) 3.5 - 5.0 g/dL   AST 22 15 - 41 U/L   ALT 16 14 - 54 U/L   Alkaline Phosphatase 132 (H) 38 - 126  U/L   Total Bilirubin 0.2 (L) 0.3 - 1.2 mg/dL   GFR calc non Af Amer >60 >60 mL/min   GFR calc Af Amer >60 >60 mL/min   Anion gap 9 5 - 15      Imaging:    MAU Course/MDM: I have ordered labs and reviewed results. Labs are normal  NST reviewed and found to be reactive, Category I Consult Dr Corinna Capra with presentation, exam findings and test results.  Treatments in MAU included  EFM.Marland Kitchen    Assessment: 1. Pregnancy-induced hypertension in third trimester   2.     No evidence of preeclampsia 3.     Single IUP at [redacted]w[redacted]d  Plan: Discharge home Preterm Labor precautions and fetal kick counts Follow up in Office for prenatal visits and recheck of BP  Follow-up Information    Louretta Shorten, MD. Go in 3 day(s).   Specialty:  Obstetrics and Gynecology Why:  Office visit Monday or Tuesday with Nonstress test fetal monitoring Contact information: [address], [place] [address] [phone]         Encouraged to return here or to other Urgent Care/ED if she develops worsening of symptoms, increase in pain, fever, or other concerning symptoms.   Pt stable at time of discharge.  Hansel Feinstein CNM, MSN Certified Nurse-Midwife 02/11/2017 11:47 PM

## 2017-02-11 NOTE — MAU Note (Signed)
Pt voided 15cc-sent for PCR

## 2017-02-11 NOTE — Discharge Instructions (Signed)
Hypertension During Pregnancy Hypertension is also called high blood pressure. High blood pressure means that the force of your blood moving in your body is too strong. When you are pregnant, this condition should be watched carefully. It can cause problems for you and your baby. Follow these instructions at home: Eating and drinking  Drink enough fluid to keep your pee (urine) clear or pale yellow.  Eat healthy foods that are low in salt (sodium). ? Do not add salt to your food. ? Check labels on foods and drinks to see much salt is in them. Look on the label where you see "Sodium." Lifestyle  Do not use any products that contain nicotine or tobacco, such as cigarettes and e-cigarettes. If you need help quitting, ask your doctor.  Do not use alcohol.  Avoid caffeine.  Avoid stress. Rest and get plenty of sleep. General instructions  Take over-the-counter and prescription medicines only as told by your doctor.  While lying down, lie on your left side. This keeps pressure off your baby.  While sitting or lying down, raise (elevate) your feet. Try putting some pillows under your lower legs.  Exercise regularly. Ask your doctor what kinds of exercise are best for you.  Keep all prenatal and follow-up visits as told by your doctor. This is important. Contact a doctor if:  You have symptoms that your doctor told you to watch for, such as: ? Fever. ? Throwing up (vomiting). ? Headache. Get help right away if:  You have very bad pain in your belly (abdomen).  You are throwing up, and this does not get better with treatment.  You suddenly get swelling in your hands, ankles, or face.  You gain 4 lb (1.8 kg) or more in 1 week.  You get bleeding from your vagina.  You have blood in your pee.  You do not feel your baby moving as much as normal.  You have a change in vision.  You have muscle twitching or sudden tightening (spasms).  You have trouble breathing.  Your lips  or fingernails turn blue. This information is not intended to replace advice given to you by your health care provider. Make sure you discuss any questions you have with your health care provider. Document Released: 10/02/2010 Document Revised: 05/11/2016 Document Reviewed: 05/11/2016 Elsevier Interactive Patient Education  2017 [employer].  

## 2017-02-14 ENCOUNTER — Encounter: Payer: Self-pay | Admitting: Physician Assistant

## 2017-02-18 ENCOUNTER — Encounter (HOSPITAL_COMMUNITY): Payer: Self-pay

## 2017-02-18 ENCOUNTER — Inpatient Hospital Stay (HOSPITAL_COMMUNITY)
Admission: AD | Admit: 2017-02-18 | Discharge: 2017-02-18 | Disposition: A | Discharge status: Home / Self Care | Source: Ambulatory Visit | Attending: Obstetrics and Gynecology | Admitting: Obstetrics and Gynecology

## 2017-02-18 DIAGNOSIS — O133 Gestational [pregnancy-induced] hypertension without significant proteinuria, third trimester: Secondary | ICD-10-CM | POA: Insufficient documentation

## 2017-02-18 DIAGNOSIS — F329 Major depressive disorder, single episode, unspecified: Secondary | ICD-10-CM | POA: Diagnosis not present

## 2017-02-18 DIAGNOSIS — Z3A34 34 weeks gestation of pregnancy: Secondary | ICD-10-CM | POA: Diagnosis not present

## 2017-02-18 DIAGNOSIS — Z7982 Long term (current) use of aspirin: Secondary | ICD-10-CM | POA: Insufficient documentation

## 2017-02-18 DIAGNOSIS — O479 False labor, unspecified: Secondary | ICD-10-CM | POA: Diagnosis not present

## 2017-02-18 DIAGNOSIS — K429 Umbilical hernia without obstruction or gangrene: Secondary | ICD-10-CM | POA: Diagnosis not present

## 2017-02-18 DIAGNOSIS — O99613 Diseases of the digestive system complicating pregnancy, third trimester: Secondary | ICD-10-CM | POA: Insufficient documentation

## 2017-02-18 DIAGNOSIS — G4730 Sleep apnea, unspecified: Secondary | ICD-10-CM | POA: Diagnosis not present

## 2017-02-18 DIAGNOSIS — O26893 Other specified pregnancy related conditions, third trimester: Secondary | ICD-10-CM | POA: Diagnosis not present

## 2017-02-18 DIAGNOSIS — R109 Unspecified abdominal pain: Secondary | ICD-10-CM | POA: Diagnosis present

## 2017-02-18 DIAGNOSIS — F419 Anxiety disorder, unspecified: Secondary | ICD-10-CM | POA: Insufficient documentation

## 2017-02-18 DIAGNOSIS — O99343 Other mental disorders complicating pregnancy, third trimester: Secondary | ICD-10-CM | POA: Insufficient documentation

## 2017-02-18 DIAGNOSIS — K219 Gastro-esophageal reflux disease without esophagitis: Secondary | ICD-10-CM | POA: Diagnosis not present

## 2017-02-18 DIAGNOSIS — O4703 False labor before 37 completed weeks of gestation, third trimester: Secondary | ICD-10-CM | POA: Insufficient documentation

## 2017-02-18 DIAGNOSIS — E785 Hyperlipidemia, unspecified: Secondary | ICD-10-CM | POA: Insufficient documentation

## 2017-02-18 LAB — CBC
HCT: 35.7 % — ABNORMAL LOW (ref 36.0–46.0)
Hemoglobin: 12.2 g/dL (ref 12.0–15.0)
MCH: 32.8 pg (ref 26.0–34.0)
MCHC: 34.2 g/dL (ref 30.0–36.0)
MCV: 96 fL (ref 78.0–100.0)
Platelets: 336 10*3/uL (ref 150–400)
RBC: 3.72 MIL/uL — ABNORMAL LOW (ref 3.87–5.11)
RDW: 14.5 % (ref 11.5–15.5)
WBC: 12.4 10*3/uL — ABNORMAL HIGH (ref 4.0–10.5)

## 2017-02-18 LAB — COMPREHENSIVE METABOLIC PANEL
ALT: 16 U/L (ref 14–54)
AST: 23 U/L (ref 15–41)
Albumin: 2.8 g/dL — ABNORMAL LOW (ref 3.5–5.0)
Alkaline Phosphatase: 95 U/L (ref 38–126)
Anion gap: 7 (ref 5–15)
BUN: 16 mg/dL (ref 6–20)
CO2: 21 mmol/L — ABNORMAL LOW (ref 22–32)
Calcium: 8.7 mg/dL — ABNORMAL LOW (ref 8.9–10.3)
Chloride: 106 mmol/L (ref 101–111)
Creatinine, Ser: 0.69 mg/dL (ref 0.44–1.00)
GFR calc Af Amer: >60 mL/min (ref >60)
GFR calc non Af Amer: >60 mL/min (ref >60)
Glucose, Bld: 90 mg/dL (ref 65–99)
Potassium: 4.6 mmol/L (ref 3.5–5.1)
Sodium: 134 mmol/L — ABNORMAL LOW (ref 135–145)
Total Bilirubin: 0.2 mg/dL — ABNORMAL LOW (ref 0.3–1.2)
Total Protein: 5.6 g/dL — ABNORMAL LOW (ref 6.5–8.1)

## 2017-02-18 LAB — URINALYSIS, ROUTINE W REFLEX MICROSCOPIC
Glucose, UA: NEGATIVE mg/dL
Hgb urine dipstick: NEGATIVE
Ketones, ur: NEGATIVE mg/dL
Leukocytes, UA: NEGATIVE
Nitrite: NEGATIVE
Protein, ur: 30 mg/dL — AB
Specific Gravity, Urine: 1.032 — ABNORMAL HIGH (ref 1.005–1.030)
pH: 5 (ref 5.0–8.0)

## 2017-02-18 LAB — PROTEIN / CREATININE RATIO, URINE
Creatinine, Urine: 324 mg/dL
Protein Creatinine Ratio: 0.07 mg/mg{Cre} (ref 0.00–0.15)
Total Protein, Urine: 24 mg/dL

## 2017-02-18 NOTE — MAU Provider Note (Signed)
History     CSN: [id number]  Arrival date and time: 02/18/17 1547  First Provider Initiated Contact with Patient 02/18/17 1641   Chief Complaint  Patient presents with  . Abdominal Pain   HPI Bonnie Hall is a 56 y.o. G2P0102 at [redacted]w[redacted]d who presents with abdominal pain. Symptoms began this morning at 3 am. Reports intermittent abdominal tightening associated with the pain. Nothing makes pain better or worse. Patient is being monitored for gestational hypertension & is currently taking labetalol 100 mg BID. Denies headache, visual disturbance, epigastric pain, n/v, vaginal bleeding, or LOF. Positive fetal movement.   OB History    Gravida Para Term Preterm AB Living   2 1   1   2    SAB TAB Ectopic Multiple Live Births         1 2      Past Medical History:  Diagnosis Date  . Allergy    Hay fever/exercise induced asthma  . Anxiety   . Blood transfusion 1990's  . Depression    bipolar  . GERD (gastroesophageal reflux disease)   . Hyperlipidemia   . Hypertension   . Iron deficiency anemia 1990's   negative work up as to etiology; required iron infusion, blood transfusion.  Saw an hematologist  at [facility].   . Migraine   . Preterm labor   . Sleep apnea   . Thrombocytosis ([place])     Past Surgical History:  Procedure Laterality Date  . CESAREAN SECTION  2009  . COLONOSCOPY  06/2012   neg. with [place]  . NASAL SEPTUM SURGERY  1983  . TONSILLECTOMY  1966    Family History  Problem Relation Age of Onset  . Colon cancer Paternal Uncle 68  . Colon cancer Paternal Uncle 53  . Prostate cancer Mother 40  . Breast cancer Paternal Aunt     Social History  Substance Use Topics  . Smoking status: Never Smoker  . Smokeless tobacco: Never Used  . Alcohol use No    Allergies: No Known Allergies  Prescriptions Prior to Admission  Medication Sig Dispense Refill Last Dose  . acetaminophen-codeine (TYLENOL #3) 300-30 MG tablet Take 1 tablet by mouth every 4 (four) hours as  needed.  0 02/18/2017 at Unknown time  . albuterol (VENTOLIN HFA) 108 (90 Base) MCG/ACT inhaler Inhale 2 puffs into the lungs every 4 (four) hours as needed for wheezing or shortness of breath. 1 Inhaler 0 Past Month at Unknown time  . ALPRAZolam (XANAX) 0.5 MG tablet Take 0.5 mg by mouth as needed.   Past Week at Unknown time  . aspirin EC 81 MG tablet Take 1 tablet (81 mg total) by mouth daily.   02/17/2017 at Unknown time  . butalbital-acetaminophen-caffeine (FIORICET, ESGIC) 50-325-40 MG tablet Take one tablet every 12 hours as needed for pain 100 tablet 0 Past Month at Unknown time  . chlorpheniramine-HYDROcodone (TUSSIONEX) 10-8 MG/5ML SUER Take 5 mLs by mouth every 12 (twelve) hours as needed for cough.   Past Week at Unknown time  . fluticasone (FLONASE) 50 MCG/ACT nasal spray Place 2 sprays into both nostrils daily. 16 g 2 Past Month at Unknown time  . folic acid (FOLVITE) 947 MCG tablet Take 800 mcg by mouth daily.    02/17/2017 at Unknown time  . guaiFENesin (MUCUS-ER) 600 MG 12 hr tablet Take 600 mg by mouth 2 (two) times daily as needed for cough or to loosen phlegm.    02/18/2017 at Unknown time  .  labetalol (NORMODYNE) 100 MG tablet Take 1 tablet (100 mg total) by mouth 2 (two) times daily. 60 tablet 2 02/18/2017 at 0900  . lamoTRIgine (LAMICTAL) 150 MG tablet Take 150 mg by mouth daily.   02/17/2017 at Unknown time  . lurasidone (LATUDA) 20 MG TABS tablet Take 10 mg by mouth daily.   02/17/2017 at Unknown time  . Melatonin 3 MG CAPS Take 2 capsules by mouth at bedtime.    02/17/2017 at Unknown time  . montelukast (SINGULAIR) 10 MG tablet Take 1 tablet (10 mg total) by mouth at bedtime. 90 tablet 3 Past Week at Unknown time  . pantoprazole (PROTONIX) 40 MG tablet Take 40 mg by mouth 2 (two) times daily.   02/18/2017 at Unknown time  . predniSONE (DELTASONE) 10 MG tablet Take 10 mg by mouth daily as needed (cough).    02/18/2017 at Unknown time  . Prenatal Vit-Fe Fumarate-FA (PRENATAL MULTIVITAMIN) TABS  tablet Take 1 tablet by mouth daily at 12 noon.   02/17/2017 at Unknown time  . PRESCRIPTION MEDICATION Take 10 mLs by mouth. Swich and swallow every 6 hours  [facility] magic mouthwash   02/18/2017 at Unknown time  . vortioxetine HBr (TRINTELLIX) 20 MG TABS Take 20 mg by mouth daily.    02/17/2017 at Unknown time  . loratadine (CLARITIN) 10 MG tablet Take 1 tablet (10 mg total) by mouth daily. 90 tablet 6 prn    Review of Systems  Constitutional: Negative.   Eyes: Negative for visual disturbance.  Gastrointestinal: Positive for abdominal pain. Negative for constipation, diarrhea, nausea and vomiting.  Genitourinary: Negative.   Neurological: Negative for headaches.   Physical Exam   Blood pressure (!) 143/74, pulse 81, temperature 98.9 F (37.2 C), temperature source Oral, resp. rate 18.  Physical Exam  Nursing note and vitals reviewed. Constitutional: She is oriented to person, place, and time. She appears well-developed and well-nourished. No distress.  HENT:  Head: Normocephalic and atraumatic.  Eyes: Conjunctivae are normal. Right eye exhibits no discharge. Left eye exhibits no discharge. No scleral icterus.  Neck: Normal range of motion.  Cardiovascular: Normal rate, regular rhythm and normal heart sounds.   No murmur heard. Respiratory: Effort normal and breath sounds normal. No respiratory distress. She has no wheezes.  GI: Soft. Bowel sounds are normal. There is no tenderness. A hernia (1 cm umbilical, reducible) is present.  Musculoskeletal: She exhibits edema (BLE, 2+ pitting).  Neurological: She is alert and oriented to person, place, and time. She has normal reflexes.  No clonus  Skin: Skin is warm and dry. She is not diaphoretic.  Psychiatric: She has a normal mood and affect. Her behavior is normal. Judgment and thought content normal.   Dilation: Closed Effacement (%): Thick Cervical Position: Posterior Station: -3 Exam by:: Jorje Guild, NP  Fetal  Tracing:  Baseline: 135 Variability: moderate Accelerations: 15x15 Decelerations: none  Toco: Q6-10 mins MAU Course  Procedures Results for orders placed or performed during the hospital encounter of 02/18/17 (from the past 24 hour(s))  Urinalysis, Routine w reflex microscopic     Status: Abnormal   Collection Time: 02/18/17  4:00 PM  Result Value Ref Range   Color, Urine AMBER (A) YELLOW   APPearance HAZY (A) CLEAR   Specific Gravity, Urine 1.032 (H) 1.005 - 1.030   pH 5.0 5.0 - 8.0   Glucose, UA NEGATIVE NEGATIVE mg/dL   Hgb urine dipstick NEGATIVE NEGATIVE   Bilirubin Urine MODERATE (A) NEGATIVE   Ketones, ur NEGATIVE  NEGATIVE mg/dL   Protein, ur 30 (A) NEGATIVE mg/dL   Nitrite NEGATIVE NEGATIVE   Leukocytes, UA NEGATIVE NEGATIVE   RBC / HPF 0-5 0 - 5 RBC/hpf   WBC, UA 0-5 0 - 5 WBC/hpf   Bacteria, UA RARE (A) NONE SEEN   Squamous Epithelial / LPF 6-30 (A) NONE SEEN   Mucous PRESENT    Hyaline Casts, UA PRESENT   Protein / creatinine ratio, urine     Status: None   Collection Time: 02/18/17  4:00 PM  Result Value Ref Range   Creatinine, Urine 324.00 mg/dL   Total Protein, Urine 24 mg/dL   Protein Creatinine Ratio 0.07 0.00 - 0.15 mg/mg[Cre]  CBC     Status: Abnormal   Collection Time: 02/18/17  4:55 PM  Result Value Ref Range   WBC 12.4 (H) 4.0 - 10.5 K/uL   RBC 3.72 (L) 3.87 - 5.11 MIL/uL   Hemoglobin 12.2 12.0 - 15.0 g/dL   HCT 35.7 (L) 36.0 - 46.0 %   MCV 96.0 78.0 - 100.0 fL   MCH 32.8 26.0 - 34.0 pg   MCHC 34.2 30.0 - 36.0 g/dL   RDW 14.5 11.5 - 15.5 %   Platelets 336 150 - 400 K/uL  Comprehensive metabolic panel     Status: Abnormal   Collection Time: 02/18/17  4:55 PM  Result Value Ref Range   Sodium 134 (L) 135 - 145 mmol/L   Potassium 4.6 3.5 - 5.1 mmol/L   Chloride 106 101 - 111 mmol/L   CO2 21 (L) 22 - 32 mmol/L   Glucose, Bld 90 65 - 99 mg/dL   BUN 16 6 - 20 mg/dL   Creatinine, Ser 0.69 0.44 - 1.00 mg/dL   Calcium 8.7 (L) 8.9 - 10.3 mg/dL    Total Protein 5.6 (L) 6.5 - 8.1 g/dL   Albumin 2.8 (L) 3.5 - 5.0 g/dL   AST 23 15 - 41 U/L   ALT 16 14 - 54 U/L   Alkaline Phosphatase 95 38 - 126 U/L   Total Bilirubin 0.2 (L) 0.3 - 1.2 mg/dL   GFR calc non Af Amer >60 >60 mL/min   GFR calc Af Amer >60 >60 mL/min   Anion gap 7 5 - 15    MDM Reactive fetal tracing Ctx every 6-10 mins Cervix closed/long Elevated BPs, none severe range CBC, CMP, urine PCR Discussed patient with Dr. Gabriel Rung to discharge home. Patient to keep f/u appointment next week At time of discharge patient pointed to umbilicus states she also has pain there. On exam patient has small umbilical hernia. Hernia is reducible; no evidence of incarceration.   Assessment and Plan  A; 1. Braxton Hicks contractions   2. Pregnancy-induced hypertension in third trimester   3. Umbilical hernia without obstruction and without gangrene    P: Discharge home Discussed reasons to return to MAU; including s/s preeclampsia, PTL, & worsening umbilical pain Keep f/u with OB Continue labetalol as prescribed  Jorje Guild 02/18/2017, 4:40 PM

## 2017-02-18 NOTE — Discharge Instructions (Signed)
Braxton Hicks Contractions °Contractions of the uterus can occur throughout pregnancy, but they are not always a sign that you are in labor. You may have practice contractions called Braxton Hicks contractions. These false labor contractions are sometimes confused with true labor. °What are Braxton Hicks contractions? °Braxton Hicks contractions are tightening movements that occur in the muscles of the uterus before labor. Unlike true labor contractions, these contractions do not result in opening (dilation) and thinning of the cervix. Toward the end of pregnancy (32-34 weeks), Braxton Hicks contractions can happen more often and may become stronger. These contractions are sometimes difficult to tell apart from true labor because they can be very uncomfortable. You should not feel embarrassed if you go to the hospital with false labor. °Sometimes, the only way to tell if you are in true labor is for your health care provider to look for changes in the cervix. The health care provider will do a physical exam and may monitor your contractions. If you are not in true labor, the exam should show that your cervix is not dilating and your water has not broken. °If there are no prenatal problems or other health problems associated with your pregnancy, it is completely safe for you to be sent home with false labor. You may continue to have Braxton Hicks contractions until you go into true labor. °How can I tell the difference between true labor and false labor? °· Differences °? False labor °? Contractions last 30-70 seconds.: Contractions are usually shorter and not as strong as true labor contractions. °? Contractions become very regular.: Contractions are usually irregular. °? Discomfort is usually felt in the top of the uterus, and it spreads to the lower abdomen and low back.: Contractions are often felt in the front of the lower abdomen and in the groin. °? Contractions do not go away with walking.: Contractions may  go away when you walk around or change positions while lying down. °? Contractions usually become more intense and increase in frequency.: Contractions get weaker and are shorter-lasting as time goes on. °? The cervix dilates and gets thinner.: The cervix usually does not dilate or become thin. °Follow these instructions at home: °· Take over-the-counter and prescription medicines only as told by your health care provider. °· Keep up with your usual exercises and follow other instructions from your health care provider. °· Eat and drink lightly if you think you are going into labor. °· If Braxton Hicks contractions are making you uncomfortable: °? Change your position from lying down or resting to walking, or change from walking to resting. °? Sit and rest in a tub of warm water. °? Drink enough fluid to keep your urine clear or pale yellow. Dehydration may cause these contractions. °? Do slow and deep breathing several times an hour. °· Keep all follow-up prenatal visits as told by your health care provider. This is important. °Contact a health care provider if: °· You have a fever. °· You have continuous pain in your abdomen. °Get help right away if: °· Your contractions become stronger, more regular, and closer together. °· You have fluid leaking or gushing from your vagina. °· You pass blood-tinged mucus (bloody show). °· You have bleeding from your vagina. °· You have low back pain that you never had before. °· You feel your baby’s head pushing down and causing pelvic pressure. °· Your baby is not moving inside you as much as it used to. °Summary °· Contractions that occur before labor are   called Braxton Hicks contractions, false labor, or practice contractions.  Braxton Hicks contractions are usually shorter, weaker, farther apart, and less regular than true labor contractions. True labor contractions usually become progressively stronger and regular and they become more frequent.  Manage discomfort from  [facility] contractions by changing position, resting in a warm bath, drinking plenty of water, or practicing deep breathing. This information is not intended to replace advice given to you by your health care provider. Make sure you discuss any questions you have with your health care provider. Document Released: 08/30/2005 Document Revised: 07/19/2016 Document Reviewed: 07/19/2016 Elsevier Interactive Patient Education  2017 Elsevier Inc[employer]  Umbilical Hernia, Adult A hernia is a bulge of tissue that pushes through an opening between muscles. An umbilical hernia happens in the abdomen, near the belly button (umbilicus). The hernia may contain tissues from the small intestine, large intestine, or fatty tissue covering the intestines (omentum). Umbilical hernias in adults tend to get worse over time, and they require surgical treatment. There are several types of umbilical hernias. You may have:  A hernia located just above or below the umbilicus (indirect hernia). This is the most common type of umbilical hernia in adults.  A hernia that forms through an opening formed by the umbilicus (direct hernia).  A hernia that comes and goes (reducible hernia). A reducible hernia may be visible only when you strain, lift something heavy, or cough. This type of hernia can be pushed back into the abdomen (reduced).  A hernia that traps abdominal tissue inside the hernia (incarcerated hernia). This type of hernia cannot be reduced.  A hernia that cuts off blood flow to the tissues inside the hernia (strangulated hernia). The tissues can start to die if this happens. This type of hernia requires emergency treatment.  What are the causes? An umbilical hernia happens when tissue inside the abdomen presses on a weak area of the abdominal muscles. What increases the risk? You may have a greater risk of this condition if you:  Are obese.  Have had several pregnancies.  Have a buildup of fluid inside your  abdomen (ascites).  Have had surgery that weakens the abdominal muscles.  What are the signs or symptoms? The main symptom of this condition is a painless bulge at or near the belly button. A reducible hernia may be visible only when you strain, lift something heavy, or cough. Other symptoms may include:  Dull pain.  A feeling of pressure.  Symptoms of a strangulated hernia may include:  Pain that gets increasingly worse.  Nausea and vomiting.  Pain when pressing on the hernia.  Skin over the hernia becoming red or purple.  Constipation.  Blood in the stool.  How is this diagnosed? This condition may be diagnosed based on:  A physical exam. You may be asked to cough or strain while standing. These actions increase the pressure inside your abdomen and force the hernia through the opening in your muscles. Your health care provider may try to reduce the hernia by pressing on it.  Your symptoms and medical history.  How is this treated? Surgery is the only treatment for an umbilical hernia. Surgery for a strangulated hernia is done as soon as possible. If you have a small hernia that is not incarcerated, you may need to lose weight before having surgery. Follow these instructions at home:  Lose weight, if told by your health care provider.  Do not try to push the hernia back in.  Watch your  hernia for any changes in color or size. Tell your health care provider if any changes occur.  You may need to avoid activities that increase pressure on your hernia.  Do not lift anything that is heavier than 10 lb (4.5 kg) until your health care provider says that this is safe.  Take over-the-counter and prescription medicines only as told by your health care provider.  Keep all follow-up visits as told by your health care provider. This is important. Contact a health care provider if:  Your hernia gets larger.  Your hernia becomes painful. Get help right away if:  You develop  sudden, severe pain near the area of your hernia.  You have pain as well as nausea or vomiting.  You have pain and the skin over your hernia changes color.  You develop a fever. This information is not intended to replace advice given to you by your health care provider. Make sure you discuss any questions you have with your health care provider. Document Released: 01/30/2016 Document Revised: 05/02/2016 Document Reviewed: 01/30/2016 Elsevier Interactive Patient Education  [employer].

## 2017-02-18 NOTE — MAU Note (Signed)
Pt. States that she woke up at 3 am with abdominal pain that has been constant. No vaginal bleeding or discharge. Pt. Has felt fetal movement today.

## 2017-03-04 ENCOUNTER — Encounter (HOSPITAL_COMMUNITY): Payer: Self-pay

## 2017-03-05 ENCOUNTER — Encounter (HOSPITAL_COMMUNITY): Payer: Self-pay

## 2017-03-05 ENCOUNTER — Inpatient Hospital Stay (HOSPITAL_COMMUNITY)
Admission: AD | Admit: 2017-03-05 | Discharge: 2017-03-05 | Disposition: A | Discharge status: Home / Self Care | Source: Ambulatory Visit | Attending: Obstetrics and Gynecology | Admitting: Obstetrics and Gynecology

## 2017-03-05 DIAGNOSIS — O99353 Diseases of the nervous system complicating pregnancy, third trimester: Secondary | ICD-10-CM | POA: Diagnosis not present

## 2017-03-05 DIAGNOSIS — O99613 Diseases of the digestive system complicating pregnancy, third trimester: Secondary | ICD-10-CM | POA: Diagnosis not present

## 2017-03-05 DIAGNOSIS — O163 Unspecified maternal hypertension, third trimester: Principal | ICD-10-CM | POA: Insufficient documentation

## 2017-03-05 DIAGNOSIS — O133 Gestational [pregnancy-induced] hypertension without significant proteinuria, third trimester: Secondary | ICD-10-CM | POA: Diagnosis not present

## 2017-03-05 DIAGNOSIS — O09523 Supervision of elderly multigravida, third trimester: Secondary | ICD-10-CM | POA: Insufficient documentation

## 2017-03-05 DIAGNOSIS — O99413 Diseases of the circulatory system complicating pregnancy, third trimester: Secondary | ICD-10-CM | POA: Diagnosis not present

## 2017-03-05 DIAGNOSIS — O99343 Other mental disorders complicating pregnancy, third trimester: Secondary | ICD-10-CM | POA: Insufficient documentation

## 2017-03-05 DIAGNOSIS — Z3689 Encounter for other specified antenatal screening: Secondary | ICD-10-CM

## 2017-03-05 DIAGNOSIS — Z3A36 36 weeks gestation of pregnancy: Secondary | ICD-10-CM | POA: Diagnosis not present

## 2017-03-05 DIAGNOSIS — E785 Hyperlipidemia, unspecified: Secondary | ICD-10-CM | POA: Insufficient documentation

## 2017-03-05 DIAGNOSIS — F419 Anxiety disorder, unspecified: Secondary | ICD-10-CM | POA: Diagnosis not present

## 2017-03-05 DIAGNOSIS — Z0130 Encounter for examination of blood pressure without abnormal findings: Secondary | ICD-10-CM

## 2017-03-05 DIAGNOSIS — K219 Gastro-esophageal reflux disease without esophagitis: Secondary | ICD-10-CM | POA: Diagnosis not present

## 2017-03-05 DIAGNOSIS — Z7982 Long term (current) use of aspirin: Secondary | ICD-10-CM | POA: Diagnosis not present

## 2017-03-05 LAB — URINALYSIS, ROUTINE W REFLEX MICROSCOPIC
Bilirubin Urine: NEGATIVE
Glucose, UA: NEGATIVE mg/dL
Ketones, ur: NEGATIVE mg/dL
Leukocytes, UA: NEGATIVE
Nitrite: NEGATIVE
Protein, ur: NEGATIVE mg/dL
Specific Gravity, Urine: >1.03 — ABNORMAL HIGH (ref 1.005–1.030)
pH: 5.5 (ref 5.0–8.0)

## 2017-03-05 LAB — URINALYSIS, MICROSCOPIC (REFLEX)

## 2017-03-05 NOTE — Discharge Instructions (Signed)
Hypertension During Pregnancy Hypertension is also called high blood pressure. High blood pressure means that the force of your blood moving in your body is too strong. When you are pregnant, this condition should be watched carefully. It can cause problems for you and your baby. Follow these instructions at home: Eating and drinking  Drink enough fluid to keep your pee (urine) clear or pale yellow.  Eat healthy foods that are low in salt (sodium). ? Do not add salt to your food. ? Check labels on foods and drinks to see much salt is in them. Look on the label where you see "Sodium." Lifestyle  Do not use any products that contain nicotine or tobacco, such as cigarettes and e-cigarettes. If you need help quitting, ask your doctor.  Do not use alcohol.  Avoid caffeine.  Avoid stress. Rest and get plenty of sleep. General instructions  Take over-the-counter and prescription medicines only as told by your doctor.  While lying down, lie on your left side. This keeps pressure off your baby.  While sitting or lying down, raise (elevate) your feet. Try putting some pillows under your lower legs.  Exercise regularly. Ask your doctor what kinds of exercise are best for you.  Keep all prenatal and follow-up visits as told by your doctor. This is important. Contact a doctor if:  You have symptoms that your doctor told you to watch for, such as: ? Fever. ? Throwing up (vomiting). ? Headache. Get help right away if:  You have very bad pain in your belly (abdomen).  You are throwing up, and this does not get better with treatment.  You suddenly get swelling in your hands, ankles, or face.  You gain 4 lb (1.8 kg) or more in 1 week.  You get bleeding from your vagina.  You have blood in your pee.  You do not feel your baby moving as much as normal.  You have a change in vision.  You have muscle twitching or sudden tightening (spasms).  You have trouble breathing.  Your lips  or fingernails turn blue. This information is not intended to replace advice given to you by your health care provider. Make sure you discuss any questions you have with your health care provider. Document Released: 10/02/2010 Document Revised: 05/11/2016 Document Reviewed: 05/11/2016 Elsevier Interactive Patient Education  2017 [employer].  

## 2017-03-05 NOTE — MAU Provider Note (Signed)
History     CSN: [id number]  Arrival date and time: 03/05/17 0901   None     Chief Complaint  Patient presents with  . Hypertension   HPI   Ms.Bonnie Hall is a 56 y.o. female G14P0102 @ [redacted]w[redacted]d here in MAU for a BP check. She was seen in the office yesterday and had an elevated BP. Today she feels good. She denies HA, or changes in her vision. + fetal movement. No bleeding or leaking of fluid. She is scheduled for another BP check in the office on Monday, and her cesarean section is scheduled for 6/27.    OB History    Gravida Para Term Preterm AB Living   2 1   1   2    SAB TAB Ectopic Multiple Live Births         1 2      Past Medical History:  Diagnosis Date  . Allergy    Hay fever/exercise induced asthma  . AMA (advanced maternal age) multigravida 83+   . Anxiety   . Blood transfusion 1990's  . Depression    bipolar  . GERD (gastroesophageal reflux disease)   . Hyperlipidemia   . Iron deficiency anemia 1990's   negative work up as to etiology; required iron infusion, blood transfusion.  Saw an hematologist  at [facility].   . Migraine   . Newborn product of in vitro fertilization (IVF) pregnancy   . Pregnancy induced hypertension   . Preterm labor   . Sleep apnea   . Thrombocytosis ([place])   . Vaginal Pap smear, abnormal     Past Surgical History:  Procedure Laterality Date  . CESAREAN SECTION  2009  . COLONOSCOPY  06/2012   neg. with [place]  . GYNECOLOGIC CRYOSURGERY    . NASAL SEPTUM SURGERY  1983  . TONSILLECTOMY  1966    Family History  Problem Relation Age of Onset  . Colon cancer Paternal Uncle 54  . Colon cancer Paternal Uncle 53  . Prostate cancer Father   . Breast cancer Paternal Aunt   . Stroke Maternal Grandmother   . Stroke Maternal Grandfather   . Hearing loss Daughter     Social History  Substance Use Topics  . Smoking status: Never Smoker  . Smokeless tobacco: Never Used  . Alcohol use No    Allergies: No Active  Allergies  Prescriptions Prior to Admission  Medication Sig Dispense Refill Last Dose  . acetaminophen-codeine (TYLENOL #3) 300-30 MG tablet Take 1-2 tablets by mouth every 4 (four) hours as needed (cough).   0 03/03/2017 at Unknown time  . albuterol (VENTOLIN HFA) 108 (90 Base) MCG/ACT inhaler Inhale 2 puffs into the lungs every 4 (four) hours as needed for wheezing or shortness of breath. 1 Inhaler 0 Past Month at Unknown time  . ALPRAZolam (XANAX) 0.5 MG tablet Take 0.5 mg by mouth 2 (two) times daily as needed for anxiety.    03/04/2017 at Unknown time  . aspirin EC 81 MG tablet Take 1 tablet (81 mg total) by mouth daily. (Patient taking differently: Take 81 mg by mouth at bedtime. )   03/03/2017 at Unknown time  . butalbital-acetaminophen-caffeine (FIORICET, ESGIC) 50-325-40 MG tablet Take one tablet every 12 hours as needed for pain 100 tablet 0 03/03/2017 at Unknown time  . DimenhyDRINATE (MOTION SICKNESS RELIEF PO) Take 1-2 tablets by mouth at bedtime as needed (motion sickness).   03/03/2017 at Unknown time  . diphenhydrAMINE (BENADRYL) 25 MG tablet  Take 25 mg by mouth at bedtime as needed for allergies.   Past Week at Unknown time  . esomeprazole (NEXIUM) 40 MG capsule Take 40 mg by mouth 2 (two) times daily.   03/04/2017 at Unknown time  . fluticasone (FLONASE) 50 MCG/ACT nasal spray Place 2 sprays into both nostrils daily. (Patient taking differently: Place 2 sprays into both nostrils daily as needed for allergies. ) 16 g 2 Past Month at Unknown time  . folic acid (FOLVITE) 841 MCG tablet Take 800 mcg by mouth at bedtime.    03/04/2017 at Unknown time  . guaiFENesin (MUCUS-ER) 600 MG 12 hr tablet Take 600 mg by mouth 2 (two) times daily as needed for cough or to loosen phlegm.    Past Month at Unknown time  . labetalol (NORMODYNE) 100 MG tablet Take 1 tablet (100 mg total) by mouth 2 (two) times daily. (Patient taking differently: Take 200 mg by mouth 2 (two) times daily. ) 60 tablet 2 03/04/2017  at Unknown time  . lamoTRIgine (LAMICTAL) 150 MG tablet Take 150 mg by mouth at bedtime.    03/03/2017 at Unknown time  . lurasidone (LATUDA) 20 MG TABS tablet Take 10 mg by mouth at bedtime.    03/03/2017 at Unknown time  . Melatonin 3 MG CAPS Take 6 mg by mouth at bedtime.    03/03/2017 at Unknown time  . montelukast (SINGULAIR) 10 MG tablet Take 1 tablet (10 mg total) by mouth at bedtime. 90 tablet 3 03/03/2017 at Unknown time  . Prenatal Vit-Fe Fumarate-FA (PRENATAL MULTIVITAMIN) TABS tablet Take 1 tablet by mouth at bedtime.    03/03/2017 at Unknown time  . PRESCRIPTION MEDICATION Take 10 mLs by mouth every 6 (six) hours. Swich and swallow every 6 hours  [facility] magic mouthwash    03/04/2017 at Unknown time  . vortioxetine HBr (TRINTELLIX) 20 MG TABS Take 20 mg by mouth at bedtime.    03/03/2017 at Unknown time   Results for orders placed or performed during the hospital encounter of 03/05/17 (from the past 48 hour(s))  Urinalysis, Routine w reflex microscopic     Status: Abnormal   Collection Time: 03/05/17  9:15 AM  Result Value Ref Range   Color, Urine YELLOW YELLOW   APPearance CLEAR CLEAR   Specific Gravity, Urine >1.030 (H) 1.005 - 1.030   pH 5.5 5.0 - 8.0   Glucose, UA NEGATIVE NEGATIVE mg/dL   Hgb urine dipstick TRACE (A) NEGATIVE   Bilirubin Urine NEGATIVE NEGATIVE   Ketones, ur NEGATIVE NEGATIVE mg/dL   Protein, ur NEGATIVE NEGATIVE mg/dL   Nitrite NEGATIVE NEGATIVE   Leukocytes, UA NEGATIVE NEGATIVE  Urinalysis, Microscopic (reflex)     Status: Abnormal   Collection Time: 03/05/17  9:15 AM  Result Value Ref Range   RBC / HPF 0-5 0 - 5 RBC/hpf   WBC, UA 0-5 0 - 5 WBC/hpf   Bacteria, UA FEW (A) NONE SEEN   Squamous Epithelial / LPF 0-5 (A) NONE SEEN    Review of Systems  Eyes: Negative for photophobia and visual disturbance.  Gastrointestinal: Negative for abdominal pain.  Neurological: Negative for headaches.   Physical Exam   Blood pressure (!) 114/59, pulse 72,  temperature 98.2 F (36.8 C), temperature source Oral, resp. rate 18, height 5\' 6"  (1.676 m), weight 208 lb (94.3 kg), SpO2 94 %.  Patient Vitals for the past 24 hrs:  BP Temp Temp src Pulse Resp SpO2 Height Weight  03/05/17 0948 - - - - -  94 % - -  03/05/17 0946 (!) 114/59 - - 72 - - - -  03/05/17 0931 121/62 - - 75 - - - -  03/05/17 0925 132/86 - - 77 - - 5\' 6"  (1.676 m) 208 lb (94.3 kg)  03/05/17 0923 139/72 98.2 F (36.8 C) Oral 77 18 94 % - -    Physical Exam  Constitutional: She is oriented to person, place, and time. She appears well-developed and well-nourished. No distress.  HENT:  Head: Normocephalic.  Eyes: Pupils are equal, round, and reactive to light.  Respiratory: Effort normal and breath sounds normal.  GI: Soft. She exhibits no distension. There is no tenderness.  Musculoskeletal: She exhibits edema (1+ bilateral lower extremities ).  Neurological: She is alert and oriented to person, place, and time. She has normal reflexes. She displays normal reflexes.  Skin: Skin is warm. She is not diaphoretic.  Psychiatric: Her behavior is normal.   Fetal Tracing: Baseline: 130 bpm Variability: moderate  Accelerations: 15x15 Decelerations: none Toco: quiet   MAU Course  Procedures  None  MDM  BP readings WNL Absent proteinuria.  Reactive NST.  Discussed BP readings with Dr. Julien Girt. [place] for discharge home. Patient to follow up Monday as planned .   Assessment and Plan   A:  1. Blood pressure check   2. NST (non-stress test) reactive     P:  Discharge home in stable condition Strict return precautions Preeclampsia precautions Go to the office on Monday for a BP check   Kenden Brandt, Artist Pais, NP 03/05/2017 10:17 AM

## 2017-03-05 NOTE — MAU Note (Signed)
Pt states she was at MD office yesterday and her blood pressure was a little elevated. Pt states her blood pressure has been up and down for the last couple of weeks. Pt denies headache, blurred vision, and epigastric pain. Pt denies contractions, bleeding, and leaking. Pt states baby is moving normally.

## 2017-03-08 ENCOUNTER — Encounter (HOSPITAL_COMMUNITY)
Admission: RE | Admit: 2017-03-08 | Discharge: 2017-03-08 | Disposition: A | Discharge status: Home / Self Care | Source: Ambulatory Visit | Attending: Obstetrics and Gynecology | Admitting: Obstetrics and Gynecology

## 2017-03-08 DIAGNOSIS — Z01812 Encounter for preprocedural laboratory examination: Secondary | ICD-10-CM

## 2017-03-08 DIAGNOSIS — O34219 Maternal care for unspecified type scar from previous cesarean delivery: Secondary | ICD-10-CM | POA: Insufficient documentation

## 2017-03-08 DIAGNOSIS — Z3A40 40 weeks gestation of pregnancy: Secondary | ICD-10-CM | POA: Insufficient documentation

## 2017-03-08 HISTORY — DX: Gestational (pregnancy-induced) hypertension without significant proteinuria, unspecified trimester: O13.9

## 2017-03-08 HISTORY — DX: Supervision of elderly multigravida, unspecified trimester: O09.529

## 2017-03-08 HISTORY — DX: Unspecified abnormal cytological findings in specimens from vagina: R87.629

## 2017-03-08 LAB — CBC
HCT: 38.2 % (ref 36.0–46.0)
Hemoglobin: 12.4 g/dL (ref 12.0–15.0)
MCH: 31.3 pg (ref 26.0–34.0)
MCHC: 32.5 g/dL (ref 30.0–36.0)
MCV: 96.5 fL (ref 78.0–100.0)
Platelets: 271 10*3/uL (ref 150–400)
RBC: 3.96 MIL/uL (ref 3.87–5.11)
RDW: 14.7 % (ref 11.5–15.5)
WBC: 8.3 10*3/uL (ref 4.0–10.5)

## 2017-03-08 LAB — ABO/RH: ABO/RH(D): A POS

## 2017-03-08 LAB — TYPE AND SCREEN
ABO/RH(D): A POS
Antibody Screen: NEGATIVE

## 2017-03-08 NOTE — Pre-Procedure Instructions (Signed)
Verified meds with Dr Sabra Heck and pt is to take all meds as normal for CS

## 2017-03-08 NOTE — Patient Instructions (Signed)
[name]  03/08/2017   Your procedure is scheduled on:  03/09/2017  Enter through the Main Entrance of [facility] at [place] up the phone at the desk and dial [phone].   Call this number if you have problems the morning of surgery: [phone]   Remember:   Do not eat food:After Midnight.  Do not drink clear liquids: 6 Hours before arrival.  Take these medicines the morning of surgery with A SIP OF WATER: take all meds as usual   Do not wear jewelry, make-up or nail polish.  Do not wear lotions, powders, or perfumes. Do not wear deodorant.  Do not shave 48 hours prior to surgery.  Do not bring valuables to the hospital.  [facility] is not   responsible for any belongings or valuables brought to the hospital.  Contacts, dentures or bridgework may not be worn into surgery.  Leave suitcase in the car. After surgery it may be brought to your room.  For patients admitted to the hospital, checkout time is 11:00 AM the day of              discharge.   Patients discharged the day of surgery will not be allowed to drive             home.  Name and phone number of your driver: na  Special Instructions:   N/A   Please read over the following fact sheets that you were given:   Surgical Site Infection Prevention

## 2017-03-08 NOTE — H&P (Signed)
Bonnie Hall is 56 year old G 2 P 0102 at term for repeat LTCS. She has had worsening BPs over last several weeks. Now on Labetalol.  Patient has history of Severe PIH with first pregnancy  OB History    Gravida Para Term Preterm AB Living   2 1   1   2    SAB TAB Ectopic Multiple Live Births         1 2     Past Medical History:  Diagnosis Date  . Allergy    Hay fever/exercise induced asthma  . AMA (advanced maternal age) multigravida 65+   . Anxiety   . Blood transfusion 1990's  . Depression    bipolar  . GERD (gastroesophageal reflux disease)   . Hyperlipidemia   . Iron deficiency anemia 1990's   negative work up as to etiology; required iron infusion, blood transfusion.  Saw an hematologist  at [facility].   . Migraine   . Newborn product of in vitro fertilization (IVF) pregnancy   . Pregnancy induced hypertension   . Preterm labor   . Sleep apnea   . Thrombocytosis ([place])   . Vaginal Pap smear, abnormal    Past Surgical History:  Procedure Laterality Date  . CESAREAN SECTION  2009  . COLONOSCOPY  06/2012   neg. with   . GYNECOLOGIC CRYOSURGERY    . NASAL SEPTUM SURGERY  1983  . TONSILLECTOMY  1966   Family History: family history includes Breast cancer in her paternal aunt; Colon cancer (age of onset: 78) in her paternal uncle; Colon cancer (age of onset: 40) in her paternal uncle; Hearing loss in her daughter; Prostate cancer in her father; Stroke in her maternal grandfather and maternal grandmother. Social History:  reports that she has never smoked. She has never used smokeless tobacco. She reports that she does not drink alcohol or use drugs.     Maternal Diabetes: No Genetic Screening: Normal Maternal Ultrasounds/Referrals: Normal Fetal Ultrasounds or other Referrals:  None Maternal Substance Abuse:  No Significant Maternal Medications:  None Significant Maternal Lab Results:  None Other Comments:  None  Review of Systems  All other systems  reviewed and are negative.  History   There were no vitals taken for this visit. Maternal Exam:  Abdomen: Fetal presentation: vertex     Physical Exam  Nursing note and vitals reviewed. Constitutional: She appears well-developed.  HENT:  Head: Normocephalic.  Eyes: Pupils are equal, round, and reactive to light.  Neck: Normal range of motion.  Cardiovascular: Normal rate and regular rhythm.   Respiratory: Effort normal.    Prenatal labs: ABO, Rh: A/Positive/-- (01/26 0000) Antibody: Negative (01/26 0000) Rubella: Immune (01/26 0000) RPR: Nonreactive (01/26 0000)  HBsAg: Negative (01/26 0000)  HIV: Non-reactive (01/26 0000)  GBS:     Assessment/Plan: IUP at term Previous Cesarean Section PIH  Repeat LTCS   Bonnie Hall L 03/08/2017, 7:22 AM

## 2017-03-09 ENCOUNTER — Encounter (HOSPITAL_COMMUNITY)
Admission: AD | Disposition: A | Discharge status: Home / Self Care | Payer: Self-pay | Source: Ambulatory Visit | Attending: Obstetrics and Gynecology

## 2017-03-09 ENCOUNTER — Inpatient Hospital Stay (HOSPITAL_COMMUNITY): Admitting: Certified Registered Nurse Anesthetist

## 2017-03-09 ENCOUNTER — Inpatient Hospital Stay (HOSPITAL_COMMUNITY)
Admission: AD | Admit: 2017-03-09 | Discharge: 2017-03-12 | DRG: 766 | Disposition: A | Discharge status: Home / Self Care | Source: Ambulatory Visit | Attending: Obstetrics and Gynecology | Admitting: Obstetrics and Gynecology

## 2017-03-09 ENCOUNTER — Encounter (HOSPITAL_COMMUNITY): Payer: Self-pay

## 2017-03-09 DIAGNOSIS — O134 Gestational [pregnancy-induced] hypertension without significant proteinuria, complicating childbirth: Secondary | ICD-10-CM | POA: Diagnosis present

## 2017-03-09 DIAGNOSIS — O34211 Maternal care for low transverse scar from previous cesarean delivery: Secondary | ICD-10-CM | POA: Diagnosis present

## 2017-03-09 DIAGNOSIS — Z3A37 37 weeks gestation of pregnancy: Secondary | ICD-10-CM

## 2017-03-09 DIAGNOSIS — Z98891 History of uterine scar from previous surgery: Secondary | ICD-10-CM

## 2017-03-09 HISTORY — DX: Unspecified asthma, uncomplicated: J45.909

## 2017-03-09 LAB — RPR: RPR Ser Ql: NONREACTIVE

## 2017-03-09 SURGERY — Surgical Case
Anesthesia: Epidural | Site: Abdomen | Wound class: Clean Contaminated

## 2017-03-09 MED ORDER — COCONUT OIL OIL: 1.0000 "application " | TOPICAL_OIL | Status: DC | PRN

## 2017-03-09 MED ORDER — OXYTOCIN 10 UNIT/ML IJ SOLN
INTRAMUSCULAR | Status: AC
Start: 2017-03-09 — End: 2017-03-09
  Filled 2017-03-09: qty 4

## 2017-03-09 MED ORDER — MEPERIDINE HCL 25 MG/ML IJ SOLN: 6.2500 mg | INTRAMUSCULAR | Status: DC | PRN

## 2017-03-09 MED ORDER — NALBUPHINE HCL 10 MG/ML IJ SOLN
5.0000 mg | Freq: Once | INTRAMUSCULAR | Status: DC | PRN
  Filled 2017-03-09: qty 0.5

## 2017-03-09 MED ORDER — OXYCODONE HCL 5 MG/5ML PO SOLN: 5.0000 mg | Freq: Once | ORAL | Status: DC | PRN

## 2017-03-09 MED ORDER — OXYCODONE HCL 5 MG PO TABS: 5.0000 mg | ORAL_TABLET | Freq: Once | ORAL | Status: DC | PRN

## 2017-03-09 MED ORDER — PHENYLEPHRINE 8 MG IN D5W 100 ML (0.08MG/ML) PREMIX OPTIME
INJECTION | INTRAVENOUS | Status: DC | PRN
  Administered 2017-03-09: 80 ug/min via INTRAVENOUS
  Administered 2017-03-09: 60 ug/min via INTRAVENOUS

## 2017-03-09 MED ORDER — SOD CITRATE-CITRIC ACID 500-334 MG/5ML PO SOLN
30.0000 mL | Freq: Once | ORAL | Status: AC
  Administered 2017-03-09: 30 mL via ORAL

## 2017-03-09 MED ORDER — LACTATED RINGERS IV SOLN
INTRAVENOUS | Status: DC
  Administered 2017-03-09 (×2): via INTRAVENOUS

## 2017-03-09 MED ORDER — DIPHENHYDRAMINE HCL 25 MG PO CAPS
25.0000 mg | ORAL_CAPSULE | ORAL | Status: DC | PRN
  Administered 2017-03-10: 25 mg via ORAL
  Filled 2017-03-09: qty 1

## 2017-03-09 MED ORDER — NALBUPHINE HCL 10 MG/ML IJ SOLN
5.0000 mg | INTRAMUSCULAR | Status: DC | PRN
Start: 2017-03-09 — End: 2017-03-12
  Administered 2017-03-09: 5 mg via INTRAVENOUS
  Filled 2017-03-09: qty 0.5
  Filled 2017-03-09: qty 1

## 2017-03-09 MED ORDER — NALBUPHINE HCL 10 MG/ML IJ SOLN
5.0000 mg | Freq: Once | INTRAMUSCULAR | Status: DC | PRN
Start: 2017-03-09 — End: 2017-03-12
  Filled 2017-03-09: qty 0.5

## 2017-03-09 MED ORDER — SODIUM CHLORIDE 0.9% FLUSH: 3.0000 mL | INTRAVENOUS | Status: DC | PRN

## 2017-03-09 MED ORDER — PHENYLEPHRINE 8 MG IN D5W 100 ML (0.08MG/ML) PREMIX OPTIME
INJECTION | INTRAVENOUS | Status: AC
Start: 2017-03-09 — End: 2017-03-09
  Filled 2017-03-09: qty 100

## 2017-03-09 MED ORDER — MONTELUKAST SODIUM 10 MG PO TABS
10.0000 mg | ORAL_TABLET | Freq: Every day | ORAL | Status: DC
  Administered 2017-03-09 – 2017-03-11 (×3): 10 mg via ORAL
  Filled 2017-03-09 (×4): qty 1

## 2017-03-09 MED ORDER — OXYCODONE-ACETAMINOPHEN 5-325 MG PO TABS
2.0000 | ORAL_TABLET | ORAL | Status: DC | PRN
  Administered 2017-03-12: 2 via ORAL
  Filled 2017-03-09: qty 2

## 2017-03-09 MED ORDER — SCOPOLAMINE 1 MG/3DAYS TD PT72
MEDICATED_PATCH | TRANSDERMAL | Status: AC
  Filled 2017-03-09: qty 1

## 2017-03-09 MED ORDER — BUPIVACAINE IN DEXTROSE 0.75-8.25 % IT SOLN
INTRATHECAL | Status: AC
  Filled 2017-03-09: qty 2

## 2017-03-09 MED ORDER — OXYTOCIN 40 UNITS IN LACTATED RINGERS INFUSION - SIMPLE MED: 2.5000 [IU]/h | INTRAVENOUS | Status: DC

## 2017-03-09 MED ORDER — CEFAZOLIN SODIUM-DEXTROSE 2-4 GM/100ML-% IV SOLN
2.0000 g | Freq: Once | INTRAVENOUS | Status: AC
  Administered 2017-03-09: 2 g via INTRAVENOUS
  Filled 2017-03-09: qty 100

## 2017-03-09 MED ORDER — PRENATAL MULTIVITAMIN CH
1.0000 | ORAL_TABLET | Freq: Every day | ORAL | Status: DC
  Administered 2017-03-10 – 2017-03-11 (×2): 1 via ORAL
  Filled 2017-03-09 (×2): qty 1

## 2017-03-09 MED ORDER — FENTANYL CITRATE (PF) 100 MCG/2ML IJ SOLN
INTRAMUSCULAR | Status: AC
  Filled 2017-03-09: qty 2

## 2017-03-09 MED ORDER — BUPIVACAINE IN DEXTROSE 0.75-8.25 % IT SOLN
INTRATHECAL | Status: DC | PRN
  Administered 2017-03-09: 1.4 mL via INTRATHECAL

## 2017-03-09 MED ORDER — FLUTICASONE PROPIONATE 50 MCG/ACT NA SUSP
2.0000 | Freq: Every day | NASAL | Status: DC
  Administered 2017-03-10 – 2017-03-11 (×2): 2 via NASAL
  Filled 2017-03-09: qty 16

## 2017-03-09 MED ORDER — MENTHOL 3 MG MT LOZG: 1.0000 | LOZENGE | OROMUCOSAL | Status: DC | PRN

## 2017-03-09 MED ORDER — NALOXONE HCL 0.4 MG/ML IJ SOLN: 0.4000 mg | INTRAMUSCULAR | Status: DC | PRN

## 2017-03-09 MED ORDER — LIDOCAINE HCL 1 % IJ SOLN
INTRAMUSCULAR | Status: AC
  Filled 2017-03-09: qty 10

## 2017-03-09 MED ORDER — OXYTOCIN 10 UNIT/ML IJ SOLN
INTRAMUSCULAR | Status: DC | PRN
  Administered 2017-03-09: 40 [IU] via INTRAVENOUS

## 2017-03-09 MED ORDER — KETOROLAC TROMETHAMINE 30 MG/ML IJ SOLN
30.0000 mg | Freq: Four times a day (QID) | INTRAMUSCULAR | Status: DC | PRN
Start: 2017-03-09 — End: 2017-03-09

## 2017-03-09 MED ORDER — LAMOTRIGINE 150 MG PO TABS
150.0000 mg | ORAL_TABLET | Freq: Every day | ORAL | Status: DC
  Administered 2017-03-09 – 2017-03-11 (×3): 150 mg via ORAL
  Filled 2017-03-09 (×4): qty 1

## 2017-03-09 MED ORDER — SCOPOLAMINE 1 MG/3DAYS TD PT72: 1.0000 | MEDICATED_PATCH | Freq: Once | TRANSDERMAL | Status: DC

## 2017-03-09 MED ORDER — SIMETHICONE 80 MG PO CHEW
80.0000 mg | CHEWABLE_TABLET | ORAL | Status: DC
  Administered 2017-03-09 – 2017-03-11 (×3): 80 mg via ORAL
  Filled 2017-03-09 (×3): qty 1

## 2017-03-09 MED ORDER — PHENYLEPHRINE HCL 10 MG/ML IJ SOLN
INTRAMUSCULAR | Status: DC | PRN
  Administered 2017-03-09: 40 ug via INTRAVENOUS

## 2017-03-09 MED ORDER — OXYTOCIN 10 UNIT/ML IJ SOLN
INTRAMUSCULAR | Status: AC
  Filled 2017-03-09: qty 4

## 2017-03-09 MED ORDER — SCOPOLAMINE 1 MG/3DAYS TD PT72
MEDICATED_PATCH | TRANSDERMAL | Status: DC | PRN
  Administered 2017-03-09: 1 via TRANSDERMAL

## 2017-03-09 MED ORDER — LABETALOL HCL 100 MG PO TABS
100.0000 mg | ORAL_TABLET | Freq: Two times a day (BID) | ORAL | Status: DC
  Administered 2017-03-09 – 2017-03-11 (×5): 100 mg via ORAL
  Filled 2017-03-09 (×5): qty 1

## 2017-03-09 MED ORDER — DIPHENHYDRAMINE HCL 50 MG/ML IJ SOLN: 12.5000 mg | INTRAMUSCULAR | Status: DC | PRN

## 2017-03-09 MED ORDER — ALBUTEROL SULFATE (2.5 MG/3ML) 0.083% IN NEBU
3.0000 mL | INHALATION_SOLUTION | RESPIRATORY_TRACT | Status: DC | PRN
Start: 2017-03-09 — End: 2017-03-12

## 2017-03-09 MED ORDER — OXYCODONE-ACETAMINOPHEN 5-325 MG PO TABS
1.0000 | ORAL_TABLET | ORAL | Status: DC | PRN
  Administered 2017-03-10 – 2017-03-12 (×9): 1 via ORAL
  Filled 2017-03-09 (×9): qty 1

## 2017-03-09 MED ORDER — SODIUM CHLORIDE 0.9 % IR SOLN
Status: DC | PRN
  Administered 2017-03-09: 1000 mL

## 2017-03-09 MED ORDER — TETANUS-DIPHTH-ACELL PERTUSSIS 5-2.5-18.5 LF-MCG/0.5 IM SUSP: 0.5000 mL | Freq: Once | INTRAMUSCULAR | Status: DC

## 2017-03-09 MED ORDER — KETOROLAC TROMETHAMINE 30 MG/ML IJ SOLN
INTRAMUSCULAR | Status: AC
  Filled 2017-03-09: qty 1

## 2017-03-09 MED ORDER — HYDROMORPHONE HCL 1 MG/ML IJ SOLN: 0.2500 mg | INTRAMUSCULAR | Status: DC | PRN

## 2017-03-09 MED ORDER — IBUPROFEN 600 MG PO TABS
600.0000 mg | ORAL_TABLET | Freq: Four times a day (QID) | ORAL | Status: DC
  Administered 2017-03-09 – 2017-03-12 (×11): 600 mg via ORAL
  Filled 2017-03-09 (×10): qty 1

## 2017-03-09 MED ORDER — DIPHENHYDRAMINE HCL 25 MG PO CAPS: 25.0000 mg | ORAL_CAPSULE | Freq: Four times a day (QID) | ORAL | Status: DC | PRN

## 2017-03-09 MED ORDER — LURASIDONE HCL 20 MG PO TABS
10.0000 mg | ORAL_TABLET | Freq: Every day | ORAL | Status: DC
  Administered 2017-03-09 – 2017-03-11 (×3): 10 mg via ORAL
  Filled 2017-03-09 (×4): qty 1

## 2017-03-09 MED ORDER — SENNOSIDES-DOCUSATE SODIUM 8.6-50 MG PO TABS
2.0000 | ORAL_TABLET | ORAL | Status: DC
  Administered 2017-03-09 – 2017-03-11 (×3): 2 via ORAL
  Filled 2017-03-09 (×3): qty 2

## 2017-03-09 MED ORDER — SIMETHICONE 80 MG PO CHEW: 80.0000 mg | CHEWABLE_TABLET | ORAL | Status: DC | PRN

## 2017-03-09 MED ORDER — DIBUCAINE 1 % RE OINT: 1.0000 "application " | TOPICAL_OINTMENT | RECTAL | Status: DC | PRN

## 2017-03-09 MED ORDER — LACTATED RINGERS IV SOLN
INTRAVENOUS | Status: DC | PRN
  Administered 2017-03-09: 14:00:00 via INTRAVENOUS

## 2017-03-09 MED ORDER — MORPHINE SULFATE (PF) 0.5 MG/ML IJ SOLN
INTRAMUSCULAR | Status: DC | PRN
  Administered 2017-03-09: .2 mg via INTRATHECAL

## 2017-03-09 MED ORDER — ONDANSETRON HCL 4 MG/2ML IJ SOLN
INTRAMUSCULAR | Status: AC
  Filled 2017-03-09: qty 2

## 2017-03-09 MED ORDER — SIMETHICONE 80 MG PO CHEW
80.0000 mg | CHEWABLE_TABLET | Freq: Three times a day (TID) | ORAL | Status: DC
  Administered 2017-03-10 – 2017-03-12 (×7): 80 mg via ORAL
  Filled 2017-03-09 (×7): qty 1

## 2017-03-09 MED ORDER — ONDANSETRON HCL 4 MG/2ML IJ SOLN
INTRAMUSCULAR | Status: DC | PRN
  Administered 2017-03-09: 4 mg via INTRAVENOUS

## 2017-03-09 MED ORDER — LACTATED RINGERS IV SOLN
INTRAVENOUS | Status: DC
  Administered 2017-03-09 – 2017-03-10 (×2): via INTRAVENOUS

## 2017-03-09 MED ORDER — MORPHINE SULFATE (PF) 0.5 MG/ML IJ SOLN
INTRAMUSCULAR | Status: AC
  Filled 2017-03-09: qty 10

## 2017-03-09 MED ORDER — FENTANYL CITRATE (PF) 100 MCG/2ML IJ SOLN
INTRAMUSCULAR | Status: DC | PRN
  Administered 2017-03-09: 10 ug via INTRATHECAL

## 2017-03-09 MED ORDER — ACETAMINOPHEN 325 MG PO TABS: 650.0000 mg | ORAL_TABLET | ORAL | Status: DC | PRN

## 2017-03-09 MED ORDER — NALOXONE HCL 2 MG/2ML IJ SOSY: 1.0000 ug/kg/h | PREFILLED_SYRINGE | INTRAVENOUS | Status: DC | PRN

## 2017-03-09 MED ORDER — PROMETHAZINE HCL 25 MG/ML IJ SOLN: 6.2500 mg | INTRAMUSCULAR | Status: DC | PRN

## 2017-03-09 MED ORDER — PHENYLEPHRINE 8 MG IN D5W 100 ML (0.08MG/ML) PREMIX OPTIME
INJECTION | INTRAVENOUS | Status: AC
  Filled 2017-03-09: qty 100

## 2017-03-09 MED ORDER — ZOLPIDEM TARTRATE 5 MG PO TABS: 5.0000 mg | ORAL_TABLET | Freq: Every evening | ORAL | Status: DC | PRN

## 2017-03-09 MED ORDER — NALBUPHINE HCL 10 MG/ML IJ SOLN
5.0000 mg | INTRAMUSCULAR | Status: DC | PRN
Start: 2017-03-09 — End: 2017-03-12
  Filled 2017-03-09: qty 0.5

## 2017-03-09 MED ORDER — ONDANSETRON HCL 4 MG/2ML IJ SOLN: 4.0000 mg | Freq: Three times a day (TID) | INTRAMUSCULAR | Status: DC | PRN

## 2017-03-09 MED ORDER — PANTOPRAZOLE SODIUM 40 MG PO TBEC
80.0000 mg | DELAYED_RELEASE_TABLET | Freq: Every day | ORAL | Status: DC
  Administered 2017-03-10 – 2017-03-12 (×3): 80 mg via ORAL
  Filled 2017-03-09 (×3): qty 2

## 2017-03-09 MED ORDER — LACTATED RINGERS IV SOLN: INTRAVENOUS | Status: DC

## 2017-03-09 MED ORDER — WITCH HAZEL-GLYCERIN EX PADS: 1.0000 "application " | MEDICATED_PAD | CUTANEOUS | Status: DC | PRN

## 2017-03-09 MED ORDER — KETOROLAC TROMETHAMINE 30 MG/ML IJ SOLN
30.0000 mg | Freq: Four times a day (QID) | INTRAMUSCULAR | Status: DC | PRN
  Administered 2017-03-09: 30 mg via INTRAMUSCULAR

## 2017-03-09 MED ORDER — SOD CITRATE-CITRIC ACID 500-334 MG/5ML PO SOLN
ORAL | Status: AC
  Filled 2017-03-09: qty 15

## 2017-03-09 SURGICAL SUPPLY — 29 items
APL SKNCLS STERI-STRIP NONHPOA (GAUZE/BANDAGES/DRESSINGS) ×1
BARRIER ADHS 3X4 INTERCEED (GAUZE/BANDAGES/DRESSINGS) IMPLANT
BENZOIN TINCTURE PRP APPL 2/3 (GAUZE/BANDAGES/DRESSINGS) ×1 IMPLANT
BRR ADH 4X3 ABS CNTRL BYND (GAUZE/BANDAGES/DRESSINGS)
CHLORAPREP W/TINT 26ML (MISCELLANEOUS) ×2 IMPLANT
CLAMP CORD UMBIL (MISCELLANEOUS) ×2 IMPLANT
CLOTH BEACON ORANGE TIMEOUT ST (SAFETY) ×2 IMPLANT
DRSG OPSITE POSTOP 4X10 (GAUZE/BANDAGES/DRESSINGS) ×2 IMPLANT
ELECT REM PT RETURN 9FT ADLT (ELECTROSURGICAL) ×2
ELECTRODE REM PT RTRN 9FT ADLT (ELECTROSURGICAL) ×1 IMPLANT
EXTRACTOR VACUUM M CUP 4 TUBE (SUCTIONS) ×1 IMPLANT
GLOVE BIO SURGEON STRL SZ 6.5 (GLOVE) ×2 IMPLANT
GLOVE BIOGEL PI IND STRL 7.0 (GLOVE) ×1 IMPLANT
GLOVE BIOGEL PI INDICATOR 7.0 (GLOVE) ×1
GOWN STRL REUS W/TWL LRG LVL3 (GOWN DISPOSABLE) ×4 IMPLANT
NDL HYPO 25X5/8 SAFETYGLIDE (NEEDLE) ×1 IMPLANT
NEEDLE HYPO 25X5/8 SAFETYGLIDE (NEEDLE) ×2 IMPLANT
NS IRRIG 1000ML POUR BTL (IV SOLUTION) ×2 IMPLANT
PACK C SECTION WH (CUSTOM PROCEDURE TRAY) ×2 IMPLANT
PAD OB MATERNITY 4.3X12.25 (PERSONAL CARE ITEMS) ×2 IMPLANT
PENCIL SMOKE EVAC W/HOLSTER (ELECTROSURGICAL) ×2 IMPLANT
STRIP CLOSURE SKIN 1/2X4 (GAUZE/BANDAGES/DRESSINGS) ×1 IMPLANT
SUT CHROMIC 0 CTX 36 (SUTURE) ×4 IMPLANT
SUT PLAIN 2 0 XLH (SUTURE) ×1 IMPLANT
SUT VIC AB 0 CT1 27 (SUTURE) ×6
SUT VIC AB 0 CT1 27XBRD ANBCTR (SUTURE) ×3 IMPLANT
SUT VIC AB 4-0 KS 27 (SUTURE) ×1 IMPLANT
TOWEL OR 17X24 6PK STRL BLUE (TOWEL DISPOSABLE) ×2 IMPLANT
TRAY FOLEY BAG SILVER LF 14FR (SET/KITS/TRAYS/PACK) ×2 IMPLANT

## 2017-03-09 NOTE — Lactation Note (Signed)
This note was copied from a baby's chart. Lactation Consultation Note  Patient Name: Bonnie Hall KYHCW'C Date: 03/09/2017 Reason for consult: Initial assessment;Other (Comment) (Early Term Infant)   Initial consult with mom of < 1 hour old infant in PACU. Mom BF her 55 yo twins for 5-6 months (daughter) and 1 year (son). She reports they were supplemented as NICU infants. They were in the NICU at [facility] for 6 weeks. Mom reports all 3 children were IVF. Mom report + breast changes with pregnancy with a lot of growth.   Mom is on several Medications-Called and spoke with Dr. Volney American earlier today in regards to mom's medications and breastfeeding. Medications of concern are Trintellix (L3 No Data -probably compatible), Latuda (L3 No Data-probably compatible), Lamictal (L2-Significant Data-Compatible), Xanax (L3- Limited Data-Probably Compatible. Dr. Volney American would like [place] to present information to mother. She agreed to allow mom to BF in the early post partum period to be reevaluated as needed. Nursery was informed of Dr. Iran Ouch recommendation.  Information from [employer] Medication and Mother's Milk shared with mom- copies of medication information was given to mom. Mom report she researched medication along with her Psychologist prior to delivery. She reports her Psychologist is very supportive of her breast feeding her infant. She indicated she did talk with her doctor about discontinuing some of the meds, it was thought not to be in the best interest of the mother to do so.   Infant was STS with MOB when [place] arrived. Mom unable to assist much with feeding due to shakiness. Assisted mom in latching infant to left breast, infant not willing to latch. Then repositioned infant to right breast and infant latched easily. He fed for about 10 minutes with multiple swallows and gulping noted. He stopped BF after 10 minutes and was not willing to relatch. He was left STS with mom. He was noted to have  intermittent grunting with feeding with no increased work of breathing and infant pink in color throughout feeding. Grunting increased after feeding while on mom's chest, Everlean Alstrom, RN notified of increased grunting and went back to PACU to assess infant.    Mom with soft compressible breasts and areola with everted nipples. Colostrum in large quantities with hand expression. Mom will need to be taught hand expression at a later time.   LPT infant feeding policy shared with mom since infant 10w 1d. Enc mom to feed infant 8-12 x in 24 hours at first feeding cues with no longer than 3 hours between feeds. Enc mom to hand express before and after feeding and offer infant any EBM with spoon (mom will need to be taught spoon feeding). Plan is to supplement with EBM that is available at this time unless other supplement is medically indicated. Reviewed decreasing stimulation to infant, keeping his hat on at all times, and keeping him STS as much as possible. Enc mom to call out for feeding assistance at all times. Discussed with mom that if infant gets sleepy and not feeding well, it would be recommended that she begin pumping. Mom voiced understanding.   Mom is requesting a single user DEBP pump, her insurance company told her to get it from the hospital. Will need to follow up before d/c. Mom does not have a pump at home.   BF Resources handout and [place] Brochure given, mom informed of IP/OP Services, BF Support Groups and [place] phone #.     Mom without further questions/concerns at this time.  Maternal Data Formula Feeding for Exclusion: No Has patient been taught Hand Expression?: No Does the patient have breastfeeding experience prior to this delivery?: Yes  Feeding Feeding Type: Breast Fed Length of feed: 10 min  LATCH Score/Interventions Latch: Grasps breast easily, tongue down, lips flanged, rhythmical sucking.  Audible Swallowing: Spontaneous and intermittent  Type of Nipple:  Everted at rest and after stimulation  Comfort (Breast/Nipple): Soft / non-tender     Hold (Positioning): Full assist, staff holds infant at breast Intervention(s): Breastfeeding basics reviewed;Support Pillows;Position options;Skin to skin  LATCH Score: 8  Lactation Tools Discussed/Used WIC Program: No   Consult Status Consult Status: Follow-up Date: 03/10/17 Follow-up type: In-patient    Debby Freiberg Aven Christen 03/09/2017, 3:10 PM

## 2017-03-09 NOTE — Lactation Note (Signed)
Lactation Consultation Note  Patient Name: [name] Date: 03/09/2017   Called and spoke with Dr. Volney American in regards to mom's medications and breastfeeding. Medications of concern are Trintellix (L3 No Data -probably compatible), Latuda (L3 No Data-probably compatible), Lamictal (L2-Significant Data-Compatible), Xanax (L3- Limited Data-Probably Compatible. Dr. Volney American would like [place] to present information to mother. She agreed to allow mom to BF in the early post partum period to be reevaluated as needed. Report to Carolann Littler, RN in CN.      Maternal Data    Feeding    LATCH Score/Interventions                      Lactation Tools Discussed/Used     Consult Status      Donn Pierini 03/09/2017, 10:43 AM

## 2017-03-09 NOTE — Anesthesia Preprocedure Evaluation (Addendum)
Anesthesia Evaluation  Patient identified by MRN, date of birth, ID band Patient awake    Reviewed: Allergy & Precautions, NPO status , Patient's Chart, lab work & pertinent test results  Airway Mallampati: II  TM Distance: >3 FB Neck ROM: Full    Dental no notable dental hx.    Pulmonary neg pulmonary ROS, asthma , sleep apnea ,    Pulmonary exam normal breath sounds clear to auscultation       Cardiovascular hypertension, Pt. on medications negative cardio ROS Normal cardiovascular exam Rhythm:Regular Rate:Normal     Neuro/Psych  Headaches, negative neurological ROS  negative psych ROS   GI/Hepatic negative GI ROS, Neg liver ROS, GERD  ,  Endo/Other  negative endocrine ROS  Renal/GU negative Renal ROS  negative genitourinary   Musculoskeletal negative musculoskeletal ROS (+)   Abdominal   Peds negative pediatric ROS (+)  Hematology negative hematology ROS (+)   Anesthesia Other Findings   Reproductive/Obstetrics negative OB ROS (+) Pregnancy                             Anesthesia Physical Anesthesia Plan  ASA: III  Anesthesia Plan: Spinal   Post-op Pain Management:    Induction:   PONV Risk Score and Plan: 4 or greater and Ondansetron and Treatment may vary due to age or medical condition  Airway Management Planned: Natural Airway  Additional Equipment:   Intra-op Plan:   Post-operative Plan:   Informed Consent: I have reviewed the patients History and Physical, chart, labs and discussed the procedure including the risks, benefits and alternatives for the proposed anesthesia with the patient or authorized representative who has indicated his/her understanding and acceptance.   Dental advisory given  Plan Discussed with: CRNA  Anesthesia Plan Comments:        Anesthesia Quick Evaluation

## 2017-03-09 NOTE — Op Note (Signed)
NAME:  Bonnie Hall, Bonnie Hall           ACCOUNT NO.:  [id number]  MEDICAL RECORD NO.:  [id number]  LOCATION:                                 FACILITY:  PHYSICIAN:  Tallula Dinari Stgermaine, M.D.DATE OF BIRTH:  [birth year]  DATE OF PROCEDURE:  03/09/2017 DATE OF DISCHARGE:                              OPERATIVE REPORT   PREOPERATIVE DIAGNOSIS:  Intrauterine pregnancy at 37 weeks 1 day, previous cesarean section, __________ hypertension.  POSTOPERATIVE DIAGNOSIS:  Intrauterine pregnancy at 37 weeks 1 day, previous cesarean section, __________ hypertension.  PROCEDURE:  Repeat low transverse cesarean section.  SURGEON:  Chinyere Galiano L. Helane Rima, M.D.  ASSISTANT:  __________.  ESTIMATED BLOOD LOSS:  500 mL.  ANESTHESIA:  Spinal.  PROCEDURE DESCRIPTION:  The patient was taken to the operating room. Anesthesia was administered.  She was prepped and draped. Time-out was performed. Foley catheter had been inserted.  A low transverse incision was made, carried down to the fascia.  Fascia scored in the midline, extended laterally.  The rectus muscles were divided in the midline. The peritoneum was entered bluntly, and the peritoneal incision was stretched. The lower uterine segment was identified.  The bladder flap was created.  A low-transverse incision was made in the uterus. Amniotic fluid was clear.  The baby was in cephalic presentation __________ Apgars were 9 at 1 minute and 9 at 5 minutes.  The cord was clamped and cut.  The baby was handed to awaiting neonatal team. Placenta was manually removed, noted to be normal intact with a 3-vessel cord.  The uterine cavity was cleared of all clots and debris.  The uterus was closed with 0 chromic in a running stitch.  The uterus was returned to the abdomen.  After irrigation was performed, the peritoneum was closed using 0 Vicryl. The fascia was closed using 0 Vicryl starting each corner and meeting in the midline.  The subcu stitch was in place in  running fashion and skin was closed with 4-0 Vicryl on a Keith needle.  All sponge, lap, and instrument counts were correct x2.  The patient went to recovery room in stable condition.    Casaundra Takacs L. Helane Rima, M.D.    Nevin Bloodgood  D:  03/09/2017  T:  03/09/2017  Job:  [id number]

## 2017-03-09 NOTE — Anesthesia Postprocedure Evaluation (Signed)
Anesthesia Post Note  Patient: ADILENNE ASHWORTH  Procedure(s) Performed: Procedure(s) (LRB): REPEAT CESAREAN SECTION (N/A)     Patient location during evaluation: PACU Anesthesia Type: Epidural Level of consciousness: oriented and awake and alert Pain management: pain level controlled Vital Signs Assessment: post-procedure vital signs reviewed and stable Respiratory status: spontaneous breathing and respiratory function stable Cardiovascular status: blood pressure returned to baseline and stable Postop Assessment: no headache and no backache Anesthetic complications: no    Last Vitals:  Vitals:   03/09/17 1515 03/09/17 1527  BP: (!) 131/93 (!) 131/93  Pulse: 73 72  Resp: (!) 21   Temp: 36.8 C     Last Pain:  Vitals:   03/09/17 1515  TempSrc: Oral  PainSc: 2    Pain Goal:                 Lynda Rainwater

## 2017-03-09 NOTE — Lactation Note (Signed)
This note was copied from a baby's chart. Lactation Consultation Note  Patient Name: Bonnie Hall ACZYS'A Date: 03/09/2017 Reason for consult: Follow-up assessment   Follow up with mom of 3 hour old infant. Infant STS with mom and initially asleep. He is noted to continue to have grunting without distress. Showed mom how to hand express and obtained 4 cc colostrum easily. Infant was spoon fed 1 ml colostrum. Diaper was changed and then he began cueing to feed. He was latched to left breast in the side lying position and fed for 15 minutes with audible swallows noted. Infant finished feeding and was left STS with mom. Remainder of colostrum at bedside to feed to infant after next feeding. Mom to call out for feeding assistance as needed.   Maternal Data Formula Feeding for Exclusion: No Has patient been taught Hand Expression?: Yes Does the patient have breastfeeding experience prior to this delivery?: Yes  Feeding Feeding Type: Breast Fed Length of feed: 15 min  LATCH Score/Interventions Latch: Repeated attempts needed to sustain latch, nipple held in mouth throughout feeding, stimulation needed to elicit sucking reflex. Intervention(s): Adjust position;Assist with latch;Breast massage;Breast compression  Audible Swallowing: Spontaneous and intermittent  Type of Nipple: Everted at rest and after stimulation  Comfort (Breast/Nipple): Soft / non-tender     Hold (Positioning): Assistance needed to correctly position infant at breast and maintain latch. Intervention(s): Breastfeeding basics reviewed;Support Pillows;Position options;Skin to skin  LATCH Score: 8  Lactation Tools Discussed/Used WIC Program: No   Consult Status Consult Status: Follow-up Date: 03/10/17 Follow-up type: In-patient    Debby Freiberg Bertrum Helmstetter 03/09/2017, 5:41 PM

## 2017-03-09 NOTE — Brief Op Note (Signed)
03/09/2017  2:04 PM  PATIENT:  Bonnie Hall  56 y.o. female  PRE-OPERATIVE DIAGNOSIS:   IUP at 22 w 1 day Previous Cesarean Section Gestational hypertension  POST-OPERATIVE DIAGNOSIS: Same  PROCEDURE:  Procedure(s): REPEAT CESAREAN SECTION (N/A)  SURGEON:  Surgeon(s) and Role:    * Dian Queen, MD - Primary  PHYSICIAN ASSISTANT:   ASSISTANTS: none   ANESTHESIA:   spinal  EBL:  Total I/O In: 2800 [I.V.:2800] Out: 700 [Urine:100; Blood:600]  BLOOD ADMINISTERED:none  DRAINS: Urinary Catheter (Foley)   LOCAL MEDICATIONS USED:  NONE  SPECIMEN:  No Specimen  DISPOSITION OF SPECIMEN:  N/A  COUNTS:  YES  TOURNIQUET:  * No tourniquets in log *  DICTATION: .Other Dictation: Dictation Number [id number]  PLAN OF CARE: Admit to inpatient   PATIENT DISPOSITION:  PACU - hemodynamically stable.   Delay start of Pharmacological VTE agent (>24hrs) due to surgical blood loss or risk of bleeding: not applicable

## 2017-03-09 NOTE — Transfer of Care (Signed)
Immediate Anesthesia Transfer of Care Note  Patient: Bonnie Hall  Procedure(s) Performed: Procedure(s): REPEAT CESAREAN SECTION (N/A)  Patient Location: PACU  Anesthesia Type:Spinal  Level of Consciousness: awake, alert  and oriented  Airway & Oxygen Therapy: Patient Spontanous Breathing  Post-op Assessment: Report given to RN and Post -op Vital signs reviewed and stable  Post vital signs: Reviewed and stable  Last Vitals:  Vitals:   03/09/17 1138  BP: 126/83  Pulse: 97  Resp: 16  Temp: 37.5 C    Last Pain:  Vitals:   03/09/17 1138  TempSrc: Oral         Complications: No apparent anesthesia complications

## 2017-03-09 NOTE — Progress Notes (Signed)
H and P on the chart Proceed with Repeat LTCS Consent signed

## 2017-03-09 NOTE — Anesthesia Procedure Notes (Signed)
Spinal  Patient location during procedure: OB Start time: 03/09/2017 1:16 PM End time: 03/09/2017 1:23 PM Staffing Anesthesiologist: Candida Peeling RAY Performed: anesthesiologist  Preanesthetic Checklist Completed: patient identified, surgical consent, pre-op evaluation, timeout performed, IV checked, risks and benefits discussed and monitors and equipment checked Spinal Block Patient position: sitting Prep: Betadine and site prepped and draped Patient monitoring: heart rate, cardiac monitor, continuous pulse ox and blood pressure Approach: midline Location: L3-4 Injection technique: single-shot Needle Needle type: Pencan  Needle gauge: 24 G Needle length: 10 cm Assessment Sensory level: T4

## 2017-03-09 NOTE — Progress Notes (Signed)
Dr Corinna Capra aware of BP 147/79 New BP perimeters given at this time.

## 2017-03-10 LAB — CBC
HCT: 33.5 % — ABNORMAL LOW (ref 36.0–46.0)
Hemoglobin: 11 g/dL — ABNORMAL LOW (ref 12.0–15.0)
MCH: 31.3 pg (ref 26.0–34.0)
MCHC: 32.8 g/dL (ref 30.0–36.0)
MCV: 95.4 fL (ref 78.0–100.0)
Platelets: 249 10*3/uL (ref 150–400)
RBC: 3.51 MIL/uL — ABNORMAL LOW (ref 3.87–5.11)
RDW: 15 % (ref 11.5–15.5)
WBC: 11.4 10*3/uL — ABNORMAL HIGH (ref 4.0–10.5)

## 2017-03-10 LAB — BIRTH TISSUE RECOVERY COLLECTION (PLACENTA DONATION)

## 2017-03-10 MED ORDER — ONDANSETRON HCL 4 MG PO TABS
8.0000 mg | ORAL_TABLET | Freq: Three times a day (TID) | ORAL | Status: DC | PRN
  Administered 2017-03-10: 8 mg via ORAL
  Filled 2017-03-10: qty 2

## 2017-03-10 NOTE — Lactation Note (Signed)
This note was copied from a baby's chart. Lactation Consultation Note  Patient Name: Bonnie Hall UXYBF'X Date: 03/10/2017 Reason for consult: Follow-up assessment;Other (Comment) (Early Term infant)   Follow up with mom of 30 hour old infant. Infant with 6 BF for 10-25 minutes, 8 voids and 4 stools since birth. Infant weight 7 lb 2.5 oz with 3% weight loss since birth. LATCH scores 8.   Infant currently out having hearing screen and had circumcision this morning. Mom reports infant is feeding well. [place] phone # left and mom to call for next feeding for feeding assessment.   Mom is calling insurance company to inquire where to purchase pump for home use. Mom without questions/concerns at this time.    Maternal Data Formula Feeding for Exclusion: No Has patient been taught Hand Expression?: Yes Does the patient have breastfeeding experience prior to this delivery?: Yes  Feeding Feeding Type: Breast Fed Length of feed: 15 min  LATCH Score/Interventions                      Lactation Tools Discussed/Used     Consult Status Consult Status: Follow-up Date: 03/10/17 Follow-up type: In-patient    Debby Freiberg Mahiya Kercheval 03/10/2017, 10:41 AM

## 2017-03-10 NOTE — Progress Notes (Signed)
Subjective: Postpartum Day 1: Cesarean Delivery Patient reports tolerating PO.   Denies HA, blurred vision or RUQ pain Objective: Vital signs in last 24 hours: Temp:  [98 F (36.7 C)-99.5 F (37.5 C)] 98.1 F (36.7 C) (06/28 0540) Pulse Rate:  [65-113] 65 (06/28 0540) Resp:  [16-26] 18 (06/28 0540) BP: (126-165)/(63-93) 151/84 (06/28 0540) SpO2:  [93 %-98 %] 94 % (06/28 0540) Weight:  [207 lb (93.9 kg)] 207 lb (93.9 kg) (06/27 1134)  Physical Exam:  General: alert and cooperative Lochia: appropriate Uterine Fundus: firm Incision: healing well DVT Evaluation: No evidence of DVT seen on physical exam. Negative Homan's sign. No cords or calf tenderness. Calf/Ankle edema is present.   Recent Labs  03/08/17 1015 03/10/17 0525  HGB 12.4 11.0*  HCT 38.2 33.5*    Assessment/Plan: Status post Cesarean section. Doing well postoperatively.  Continue current care.  Naryiah Schley G 03/10/2017, 8:32 AM

## 2017-03-10 NOTE — Progress Notes (Signed)
Notified Juanda Chance NP patient requests medication for nausea, see new orders.

## 2017-03-10 NOTE — Progress Notes (Signed)
Patient doing well. No complaints.  BP (!) 151/84 (BP Location: Right Arm)   Pulse 65   Temp 98.1 F (36.7 C) (Oral)   Resp 18   Ht 5\' 6"  (1.676 m)   Wt 93.9 kg (207 lb)   SpO2 94%   Breastfeeding? Unknown   BMI 33.41 kg/m  Results for orders placed or performed during the hospital encounter of 03/09/17 (from the past 24 hour(s))  CBC     Status: Abnormal   Collection Time: 03/10/17  5:25 AM  Result Value Ref Range   WBC 11.4 (H) 4.0 - 10.5 K/uL   RBC 3.51 (L) 3.87 - 5.11 MIL/uL   Hemoglobin 11.0 (L) 12.0 - 15.0 g/dL   HCT 33.5 (L) 36.0 - 46.0 %   MCV 95.4 78.0 - 100.0 fL   MCH 31.3 26.0 - 34.0 pg   MCHC 32.8 30.0 - 36.0 g/dL   RDW 15.0 11.5 - 15.5 %   Platelets 249 150 - 400 K/uL  Collect bld for placenta donatation     Status: None   Collection Time: 03/10/17  5:25 AM  Result Value Ref Range   Placenta donation bld collect COLLECTED BY LABORATORY    Abdomen Is soft and non tender  PPD #! Doing well Circ done Routine care

## 2017-03-11 NOTE — Progress Notes (Signed)
CSW received consult for hx of Anxiety and Bipolar Disorder.  CSW met with MOB and FOB to offer support and complete assessment.   Parents were extremely pleasant and receptive to CSW's visit.  FOB was quiet, but engaged and MOB was talkative.  MOB reports that her delivery went smooth and as planned.  She reports that this pregnancy was very difficult emotionally and physically and much harder than her first pregnancy 8 years ago.  MOB was very open about her mental health diagnoses and states she was taken off her "ideal medication-Abilify" and switched to [place] in pregnancy.  She takes Xanax and Trintellix.  She feels she coped well overall, but described herself as "irritable" throughout pregnancy.  She is well linked with mental health treatment and states she sees her Psychiatrist, Dr. Pauline Good approximately once every 2 months and meets with her counselor, Doroteo Glassman every other week.  She plans to continue this.  Parents were engaged and attentive as CSW provided education regarding PMADs and encouraged MOB to reach out to her providers if she has concerns at any time.  MOB completely agrees and acknowledges that emotions are heightened during the postpartum time period.  She states she feels she did well after her first pregnancy, but felt depressed initially.  She reports that her twins were in the NICU for 6 weeks and looking back on it, sees how beneficial this time was in learning how to care for her babies and having them be the same schedule as soon as they arrived home.  CSW normalized and validated high emotion during an experience like this and provided information about [facility] as well.  MOB seemed very appreciative of the discussion, for CSW's concern for her emotional wellbeing, and for validation of feelings.   MOB reports that their closest family is in [place], but that she has a great support group of friends locally.  She states people have already sent meals for them when  they get home.  She states her twins' godparents are caring for them while she and FOB are in the hospital.   CSW provided information about support groups held at [facility] and recommends self-evaluation during the postpartum time period using the New Mom Checklist from Postpartum Progress.  CSW identifies no further need for intervention and no barriers to discharge at this time.

## 2017-03-11 NOTE — Progress Notes (Signed)
Patient instructed and given abdominal binder.

## 2017-03-11 NOTE — Progress Notes (Signed)
Subjective: Postpartum Day 2: Cesarean Delivery Patient reports incisional pain, tolerating PO, + flatus and no problems voiding.   Reports frontal HA in the night , relieved with Advil, denies blurred vision or RUQ pain Objective: Vital signs in last 24 hours: Temp:  [97.3 F (36.3 C)-98.7 F (37.1 C)] 97.3 F (36.3 C) (06/29 0520) Pulse Rate:  [57-72] 57 (06/29 0520) Resp:  [18-19] 18 (06/29 0520) BP: (119-151)/(63-89) 148/89 (06/29 0520) SpO2:  [94 %-95 %] 94 % (06/28 1337)  Physical Exam:  General: cooperative and fatigued Lochia: appropriate Uterine Fundus: firm Incision: healing well DVT Evaluation: No evidence of DVT seen on physical exam. Calf/Ankle edema is present. DTR's 2+ no clonus   Recent Labs  03/08/17 1015 03/10/17 0525  HGB 12.4 11.0*  HCT 38.2 33.5*    Assessment/Plan: Status post Cesarean section. Postoperative course complicated by [facility]  Continue current care.  CURTIS,CAROL G 03/11/2017, 8:25 AM

## 2017-03-12 LAB — CBC
HCT: 32.4 % — ABNORMAL LOW (ref 36.0–46.0)
Hemoglobin: 10.7 g/dL — ABNORMAL LOW (ref 12.0–15.0)
MCH: 31.5 pg (ref 26.0–34.0)
MCHC: 33 g/dL (ref 30.0–36.0)
MCV: 95.3 fL (ref 78.0–100.0)
Platelets: 316 10*3/uL (ref 150–400)
RBC: 3.4 MIL/uL — ABNORMAL LOW (ref 3.87–5.11)
RDW: 15.3 % (ref 11.5–15.5)
WBC: 12 10*3/uL — ABNORMAL HIGH (ref 4.0–10.5)

## 2017-03-12 LAB — COMPREHENSIVE METABOLIC PANEL
ALT: 17 U/L (ref 14–54)
AST: 26 U/L (ref 15–41)
Albumin: 2.2 g/dL — ABNORMAL LOW (ref 3.5–5.0)
Alkaline Phosphatase: 107 U/L (ref 38–126)
Anion gap: 5 (ref 5–15)
BUN: 20 mg/dL (ref 6–20)
CO2: 26 mmol/L (ref 22–32)
Calcium: 8.1 mg/dL — ABNORMAL LOW (ref 8.9–10.3)
Chloride: 103 mmol/L (ref 101–111)
Creatinine, Ser: 0.73 mg/dL (ref 0.44–1.00)
GFR calc Af Amer: >60 mL/min (ref >60)
GFR calc non Af Amer: >60 mL/min (ref >60)
Glucose, Bld: 78 mg/dL (ref 65–99)
Potassium: 4.3 mmol/L (ref 3.5–5.1)
Sodium: 134 mmol/L — ABNORMAL LOW (ref 135–145)
Total Bilirubin: 0.3 mg/dL (ref 0.3–1.2)
Total Protein: 5.5 g/dL — ABNORMAL LOW (ref 6.5–8.1)

## 2017-03-12 MED ORDER — LABETALOL HCL 200 MG PO TABS
200.0000 mg | ORAL_TABLET | Freq: Three times a day (TID) | ORAL | Status: DC
  Administered 2017-03-12: 200 mg via ORAL
  Filled 2017-03-12: qty 1

## 2017-03-12 MED ORDER — IBUPROFEN 600 MG PO TABS: 600.0000 mg | ORAL_TABLET | Freq: Four times a day (QID) | ORAL | 0 refills | Status: DC

## 2017-03-12 MED ORDER — LABETALOL HCL 200 MG PO TABS: 200.0000 mg | ORAL_TABLET | Freq: Three times a day (TID) | ORAL | 0 refills | Status: DC

## 2017-03-12 MED ORDER — OXYCODONE-ACETAMINOPHEN 5-325 MG PO TABS: 1.0000 | ORAL_TABLET | ORAL | 0 refills | Status: DC | PRN

## 2017-03-12 NOTE — Discharge Instructions (Signed)
Call MD for T>100.4, heavy vaginal bleeding, severe abdominal pain, intractable nausea and/or vomiting, or respiratory distress.  Call office to schedule 1 week BP and incision check.  Pelvic rest x 6 weeks.  No driving while taking narcotics.  Monitor for headache, chest pain, shortness of breath, right upper quadrant pain and visual disturbance.

## 2017-03-12 NOTE — Lactation Note (Signed)
This note was copied from a baby's chart. Lactation Consultation Note  Patient Name: Bonnie Hall Date: 03/12/2017 Reason for consult: Follow-up assessment;Infant weight loss   Follow up with mom of 82 hour old Early Term infant. Infant with 12 BF for 10-35 minutes, 6 voids and 5 stools in last 24 hours. LATCH scores 8-9. Infant weight 6 lb 13.4 oz with weight loss of 7%.   Infant was latched to breast when LC entered room. Mom had infant supported and latched deeply. Infant with multiple swallows that increase with breast compression. Infant did need some stimulation to maintain suckling, mom did well with awakening techniques as needed. Mom reports her milk has not come in yet. She reports infant is feeding for longer periods of time.   Reviewed I/O, Engorgement prevention/treatment and breastmilk handling and storage. Manual pump given with instructions for use and cleaning. [facility] Brochure reviewed, mom aware of OP services, BF Support Groups and [place] phone #. Mom aware she can call with questions/concerns prn. Mom is to pick up DEBP from durable medical equipment store. Mom without further questions/concerns at this time.    Maternal Data Formula Feeding for Exclusion: No Has patient been taught Hand Expression?: Yes Does the patient have breastfeeding experience prior to this delivery?: Yes  Feeding Feeding Type: Breast Fed Length of feed: 15 min (still feeding when LC left room)  LATCH Score/Interventions Latch: Repeated attempts needed to sustain latch, nipple held in mouth throughout feeding, stimulation needed to elicit sucking reflex. Intervention(s): Adjust position;Assist with latch;Breast massage;Breast compression  Audible Swallowing: Spontaneous and intermittent Intervention(s): Alternate breast massage;Hand expression;Skin to skin  Type of Nipple: Everted at rest and after stimulation  Comfort (Breast/Nipple): Soft / non-tender     Hold (Positioning): No  assistance needed to correctly position infant at breast. Intervention(s): Breastfeeding basics reviewed;Support Pillows;Position options;Skin to skin  LATCH Score: 9  Lactation Tools Discussed/Used WIC Program: No Pump Review: Setup, frequency, and cleaning;Milk Storage   Consult Status Consult Status: Complete Follow-up type: Call as needed    Donn Pierini 03/12/2017, 8:17 AM

## 2017-03-12 NOTE — Progress Notes (Signed)
Subjective: Postpartum Day 3: Cesarean Delivery Patient reports tolerating PO, + flatus and no problems voiding. Mild HA treated with NSAIDs.  No vision change, CP/SOB, RUQ pain.   Objective: Vital signs in last 24 hours: Temp:  [98.8 F (37.1 C)] 98.8 F (37.1 C) (06/29 2111) Pulse Rate:  [61-72] 61 (06/30 0645) Resp:  [18-20] 18 (06/30 0645) BP: (137-174)/(75-83) 152/75 (06/30 0645)  Physical Exam:  General: alert, cooperative and appears stated age Lochia: appropriate Uterine Fundus: firm Incision: healing well, no significant drainage, no dehiscence DVT Evaluation: No evidence of DVT seen on physical exam. Negative Homan's sign. No cords or calf tenderness. 1+ b/l edema, 1+DTRs   Recent Labs  03/10/17 0525 03/12/17 0619  HGB 11.0* 10.7*  HCT 33.5* 32.4*    Assessment/Plan: Status post Cesarean section. Doing well postoperatively.  Discharge home with standard precautions and return to clinic in 4-6 weeks. GHTN-increased to labetalol 200 tid.  Pre-E precautions given.  RTO early next week for BP and incision check.  Zelene Barga, [place] 03/12/2017, 9:54 AM

## 2017-03-12 NOTE — Discharge Summary (Signed)
Obstetric Discharge Summary Reason for Admission: cesarean section Prenatal Procedures: GHTN Intrapartum Procedures: cesarean: low cervical, transverse Postpartum Procedures: none Complications-Operative and Postpartum: none Hemoglobin  Date Value Ref Range Status  03/12/2017 10.7 (L) 12.0 - 15.0 g/dL Final   HGB  Date Value Ref Range Status  07/26/2013 13.3 11.6 - 15.9 g/dL Final   HCT  Date Value Ref Range Status  03/12/2017 32.4 (L) 36.0 - 46.0 % Final  07/26/2013 39.9 34.8 - 46.6 % Final    Physical Exam:  General: alert, cooperative and appears stated age 55: appropriate Uterine Fundus: firm Incision: healing well, no significant drainage, no dehiscence DVT Evaluation: No evidence of DVT seen on physical exam. Negative Homan's sign. No cords or calf tenderness.  Discharge Diagnoses: Term Pregnancy-delivered and GHTN  Discharge Information: Date: 03/12/2017 Activity: pelvic rest Diet: routine Medications: PNV, Ibuprofen and Percocet, Labetalol Condition: stable Instructions: refer to practice specific booklet Discharge to: home   Newborn Data: Live born female  Birth Weight: 7 lb 6.2 oz (3350 g) APGAR: 9, 9  Home with mother.  Bonnie Hall, [place] 03/12/2017, 10:03 AM

## 2017-06-06 LAB — HM MAMMOGRAPHY

## 2017-07-04 ENCOUNTER — Other Ambulatory Visit: Payer: Self-pay

## 2017-07-04 MED ORDER — ESOMEPRAZOLE MAGNESIUM 40 MG PO CPDR: 40.0000 mg | DELAYED_RELEASE_CAPSULE | Freq: Two times a day (BID) | ORAL | 0 refills | Status: DC

## 2017-07-18 NOTE — Progress Notes (Signed)
Complete Physical  Assessment and Plan:   Bonnie Hall was seen today for annual exam.  Diagnoses and all orders for this visit:  Encounter for routine adult health examination with abnormal findings  Labile hypertension Continue medication Monitor blood pressure at home; call if consistently over 130/80 Continue DASH diet.   Reminder to go to the ER if any CP, SOB, nausea, dizziness, severe HA, changes vision/speech, left arm numbness and tingling and jaw pain. -     Magnesium  Gastroesophageal reflux disease, esophagitis presence not specified Continue with PPI for now due to increased reflux related to recent pregnancy; discussed trial reduction of PPI with addition of H2 blocker; patient would like to postpone for now -     Magnesium  Other migraine without status migrainosus, not intractable -     Magnesium  Other iron deficiency anemia -     CBC with Differential/Platelet -     Iron,Total/Total Iron Binding Cap  Allergic state, subsequent encounter       -      Continue meds; avoid triggers  Bipolar disorder in partial remission, most recent episode depressed ([place])       -      Continue medications, follow up with Dr. Pauline Good and [facility] mood disorder clinic  Abnormal glucose -     Hemoglobin A1c -     Insulin, random  Vitamin D deficiency -     VITAMIN D 25 Hydroxy (Vit-D Deficiency, Fractures)  Medication management -     CBC with Differential/Platelet -     BASIC METABOLIC PANEL WITH GFR -     Hepatic function panel  S/P cesarean section      -      Scar healing well without issues; breast feeding; continue follow up with GYN as needed  Screening for cardiovascular condition -     Lipid panel -     EKG 12-Lead  Screening for deficiency anemia -     CBC with Differential/Platelet -     Iron,Total/Total Iron Binding Cap -     Vitamin B12  Screening for hematuria or proteinuria -     Urinalysis, Complete (81001)  Screening for thyroid  disorder -     TSH  Discussed med's effects and SE's. Screening labs and tests as requested with regular follow-up as recommended. Over 40 minutes of exam, counseling, chart review, and complex, high level critical decision making was performed this visit.   Future Appointments  Date Time Provider [place]  07/21/2018  9:30 AM Liane Comber, NP [facility] None    HPI  56 y.o. female  presents for a complete physical and follow up for has Iron deficiency anemia; Allergy; Sleep apnea; Migraine; Bipolar disorder (manic depression) ([place]); GERD (gastroesophageal reflux disease); Vitamin D deficiency; Medication management; Labile hypertension; Abnormal glucose; and S/P cesarean section on their problem list.. She is 4 months status post c section of baby boy. She continues to breast feed. She is followed by Dr. Pauline Good for Bipolar depression/postpartum depression, and has an upcoming appointment with [facility] mood clinic. She reports she has been seeing a chiropractor for HA and postpartum alignments which have improved HAs somewhat thought she continues to experience 2-3 per week. She reports pain is managed well with current prescriptions; discussed initiating magnesium today. Discussed possible prophylactic agents which she should like to postpone at this time.   BMI is Body mass index is 27.62 kg/m.  Wt Readings from Last 3 Encounters:  07/20/17 166 lb (75.3 kg)  03/09/17 207 lb (93.9 kg)  03/04/17 209 lb (94.8 kg)   Her blood pressure has been controlled at home, today their BP is BP: 112/74 She does workout. She denies chest pain, shortness of breath, dizziness.   She is not on cholesterol medication and denies myalgias. Her cholesterol is at goal. The cholesterol last visit was:   Lab Results  Component Value Date   CHOL 144 02/11/2016   HDL 52 02/11/2016   LDLCALC 71 02/11/2016   TRIG 104 02/11/2016   CHOLHDL 2.8 02/11/2016   She has been working on diet and  exercise for history of elevated glucose. Last A1C in the office was:  Lab Results  Component Value Date   HGBA1C 5.5 02/11/2016   Last GFR: Lab Results  Component Value Date   GFRNONAA >60 03/12/2017   Patient is on Vitamin D supplement but below goal:    Lab Results  Component Value Date   VD25OH 43 02/11/2016      Current Medications:  Current Outpatient Medications on File Prior to Visit  Medication Sig Dispense Refill  . albuterol (VENTOLIN HFA) 108 (90 Base) MCG/ACT inhaler Inhale 2 puffs into the lungs every 4 (four) hours as needed for wheezing or shortness of breath. 1 Inhaler 0  . aspirin EC 81 MG tablet Take 1 tablet (81 mg total) by mouth daily. (Patient taking differently: Take 81 mg by mouth at bedtime. )    . butalbital-acetaminophen-caffeine (FIORICET, ESGIC) 50-325-40 MG tablet Take one tablet every 12 hours as needed for pain 100 tablet 0  . DimenhyDRINATE (MOTION SICKNESS RELIEF PO) Take 1-2 tablets by mouth at bedtime as needed (motion sickness).    Marland Kitchen esomeprazole (NEXIUM) 40 MG capsule Take 40 mg by mouth 2 (two) times daily.    . fluticasone (FLONASE) 50 MCG/ACT nasal spray Place 2 sprays into both nostrils daily. (Patient taking differently: Place 2 sprays into both nostrils daily as needed for allergies. ) 16 g 2  . folic acid (FOLVITE) 629 MCG tablet Take 800 mcg by mouth at bedtime.     Marland Kitchen ibuprofen (ADVIL,MOTRIN) 600 MG tablet Take 1 tablet (600 mg total) by mouth every 6 (six) hours. 30 tablet 0  . lamoTRIgine (LAMICTAL) 150 MG tablet Take 200 mg at bedtime by mouth.     . lurasidone (LATUDA) 20 MG TABS tablet Take 40 mg at bedtime by mouth.     . Melatonin 3 MG CAPS Take 6 mg by mouth at bedtime.     . montelukast (SINGULAIR) 10 MG tablet Take 1 tablet (10 mg total) by mouth at bedtime. 90 tablet 3  . Prenatal Vit-Fe Fumarate-FA (PRENATAL MULTIVITAMIN) TABS tablet Take 1 tablet by mouth at bedtime.     Marland Kitchen acetaminophen-codeine (TYLENOL #3) 300-30 MG tablet  Take 1-2 tablets by mouth every 4 (four) hours as needed (cough).   0  . diphenhydrAMINE (BENADRYL) 25 MG tablet Take 25 mg by mouth at bedtime as needed for allergies.    Marland Kitchen esomeprazole (NEXIUM) 40 MG capsule Take 1 capsule (40 mg total) by mouth 2 (two) times daily. (Patient not taking: Reported on 07/20/2017) 60 capsule 0  . guaiFENesin (MUCUS-ER) 600 MG 12 hr tablet Take 600 mg by mouth 2 (two) times daily as needed for cough or to loosen phlegm.     . labetalol (NORMODYNE) 200 MG tablet Take 1 tablet (200 mg total) by mouth 3 (three) times daily. (Patient not taking: Reported on  07/20/2017) 90 tablet 0  . oxyCODONE-acetaminophen (PERCOCET/ROXICET) 5-325 MG tablet Take 1 tablet by mouth every 4 (four) hours as needed (pain scale 4-7). (Patient not taking: Reported on 07/20/2017) 30 tablet 0  . PRESCRIPTION MEDICATION Take 10 mLs by mouth every 6 (six) hours. Swich and swallow every 6 hours  Dukes magic mouthwash      No current facility-administered medications on file prior to visit.    Allergies:  No Known Allergies Medical History:  She has Iron deficiency anemia; Allergy; Sleep apnea; Migraine; Bipolar disorder (manic depression) ([place]); GERD (gastroesophageal reflux disease); Vitamin D deficiency; Medication management; Labile hypertension; Abnormal glucose; and S/P cesarean section on their problem list. Health Maintenance:   Immunization History  Administered Date(s) Administered  . Influenza Split 07/10/2013, 07/04/2014, 06/17/2015  . PPD Test 01/14/2014, 01/16/2015  . Pneumococcal Polysaccharide-23 12/31/2011  . Tdap 12/28/2010   TDAP: 2012 Pneumovax:2013 Prevnar 13: N/A Flu vaccine:2016 Shingrix: not available in office  Pap: 12/2015  MGM: 06/06/2017 DEXA: N/A Colonoscopy: 2013 Echo 01/2016  Last Dental Exam: 06/2017 Last Eye Exam: 2013? - encouraged to follow up, wears glasses as needed Last skin exam: never  Patient Care Team: Unk Pinto, MD as PCP - General  (Internal Medicine) Dian Queen, MD as Attending Physician (Obstetrics and Gynecology) Inda Castle, MD as Consulting Physician (Gastroenterology) Heath Lark, MD as Consulting Physician (Hematology and Oncology)  Surgical History:  She has a past surgical history that includes Nasal septum surgery (1983); Tonsillectomy (1966); Cesarean section (2009); Colonoscopy (06/2012); Gynecologic cryosurgery; and REPEAT CESAREAN SECTION (N/A, 03/09/2017). Family History:  Herfamily history includes Breast cancer in her paternal aunt; Colon cancer (age of onset: 18) in her paternal uncle; Colon cancer (age of onset: 16) in her paternal uncle; Hearing loss in her daughter; Prostate cancer in her father; Stroke in her maternal grandfather and maternal grandmother. Social History:  She reports that  has never smoked. she has never used smokeless tobacco. She reports that she does not drink alcohol or use drugs.  Review of Systems: Review of Systems  Constitutional: Negative for malaise/fatigue and weight loss.  HENT: Negative for hearing loss and tinnitus.   Eyes: Negative for blurred vision and double vision.  Respiratory: Negative for cough, shortness of breath and wheezing.   Cardiovascular: Negative for chest pain, palpitations, orthopnea, claudication and leg swelling.  Gastrointestinal: Negative for abdominal pain, blood in stool, constipation, diarrhea, heartburn, melena, nausea and vomiting.  Genitourinary: Negative.  Negative for dysuria, frequency and urgency.  Musculoskeletal: Negative for joint pain and myalgias.  Skin: Negative for rash.  Neurological: Negative for dizziness, tingling, sensory change, weakness and headaches.  Endo/Heme/Allergies: Negative for polydipsia.  Psychiatric/Behavioral: Positive for depression. Negative for hallucinations, substance abuse and suicidal ideas. The patient is not nervous/anxious and does not have insomnia.   All other systems reviewed and are  negative.   Physical Exam: Estimated body mass index is 27.62 kg/m as calculated from the following:   Height as of this encounter: 5\' 5"  (1.651 m).   Weight as of this encounter: 166 lb (75.3 kg). BP 112/74   Pulse 83   Temp 98.1 F (36.7 C)   Ht 5\' 5"  (1.651 m)   Wt 166 lb (75.3 kg)   LMP 03/12/2012   SpO2 97%   Breastfeeding? Yes   BMI 27.62 kg/m  General Appearance: Well nourished, in no apparent distress.  Eyes: PERRLA, EOMs, conjunctiva no swelling or erythema, normal fundi and vessels.  Sinuses: No Frontal/maxillary tenderness  ENT/Mouth: Ext aud canals clear, normal light reflex with TMs without erythema, bulging. Good dentition. No erythema, swelling, or exudate on post pharynx. Tonsils not swollen or erythematous. Hearing normal.  Neck: Supple, thyroid normal. No bruits  Respiratory: Respiratory effort normal, BS equal bilaterally without rales, rhonchi, wheezing or stridor.  Cardio: RRR without murmurs, rubs or gallops. Brisk peripheral pulses without edema.  Chest: symmetric, with normal excursions and percussion.  Breasts: Defer to GYN Abdomen: Soft, nontender, no guarding, rebound, hernias, masses, or organomegaly.  Lymphatics: Non tender without lymphadenopathy.  Genitourinary: Defer to GYN Musculoskeletal: Full ROM all peripheral extremities,5/5 strength, and normal gait.  Skin: Warm, dry without rashes, lesions, ecchymosis. Neuro: Cranial nerves intact, reflexes equal bilaterally. Normal muscle tone, no cerebellar symptoms. Sensation intact.  Psych: Awake and oriented X 3, normal affect, Insight and Judgment appropriate.   EKG: WNL no ST changes.   Bonnie Hall 9:20 AM [facility]

## 2017-07-20 ENCOUNTER — Ambulatory Visit (INDEPENDENT_AMBULATORY_CARE_PROVIDER_SITE_OTHER): Admitting: Adult Health

## 2017-07-20 ENCOUNTER — Encounter: Payer: Self-pay | Admitting: Adult Health

## 2017-07-20 VITALS — BP 112/74 | HR 83 | Temp 98.1°F | Ht 65.0 in | Wt 166.0 lb

## 2017-07-20 DIAGNOSIS — Z0000 Encounter for general adult medical examination without abnormal findings: Secondary | ICD-10-CM | POA: Diagnosis not present

## 2017-07-20 DIAGNOSIS — F3175 Bipolar disorder, in partial remission, most recent episode depressed: Secondary | ICD-10-CM

## 2017-07-20 DIAGNOSIS — E559 Vitamin D deficiency, unspecified: Secondary | ICD-10-CM | POA: Diagnosis not present

## 2017-07-20 DIAGNOSIS — G43809 Other migraine, not intractable, without status migrainosus: Secondary | ICD-10-CM

## 2017-07-20 DIAGNOSIS — I10 Essential (primary) hypertension: Secondary | ICD-10-CM

## 2017-07-20 DIAGNOSIS — R7309 Other abnormal glucose: Secondary | ICD-10-CM

## 2017-07-20 DIAGNOSIS — K219 Gastro-esophageal reflux disease without esophagitis: Secondary | ICD-10-CM

## 2017-07-20 DIAGNOSIS — Z79899 Other long term (current) drug therapy: Secondary | ICD-10-CM

## 2017-07-20 DIAGNOSIS — Z98891 History of uterine scar from previous surgery: Secondary | ICD-10-CM

## 2017-07-20 DIAGNOSIS — Z136 Encounter for screening for cardiovascular disorders: Secondary | ICD-10-CM | POA: Diagnosis not present

## 2017-07-20 DIAGNOSIS — Z130 Encounter for screening for diseases of the blood and blood-forming organs and certain disorders involving the immune mechanism: Secondary | ICD-10-CM

## 2017-07-20 DIAGNOSIS — Z1329 Encounter for screening for other suspected endocrine disorder: Secondary | ICD-10-CM

## 2017-07-20 DIAGNOSIS — D508 Other iron deficiency anemias: Secondary | ICD-10-CM

## 2017-07-20 DIAGNOSIS — G44219 Episodic tension-type headache, not intractable: Secondary | ICD-10-CM

## 2017-07-20 DIAGNOSIS — R0989 Other specified symptoms and signs involving the circulatory and respiratory systems: Secondary | ICD-10-CM

## 2017-07-20 DIAGNOSIS — Z1389 Encounter for screening for other disorder: Secondary | ICD-10-CM

## 2017-07-20 DIAGNOSIS — T7840XD Allergy, unspecified, subsequent encounter: Secondary | ICD-10-CM

## 2017-07-20 DIAGNOSIS — Z0001 Encounter for general adult medical examination with abnormal findings: Secondary | ICD-10-CM

## 2017-07-20 MED ORDER — BUTALBITAL-APAP-CAFFEINE 50-325-40 MG PO TABS: ORAL_TABLET | ORAL | 2 refills | Status: DC

## 2017-07-20 MED ORDER — MONTELUKAST SODIUM 10 MG PO TABS: 10.0000 mg | ORAL_TABLET | Freq: Every day | ORAL | 3 refills | Status: DC

## 2017-07-20 MED ORDER — ESOMEPRAZOLE MAGNESIUM 40 MG PO CPDR: 40.0000 mg | DELAYED_RELEASE_CAPSULE | Freq: Two times a day (BID) | ORAL | 3 refills | Status: DC

## 2017-07-20 NOTE — Patient Instructions (Addendum)
Consider magnesium supplement for migraines; we may follow up in 6 months pending lab results to discuss a daily medication for migraines.    Preventive Care for Adults  A healthy lifestyle and preventive care can promote health and wellness. Preventive health guidelines for women include the following key practices.  A routine yearly physical is a good way to check with your health care provider about your health and preventive screening. It is a chance to share any concerns and updates on your health and to receive a thorough exam.  Visit your dentist for a routine exam and preventive care every 6 months. Brush your teeth twice a day and floss once a day. Good oral hygiene prevents tooth decay and gum disease.  The frequency of eye exams is based on your age, health, family medical history, use of contact lenses, and other factors. Follow your health care provider's recommendations for frequency of eye exams.  Eat a healthy diet. Foods like vegetables, fruits, whole grains, low-fat dairy products, and lean protein foods contain the nutrients you need without too many calories. Decrease your intake of foods high in solid fats, added sugars, and salt. Eat the right amount of calories for you.Get information about a proper diet from your health care provider, if necessary.  Regular physical exercise is one of the most important things you can do for your health. Most adults should get at least 150 minutes of moderate-intensity exercise (any activity that increases your heart rate and causes you to sweat) each week. In addition, most adults need muscle-strengthening exercises on 2 or more days a week.  Maintain a healthy weight. The body mass index (BMI) is a screening tool to identify possible weight problems. It provides an estimate of body fat based on height and weight. Your health care provider can find your BMI and can help you achieve or maintain a healthy weight.For adults 20 years and  older:  A BMI below 18.5 is considered underweight.  A BMI of 18.5 to 24.9 is normal.  A BMI of 25 to 29.9 is considered overweight.  A BMI of 30 and above is considered obese.  Maintain normal blood lipids and cholesterol levels by exercising and minimizing your intake of saturated fat. Eat a balanced diet with plenty of fruit and vegetables. Blood tests for lipids and cholesterol should begin at age 68 and be repeated every 5 years. If your lipid or cholesterol levels are high, you are over 50, or you are at high risk for heart disease, you may need your cholesterol levels checked more frequently.Ongoing high lipid and cholesterol levels should be treated with medicines if diet and exercise are not working.  If you smoke, find out from your health care provider how to quit. If you do not use tobacco, do not start.  Lung cancer screening is recommended for adults aged 78-80 years who are at high risk for developing lung cancer because of a history of smoking. A yearly low-dose CT scan of the lungs is recommended for people who have at least a 30-pack-year history of smoking and are a current smoker or have quit within the past 15 years. A pack year of smoking is smoking an average of 1 pack of cigarettes a day for 1 year (for example: 1 pack a day for 30 years or 2 packs a day for 15 years). Yearly screening should continue until the smoker has stopped smoking for at least 15 years. Yearly screening should be stopped for people who  develop a health problem that would prevent them from having lung cancer treatment.  High blood pressure causes heart disease and increases the risk of stroke. Your blood pressure should be checked at least every 1 to 2 years. Ongoing high blood pressure should be treated with medicines if weight loss and exercise do not work.  If you are 62-4 years old, ask your health care provider if you should take aspirin to prevent strokes.  Diabetes screening involves taking  a blood sample to check your fasting blood sugar level. This should be done once every 3 years, after age 41, if you are within normal weight and without risk factors for diabetes. Testing should be considered at a younger age or be carried out more frequently if you are overweight and have at least 1 risk factor for diabetes.  Breast cancer screening is essential preventive care for women. You should practice "breast self-awareness." This means understanding the normal appearance and feel of your breasts and may include breast self-examination. Any changes detected, no matter how small, should be reported to a health care provider. Women in their 84s and 30s should have a clinical breast exam (CBE) by a health care provider as part of a regular health exam every 1 to 3 years. After age 89, women should have a CBE every year. Starting at age 64, women should consider having a mammogram (breast X-ray test) every year. Women who have a family history of breast cancer should talk to their health care provider about genetic screening. Women at a high risk of breast cancer should talk to their health care providers about having an MRI and a mammogram every year.  Breast cancer gene (BRCA)-related cancer risk assessment is recommended for women who have family members with BRCA-related cancers. BRCA-related cancers include breast, ovarian, tubal, and peritoneal cancers. Having family members with these cancers may be associated with an increased risk for harmful changes (mutations) in the breast cancer genes BRCA1 and BRCA2. Results of the assessment will determine the need for genetic counseling and BRCA1 and BRCA2 testing.  Routine pelvic exams to screen for cancer are no longer recommended for nonpregnant women who are considered low risk for cancer of the pelvic organs (ovaries, uterus, and vagina) and who do not have symptoms. Ask your health care provider if a screening pelvic exam is right for you.  If you  have had past treatment for cervical cancer or a condition that could lead to cancer, you need Pap tests and screening for cancer for at least 20 years after your treatment. If Pap tests have been discontinued, your risk factors (such as having a new sexual partner) need to be reassessed to determine if screening should be resumed. Some women have medical problems that increase the chance of getting cervical cancer. In these cases, your health care provider may recommend more frequent screening and Pap tests.  Colorectal cancer can be detected and often prevented. Most routine colorectal cancer screening begins at the age of 59 years and continues through age 12 years. However, your health care provider may recommend screening at an earlier age if you have risk factors for colon cancer. On a yearly basis, your health care provider may provide home test kits to check for hidden blood in the stool. Use of a small camera at the end of a tube, to directly examine the colon (sigmoidoscopy or colonoscopy), can detect the earliest forms of colorectal cancer. Talk to your health care provider about this at age  50, when routine screening begins. Direct exam of the colon should be repeated every 5-10 years through age 64 years, unless early forms of pre-cancerous polyps or small growths are found.  Hepatitis C blood testing is recommended for all people born from [birth year] through 1965 and any individual with known risks for hepatitis C.  Pra  Osteoporosis is a disease in which the bones lose minerals and strength with aging. This can result in serious bone fractures or breaks. The risk of osteoporosis can be identified using a bone density scan. Women ages 67 years and over and women at risk for fractures or osteoporosis should discuss screening with their health care providers. Ask your health care provider whether you should take a calcium supplement or vitamin D to reduce the rate of osteoporosis.  Menopause can be  associated with physical symptoms and risks. Hormone replacement therapy is available to decrease symptoms and risks. You should talk to your health care provider about whether hormone replacement therapy is right for you.  Use sunscreen. Apply sunscreen liberally and repeatedly throughout the day. You should seek shade when your shadow is shorter than you. Protect yourself by wearing long sleeves, pants, a wide-brimmed hat, and sunglasses year round, whenever you are outdoors.  Once a month, do a whole body skin exam, using a mirror to look at the skin on your back. Tell your health care provider of new moles, moles that have irregular borders, moles that are larger than a pencil eraser, or moles that have changed in shape or color.  Stay current with required vaccines (immunizations).  Influenza vaccine. All adults should be immunized every year.  Tetanus, diphtheria, and acellular pertussis (Td, Tdap) vaccine. Pregnant women should receive 1 dose of Tdap vaccine during each pregnancy. The dose should be obtained regardless of the length of time since the last dose. Immunization is preferred during the 27th-36th week of gestation. An adult who has not previously received Tdap or who does not know her vaccine status should receive 1 dose of Tdap. This initial dose should be followed by tetanus and diphtheria toxoids (Td) booster doses every 10 years. Adults with an unknown or incomplete history of completing a 3-dose immunization series with Td-containing vaccines should begin or complete a primary immunization series including a Tdap dose. Adults should receive a Td booster every 10 years.  Varicella vaccine. An adult without evidence of immunity to varicella should receive 2 doses or a second dose if she has previously received 1 dose. Pregnant females who do not have evidence of immunity should receive the first dose after pregnancy. This first dose should be obtained before leaving the health care  facility. The second dose should be obtained 4-8 weeks after the first dose.  Human papillomavirus (HPV) vaccine. Females aged 13-26 years who have not received the vaccine previously should obtain the 3-dose series. The vaccine is not recommended for use in pregnant females. However, pregnancy testing is not needed before receiving a dose. If a female is found to be pregnant after receiving a dose, no treatment is needed. In that case, the remaining doses should be delayed until after the pregnancy. Immunization is recommended for any person with an immunocompromised condition through the age of 61 years if she did not get any or all doses earlier. During the 3-dose series, the second dose should be obtained 4-8 weeks after the first dose. The third dose should be obtained 24 weeks after the first dose and 16 weeks after the second  dose.  Zoster vaccine. One dose is recommended for adults aged 5 years or older unless certain conditions are present.  Measles, mumps, and rubella (MMR) vaccine. Adults born before 40 generally are considered immune to measles and mumps. Adults born in [birth year] or later should have 1 or more doses of MMR vaccine unless there is a contraindication to the vaccine or there is laboratory evidence of immunity to each of the three diseases. A routine second dose of MMR vaccine should be obtained at least 28 days after the first dose for students attending postsecondary schools, health care workers, or international travelers. People who received inactivated measles vaccine or an unknown type of measles vaccine during 1963-1967 should receive 2 doses of MMR vaccine. People who received inactivated mumps vaccine or an unknown type of mumps vaccine before 1979 and are at high risk for mumps infection should consider immunization with 2 doses of MMR vaccine. For females of childbearing age, rubella immunity should be determined. If there is no evidence of immunity, females who are not  pregnant should be vaccinated. If there is no evidence of immunity, females who are pregnant should delay immunization until after pregnancy. Unvaccinated health care workers born before [birth year] who lack laboratory evidence of measles, mumps, or rubella immunity or laboratory confirmation of disease should consider measles and mumps immunization with 2 doses of MMR vaccine or rubella immunization with 1 dose of MMR vaccine.  Pneumococcal 13-valent conjugate (PCV13) vaccine. When indicated, a person who is uncertain of her immunization history and has no record of immunization should receive the PCV13 vaccine. An adult aged 13 years or older who has certain medical conditions and has not been previously immunized should receive 1 dose of PCV13 vaccine. This PCV13 should be followed with a dose of pneumococcal polysaccharide (PPSV23) vaccine. The PPSV23 vaccine dose should be obtained at least 8 weeks after the dose of PCV13 vaccine. An adult aged 54 years or older who has certain medical conditions and previously received 1 or more doses of PPSV23 vaccine should receive 1 dose of PCV13. The PCV13 vaccine dose should be obtained 1 or more years after the last PPSV23 vaccine dose.    Pneumococcal polysaccharide (PPSV23) vaccine. When PCV13 is also indicated, PCV13 should be obtained first. All adults aged 7 years and older should be immunized. An adult younger than age 3 years who has certain medical conditions should be immunized. Any person who resides in a nursing home or long-term care facility should be immunized. An adult smoker should be immunized. People with an immunocompromised condition and certain other conditions should receive both PCV13 and PPSV23 vaccines. People with human immunodeficiency virus (HIV) infection should be immunized as soon as possible after diagnosis. Immunization during chemotherapy or radiation therapy should be avoided. Routine use of PPSV23 vaccine is not recommended for  American Indians, [place] Natives, or people younger than 65 years unless there are medical conditions that require PPSV23 vaccine. When indicated, people who have unknown immunization and have no record of immunization should receive PPSV23 vaccine. One-time revaccination 5 years after the first dose of PPSV23 is recommended for people aged 19-64 years who have chronic kidney failure, nephrotic syndrome, asplenia, or immunocompromised conditions. People who received 1-2 doses of PPSV23 before age 65 years should receive another dose of PPSV23 vaccine at age 10 years or later if at least 5 years have passed since the previous dose. Doses of PPSV23 are not needed for people immunized with PPSV23 at or after age  65 years.  Preventive Services / Frequency   Ages 50 to 67 years  Blood pressure check.  Lipid and cholesterol check.  Lung cancer screening. / Every year if you are aged 53-80 years and have a 30-pack-year history of smoking and currently smoke or have quit within the past 15 years. Yearly screening is stopped once you have quit smoking for at least 15 years or develop a health problem that would prevent you from having lung cancer treatment.  Clinical breast exam.** / Every year after age 23 years.  BRCA-related cancer risk assessment.** / For women who have family members with a BRCA-related cancer (breast, ovarian, tubal, or peritoneal cancers).  Mammogram.** / Every year beginning at age 60 years and continuing for as long as you are in good health. Consult with your health care provider.  Pap test.** / Every 3 years starting at age 37 years through age 65 or 71 years with a history of 3 consecutive normal Pap tests.  HPV screening.** / Every 3 years from ages 66 years through ages 71 to 40 years with a history of 3 consecutive normal Pap tests.  Fecal occult blood test (FOBT) of stool. / Every year beginning at age 20 years and continuing until age 61 years. You may not need to do  this test if you get a colonoscopy every 10 years.  Flexible sigmoidoscopy or colonoscopy.** / Every 5 years for a flexible sigmoidoscopy or every 10 years for a colonoscopy beginning at age 34 years and continuing until age 49 years.  Hepatitis C blood test.** / For all people born from [birth year] through 1965 and any individual with known risks for hepatitis C.  Skin self-exam. / Monthly.  Influenza vaccine. / Every year.  Tetanus, diphtheria, and acellular pertussis (Tdap/Td) vaccine.** / Consult your health care provider. Pregnant women should receive 1 dose of Tdap vaccine during each pregnancy. 1 dose of Td every 10 years.  Varicella vaccine.** / Consult your health care provider. Pregnant females who do not have evidence of immunity should receive the first dose after pregnancy.  Zoster vaccine.** / 1 dose for adults aged 12 years or older.  Pneumococcal 13-valent conjugate (PCV13) vaccine.** / Consult your health care provider.  Pneumococcal polysaccharide (PPSV23) vaccine.** / 1 to 2 doses if you smoke cigarettes or if you have certain conditions.  Meningococcal vaccine.** / Consult your health care provider.  Hepatitis A vaccine.** / Consult your health care provider.  Hepatitis B vaccine.** / Consult your health care provider. Screening for abdominal aortic aneurysm (AAA)  by ultrasound is recommended for people over 50 who have history of high blood pressure or who are current or former smokers.

## 2017-07-21 ENCOUNTER — Other Ambulatory Visit: Payer: Self-pay | Admitting: Adult Health

## 2017-07-21 DIAGNOSIS — D473 Essential (hemorrhagic) thrombocythemia: Secondary | ICD-10-CM

## 2017-07-21 DIAGNOSIS — G44219 Episodic tension-type headache, not intractable: Secondary | ICD-10-CM

## 2017-07-21 DIAGNOSIS — D75839 Thrombocytosis, unspecified: Secondary | ICD-10-CM

## 2017-07-21 LAB — HEPATIC FUNCTION PANEL
AG Ratio: 1.7 (calc) (ref 1.0–2.5)
ALT: 14 U/L (ref 6–29)
AST: 13 U/L (ref 10–35)
Albumin: 4.3 g/dL (ref 3.6–5.1)
Alkaline phosphatase (APISO): 77 U/L (ref 33–130)
Bilirubin, Direct: 0.1 mg/dL (ref 0.0–0.2)
Globulin: 2.6 g/dL (calc) (ref 1.9–3.7)
Indirect Bilirubin: 0.2 mg/dL (calc) (ref 0.2–1.2)
Total Bilirubin: 0.3 mg/dL (ref 0.2–1.2)
Total Protein: 6.9 g/dL (ref 6.1–8.1)

## 2017-07-21 LAB — HEMOGLOBIN A1C
Hgb A1c MFr Bld: 5.7 % of total Hgb — ABNORMAL HIGH (ref <5.7)
Mean Plasma Glucose: 117 (calc)
eAG (mmol/L): 6.5 (calc)

## 2017-07-21 LAB — BASIC METABOLIC PANEL WITH GFR
BUN: 19 mg/dL (ref 7–25)
CO2: 28 mmol/L (ref 20–32)
Calcium: 9.7 mg/dL (ref 8.6–10.4)
Chloride: 104 mmol/L (ref 98–110)
Creat: 0.79 mg/dL (ref 0.50–1.05)
GFR, Est African American: 98 mL/min/{1.73_m2} (ref >=60)
GFR, Est Non African American: 84 mL/min/{1.73_m2} (ref >=60)
Glucose, Bld: 91 mg/dL (ref 65–99)
Potassium: 4.6 mmol/L (ref 3.5–5.3)
Sodium: 139 mmol/L (ref 135–146)

## 2017-07-21 LAB — URINALYSIS, COMPLETE
Bacteria, UA: NONE SEEN /HPF
Bilirubin Urine: NEGATIVE
Glucose, UA: NEGATIVE
Hgb urine dipstick: NEGATIVE
Hyaline Cast: NONE SEEN /LPF
Ketones, ur: NEGATIVE
Leukocytes, UA: NEGATIVE
Nitrite: NEGATIVE
Protein, ur: NEGATIVE
Specific Gravity, Urine: 1.022 (ref 1.001–1.03)
Squamous Epithelial / LPF: NONE SEEN /HPF (ref <=5)
WBC, UA: NONE SEEN /HPF (ref 0–5)
pH: 6.5 (ref 5.0–8.0)

## 2017-07-21 LAB — CBC WITH DIFFERENTIAL/PLATELET
Basophils Absolute: 80 cells/uL (ref 0–200)
Basophils Relative: 1.2 %
Eosinophils Absolute: 221 cells/uL (ref 15–500)
Eosinophils Relative: 3.3 %
HCT: 39.8 % (ref 35.0–45.0)
Hemoglobin: 13.5 g/dL (ref 11.7–15.5)
Lymphs Abs: 1380 cells/uL (ref 850–3900)
MCH: 30.7 pg (ref 27.0–33.0)
MCHC: 33.9 g/dL (ref 32.0–36.0)
MCV: 90.5 fL (ref 80.0–100.0)
MPV: 10.9 fL (ref 7.5–12.5)
Monocytes Relative: 7 %
Neutro Abs: 4549 cells/uL (ref 1500–7800)
Neutrophils Relative %: 67.9 %
Platelets: 492 10*3/uL — ABNORMAL HIGH (ref 140–400)
RBC: 4.4 10*6/uL (ref 3.80–5.10)
RDW: 13.5 % (ref 11.0–15.0)
Total Lymphocyte: 20.6 %
WBC mixed population: 469 cells/uL (ref 200–950)
WBC: 6.7 10*3/uL (ref 3.8–10.8)

## 2017-07-21 LAB — VITAMIN B12: Vitamin B-12: 428 pg/mL (ref 200–1100)

## 2017-07-21 LAB — TSH: TSH: 0.86 mIU/L

## 2017-07-21 LAB — LIPID PANEL
Cholesterol: 240 mg/dL — ABNORMAL HIGH (ref <200)
HDL: 64 mg/dL (ref >50)
LDL Cholesterol (Calc): 145 mg/dL (calc) — ABNORMAL HIGH
Non-HDL Cholesterol (Calc): 176 mg/dL (calc) — ABNORMAL HIGH (ref <130)
Total CHOL/HDL Ratio: 3.8 (calc) (ref <5.0)
Triglycerides: 170 mg/dL — ABNORMAL HIGH (ref <150)

## 2017-07-21 LAB — IRON, TOTAL/TOTAL IRON BINDING CAP
%SAT: 44 % (calc) (ref 11–50)
Iron: 135 ug/dL (ref 45–160)
TIBC: 310 mcg/dL (calc) (ref 250–450)

## 2017-07-21 LAB — MAGNESIUM: Magnesium: 2.1 mg/dL (ref 1.5–2.5)

## 2017-07-21 LAB — VITAMIN D 25 HYDROXY (VIT D DEFICIENCY, FRACTURES): Vit D, 25-Hydroxy: 35 ng/mL (ref 30–100)

## 2017-07-21 LAB — INSULIN, RANDOM: Insulin: 7.2 u[IU]/mL (ref 2.0–19.6)

## 2017-07-21 MED ORDER — BUTALBITAL-APAP-CAFFEINE 50-325-40 MG PO TABS: ORAL_TABLET | ORAL | 2 refills | Status: DC

## 2017-10-19 ENCOUNTER — Other Ambulatory Visit: Payer: Self-pay

## 2017-10-19 DIAGNOSIS — G44219 Episodic tension-type headache, not intractable: Secondary | ICD-10-CM

## 2017-10-19 MED ORDER — BUTALBITAL-APAP-CAFFEINE 50-325-40 MG PO TABS: ORAL_TABLET | ORAL | 2 refills | Status: DC

## 2017-10-19 NOTE — Telephone Encounter (Signed)
Refill request for Fioricet. Last office visit on 07/20/17, next office visit on 07/21/18. In que for your review. OK to fill?

## 2017-11-09 ENCOUNTER — Other Ambulatory Visit: Payer: Self-pay

## 2017-11-09 DIAGNOSIS — G44219 Episodic tension-type headache, not intractable: Secondary | ICD-10-CM

## 2017-11-09 MED ORDER — BUTALBITAL-APAP-CAFFEINE 50-325-40 MG PO TABS: ORAL_TABLET | ORAL | 1 refills | Status: DC

## 2017-11-09 NOTE — Telephone Encounter (Signed)
Patient states that she never received her Fioricet.

## 2017-12-20 ENCOUNTER — Telehealth: Payer: Self-pay

## 2017-12-20 DIAGNOSIS — G44219 Episodic tension-type headache, not intractable: Secondary | ICD-10-CM

## 2017-12-20 MED ORDER — BUTALBITAL-APAP-CAFFEINE 50-325-40 MG PO TABS: ORAL_TABLET | ORAL | 1 refills | Status: DC

## 2017-12-20 NOTE — Telephone Encounter (Signed)
Refill request for Fioricet. Request is to have it written for 2 tablets at a time. One does not help.

## 2017-12-21 ENCOUNTER — Other Ambulatory Visit: Payer: Self-pay | Admitting: Adult Health

## 2017-12-21 DIAGNOSIS — G44219 Episodic tension-type headache, not intractable: Secondary | ICD-10-CM

## 2017-12-21 MED ORDER — BUTALBITAL-APAP-CAFFEINE 50-325-40 MG PO TABS: ORAL_TABLET | ORAL | 0 refills | Status: DC

## 2018-01-26 ENCOUNTER — Other Ambulatory Visit: Payer: Self-pay | Admitting: Adult Health

## 2018-01-26 DIAGNOSIS — G44219 Episodic tension-type headache, not intractable: Secondary | ICD-10-CM

## 2018-02-15 ENCOUNTER — Encounter: Payer: Self-pay | Admitting: Physician Assistant

## 2018-03-15 ENCOUNTER — Other Ambulatory Visit: Payer: Self-pay | Admitting: Adult Health

## 2018-03-15 DIAGNOSIS — G44219 Episodic tension-type headache, not intractable: Secondary | ICD-10-CM

## 2018-03-15 MED ORDER — BUTALBITAL-APAP-CAFFEINE 50-325-40 MG PO TABS: ORAL_TABLET | ORAL | 0 refills | Status: DC

## 2018-04-20 ENCOUNTER — Other Ambulatory Visit: Payer: Self-pay | Admitting: Adult Health

## 2018-04-20 ENCOUNTER — Other Ambulatory Visit: Payer: Self-pay

## 2018-04-20 DIAGNOSIS — G44219 Episodic tension-type headache, not intractable: Secondary | ICD-10-CM

## 2018-04-20 MED ORDER — ESOMEPRAZOLE MAGNESIUM 40 MG PO CPDR
40.0000 mg | DELAYED_RELEASE_CAPSULE | Freq: Every day | ORAL | 1 refills | Status: DC
Start: 2018-04-20 — End: 2019-08-01

## 2018-04-20 MED ORDER — BUTALBITAL-APAP-CAFFEINE 50-325-40 MG PO TABS: ORAL_TABLET | ORAL | 0 refills | Status: DC

## 2018-04-20 NOTE — Telephone Encounter (Signed)
Refill request for Fioricet and Esomeprazole. Last office visit was on 07/20/17, next office visit on 07/21/18. In que for your review.

## 2018-07-05 IMAGING — DX DG CHEST 2V
2 series · 2 of 2 positions shown · non-contrast
Comparison: None.

CLINICAL DATA: Dry cough, shortness of breath, upper airway cough
syndrome, 7 months pregnant

EXAM:
CHEST  2 VIEW

[chest pa]
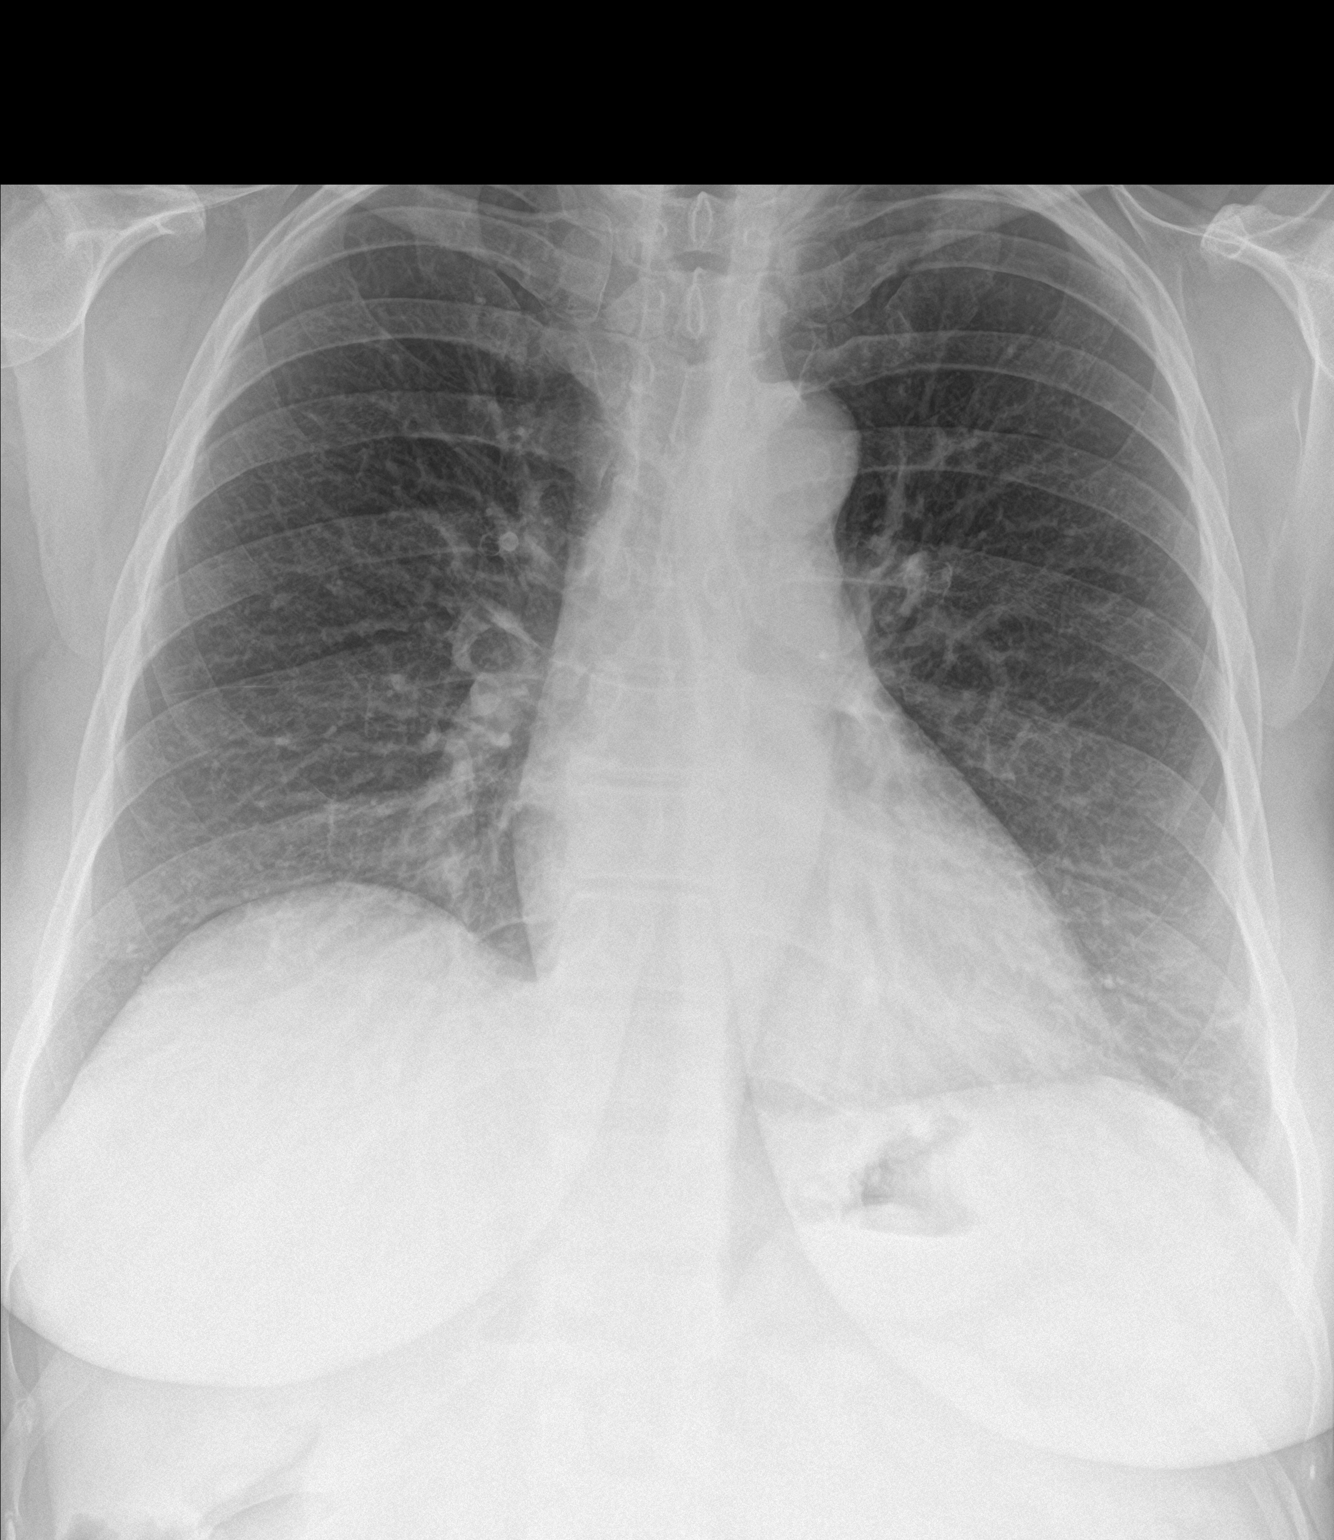

[chest lat]
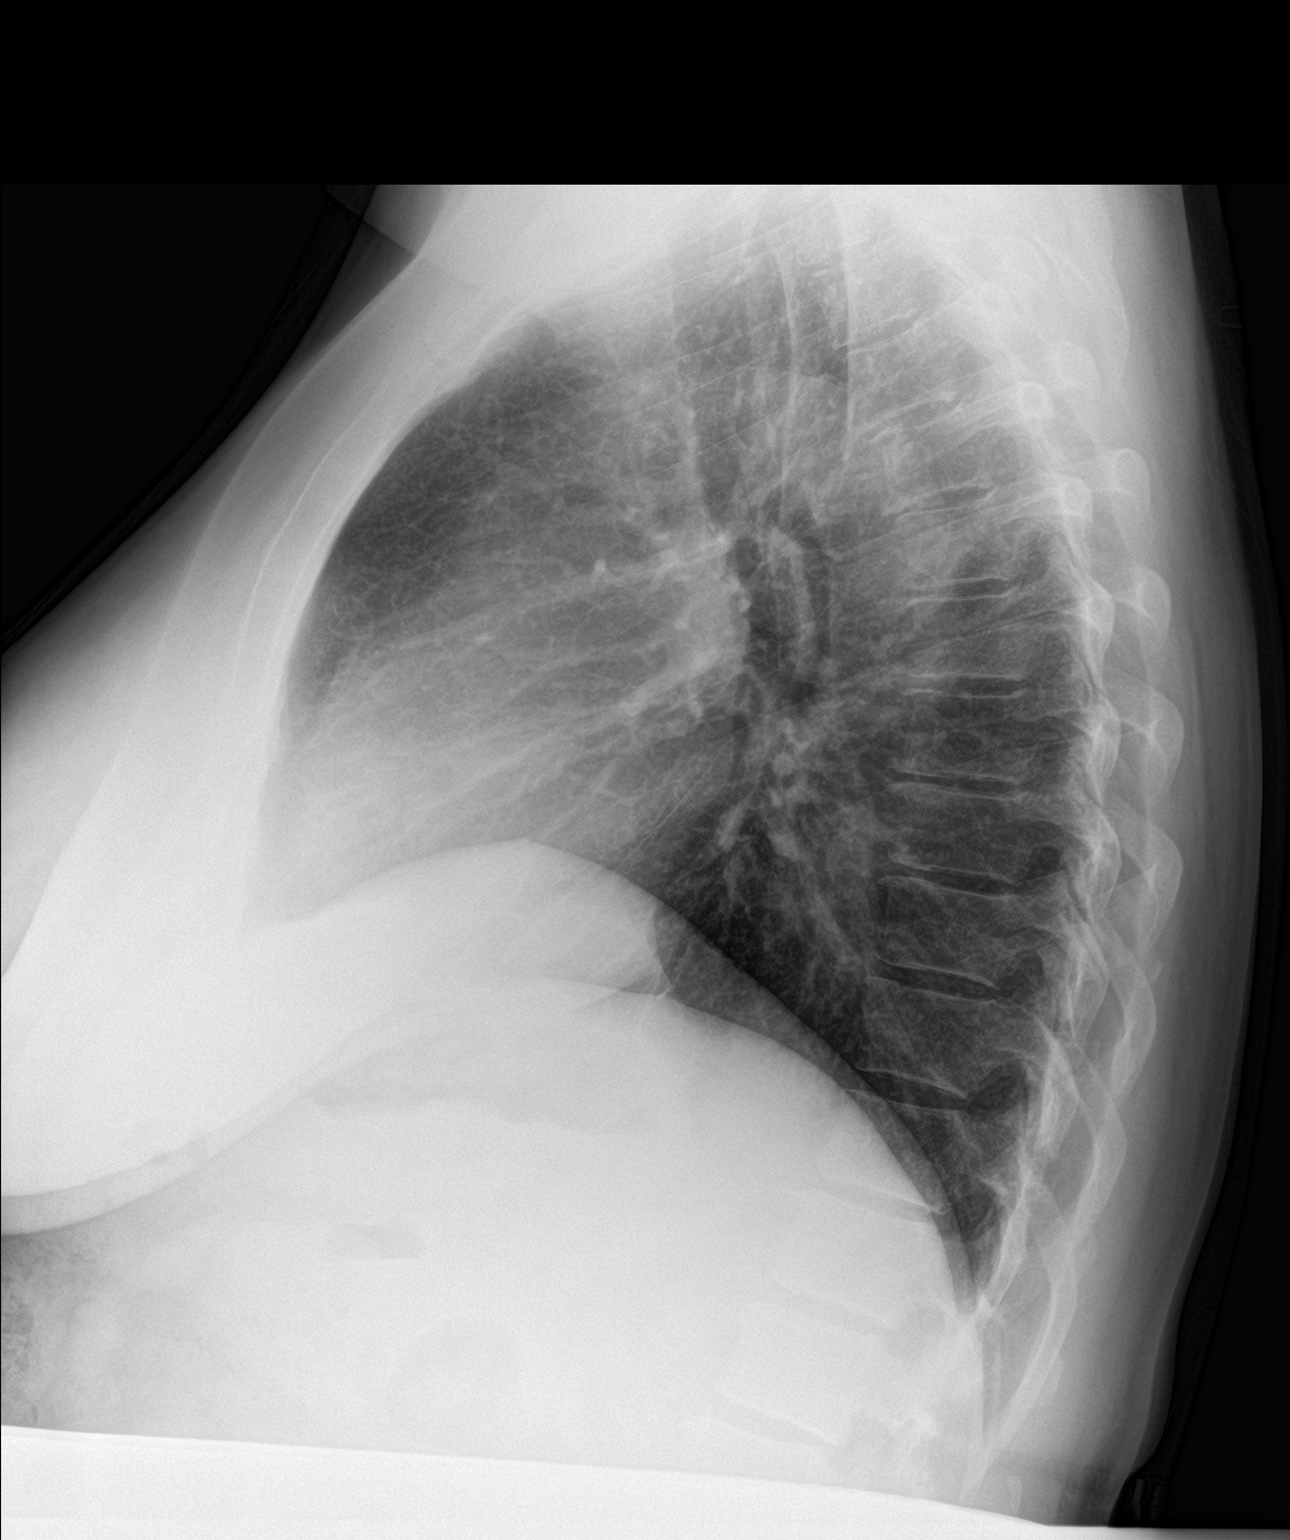

[2 of 2 positions shown; findings below may reference images not displayed]

FINDINGS: Normal heart size, mediastinal contours, and pulmonary vascularity.

Lungs clear.

Minimal central peribronchial thickening.

No pleural effusion or pneumothorax.

Bones unremarkable.
IMPRESSION: Minimal bronchitic changes without infiltrate.

## 2018-07-12 NOTE — Progress Notes (Signed)
Assessment and Plan:  Bonnie Hall was seen today for cough.  Diagnoses and all orders for this visit:  Acute bronchitis, unspecified organism Benign exam; Discussed the importance of avoiding unnecessary antibiotic therapy. Suggested symptomatic OTC remedies. Nasal saline spray for congestion. Nasal steroids, allergy pill, oral steroids offered Follow up as needed; instructed to call if developing a new fever of increasing symptoms -     predniSONE (DELTASONE) 20 MG tablet; 2 tablets daily for 3 days, 1 tablet daily for 4 days. -     promethazine-dextromethorphan (PROMETHAZINE-DM) 6.25-15 MG/5ML syrup; Take 5 mLs by mouth 4 (four) times daily as needed for cough.   Further disposition pending results of labs. Discussed med's effects and SE's.   Over 15 minutes of exam, counseling, chart review, and critical decision making was performed.   Future Appointments  Date Time Provider [place]  07/21/2018  9:30 AM Liane Comber, NP [facility] None    ------------------------------------------------------------------------------------------------------------------   HPI BP 110/72   Pulse 81   Temp (!) 97.5 F (36.4 C)   Ht 5\' 5"  (1.651 m)   Wt 184 lb (83.5 kg)   LMP 03/12/2012   SpO2 95%   BMI 30.62 kg/m   57 y.o.female with allergies and hay fever/exercise induced asthma, GERD presents for evaluation of cough x 2 weeks; she reports she also had body aches, mild diarrhea without fever. "felt like the flu" but though it was mild and this resolved. She reports only ongoing mildly productive but nagging cough, phlegm is thick, clear, "tastes like infection." She endorses dyspnea after coughing fits.   She has been taking robitussin with some benefit but still coughing.   Just got influenza vaccine today.   Past Medical History:  Diagnosis Date  . Allergy    Hay fever/exercise induced asthma  . AMA (advanced maternal age) multigravida 65+   . Anxiety   . Asthma   .  Blood transfusion 1990's  . Depression    bipolar  . GERD (gastroesophageal reflux disease)   . Hyperlipidemia   . Iron deficiency anemia 1990's   negative work up as to etiology; required iron infusion, blood transfusion.  S[facility] an hematologist  at [facility].   . Migraine   . Newborn product of in vitro fertilization (IVF) pregnancy   . Pregnancy induced hypertension   . Preterm labor   . Sleep apnea   . Thrombocytosis ([place])   . Vaginal Pap smear, abnormal      No Known Allergies  Current Outpatient Medications on File Prior to Visit  Medication Sig  . albuterol (VENTOLIN HFA) 108 (90 Base) MCG/ACT inhaler Inhale 2 puffs into the lungs every 4 (four) hours as needed for wheezing or shortness of breath.  Marland Kitchen aspirin EC 81 MG tablet Take 1 tablet (81 mg total) by mouth daily. (Patient taking differently: Take 81 mg by mouth at bedtime. )  . butalbital-acetaminophen-caffeine (FIORICET, ESGIC) 50-325-40 MG tablet TAKE 1 TO 2 TABLETS EVERY 6 HOURS AS NEEDED ONSET OF HEADACHE. MAX 6 TABS PER EVENT. MAX 30 A MONTH  . DimenhyDRINATE (MOTION SICKNESS RELIEF PO) Take 1-2 tablets by mouth at bedtime as needed (motion sickness).  Marland Kitchen esomeprazole (NEXIUM) 40 MG capsule Take 1 capsule (40 mg total) by mouth daily.  . fluticasone (FLONASE) 50 MCG/ACT nasal spray Place 2 sprays into both nostrils daily. (Patient taking differently: Place 2 sprays into both nostrils daily as needed for allergies. )  . folic acid (FOLVITE) 893 MCG tablet Take 800 mcg by  mouth at bedtime.   Marland Kitchen ibuprofen (ADVIL,MOTRIN) 600 MG tablet Take 1 tablet (600 mg total) by mouth every 6 (six) hours.  Marland Kitchen lamoTRIgine (LAMICTAL) 150 MG tablet Take 200 mg at bedtime by mouth.   . lurasidone (LATUDA) 20 MG TABS tablet Take 40 mg at bedtime by mouth.   . Melatonin 3 MG CAPS Take 6 mg by mouth at bedtime.   . montelukast (SINGULAIR) 10 MG tablet Take 1 tablet (10 mg total) at bedtime by mouth.  . Prenatal Vit-Fe Fumarate-FA (PRENATAL MULTIVITAMIN)  TABS tablet Take 1 tablet by mouth at bedtime.   Marland Kitchen PRESCRIPTION MEDICATION Take 10 mLs by mouth every 6 (six) hours. Swich and swallow every 6 hours  Dukes magic mouthwash   . vortioxetine HBr (TRINTELLIX) 20 MG TABS Take 20 mg daily by mouth.   No current facility-administered medications on file prior to visit.     ROS: Review of Systems  Constitutional: Negative for chills, diaphoresis, fever and malaise/fatigue.  HENT: Positive for congestion. Negative for ear discharge, ear pain, hearing loss, sinus pain, sore throat and tinnitus.   Eyes: Negative for blurred vision, pain, discharge and redness.  Respiratory: Positive for cough and sputum production. Negative for hemoptysis, shortness of breath, wheezing and stridor.   Cardiovascular: Negative for chest pain, palpitations and orthopnea.  Gastrointestinal: Negative for abdominal pain, diarrhea, nausea and vomiting.  Genitourinary: Negative.   Musculoskeletal: Negative for joint pain and myalgias.  Skin: Negative for rash.  Neurological: Negative for dizziness, sensory change, weakness and headaches.  Endo/Heme/Allergies: Negative for environmental allergies.  Psychiatric/Behavioral: Negative.   All other systems reviewed and are negative.    Physical Exam:  BP 110/72   Pulse 81   Temp (!) 97.5 F (36.4 C)   Ht 5\' 5"  (1.651 m)   Wt 184 lb (83.5 kg)   LMP 03/12/2012   SpO2 95%   BMI 30.62 kg/m   General Appearance: Well nourished, in no apparent distress. Eyes: PERRLA, EOMs, conjunctiva no swelling or erythema Sinuses: No Frontal/maxillary tenderness ENT/Mouth: Ext aud canals with soft wax bilaterally, TMs not visible. No erythema, swelling, or exudate on post pharynx.  Tonsils not swollen or erythematous. Hearing normal.  Neck: Supple Respiratory: Respiratory effort normal, BS without rales, but with scattered rhonchi, wheezing or stridor. Frequent reactive cough.  Cardio: RRR with no MRGs.  Abdomen: Soft, + BS.  Non  tender. Lymphatics: Non tender without lymphadenopathy.  Musculoskeletal: normal gait.  Skin: Warm, dry without rashes, lesions, ecchymosis.  Neuro: Normal muscle tone, no cerebellar symptoms.  Psych: Awake and oriented X 3, normal affect, Insight and Judgment appropriate.    Izora Ribas, NP 9:24 AM [facility]

## 2018-07-13 ENCOUNTER — Encounter: Payer: Self-pay | Admitting: Adult Health

## 2018-07-13 ENCOUNTER — Ambulatory Visit (INDEPENDENT_AMBULATORY_CARE_PROVIDER_SITE_OTHER): Admitting: Adult Health

## 2018-07-13 VITALS — BP 110/72 | HR 81 | Temp 97.5°F | Ht 65.0 in | Wt 184.0 lb

## 2018-07-13 DIAGNOSIS — Z23 Encounter for immunization: Secondary | ICD-10-CM

## 2018-07-13 DIAGNOSIS — J209 Acute bronchitis, unspecified: Secondary | ICD-10-CM | POA: Diagnosis not present

## 2018-07-13 MED ORDER — PROMETHAZINE-DM 6.25-15 MG/5ML PO SYRP: 5.0000 mL | ORAL_SOLUTION | Freq: Four times a day (QID) | ORAL | 1 refills | Status: DC | PRN

## 2018-07-13 MED ORDER — PREDNISONE 20 MG PO TABS: ORAL_TABLET | ORAL | 0 refills | Status: DC

## 2018-07-13 NOTE — Addendum Note (Signed)
Addended by: Chancy Hurter on: 07/13/2018 02:29 PM   Modules accepted: Orders

## 2018-07-13 NOTE — Patient Instructions (Signed)

## 2018-07-17 ENCOUNTER — Other Ambulatory Visit: Payer: Self-pay | Admitting: Adult Health

## 2018-07-17 ENCOUNTER — Telehealth: Payer: Self-pay

## 2018-07-17 MED ORDER — AZITHROMYCIN 250 MG PO TABS: ORAL_TABLET | ORAL | 1 refills | Status: AC

## 2018-07-17 MED ORDER — BENZONATATE 200 MG PO CAPS: ORAL_CAPSULE | ORAL | 1 refills | Status: DC

## 2018-07-17 MED ORDER — PREDNISONE 20 MG PO TABS: ORAL_TABLET | ORAL | 0 refills | Status: DC

## 2018-07-17 NOTE — Telephone Encounter (Signed)
Left message to call back  

## 2018-07-17 NOTE — Telephone Encounter (Signed)
Promethazine has not helped her cough. Can you prescribe something stronger for her. Also wants to know if should get a chest xray at this point? Please advise

## 2018-07-21 ENCOUNTER — Encounter: Payer: Self-pay | Admitting: Adult Health

## 2018-07-21 ENCOUNTER — Encounter: Payer: Self-pay | Admitting: Adult Health Nurse Practitioner

## 2018-07-21 ENCOUNTER — Ambulatory Visit (INDEPENDENT_AMBULATORY_CARE_PROVIDER_SITE_OTHER): Admitting: Adult Health Nurse Practitioner

## 2018-07-21 VITALS — BP 108/74 | HR 74 | Temp 97.5°F | Ht 65.0 in | Wt 178.0 lb

## 2018-07-21 DIAGNOSIS — R6889 Other general symptoms and signs: Secondary | ICD-10-CM | POA: Diagnosis not present

## 2018-07-21 DIAGNOSIS — E559 Vitamin D deficiency, unspecified: Secondary | ICD-10-CM

## 2018-07-21 DIAGNOSIS — I10 Essential (primary) hypertension: Secondary | ICD-10-CM

## 2018-07-21 DIAGNOSIS — Z1389 Encounter for screening for other disorder: Secondary | ICD-10-CM

## 2018-07-21 DIAGNOSIS — T7840XD Allergy, unspecified, subsequent encounter: Secondary | ICD-10-CM

## 2018-07-21 DIAGNOSIS — G43809 Other migraine, not intractable, without status migrainosus: Secondary | ICD-10-CM

## 2018-07-21 DIAGNOSIS — Z130 Encounter for screening for diseases of the blood and blood-forming organs and certain disorders involving the immune mechanism: Secondary | ICD-10-CM | POA: Diagnosis not present

## 2018-07-21 DIAGNOSIS — Z6829 Body mass index (BMI) 29.0-29.9, adult: Secondary | ICD-10-CM

## 2018-07-21 DIAGNOSIS — H16203 Unspecified keratoconjunctivitis, bilateral: Secondary | ICD-10-CM

## 2018-07-21 DIAGNOSIS — G4730 Sleep apnea, unspecified: Secondary | ICD-10-CM

## 2018-07-21 DIAGNOSIS — F3175 Bipolar disorder, in partial remission, most recent episode depressed: Secondary | ICD-10-CM

## 2018-07-21 DIAGNOSIS — Z79899 Other long term (current) drug therapy: Secondary | ICD-10-CM

## 2018-07-21 DIAGNOSIS — Z136 Encounter for screening for cardiovascular disorders: Secondary | ICD-10-CM

## 2018-07-21 DIAGNOSIS — D508 Other iron deficiency anemias: Secondary | ICD-10-CM

## 2018-07-21 DIAGNOSIS — K219 Gastro-esophageal reflux disease without esophagitis: Secondary | ICD-10-CM

## 2018-07-21 DIAGNOSIS — H6123 Impacted cerumen, bilateral: Secondary | ICD-10-CM | POA: Diagnosis not present

## 2018-07-21 DIAGNOSIS — Z0001 Encounter for general adult medical examination with abnormal findings: Secondary | ICD-10-CM | POA: Diagnosis not present

## 2018-07-21 DIAGNOSIS — R7309 Other abnormal glucose: Secondary | ICD-10-CM

## 2018-07-21 DIAGNOSIS — Z131 Encounter for screening for diabetes mellitus: Secondary | ICD-10-CM | POA: Diagnosis not present

## 2018-07-21 DIAGNOSIS — Z13220 Encounter for screening for lipoid disorders: Secondary | ICD-10-CM

## 2018-07-21 DIAGNOSIS — J209 Acute bronchitis, unspecified: Secondary | ICD-10-CM

## 2018-07-21 MED ORDER — CYCLOSPORINE 0.05 % OP EMUL: 1.0000 [drp] | Freq: Two times a day (BID) | OPHTHALMIC | 3 refills | Status: AC

## 2018-07-21 NOTE — Progress Notes (Signed)
Complete Physical  Assessment and Plan:  Bonnie Hall was seen today for annual exam.  Diagnoses and all orders for this visit:  Other migraine without status migrainosus, not intractable Doing well on current regiment continue with benefit  Bipolar disorder, in partial remission, most recent episode depressed ([place]) Doing well, continue current regiment Follows with Psychiatry -     CBC with Differential/Platelet -     COMPLETE METABOLIC PANEL WITH GFR  Sleep apnea, unspecified type Does not use this, reports cumbersome Provided education and discussed benefits  Abnormal glucose Discussed dietary and exercise modifications, provided education -     CBC with Differential/Platelet -     Hemoglobin A1c  Gastroesophageal reflux disease, esophagitis presence not specified Doing well continue current regiment -     Magnesium  Allergic state, subsequent encounter Doing well on prn medications  Acute bronchitis, unspecified organism Impvoed from last visit Finish full course of antibiotics  Bilateral impacted cerumen       Ear Lavage completed, bilateral without complications Bilateral canals cleared Discussed ear hygiene Stop using Q-tips May use OTC Oil to sooth ear canals as well as warm/room temp peroxide 3 min per side every couple weeks to prevent extreme build up. Education provided Contact office PRN  Vitamin D deficiency Continue supplementation will check level today -     VITAMIN D 25 Hydroxy (Vit-D Deficiency, Fractures)  Other iron deficiency anemia Not taking supplement at this time will check labs -     CBC with Differential/Platelet  Keratoconjunctivitis of both eyes -     cycloSPORINE (RESTASIS) 0.05 % ophthalmic emulsion; Place 1 drop into both eyes 2 (two) times daily.  BMI 29.0  Overweight Discussed dietary and exercise modifications Education provided.  Medication management -     CBC with Differential/Platelet -     COMPLETE METABOLIC PANEL  WITH GFR -     Hemoglobin A1c -     VITAMIN D 25 Hydroxy (Vit-D Deficiency, Fractures) -     Lipid panel -     TSH -     Magnesium -     Urinalysis, Routine w reflex microscopic -     Vitamin B12  Screening for cardiovascular condition -     CBC with Differential/Platelet -     Lipid panel -     EKG 12-Lead   Discussed med's effects and SE's. Screening labs and tests as requested with regular follow-up as recommended. Over 40 minutes of exam, counseling, chart review, and complex, high level critical decision making was performed this visit.   HPI  57 y.o. female  presents for a complete physical and follow up for has Iron deficiency anemia; Allergy; Sleep apnea; Migraine; Bipolar disorder (manic depression) ([place]); GERD (gastroesophageal reflux disease); Vitamin D deficiency; migraines  Medication management; Labile hypertension; Abnormal glucose; and follow up from acute bronchitis.  Reports that her symptoms have improved since er last visit  She has been using tessalon pearls for her cough and has one tablet left of the azithromyacin.  She denies any wheezing although reports that she does not have an up to date rescue inhaler.  She has CPAP machine and reports that she does not wear this as it is cumbersome and she is concerned she will not hear the children as she is sole caregiver for three children.  Reports she is doing well with her migraines and her last one was over two weeks ago.  She is taking fioricet 50-325-40mg  1-2 tablets Q6 PRN. She  has an 35 month old and is currently breast feeding.  She denies any challenges with this.  She denies any breast tenderness, redness itching or exudate.    Her blood pressure has been controlled at home, today their BP is BP: 108/74 She does not workout. She denies chest pain, shortness of breath, dizziness.   She is not on cholesterol medication and denies myalgias. Her cholesterol is not at goal. The cholesterol last visit was:   Lab  Results  Component Value Date   CHOL 213 (H) 07/21/2018   HDL 61 07/21/2018   LDLCALC 123 (H) 07/21/2018   TRIG 170 (H) 07/21/2018   CHOLHDL 3.5 07/21/2018    She has not been working on diet and exercise for prediabetes, she is on bASA, she is not on ACE/ARB and denies hyperglycemia, hypoglycemia , polydipsia and polyuria. Last A1C in the office was:  Lab Results  Component Value Date   HGBA1C 6.0 (H) 07/21/2018    Last GFR: Lab Results  Component Value Date   [facility] 95 07/21/2018   Lab Results  Component Value Date   GFRAA 110 07/21/2018    Patient is on Vitamin D supplement.   Lab Results  Component Value Date   VD25OH 34 07/21/2018      Current Medications:  Current Outpatient Medications on File Prior to Visit  Medication Sig Dispense Refill  . albuterol (VENTOLIN HFA) 108 (90 Base) MCG/ACT inhaler Inhale 2 puffs into the lungs every 4 (four) hours as needed for wheezing or shortness of breath. 1 Inhaler 0  . aspirin EC 81 MG tablet Take 1 tablet (81 mg total) by mouth daily. (Patient taking differently: Take 81 mg by mouth at bedtime. )    . benzonatate (TESSALON) 200 MG capsule Take 1 cap every 8 hours as needed for cough. 60 capsule 1  . butalbital-acetaminophen-caffeine (FIORICET, ESGIC) 50-325-40 MG tablet TAKE 1 TO 2 TABLETS EVERY 6 HOURS AS NEEDED ONSET OF HEADACHE. MAX 6 TABS PER EVENT. MAX 30 A MONTH 30 tablet 0  . DimenhyDRINATE (MOTION SICKNESS RELIEF PO) Take 1-2 tablets by mouth at bedtime as needed (motion sickness).    Marland Kitchen esomeprazole (NEXIUM) 40 MG capsule Take 1 capsule (40 mg total) by mouth daily. 90 capsule 1  . fluticasone (FLONASE) 50 MCG/ACT nasal spray Place 2 sprays into both nostrils daily. (Patient taking differently: Place 2 sprays into both nostrils daily as needed for allergies. ) 16 g 2  . folic acid (FOLVITE) 924 MCG tablet Take 800 mcg by mouth at bedtime.     . lamoTRIgine (LAMICTAL) 150 MG tablet Take 200 mg at bedtime by mouth.      . lurasidone (LATUDA) 20 MG TABS tablet Take 40 mg at bedtime by mouth.     . Melatonin 3 MG CAPS Take 6 mg by mouth at bedtime.     . montelukast (SINGULAIR) 10 MG tablet Take 1 tablet (10 mg total) at bedtime by mouth. 90 tablet 3  . predniSONE (DELTASONE) 20 MG tablet 2 tablets daily for 3 days, 1 tablet daily for 4 days. 10 tablet 0  . PRESCRIPTION MEDICATION Take 10 mLs by mouth every 6 (six) hours. Swich and swallow every 6 hours  Dukes magic mouthwash     . promethazine-dextromethorphan (PROMETHAZINE-DM) 6.25-15 MG/5ML syrup Take 5 mLs by mouth 4 (four) times daily as needed for cough. 240 mL 1  . vortioxetine HBr (TRINTELLIX) 20 MG TABS Take 20 mg daily by mouth.    Marland Kitchen  ibuprofen (ADVIL,MOTRIN) 600 MG tablet Take 1 tablet (600 mg total) by mouth every 6 (six) hours. (Patient not taking: Reported on 07/21/2018) 30 tablet 0   No current facility-administered medications on file prior to visit.    Allergies:  No Known Allergies Medical History:  She has Iron deficiency anemia; Allergy; Sleep apnea; Migraine; Bipolar disorder (manic depression) ([place]); GERD (gastroesophageal reflux disease); Vitamin D deficiency; Medication management; Labile hypertension; Abnormal glucose; and S/P cesarean section on their problem list. Health Maintenance:   Immunization History  Administered Date(s) Administered  . Influenza Inj Mdck Quad With Preservative 07/13/2018  . Influenza Split 07/10/2013, 07/04/2014, 06/17/2015  . PPD Test 01/14/2014, 01/16/2015  . Pneumococcal Polysaccharide-23 12/31/2011  . Tdap 12/28/2010   Surgical History:  She has a past surgical history that includes Nasal septum surgery (1983); Tonsillectomy (1966); Cesarean section (2009); Colonoscopy (06/2012); Gynecologic cryosurgery; and Cesarean section (N/A, 03/09/2017). Family History:  Herfamily history includes Breast cancer in her paternal aunt; Colon cancer (age of onset: 75) in her paternal uncle; Colon cancer (age of  onset: 79) in her paternal uncle; Hearing loss in her daughter; Prostate cancer in her father; Stroke in her maternal grandfather and maternal grandmother. Social History:  She reports that she has never smoked. She has never used smokeless tobacco. She reports that she does not drink alcohol or use drugs.  Review of Systems: Review of Systems  Constitutional: Negative for chills, diaphoresis, fever, malaise/fatigue and weight loss.  HENT: Positive for congestion. Negative for ear discharge, ear pain, hearing loss, nosebleeds, sinus pain, sore throat and tinnitus.   Eyes: Negative for blurred vision, double vision, photophobia, pain, discharge and redness.  Respiratory: Positive for cough. Negative for hemoptysis, sputum production, shortness of breath, wheezing and stridor.   Cardiovascular: Negative for chest pain, palpitations, orthopnea, claudication, leg swelling and PND.  Gastrointestinal: Negative for abdominal pain, blood in stool, constipation, diarrhea, heartburn, melena, nausea and vomiting.  Genitourinary: Negative for dysuria, flank pain, frequency, hematuria and urgency.  Musculoskeletal: Negative for back pain, falls, joint pain, myalgias and neck pain.  Skin: Negative for itching and rash.  Neurological: Negative for dizziness, tingling, tremors, sensory change, speech change, focal weakness, seizures, loss of consciousness, weakness and headaches.  Endo/Heme/Allergies: Negative for environmental allergies and polydipsia. Does not bruise/bleed easily.  Psychiatric/Behavioral: Negative for depression, hallucinations, memory loss, substance abuse and suicidal ideas. The patient is not nervous/anxious and does not have insomnia.     Physical Exam: Estimated body mass index is 29.62 kg/m as calculated from the following:   Height as of this encounter: 5\' 5"  (1.651 m).   Weight as of this encounter: 178 lb (80.7 kg). BP 108/74   Pulse 74   Temp (!) 97.5 F (36.4 C)   Ht 5\' 5"   (1.651 m)   Wt 178 lb (80.7 kg)   LMP 03/12/2012   SpO2 95%   BMI 29.62 kg/m  General Appearance: Well nourished, in no apparent distress.  Eyes: PERRLA, EOMs, conjunctiva no swelling or erythema, normal fundi and vessels.  Sinuses: No Frontal/maxillary tenderness  ENT/Mouth: Ext aud canals bilateral cerumen impaction.  Good dentition. No erythema, swelling, or exudate on post pharynx. Tonsils not swollen or erythematous. Hearing normal.  Neck: Supple, thyroid normal. No bruits  Respiratory: Respiratory effort normal, BS equal bilaterally without rales, rhonchi, wheezing or stridor.  Cardio: RRR without murmurs, rubs or gallops. Brisk peripheral pulses without edema.  Chest: symmetric, with normal excursions and percussion.  Breasts: Symmetric, without lumps, nipple  discharge, retractions.  Abdomen: Soft, nontender, no guarding, rebound, hernias, masses, or organomegaly.  Lymphatics: Non tender without lymphadenopathy.  Genitourinary:  Musculoskeletal: Full ROM all peripheral extremities,5/5 strength, and normal gait.  Skin: Warm, dry without rashes, lesions, ecchymosis. Neuro: Cranial nerves intact, reflexes equal bilaterally. Normal muscle tone, no cerebellar symptoms. Sensation intact.  Psych: Awake and oriented X 3, normal affect, Insight and Judgment appropriate.   EKG: WNL no ST changes. AORTA SCAN:  Deferred    Names of Other Physician/Practitioners you currently use: 1. [facility] here for primary care 2. Eye Exam 2019 3 Dentist 2019 Patient Care Team: Unk Pinto, MD as PCP - General (Internal Medicine) Dian Queen, MD as Attending Physician (Obstetrics and Gynecology) Rodell Perna, NP as Nurse Practitioner (Psychiatry)    Screening Tests: Immunization History  Administered Date(s) Administered  . Influenza Inj Mdck Quad With Preservative 07/13/2018  . Influenza Split 07/10/2013, 07/04/2014, 06/17/2015  . PPD  Test 01/14/2014, 01/16/2015  . Pneumococcal Polysaccharide-23 12/31/2011  . Tdap 12/28/2010    Preventative care: Last colonoscopy: 2005 Last mammogram: Due currently Breast feeding, 51months after stopping this Last pap smear/pelvic exam: 2019     Vaccinations: TD or Tdap: 2018  Influenza: 2019 Pneumococcal: 2013 Prevnar13:  Shingles/Zostavax: Due, discussed with patient  Garnet Sierras, DNP 8:48 AM [facility]

## 2018-07-21 NOTE — Telephone Encounter (Signed)
Patient still has the persistent cough but feels better.

## 2018-07-21 NOTE — Patient Instructions (Addendum)
We provided ear lavage today to clear wax from both your ears. You may purchase ear oil for your ears to help sooth ear canal if sore. You may use cap full of room temperature/warm peroxide in your ears every two weeks.  Do one ear at a time and allow peroxide to sit in canal for at least 76min.  This may help decrease wax buildup.  Increase exercise, walking can include children in this  We will check labs today and contact you next week with results.  I have sent in lalbuteral inhaler to use as needed for wheezing or shortness of breath and restatsis for dry eyes.  Follow up in 6 months for labs and medication mangement  Cerumen impaction - stop using Qtips, use OTC drops/oil, if not better come to the office for irrigation  Earwax Buildup, Adult The ears produce a substance called earwax that helps keep bacteria out of the ear and protects the skin in the ear canal. Occasionally, earwax can build up in the ear and cause discomfort or hearing loss. What increases the risk? This condition is more likely to develop in people who:  Are female.  Are elderly.  Naturally produce more earwax.  Clean their ears often with cotton swabs.  Use earplugs often.  Use in-ear headphones often.  Wear hearing aids.  Have narrow ear canals.  Have earwax that is overly thick or sticky.  Have eczema.  Are dehydrated.  Have excess hair in the ear canal.  What are the signs or symptoms? Symptoms of this condition include:  Reduced or muffled hearing.  A feeling of fullness in the ear or feeling that the ear is plugged.  Fluid coming from the ear.  Ear pain.  Ear itch.  Ringing in the ear.  Coughing.  An obvious piece of earwax that can be seen inside the ear canal.  How is this diagnosed? This condition may be diagnosed based on:  Your symptoms.  Your medical history.  An ear exam. During the exam, your health care provider will look into your ear with an instrument  called an otoscope.  You may have tests, including a hearing test. How is this treated? This condition may be treated by:  Using ear drops to soften the earwax.  Having the earwax removed by a health care provider. The health care provider may: ? Flush the ear with water. ? Use an instrument that has a loop on the end (curette). ? Use a suction device.  Surgery to remove the wax buildup. This may be done in severe cases.  Follow these instructions at home:  Take over-the-counter and prescription medicines only as told by your health care provider.  Do not put any objects, including cotton swabs, into your ear. You can clean the opening of your ear canal with a washcloth or facial tissue.  Follow instructions from your health care provider about cleaning your ears. Do not over-clean your ears.  Drink enough fluid to keep your urine clear or pale yellow. This will help to thin the earwax.  Keep all follow-up visits as told by your health care provider. If earwax builds up in your ears often or if you use hearing aids, consider seeing your health care provider for routine, preventive ear cleanings. Ask your health care provider how often you should schedule your cleanings.  If you have hearing aids, clean them according to instructions from the manufacturer and your health care provider. Contact a health care provider if:  You have ear pain.  You develop a fever.  You have blood, pus, or other fluid coming from your ear.  You have hearing loss.  You have ringing in your ears that does not go away.  Your symptoms do not improve with treatment.  You feel like the room is spinning (vertigo). Summary  Earwax can build up in the ear and cause discomfort or hearing loss.  The most common symptoms of this condition include reduced or muffled hearing and a feeling of fullness in the ear or feeling that the ear is plugged.  This condition may be diagnosed based on your symptoms,  your medical history, and an ear exam.  This condition may be treated by using ear drops to soften the earwax or by having the earwax removed by a health care provider.  Do not put any objects, including cotton swabs, into your ear. You can clean the opening of your ear canal with a washcloth or facial tissue. This information is not intended to replace advice given to you by your health care provider. Make sure you discuss any questions you have with your health care provider. Document Released: 10/07/2004 Document Revised: 11/10/2016 Document Reviewed: 11/10/2016 Elsevier Interactive Patient Education  2018 [employer].         Breastfeeding Choosing to breastfeed is one of the best decisions you can make for yourself and your baby. A change in hormones during pregnancy causes your breasts to make breast milk in your milk-producing glands. Hormones prevent breast milk from being released before your baby is born. They also prompt milk flow after birth. Once breastfeeding has begun, thoughts of your baby, as well as his or her sucking or crying, can stimulate the release of milk from your milk-producing glands. Benefits of breastfeeding Research shows that breastfeeding offers many health benefits for infants and mothers. It also offers a cost-free and convenient way to feed your baby. For your baby  Your first milk (colostrum) helps your baby's digestive system to function better.  Special cells in your milk (antibodies) help your baby to fight off infections.  Breastfed babies are less likely to develop asthma, allergies, obesity, or type 2 diabetes. They are also at lower risk for sudden infant death syndrome (SIDS).  Nutrients in breast milk are better able to meet your baby's needs compared to infant formula.  Breast milk improves your baby's brain development. For you  Breastfeeding helps to create a very special bond between you and your baby.  Breastfeeding is  convenient. Breast milk costs nothing and is always available at the correct temperature.  Breastfeeding helps to burn calories. It helps you to lose the weight that you gained during pregnancy.  Breastfeeding makes your uterus return faster to its size before pregnancy. It also slows bleeding (lochia) after you give birth.  Breastfeeding helps to lower your risk of developing type 2 diabetes, osteoporosis, rheumatoid arthritis, cardiovascular disease, and breast, ovarian, uterine, and endometrial cancer later in life. Breastfeeding basics Starting breastfeeding  Find a comfortable place to sit or lie down, with your neck and back well-supported.  Place a pillow or a rolled-up blanket under your baby to bring him or her to the level of your breast (if you are seated). Nursing pillows are specially designed to help support your arms and your baby while you breastfeed.  Make sure that your baby's tummy (abdomen) is facing your abdomen.  Gently massage your breast. With your fingertips, massage from the outer edges of your  breast inward toward the nipple. This encourages milk flow. If your milk flows slowly, you may need to continue this action during the feeding.  Support your breast with 4 fingers underneath and your thumb above your nipple (make the letter "C" with your hand). Make sure your fingers are well away from your nipple and your baby's mouth.  Stroke your baby's lips gently with your finger or nipple.  When your baby's mouth is open wide enough, quickly bring your baby to your breast, placing your entire nipple and as much of the areola as possible into your baby's mouth. The areola is the colored area around your nipple. ? More areola should be visible above your baby's upper lip than below the lower lip. ? Your baby's lips should be opened and extended outward (flanged) to ensure an adequate, comfortable latch. ? Your baby's tongue should be between his or her lower gum and your  breast.  Make sure that your baby's mouth is correctly positioned around your nipple (latched). Your baby's lips should create a seal on your breast and be turned out (everted).  It is common for your baby to suck about 2-3 minutes in order to start the flow of breast milk. Latching Teaching your baby how to latch onto your breast properly is very important. An improper latch can cause nipple pain, decreased milk supply, and poor weight gain in your baby. Also, if your baby is not latched onto your nipple properly, he or she may swallow some air during feeding. This can make your baby fussy. Burping your baby when you switch breasts during the feeding can help to get rid of the air. However, teaching your baby to latch on properly is still the best way to prevent fussiness from swallowing air while breastfeeding. Signs that your baby has successfully latched onto your nipple  Silent tugging or silent sucking, without causing you pain. Infant's lips should be extended outward (flanged).  Swallowing heard between every 3-4 sucks once your milk has started to flow (after your let-down milk reflex occurs).  Muscle movement above and in front of his or her ears while sucking.  Signs that your baby has not successfully latched onto your nipple  Sucking sounds or smacking sounds from your baby while breastfeeding.  Nipple pain.  If you think your baby has not latched on correctly, slip your finger into the corner of your baby's mouth to break the suction and place it between your baby's gums. Attempt to start breastfeeding again. Signs of successful breastfeeding Signs from your baby  Your baby will gradually decrease the number of sucks or will completely stop sucking.  Your baby will fall asleep.  Your baby's body will relax.  Your baby will retain a small amount of milk in his or her mouth.  Your baby will let go of your breast by himself or herself.  Signs from you  Breasts that  have increased in firmness, weight, and size 1-3 hours after feeding.  Breasts that are softer immediately after breastfeeding.  Increased milk volume, as well as a change in milk consistency and color by the fifth day of breastfeeding.  Nipples that are not sore, cracked, or bleeding.  Signs that your baby is getting enough milk  Wetting at least 1-2 diapers during the first 24 hours after birth.  Wetting at least 5-6 diapers every 24 hours for the first week after birth. The urine should be clear or pale yellow by the age of 5 days.  Wetting 6-8 diapers every 24 hours as your baby continues to grow and develop.  At least 3 stools in a 24-hour period by the age of 5 days. The stool should be soft and yellow.  At least 3 stools in a 24-hour period by the age of 7 days. The stool should be seedy and yellow.  No loss of weight greater than 10% of birth weight during the first 3 days of life.  Average weight gain of 4-7 oz (113-198 g) per week after the age of 4 days.  Consistent daily weight gain by the age of 5 days, without weight loss after the age of 2 weeks. After a feeding, your baby may spit up a small amount of milk. This is normal. Breastfeeding frequency and duration Frequent feeding will help you make more milk and can prevent sore nipples and extremely full breasts (breast engorgement). Breastfeed when you feel the need to reduce the fullness of your breasts or when your baby shows signs of hunger. This is called "breastfeeding on demand." Signs that your baby is hungry include:  Increased alertness, activity, or restlessness.  Movement of the head from side to side.  Opening of the mouth when the corner of the mouth or cheek is stroked (rooting).  Increased sucking sounds, smacking lips, cooing, sighing, or squeaking.  Hand-to-mouth movements and sucking on fingers or hands.  Fussing or crying.  Avoid introducing a pacifier to your baby in the first 4-6 weeks after  your baby is born. After this time, you may choose to use a pacifier. Research has shown that pacifier use during the first year of a baby's life decreases the risk of sudden infant death syndrome (SIDS). Allow your baby to feed on each breast as long as he or she wants. When your baby unlatches or falls asleep while feeding from the first breast, offer the second breast. Because newborns are often sleepy in the first few weeks of life, you may need to awaken your baby to get him or her to feed. Breastfeeding times will vary from baby to baby. However, the following rules can serve as a guide to help you make sure that your baby is properly fed:  Newborns (babies 23 weeks of age or younger) may breastfeed every 1-3 hours.  Newborns should not go without breastfeeding for longer than 3 hours during the day or 5 hours during the night.  You should breastfeed your baby a minimum of 8 times in a 24-hour period.  Breast milk pumping Pumping and storing breast milk allows you to make sure that your baby is exclusively fed your breast milk, even at times when you are unable to breastfeed. This is especially important if you go back to work while you are still breastfeeding, or if you are not able to be present during feedings. Your lactation consultant can help you find a method of pumping that works best for you and give you guidelines about how long it is safe to store breast milk. Caring for your breasts while you breastfeed Nipples can become dry, cracked, and sore while breastfeeding. The following recommendations can help keep your breasts moisturized and healthy:  Avoid using soap on your nipples.  Wear a supportive bra designed especially for nursing. Avoid wearing underwire-style bras or extremely tight bras (sports bras).  Air-dry your nipples for 3-4 minutes after each feeding.  Use only cotton bra pads to absorb leaked breast milk. Leaking of breast milk between feedings is normal.  Use  lanolin on your nipples after breastfeeding. Lanolin helps to maintain your skin's normal moisture barrier. Pure lanolin is not harmful (not toxic) to your baby. You may also hand express a few drops of breast milk and gently massage that milk into your nipples and allow the milk to air-dry.  In the first few weeks after giving birth, some women experience breast engorgement. Engorgement can make your breasts feel heavy, warm, and tender to the touch. Engorgement peaks within 3-5 days after you give birth. The following recommendations can help to ease engorgement:  Completely empty your breasts while breastfeeding or pumping. You may want to start by applying warm, moist heat (in the shower or with warm, water-soaked hand towels) just before feeding or pumping. This increases circulation and helps the milk flow. If your baby does not completely empty your breasts while breastfeeding, pump any extra milk after he or she is finished.  Apply ice packs to your breasts immediately after breastfeeding or pumping, unless this is too uncomfortable for you. To do this: ? Put ice in a plastic bag. ? Place a towel between your skin and the bag. ? Leave the ice on for 20 minutes, 2-3 times a day.  Make sure that your baby is latched on and positioned properly while breastfeeding.  If engorgement persists after 48 hours of following these recommendations, contact your health care provider or a Science writer. Overall health care recommendations while breastfeeding  Eat 3 healthy meals and 3 snacks every day. Well-nourished mothers who are breastfeeding need an additional 450-500 calories a day. You can meet this requirement by increasing the amount of a balanced diet that you eat.  Drink enough water to keep your urine pale yellow or clear.  Rest often, relax, and continue to take your prenatal vitamins to prevent fatigue, stress, and low vitamin and mineral levels in your body (nutrient  deficiencies).  Do not use any products that contain nicotine or tobacco, such as cigarettes and e-cigarettes. Your baby may be harmed by chemicals from cigarettes that pass into breast milk and exposure to secondhand smoke. If you need help quitting, ask your health care provider.  Avoid alcohol.  Do not use illegal drugs or marijuana.  Talk with your health care provider before taking any medicines. These include over-the-counter and prescription medicines as well as vitamins and herbal supplements. Some medicines that may be harmful to your baby can pass through breast milk.  It is possible to become pregnant while breastfeeding. If birth control is desired, ask your health care provider about options that will be safe while breastfeeding your baby. Where to find more information: [employer]: www.llli.org Contact a health care provider if:  You feel like you want to stop breastfeeding or have become frustrated with breastfeeding.  Your nipples are cracked or bleeding.  Your breasts are red, tender, or warm.  You have: ? Painful breasts or nipples. ? A swollen area on either breast. ? A fever or chills. ? Nausea or vomiting. ? Drainage other than breast milk from your nipples.  Your breasts do not become full before feedings by the fifth day after you give birth.  You feel sad and depressed.  Your baby is: ? Too sleepy to eat well. ? Having trouble sleeping. ? More than 49 week old and wetting fewer than 6 diapers in a 24-hour period. ? Not gaining weight by 80 days of age.  Your baby has fewer than 3 stools in a 24-hour period.  Your baby's skin or the white parts of his or her eyes become yellow. Get help right away if:  Your baby is overly tired (lethargic) and does not want to wake up and feed.  Your baby develops an unexplained fever. Summary  Breastfeeding offers many health benefits for infant and mothers.  Try to breastfeed your infant when  he or she shows early signs of hunger.  Gently tickle or stroke your baby's lips with your finger or nipple to allow the baby to open his or her mouth. Bring the baby to your breast. Make sure that much of the areola is in your baby's mouth. Offer one side and burp the baby before you offer the other side.  Talk with your health care provider or lactation consultant if you have questions or you face problems as you breastfeed. This information is not intended to replace advice given to you by your health care provider. Make sure you discuss any questions you have with your health care provider. Document Released: 08/30/2005 Document Revised: 10/01/2016 Document Reviewed: 10/01/2016 Elsevier Interactive Patient Education  2018 [employer]  Know what a healthy weight is for you (roughly BMI <25) and aim to maintain this  Aim for 7+ servings of fruits and vegetables daily  70-80+ fluid ounces of water or unsweet tea for healthy kidneys  Limit to max 1 drink of alcohol per day; avoid smoking/tobacco  Limit animal fats in diet for cholesterol and heart health - choose grass fed whenever available  Avoid highly processed foods, and foods high in saturated/trans fats  Aim for low stress - take time to unwind and care for your mental health  Aim for 150 min of moderate intensity exercise weekly for heart health, and weights twice weekly for bone health  Aim for 7-9 hours of sleep daily

## 2018-07-22 LAB — LIPID PANEL
Cholesterol: 213 mg/dL — ABNORMAL HIGH (ref <200)
HDL: 61 mg/dL (ref >50)
LDL Cholesterol (Calc): 123 mg/dL (calc) — ABNORMAL HIGH
Non-HDL Cholesterol (Calc): 152 mg/dL (calc) — ABNORMAL HIGH (ref <130)
Total CHOL/HDL Ratio: 3.5 (calc) (ref <5.0)
Triglycerides: 170 mg/dL — ABNORMAL HIGH (ref <150)

## 2018-07-22 LAB — URINALYSIS, ROUTINE W REFLEX MICROSCOPIC
Bacteria, UA: NONE SEEN /HPF
Bilirubin Urine: NEGATIVE
Glucose, UA: NEGATIVE
Hyaline Cast: NONE SEEN /LPF
Ketones, ur: NEGATIVE
Leukocytes, UA: NEGATIVE
Nitrite: NEGATIVE
Protein, ur: NEGATIVE
Specific Gravity, Urine: 1.03 (ref 1.001–1.03)
Squamous Epithelial / HPF: NONE SEEN /HPF (ref <=5)
WBC, UA: NONE SEEN /HPF (ref 0–5)
pH: <=5 (ref 5.0–8.0)

## 2018-07-22 LAB — COMPLETE METABOLIC PANEL WITH GFR
AG Ratio: 1.6 (calc) (ref 1.0–2.5)
ALT: 14 U/L (ref 6–29)
AST: 12 U/L (ref 10–35)
Albumin: 4.2 g/dL (ref 3.6–5.1)
Alkaline phosphatase (APISO): 100 U/L (ref 33–130)
BUN/Creatinine Ratio: 39 (calc) — ABNORMAL HIGH (ref 6–22)
BUN: 28 mg/dL — ABNORMAL HIGH (ref 7–25)
CO2: 29 mmol/L (ref 20–32)
Calcium: 9.6 mg/dL (ref 8.6–10.4)
Chloride: 104 mmol/L (ref 98–110)
Creat: 0.71 mg/dL (ref 0.50–1.05)
GFR, Est African American: 110 mL/min/{1.73_m2} (ref >=60)
GFR, Est Non African American: 95 mL/min/{1.73_m2} (ref >=60)
Globulin: 2.7 g/dL (calc) (ref 1.9–3.7)
Glucose, Bld: 110 mg/dL — ABNORMAL HIGH (ref 65–99)
Potassium: 4.8 mmol/L (ref 3.5–5.3)
Sodium: 139 mmol/L (ref 135–146)
Total Bilirubin: 0.3 mg/dL (ref 0.2–1.2)
Total Protein: 6.9 g/dL (ref 6.1–8.1)

## 2018-07-22 LAB — CBC WITH DIFFERENTIAL/PLATELET
Basophils Absolute: 78 cells/uL (ref 0–200)
Basophils Relative: 1 %
Eosinophils Absolute: 250 cells/uL (ref 15–500)
Eosinophils Relative: 3.2 %
HCT: 42.2 % (ref 35.0–45.0)
Hemoglobin: 14.4 g/dL (ref 11.7–15.5)
Lymphs Abs: 1669 cells/uL (ref 850–3900)
MCH: 31.2 pg (ref 27.0–33.0)
MCHC: 34.1 g/dL (ref 32.0–36.0)
MCV: 91.5 fL (ref 80.0–100.0)
MPV: 11 fL (ref 7.5–12.5)
Monocytes Relative: 7.6 %
Neutro Abs: 5210 cells/uL (ref 1500–7800)
Neutrophils Relative %: 66.8 %
Platelets: 611 10*3/uL — ABNORMAL HIGH (ref 140–400)
RBC: 4.61 10*6/uL (ref 3.80–5.10)
RDW: 13.1 % (ref 11.0–15.0)
Total Lymphocyte: 21.4 %
WBC mixed population: 593 cells/uL (ref 200–950)
WBC: 7.8 10*3/uL (ref 3.8–10.8)

## 2018-07-22 LAB — HEMOGLOBIN A1C
Hgb A1c MFr Bld: 6 % of total Hgb — ABNORMAL HIGH (ref <5.7)
Mean Plasma Glucose: 126 (calc)
eAG (mmol/L): 7 (calc)

## 2018-07-22 LAB — TSH: TSH: 1.09 mIU/L (ref 0.40–4.50)

## 2018-07-22 LAB — MAGNESIUM: Magnesium: 2.1 mg/dL (ref 1.5–2.5)

## 2018-07-22 LAB — VITAMIN B12: Vitamin B-12: 695 pg/mL (ref 200–1100)

## 2018-07-22 LAB — VITAMIN D 25 HYDROXY (VIT D DEFICIENCY, FRACTURES): Vit D, 25-Hydroxy: 34 ng/mL (ref 30–100)

## 2018-07-24 ENCOUNTER — Encounter: Payer: Self-pay | Admitting: Adult Health Nurse Practitioner

## 2018-07-24 MED ORDER — FLUTICASONE PROPIONATE 50 MCG/ACT NA SUSP: 2.0000 | Freq: Every day | NASAL | 5 refills | Status: DC | PRN

## 2018-09-19 ENCOUNTER — Other Ambulatory Visit: Payer: Self-pay

## 2018-09-19 DIAGNOSIS — G44219 Episodic tension-type headache, not intractable: Secondary | ICD-10-CM

## 2018-09-19 NOTE — Telephone Encounter (Signed)
Fioricet refill request

## 2018-09-20 MED ORDER — BUTALBITAL-APAP-CAFFEINE 50-325-40 MG PO TABS: ORAL_TABLET | ORAL | 1 refills | Status: DC

## 2018-12-05 ENCOUNTER — Other Ambulatory Visit: Payer: Self-pay

## 2018-12-05 MED ORDER — MONTELUKAST SODIUM 10 MG PO TABS: 10.0000 mg | ORAL_TABLET | Freq: Every day | ORAL | 3 refills | Status: DC

## 2018-12-05 NOTE — Telephone Encounter (Signed)
Montelukast refill request.

## 2019-03-21 ENCOUNTER — Telehealth: Payer: Self-pay

## 2019-03-21 ENCOUNTER — Other Ambulatory Visit: Payer: Self-pay | Admitting: Adult Health

## 2019-03-21 ENCOUNTER — Other Ambulatory Visit: Payer: Self-pay

## 2019-03-21 DIAGNOSIS — G44219 Episodic tension-type headache, not intractable: Secondary | ICD-10-CM

## 2019-03-21 MED ORDER — BUTALBITAL-APAP-CAFFEINE 50-325-40 MG PO TABS: ORAL_TABLET | ORAL | 1 refills | Status: DC

## 2019-03-21 NOTE — Telephone Encounter (Signed)
Spoke [facility] & the Rx can be faxed  So I reprinted & placed on the provider's desk in hopes to get it signed & faxed to pharmacy.

## 2019-03-21 NOTE — Telephone Encounter (Signed)
-----   Message from Liane Comber, NP sent at 03/21/2019 12:11 PM EDT ----- Regarding: RE: med refill Contact: [phone] Please advise patient [facility] doesn't appear to accept e scripts for controlled substances - ok to try to call in order if they would accept by phone, or alternately will refill to local [facility] instead. Thanks ----- Message ----- From: Elenor Quinones, CMA Sent: 03/21/2019  10:21 AM EDT To: Liane Comber, NP Subject: med refill                                     PER OFFICE NOTE   Patient would like a refill on FIORCET sent to [place]

## 2019-07-26 ENCOUNTER — Encounter: Payer: Self-pay | Admitting: Adult Health

## 2019-07-31 DIAGNOSIS — E663 Overweight: Secondary | ICD-10-CM | POA: Insufficient documentation

## 2019-07-31 DIAGNOSIS — E782 Mixed hyperlipidemia: Secondary | ICD-10-CM | POA: Insufficient documentation

## 2019-07-31 NOTE — Progress Notes (Signed)
Complete Physical  Assessment and Plan:  Encounter for routine adult health examination with abnormal findings  Labile hypertension Continue medication Monitor blood pressure at home; call if consistently over 130/80 Continue DASH diet.   Reminder to go to the ER if any CP, SOB, nausea, dizziness, severe HA, changes vision/speech, left arm numbness and tingling and jaw pain. -     Magnesium  Gastroesophageal reflux disease, esophagitis presence not specified Continue with PPI for now due to increased reflux related to recent pregnancy; discussed trial reduction of PPI with addition of H2 blocker; patient would like to postpone for now -     Magnesium  Other migraine without status migrainosus, not intractable -     Magnesium  Other iron deficiency anemia -     CBC with Differential/Platelet  Allergic state, subsequent encounter       -      Continue meds; avoid triggers  Bipolar disorder in partial remission, most recent episode depressed ([place])       -      Continue medications, follow up with Dr. Pauline Good and [facility] mood disorder clinic  Abnormal glucose -     Hemoglobin A1c  Vitamin D deficiency -     VITAMIN D 25 Hydroxy (Vit-D Deficiency, Fractures)  Medication management -     CBC with Differential/Platelet -     CMP/GFR -     Magnesium  -     UA  Cholesterol Currently mild elevations managed by lifestyle modification Continue low cholesterol diet and exercise.  Check lipid panel.  -      Lipid panel -      TSH (defer as just had at GYN and reports normal)  Screening for deficiency anemia  -     CBC with Differential/Platelet  Screening for hematuria or proteinuria -     Urinalysis, Complete (81001)  Screening for thyroid disorder -     TSH  Myalgias/family history of RA/weight loss Associates with recent weight loss following stress/divorce/single parent caring for 3 kids Has myalgias shoulders/biceps, ROM and strength intact Will check  CBC, CMP/GFR, CK, CRP, ESR, ANA, RA, antiDNA-DS due to family history of RA  to r/o auto immune but expect this will be negative as no other typical accompaniments, fatigue likely r/t stress/lack of sleep/insufficient caloric intake which she admits to Reviewed high protein/calorie diet, cautioned if declining further despite increased intake to follow up separately    Discussed med's effects and SE's. Screening labs and tests as requested with regular follow-up as recommended. Over 40 minutes of exam, counseling, chart review, and complex, high level critical decision making was performed this visit.   Future Appointments  Date Time Provider [place]  01/29/2020  2:30 PM Liane Comber, NP [facility] None  08/04/2020  3:00 PM Liane Comber, NP [facility] None     HPI  58 y.o. female  presents for a complete physical and follow up; she has Allergy; Sleep apnea; Migraine; Bipolar disorder (manic depression) ([place]); GERD (gastroesophageal reflux disease); Vitamin D deficiency; Medication management; Labile hypertension; Abnormal glucose; S/P cesarean section; Overweight (BMI 25.0-29.9); and Mixed hyperlipidemia on their problem list.   She is newly separated since the covid pandemic, 3 children,  71 y/o twins (lots of stress with remote learning), youngest in 57 years old. She is homemaker.  Family is in [employer]. Trying to sell home and this is stessful.  Has lost a lot of weight due to stress, admits she is so  busy and stressed she isn't eating as much, trying to get in 2 meals a day but struggling. Hoping to be able to pack and move to [facility] once she has time to pack when kids are in school.   Follows GYN - had appointment last week, had normal PAP, reports TSH was normal.  She reports myalgia, stiffness and aching mainly in shoulders and upper arms, limiting ROM/discomfort. PGM had RA. She denies other joint stiffness, pain, rash.   She has CPAP machine and reports that  she does not wear this as it is cumbersome and she is concerned she will not hear the children as she is sole caregiver for three children.   She is followed by Dr. Santiago Glad Jones/ Terisa Starr for Bipolar depression/postpartum depression, followed by [facility] mood clinic. She is following with a counselor and finds this helpful. Currently on trintellix, latuda, lamictal and PRN xanax (PRN during the day, trying to limit 1-2/week)/klonazepam (daily in the evening), Buspar, taking daily.    She reports she has been seeing a chiropractor for HA and postpartum alignments which have improved HAs somewhat thought she continues to experience HA (bil, starts in neck, with photosensitivity, no nausea or aura) due to stress, 2-3/week. She is taking fioricet 50-325-16m 1-2 tablets for this which typically resolves headache within 20 min. She is trying to limit fioricet frequency  She reports reflux is well controlled with nexium.    Has allergies improved with singular, has albuterol but hasn't needed in years.   BMI is Body mass index is 23.66 kg/m., she has not been working on diet and exercise. Wt Readings from Last 3 Encounters:  08/01/19 140 lb (63.5 kg)  07/21/18 178 lb (80.7 kg)  07/13/18 184 lb (83.5 kg)   Today their BP is BP: 108/70 She does not workout. She denies chest pain, shortness of breath, dizziness.   She is not on cholesterol medication and denies myalgias. Her cholesterol is not at goal. The cholesterol last visit was:   Lab Results  Component Value Date   CHOL 213 (H) 07/21/2018   HDL 61 07/21/2018   LDLCALC 123 (H) 07/21/2018   TRIG 170 (H) 07/21/2018   CHOLHDL 3.5 07/21/2018   She has not been working on diet and exercise for prediabetes, she is on bASA, she is not on ACE/ARB and denies hyperglycemia, hypoglycemia , polydipsia and polyuria. Last A1C in the office was:  Lab Results  Component Value Date   HGBA1C 6.0 (H) 07/21/2018   Last GFR: Lab Results   Component Value Date   GFRNONAA 95 07/21/2018   Patient is on Vitamin D supplement.   Lab Results  Component Value Date   VD25OH 34 07/21/2018        Current Medications:  Current Outpatient Medications on File Prior to Visit  Medication Sig Dispense Refill  . aspirin EC 81 MG tablet Take 1 tablet (81 mg total) by mouth daily. (Patient taking differently: Take 81 mg by mouth at bedtime. )    . benzonatate (TESSALON) 200 MG capsule Take 1 cap every 8 hours as needed for cough. 60 capsule 1  . busPIRone (BUSPAR) 10 MG tablet Take 10 mg by mouth as needed.    . butalbital-acetaminophen-caffeine (FIORICET) 50-325-40 MG tablet TAKE 1 TO 2 TABLETS EVERY 6 HOURS AS NEEDED ONSET OF HEADACHE. MAX 6 TABS PER EVENT. MAX 30 A MONTH 90 tablet 1  . CLONAZEPAM PO Take by mouth as needed.    .Marland Kitchen  DimenhyDRINATE (MOTION SICKNESS RELIEF PO) Take 1-2 tablets by mouth at bedtime as needed (motion sickness).    Marland Kitchen lamoTRIgine (LAMICTAL) 150 MG tablet Take 200 mg at bedtime by mouth.     . lurasidone (LATUDA) 20 MG TABS tablet Take 40 mg at bedtime by mouth.     . Melatonin 3 MG CAPS Take 6 mg by mouth at bedtime.     Marland Kitchen PRESCRIPTION MEDICATION Take 10 mLs by mouth every 6 (six) hours. Swich and swallow every 6 hours  Dukes magic mouthwash     . vortioxetine HBr (TRINTELLIX) 20 MG TABS Take 20 mg daily by mouth.    . fluticasone (FLONASE) 50 MCG/ACT nasal spray Place 2 sprays into both nostrils daily as needed for allergies. (Patient not taking: Reported on 65/79/0383) 1 g 5  . folic acid (FOLVITE) 338 MCG tablet Take 800 mcg by mouth at bedtime.      No current facility-administered medications on file prior to visit.    Allergies:  No Known Allergies Medical History:  She has Allergy; Sleep apnea; Migraine; Bipolar disorder (manic depression) ([place]); GERD (gastroesophageal reflux disease); Vitamin D deficiency; Medication management; Labile hypertension; Abnormal glucose; S/P cesarean section; Overweight  (BMI 25.0-29.9); and Mixed hyperlipidemia on their problem list. Health Maintenance:   Immunization History  Administered Date(s) Administered  . Influenza Inj Mdck Quad With Preservative 07/13/2018  . Influenza Split 07/10/2013, 07/04/2014, 06/17/2015  . PPD Test 01/14/2014, 01/16/2015  . Pneumococcal Polysaccharide-23 12/31/2011  . Tdap 12/28/2010   Preventative care: Last colonoscopy: 03/2012 due 2023 Last mammogram: 05/2017, still breastfeeding but plans to go ahead and schedule  Last pap smear/pelvic exam: 2020 last week with GYN  Vaccinations: TD or Tdap: 2018  Influenza: 2020 today Pneumococcal: 2013 Prevnar13:  Shingles/Zostavax: Due, discussed with patient   Names of Other Physician/Practitioners you currently use: 1. [facility] here for primary care 2. Eye Exam 2020, [place] 3  Dentist 2020, goes q84m  Surgical History:  She has a past surgical history that includes Nasal septum surgery (1983); Tonsillectomy (1966); Cesarean section (2009); Colonoscopy (06/2012); Gynecologic cryosurgery; and Cesarean section (N/A, 03/09/2017). Family History:  Herfamily history includes Colon cancer (age of onset: 538 in her paternal uncle; Colon cancer (age of onset: 661 in her paternal uncle; Hearing loss in her daughter; Prostate cancer in her father; Rheum arthritis in her paternal grandmother; Strabismus in her daughter; Stroke in her maternal grandfather and maternal grandmother. Social History:  She reports that she has never smoked. She has never used smokeless tobacco. She reports that she does not drink alcohol or use drugs.  Review of Systems: Review of Systems  Constitutional: Positive for malaise/fatigue. Negative for chills, diaphoresis, fever and weight loss.  HENT: Negative for congestion, ear discharge, ear pain, hearing loss, nosebleeds, sinus pain, sore throat and tinnitus.   Eyes: Negative for blurred vision, double  vision, photophobia, pain, discharge and redness.  Respiratory: Negative for cough, hemoptysis, sputum production, shortness of breath, wheezing and stridor.   Cardiovascular: Negative for chest pain, palpitations, orthopnea, claudication, leg swelling and PND.  Gastrointestinal: Negative for abdominal pain, blood in stool, constipation, diarrhea, heartburn, melena, nausea and vomiting.  Genitourinary: Negative for dysuria, flank pain, frequency, hematuria and urgency.  Musculoskeletal: Negative for back pain, falls, joint pain, myalgias (bil shoulders/upper arms ) and neck pain.  Skin: Negative for itching and rash.  Neurological: Positive for headaches (frequent, bil, starts in neck, band around head). Negative for dizziness,  tingling, tremors, sensory change, speech change, focal weakness, seizures, loss of consciousness and weakness.  Endo/Heme/Allergies: Positive for environmental allergies. Negative for polydipsia. Does not bruise/bleed easily.  Psychiatric/Behavioral: Positive for depression. Negative for hallucinations, memory loss, substance abuse and suicidal ideas (none recent). The patient has insomnia. The patient is not nervous/anxious.     Physical Exam: Estimated body mass index is 23.66 kg/m as calculated from the following:   Height as of this encounter: 5' 4.5" (1.638 m).   Weight as of this encounter: 140 lb (63.5 kg). BP 108/70   Pulse 69   Temp 97.9 F (36.6 C)   Ht 5' 4.5" (1.638 m)   Wt 140 lb (63.5 kg)   LMP 03/12/2012   SpO2 97%   BMI 23.66 kg/m  General Appearance: Well nourished, appears fatigued in no apparent distress.  Eyes: PERRLA, EOMs, conjunctiva no swelling or erythema,  Sinuses: No Frontal/maxillary tenderness  ENT/Mouth: Ext aud canals bilateral cerumen impaction.  Good dentition. No erythema, swelling, or exudate on post pharynx. Tonsils not swollen or erythematous. Hearing normal.  Neck: Supple, thyroid normal. No bruits  Respiratory:  Respiratory effort normal, BS equal bilaterally without rales, rhonchi, wheezing or stridor.  Cardio: RRR without murmurs, rubs or gallops. Brisk peripheral pulses without edema.  Chest: symmetric, with normal excursions and percussion.  Breasts: Defer to gyn  Abdomen: Soft, nontender, no guarding, rebound, hernias, masses, or organomegaly.  Lymphatics: Non tender without lymphadenopathy.  Genitourinary: defer to GYN Musculoskeletal: Full ROM all peripheral extremities, she has bilateral deltoid and biceps tenderness, 5/5 strength, and normal gait.  Skin: Warm, dry without rashes, lesions, ecchymosis. Neuro: Cranial nerves intact, reflexes equal bilaterally. Normal muscle tone, no cerebellar symptoms. Sensation intact.  Psych: Awake and oriented X 3, depressed affect, Insight and Judgment appropriate.   EKG: WNL no ST changes. AORTA SCAN:  Deferred     Izora Ribas, NP 4:11 PM [facility]

## 2019-08-01 ENCOUNTER — Ambulatory Visit (INDEPENDENT_AMBULATORY_CARE_PROVIDER_SITE_OTHER): Admitting: Adult Health

## 2019-08-01 ENCOUNTER — Other Ambulatory Visit: Payer: Self-pay

## 2019-08-01 ENCOUNTER — Encounter: Payer: Self-pay | Admitting: Adult Health

## 2019-08-01 VITALS — BP 108/70 | HR 69 | Temp 97.9°F | Ht 64.5 in | Wt 140.0 lb

## 2019-08-01 DIAGNOSIS — T7840XD Allergy, unspecified, subsequent encounter: Secondary | ICD-10-CM

## 2019-08-01 DIAGNOSIS — K219 Gastro-esophageal reflux disease without esophagitis: Secondary | ICD-10-CM

## 2019-08-01 DIAGNOSIS — Z8261 Family history of arthritis: Secondary | ICD-10-CM | POA: Diagnosis not present

## 2019-08-01 DIAGNOSIS — R0989 Other specified symptoms and signs involving the circulatory and respiratory systems: Secondary | ICD-10-CM

## 2019-08-01 DIAGNOSIS — Z13220 Encounter for screening for lipoid disorders: Secondary | ICD-10-CM | POA: Diagnosis not present

## 2019-08-01 DIAGNOSIS — R5383 Other fatigue: Secondary | ICD-10-CM

## 2019-08-01 DIAGNOSIS — R7309 Other abnormal glucose: Secondary | ICD-10-CM

## 2019-08-01 DIAGNOSIS — R5381 Other malaise: Secondary | ICD-10-CM

## 2019-08-01 DIAGNOSIS — Z136 Encounter for screening for cardiovascular disorders: Secondary | ICD-10-CM | POA: Diagnosis not present

## 2019-08-01 DIAGNOSIS — G4730 Sleep apnea, unspecified: Secondary | ICD-10-CM

## 2019-08-01 DIAGNOSIS — Z79899 Other long term (current) drug therapy: Secondary | ICD-10-CM | POA: Diagnosis not present

## 2019-08-01 DIAGNOSIS — M791 Myalgia, unspecified site: Secondary | ICD-10-CM

## 2019-08-01 DIAGNOSIS — Z131 Encounter for screening for diabetes mellitus: Secondary | ICD-10-CM

## 2019-08-01 DIAGNOSIS — Z0000 Encounter for general adult medical examination without abnormal findings: Secondary | ICD-10-CM | POA: Diagnosis not present

## 2019-08-01 DIAGNOSIS — E559 Vitamin D deficiency, unspecified: Secondary | ICD-10-CM

## 2019-08-01 DIAGNOSIS — E782 Mixed hyperlipidemia: Secondary | ICD-10-CM

## 2019-08-01 DIAGNOSIS — Z1389 Encounter for screening for other disorder: Secondary | ICD-10-CM | POA: Diagnosis not present

## 2019-08-01 DIAGNOSIS — F3175 Bipolar disorder, in partial remission, most recent episode depressed: Secondary | ICD-10-CM

## 2019-08-01 DIAGNOSIS — E663 Overweight: Secondary | ICD-10-CM

## 2019-08-01 DIAGNOSIS — G43809 Other migraine, not intractable, without status migrainosus: Secondary | ICD-10-CM

## 2019-08-01 DIAGNOSIS — Z0001 Encounter for general adult medical examination with abnormal findings: Secondary | ICD-10-CM

## 2019-08-01 MED ORDER — MONTELUKAST SODIUM 10 MG PO TABS: 10.0000 mg | ORAL_TABLET | Freq: Every day | ORAL | 3 refills | Status: DC

## 2019-08-01 MED ORDER — ESOMEPRAZOLE MAGNESIUM 40 MG PO CPDR: 40.0000 mg | DELAYED_RELEASE_CAPSULE | Freq: Every day | ORAL | 1 refills | Status: DC

## 2019-08-01 NOTE — Patient Instructions (Addendum)
Bonnie Hall , Thank you for taking time to come for your Annual Wellness Visit. I appreciate your ongoing commitment to your health goals. Please review the following plan we discussed and let me know if I can assist you in the future.   This is a list of the screening recommended for you and due dates:  Health Maintenance  Topic Date Due  . Flu Shot  04/14/2019  . Mammogram  06/07/2019  . Pap Smear  10/13/2019  . Tetanus Vaccine  12/27/2020  . Colon Cancer Screening  04/11/2022  .  Hepatitis C: One time screening is recommended by Center for Disease Control  (CDC) for  adults born from [birth year] through 1964.   Completed  . HIV Screening  Completed     Ask insurance whether they cover shingrix -    Focus on nuts, avocado, add a daily protein supplement drink, get on daily women's multivitamin      High-Protein and High-Calorie Diet Eating high-protein and high-calorie foods can help you to gain weight, heal after an injury, and recover after an illness or surgery. The specific amount of daily protein and calories you need depends on:  Your body weight.  The reason this diet is recommended for you. What is my plan? Generally, a high-protein, high-calorie diet involves:  Eating 250-500 extra calories each day.  Making sure that you get enough of your daily calories from protein. Ask your health care provider how many of your calories should come from protein. Talk with a health care provider, such as a diet and nutrition specialist (dietitian), about how much protein and how many calories you need each day. Follow the diet as directed by your health care provider. What are tips for following this plan?  Preparing meals  Add whole milk, half-and-half, or heavy cream to cereal, pudding, soup, or hot cocoa.  Add whole milk to instant breakfast drinks.  Add peanut butter to oatmeal or smoothies.  Add powdered milk to baked goods, smoothies, or milkshakes.  Add powdered  milk, cream, or butter to mashed potatoes.  Add cheese to cooked vegetables.  Make whole-milk yogurt parfaits. Top them with granola, fruit, or nuts.  Add cottage cheese to your fruit.  Add avocado, cheese, or both to sandwiches or salads.  Add meat, poultry, or seafood to rice, pasta, casseroles, salads, and soups.  Use mayonnaise when making egg salad, chicken salad, or tuna salad.  Use peanut butter as a dip for vegetables or as a topping for pretzels, celery, or crackers.  Add beans to casseroles, dips, and spreads.  Add pureed beans to sauces and soups.  Replace calorie-free drinks with calorie-containing drinks, such as milk and fruit juice.  Replace water with milk or heavy cream when making foods such as oatmeal, pudding, or cocoa. General instructions  Ask your health care provider if you should take a nutritional supplement.  Try to eat six small meals each day instead of three large meals.  Eat a balanced diet. In each meal, include one food that is high in protein.  Keep nutritious snacks available, such as nuts, trail mixes, dried fruit, and yogurt.  If you have kidney disease or diabetes, talk with your health care provider about how much protein is safe for you. Too much protein may put extra stress on your kidneys.  Drink your calories. Choose high-calorie drinks and have them after your meals. What high-protein foods should I eat?  Vegetables Soybeans. Peas. Grains Quinoa. Bulgur wheat. Meats  and other proteins Beef, pork, and poultry. Fish and seafood. Eggs. Tofu. Textured vegetable protein (TVP). Peanut butter. Nuts and seeds. Dried beans. Protein powders. Dairy Whole milk. Whole-milk yogurt. Powdered milk. Cheese. [employer]. Eggnog. Beverages High-protein supplement drinks. Soy milk. Other foods Protein bars. The items listed above may not be a complete list of high-protein foods and beverages. Contact a dietitian for more options. What  high-calorie foods should I eat? Fruits Dried fruit. Fruit leather. Canned fruit in syrup. Fruit juice. Avocado. Vegetables Vegetables cooked in oil or butter. Fried potatoes. Grains Pasta. Quick breads. Muffins. Pancakes. Ready-to-eat cereal. Meats and other proteins Peanut butter. Nuts and seeds. Dairy Heavy cream. Whipped cream. Cream cheese. Sour cream. Ice cream. Custard. Pudding. Beverages Meal-replacement beverages. Nutrition shakes. Fruit juice. Sugar-sweetened soft drinks. Seasonings and condiments Salad dressing. Mayonnaise. Alfredo sauce. Fruit preserves or jelly. Honey. Syrup. Sweets and desserts Cake. Cookies. Pie. Pastries. Candy bars. Chocolate. Fats and oils Butter or margarine. Oil. Gravy. Other foods Meal-replacement bars. The items listed above may not be a complete list of high-calorie foods and beverages. Contact a dietitian for more options. Summary  A high-protein, high-calorie diet can help you gain weight or heal faster after an injury, illness, or surgery.  To increase your protein and calories, add ingredients such as whole milk, peanut butter, cheese, beans, meat, or seafood to meal items.  To get enough extra calories each day, include high-calorie foods and beverages at each meal.  Adding a high-calorie drink or shake can be an easy way to help you get enough calories each day. Talk with your healthcare provider or dietitian about the best options for you. This information is not intended to replace advice given to you by your health care provider. Make sure you discuss any questions you have with your health care provider. Document Released: 08/30/2005 Document Revised: 08/12/2017 Document Reviewed: 07/12/2017 Elsevier Patient Education  [place].      Muscle Pain, Adult Muscle pain (myalgia) may be mild or severe. In most cases, the pain lasts only a short time and it goes away without treatment. It is normal to feel some muscle pain  after starting a workout program. Muscles that have not been used often will be sore at first. Muscle pain may also be caused by many other things, including:  Overuse or muscle strain, especially if you are not in shape. This is the most common cause of muscle pain.  Injury.  Bruises.  Viruses, such as the flu.  Infectious diseases.  A chronic condition that causes muscle tenderness, fatigue, and headache (fibromyalgia).  A condition, such as lupus, in which the body's disease-fighting system attacks other organs in the body (autoimmune or rheumatologic diseases).  Certain drugs, including ACE inhibitors and statins. To diagnose the cause of your muscle pain, your health care provider will do a physical exam and ask questions about the pain and when it began. If you have not had muscle pain for very long, your health care provider may want to wait before doing much testing. If your muscle pain has lasted a long time, your health care provider may want to run tests right away. In some cases, this may include tests to rule out certain conditions or illnesses. Treatment for muscle pain depends on the cause. Home care is often enough to relieve muscle pain. Your health care provider may also prescribe anti-inflammatory medicine. Follow these instructions at home: Activity  If overuse is causing your muscle pain: ? Slow down  your activities until the pain goes away. ? Do regular, gentle exercises if you are not usually active. ? Warm up before exercising. Stretch before and after exercising. This can help lower the risk of muscle pain.  Do not continue working out if the pain is very bad. Bad pain could mean that you have injured a muscle. Managing pain and discomfort   If directed, apply ice to the sore muscle: ? Put ice in a plastic bag. ? Place a towel between your skin and the bag. ? Leave the ice on for 20 minutes, 2-3 times a day.  You may also alternate between applying ice  and applying heat as told by your health care provider. To apply heat, use the heat source that your health care provider recommends, such as a moist heat pack or a heating pad. ? Place a towel between your skin and the heat source. ? Leave the heat on for 20-30 minutes. ? Remove the heat if your skin turns bright red. This is especially important if you are unable to feel pain, heat, or cold. You may have a greater risk of getting burned. Medicines  Take over-the-counter and prescription medicines only as told by your health care provider.  Do not drive or use heavy machinery while taking prescription pain medicine. Contact a health care provider if:  Your muscle pain gets worse and medicines do not help.  You have muscle pain that lasts longer than 3 days.  You have a rash or fever along with muscle pain.  You have muscle pain after a tick bite.  You have muscle pain while working out, even though you are in good physical condition.  You have redness, soreness, or swelling along with muscle pain.  You have muscle pain after starting a new medicine or changing the dose of a medicine. Get help right away if:  You have trouble breathing.  You have trouble swallowing.  You have muscle pain along with a stiff neck, fever, and vomiting.  You have severe muscle weakness or cannot move part of your body. This information is not intended to replace advice given to you by your health care provider. Make sure you discuss any questions you have with your health care provider. Document Released: 07/22/2006 Document Revised: 08/12/2017 Document Reviewed: 01/20/2016 Elsevier Patient Education  2020 [employer].

## 2019-08-02 ENCOUNTER — Encounter: Payer: Self-pay | Admitting: Adult Health

## 2019-08-03 LAB — CBC WITH DIFFERENTIAL/PLATELET
Absolute Monocytes: 410 cells/uL (ref 200–950)
Basophils Absolute: 63 cells/uL (ref 0–200)
Basophils Relative: 1.1 %
Eosinophils Absolute: 68 cells/uL (ref 15–500)
Eosinophils Relative: 1.2 %
HCT: 38.6 % (ref 35.0–45.0)
Hemoglobin: 12.6 g/dL (ref 11.7–15.5)
Lymphs Abs: 1454 cells/uL (ref 850–3900)
MCH: 30.9 pg (ref 27.0–33.0)
MCHC: 32.6 g/dL (ref 32.0–36.0)
MCV: 94.6 fL (ref 80.0–100.0)
MPV: 10.6 fL (ref 7.5–12.5)
Monocytes Relative: 7.2 %
Neutro Abs: 3705 cells/uL (ref 1500–7800)
Neutrophils Relative %: 65 %
Platelets: 456 10*3/uL — ABNORMAL HIGH (ref 140–400)
RBC: 4.08 10*6/uL (ref 3.80–5.10)
RDW: 12 % (ref 11.0–15.0)
Total Lymphocyte: 25.5 %
WBC: 5.7 10*3/uL (ref 3.8–10.8)

## 2019-08-03 LAB — URINALYSIS, ROUTINE W REFLEX MICROSCOPIC
Bacteria, UA: NONE SEEN /HPF
Bilirubin Urine: NEGATIVE
Glucose, UA: NEGATIVE
Hyaline Cast: NONE SEEN /LPF
Leukocytes,Ua: NEGATIVE
Nitrite: NEGATIVE
Protein, ur: NEGATIVE
Specific Gravity, Urine: 1.028 (ref 1.001–1.03)
Squamous Epithelial / HPF: NONE SEEN /HPF (ref <=5)
WBC, UA: NONE SEEN /HPF (ref 0–5)
pH: 6.5 (ref 5.0–8.0)

## 2019-08-03 LAB — COMPLETE METABOLIC PANEL WITH GFR
AG Ratio: 2.2 (calc) (ref 1.0–2.5)
ALT: 6 U/L (ref 6–29)
AST: 10 U/L (ref 10–35)
Albumin: 4.7 g/dL (ref 3.6–5.1)
Alkaline phosphatase (APISO): 51 U/L (ref 37–153)
BUN: 22 mg/dL (ref 7–25)
CO2: 27 mmol/L (ref 20–32)
Calcium: 9.8 mg/dL (ref 8.6–10.4)
Chloride: 105 mmol/L (ref 98–110)
Creat: 0.66 mg/dL (ref 0.50–1.05)
GFR, Est African American: 114 mL/min/{1.73_m2} (ref >=60)
GFR, Est Non African American: 98 mL/min/{1.73_m2} (ref >=60)
Globulin: 2.1 g/dL (calc) (ref 1.9–3.7)
Glucose, Bld: 88 mg/dL (ref 65–99)
Potassium: 4.3 mmol/L (ref 3.5–5.3)
Sodium: 141 mmol/L (ref 135–146)
Total Bilirubin: 0.3 mg/dL (ref 0.2–1.2)
Total Protein: 6.8 g/dL (ref 6.1–8.1)

## 2019-08-03 LAB — LIPID PANEL
Cholesterol: 196 mg/dL (ref <200)
HDL: 65 mg/dL (ref >=50)
LDL Cholesterol (Calc): 113 mg/dL (calc) — ABNORMAL HIGH
Non-HDL Cholesterol (Calc): 131 mg/dL (calc) — ABNORMAL HIGH (ref <130)
Total CHOL/HDL Ratio: 3 (calc) (ref <5.0)
Triglycerides: 81 mg/dL (ref <150)

## 2019-08-03 LAB — MICROALBUMIN / CREATININE URINE RATIO
Creatinine, Urine: 156 mg/dL (ref 20–275)
Microalb Creat Ratio: 15 mcg/mg creat (ref <30)
Microalb, Ur: 2.3 mg/dL

## 2019-08-03 LAB — VITAMIN D 25 HYDROXY (VIT D DEFICIENCY, FRACTURES): Vit D, 25-Hydroxy: 33 ng/mL (ref 30–100)

## 2019-08-03 LAB — HEMOGLOBIN A1C
Hgb A1c MFr Bld: 5.5 % of total Hgb (ref <5.7)
Mean Plasma Glucose: 111 (calc)
eAG (mmol/L): 6.2 (calc)

## 2019-08-03 LAB — ANA: Anti Nuclear Antibody (ANA): POSITIVE — AB

## 2019-08-03 LAB — SEDIMENTATION RATE: Sed Rate: 6 mm/h (ref 0–30)

## 2019-08-03 LAB — CK: Total CK: 50 U/L (ref 29–143)

## 2019-08-03 LAB — MAGNESIUM: Magnesium: 2.1 mg/dL (ref 1.5–2.5)

## 2019-08-03 LAB — ANTI-NUCLEAR AB-TITER (ANA TITER): ANA Titer 1: 1:80 {titer} — ABNORMAL HIGH

## 2019-08-03 LAB — RHEUMATOID FACTOR: Rheumatoid fact SerPl-aCnc: <14 IU/mL (ref <14)

## 2019-08-03 LAB — C-REACTIVE PROTEIN: CRP: <0.2 mg/L (ref <8.0)

## 2019-08-03 LAB — ANTI-DNA ANTIBODY, DOUBLE-STRANDED: ds DNA Ab: 1 IU/mL

## 2019-09-04 NOTE — Progress Notes (Signed)
Assessment and Plan:  Bonnie Hall was seen today for follow-up.  Diagnoses and all orders for this visit:  Weight loss, unintentional Caloric malnutrition ([place]) Most likely weight loss r/t stress and insufficient caloric intake Protein levels were normal in 07/2019 Reviewed high protein/calorie diet, keep food log, aim for 2000+ calories daily  Discuss weight loss with mental health provider UTD on routine age appropriate cancer screening other than mammogram - reminded to schedule; normal breast exam by GYN recently  Continue close follow up and monitoirng; 4-6 weeks; reach out sooner if any new/concerning symptoms Boost/ensure samples given today   Positive ANA (antinuclear antibody)/ Joint stiffness of both shoulders Autoimmune workup unremarkable other than ANA with 1:80 titer with dense fine speckled pattern- likely benign result but will follow up with anti- smith  ? R/t acute weight loss, though patient perceives some improvement with chiropractic adjustments Monitor; after discussion will trial short taper of prednisone to evaluate response -     Anti-Smith antibody- SLE -     predniSONE (DELTASONE) 20 MG tablet; 2 tablets daily for 3 days, 1 tablet daily for 4 days.  Thrombocytosis (HCC) Acute phase reactant vs other; smear to r/o  -     CBC with Diff -     Pathologist smear review  Further disposition pending results of labs. Discussed med's effects and SE's.   Over 30 minutes of exam, counseling, chart review, and critical decision making was performed.   Future Appointments  Date Time Provider [place]  10/04/2019  2:30 PM Liane Comber, NP [facility] None  01/29/2020  2:30 PM Liane Comber, NP [facility] None  08/04/2020  3:00 PM Liane Comber, NP [facility] None    ------------------------------------------------------------------------------------------------------------------   HPI BP 104/64   Pulse 91   Temp 97.9 F (36.6 C)   Wt 131 lb 12.8 oz  (59.8 kg)   LMP 03/12/2012   SpO2 93%   BMI 22.27 kg/m   58 y.o.female presents for follow up on excessive weight loss and fatigue noted at CPE on 08/02/2019.   She is newly separated since the covid pandemic, 3 children, 22 y/o twins (lots of stress with remote learning), youngest in 57 years old. She is homemaker.  Family is in [place]. Trying to sell home and this is stessful. Attributed weight loss to low caloric intake, admitted she is so busy and stressed she isn't eating as much, trying to get in 2 meals a day but struggling.  Hoping to be able to pack and move to [facility] once she has time to pack when kids are in school.   She had normal labs at CPE; TSH was drawn recently by GYN and reported normal. Also reported myalgia, stiffness/aching in shoulders and upper arms; denied other sx. Due to family history of auto immune disease CBC, CMP/GFR, CK, CRP, ESR, ANA, RA, antiDNA-DS were ordered which were unremarkable overall other than ANA positive with titer 1:80 with dense fine speckled pattern. Reviewed today this is rarely seen with SLE or Sjogren's or systemic sclerosis; shoulder pain does persist primarily muscular, has been working with chiropractor with some improvement.   She reports has been trying to eat more during the day, snacking during day She reports has been trying to get in 2 full meals (sausage biscuit in AM, anything from stirfry to vegetables), snacks on cheese sticks, bananas, bagels with cream cheese Hasn't started boost, but has been meaning.    BMI is Body mass index is 22.27 kg/m. Wt Readings  from Last 3 Encounters:  09/05/19 131 lb 12.8 oz (59.8 kg)  08/01/19 140 lb (63.5 kg)  07/21/18 178 lb (80.7 kg)   She is followed by Dr. Willene Hatchet for Bipolar depression/postpartum depression, followed by [facility] mood clinic. She is following with a counselor and finds this helpful. Currently on trintellix, latuda, lamictal and PRN xanax (PRN  during the day, trying to limit 1-2/week)/klonazepam (daily in the evening), Buspar, taking daily.   She reports she has been seeing a chiropractor for HA and postpartum alignments which have improved HAs somewhat thought she continues to experience HA (bil, starts in neck, with photosensitivity, no nausea or aura) due to stress, 2-3/week. She is taking fioricet 50-325-76m 1-2 tablets for this which typically resolves headache within 20 min. She is trying to limit fioricet frequency.   She has CPAP machine and reports that she does not wear this as it is cumbersome and she is concerned she will not hear the children as she is sole caregiver for three children.  She endorses fatigue (but attributes to early mornings, no nap during the day which she did previously), is sleeping from 8:30 to 5 am, but constantly moving during the day without rest. She states she does wake up feeling reasonably rested some days. Endorses feeling tired but not necessarily malaise.   Denies night sweats, dizziness, back pain/bony pain, palpable enlarged lymph nodes.   She is UTD on recommended cancer screening; She did just have PAP smear, she will schedule mammogram (overdue as was postponing due to breastfeeding), had normal colonoscopy in 2013  She has had persistent thrombocytosis without other blood count abnormalities;  CRP was normal at last visit;  Lab Results  Component Value Date   WBC 5.7 08/01/2019   HGB 12.6 08/01/2019   HCT 38.6 08/01/2019   MCV 94.6 08/01/2019   PLT 456 (H) 08/01/2019      Past Medical History:  Diagnosis Date  . Allergy    Hay fever/exercise induced asthma  . AMA (advanced maternal age) multigravida 351+  . Anxiety   . Asthma   . Blood transfusion 1990's  . Depression    bipolar  . GERD (gastroesophageal reflux disease)   . Hyperlipidemia   . Iron deficiency anemia 1990's   negative work up as to etiology; required iron infusion, blood transfusion.  [facility]   . Migraine   . Newborn product of in vitro fertilization (IVF) pregnancy   . Pregnancy induced hypertension   . Preterm labor   . Sleep apnea   . [place]   . Vaginal Pap smear, abnormal      No Known Allergies  Current Outpatient Medications on File Prior to Visit  Medication Sig  . aspirin EC 81 MG tablet Take 1 tablet (81 mg total) by mouth daily. (Patient taking differently: Take 81 mg by mouth at bedtime. )  . busPIRone (BUSPAR) 10 MG tablet Take 10 mg by mouth as needed.  . butalbital-acetaminophen-caffeine (FIORICET) 50-325-40 MG tablet TAKE 1 TO 2 TABLETS EVERY 6 HOURS AS NEEDED ONSET OF HEADACHE. MAX 6 TABS PER EVENT. MAX 30 A MONTH  . CLONAZEPAM PO Take by mouth as needed.  . DimenhyDRINATE (MOTION SICKNESS RELIEF PO) Take 1-2 tablets by mouth at bedtime as needed (motion sickness).  .Marland Kitchenesomeprazole (NEXIUM) 40 MG capsule Take 1 capsule (40 mg total) by mouth daily.  . fluticasone (FLONASE) 50 MCG/ACT nasal spray Place 2 sprays into both  nostrils daily as needed for allergies.  . folic acid (FOLVITE) 616 MCG tablet Take 800 mcg by mouth at bedtime.   . lamoTRIgine (LAMICTAL) 150 MG tablet Take 200 mg at bedtime by mouth.   . lurasidone (LATUDA) 20 MG TABS tablet Take 40 mg at bedtime by mouth.   . Melatonin 3 MG CAPS Take 6 mg by mouth at bedtime.   . montelukast (SINGULAIR) 10 MG tablet Take 1 tablet (10 mg total) by mouth at bedtime.  . vortioxetine HBr (TRINTELLIX) 20 MG TABS Take 20 mg daily by mouth.  . benzonatate (TESSALON) 200 MG capsule Take 1 cap every 8 hours as needed for cough.  Marland Kitchen PRESCRIPTION MEDICATION Take 10 mLs by mouth every 6 (six) hours. Swich and swallow every 6 hours  [facility] magic mouthwash    No current facility-administered medications on file prior to visit.    ROS: all negative except above.   Physical Exam:  BP 104/64   Pulse 91   Temp 97.9 F (36.6 C)   Wt 131 lb 12.8 oz (59.8 kg)   LMP 03/12/2012    SpO2 93%   BMI 22.27 kg/m   General Appearance: Well nourished, in no apparent distress. Eyes: PERRLA, EOMs, conjunctiva no swelling or erythema ENT/Mouth: Ext aud canals clear, TMs without erythema, bulging. Mask in place; oral exam deferred. Hearing normal.  Neck: Supple, thyroid normal.  Respiratory: Respiratory effort normal, BS equal bilaterally without rales, rhonchi, wheezing or stridor.  Cardio: RRR with no MRGs. Brisk peripheral pulses without edema.  Abdomen: Soft, + BS.  Non tender, no guarding, rebound, hernias, masses. Lymphatics: Non tender without lymphadenopathy.  Musculoskeletal: Reduced extension bilaterally, no crepitus, heat effusion, bilateral deltoid/biceps tenderness; 5/5 strength, normal gait.  Skin: Warm, dry without rashes, lesions, ecchymosis.  Neuro:  Normal muscle tone, no cerebellar symptoms. Sensation intact.  Psych: Awake and oriented X 3, flat affect, Insight and Judgment appropriate.     Izora Ribas, NP 6:00 PM [facility]

## 2019-09-05 ENCOUNTER — Encounter: Payer: Self-pay | Admitting: Adult Health

## 2019-09-05 ENCOUNTER — Ambulatory Visit (INDEPENDENT_AMBULATORY_CARE_PROVIDER_SITE_OTHER): Admitting: Adult Health

## 2019-09-05 ENCOUNTER — Other Ambulatory Visit: Payer: Self-pay

## 2019-09-05 VITALS — BP 104/64 | HR 91 | Temp 97.9°F | Wt 131.8 lb

## 2019-09-05 DIAGNOSIS — M25611 Stiffness of right shoulder, not elsewhere classified: Secondary | ICD-10-CM | POA: Diagnosis not present

## 2019-09-05 DIAGNOSIS — E46 Unspecified protein-calorie malnutrition: Secondary | ICD-10-CM

## 2019-09-05 DIAGNOSIS — D473 Essential (hemorrhagic) thrombocythemia: Secondary | ICD-10-CM

## 2019-09-05 DIAGNOSIS — D75839 Thrombocytosis, unspecified: Secondary | ICD-10-CM

## 2019-09-05 DIAGNOSIS — R768 Other specified abnormal immunological findings in serum: Secondary | ICD-10-CM | POA: Diagnosis not present

## 2019-09-05 DIAGNOSIS — R634 Abnormal weight loss: Secondary | ICD-10-CM | POA: Diagnosis not present

## 2019-09-05 DIAGNOSIS — M25612 Stiffness of left shoulder, not elsewhere classified: Secondary | ICD-10-CM

## 2019-09-05 MED ORDER — PREDNISONE 20 MG PO TABS: ORAL_TABLET | ORAL | 0 refills | Status: DC

## 2019-09-05 NOTE — Patient Instructions (Addendum)
Keep food log - aim to get 2000+ calories daily   Aim for heavy protein, increase nuts, nut butters for healthy protein/fat  Ensure/boost or other protein supplement drinks in addition to current snacks/meals   High-Protein and High-Calorie Diet Eating high-protein and high-calorie foods can help you to gain weight, heal after an injury, and recover after an illness or surgery. The specific amount of daily protein and calories you need depends on:  Your body weight.  The reason this diet is recommended for you. What is my plan? Generally, a high-protein, high-calorie diet involves:  Eating 250-500 extra calories each day.  Making sure that you get enough of your daily calories from protein. Ask your health care provider how many of your calories should come from protein. Talk with a health care provider, such as a diet and nutrition specialist (dietitian), about how much protein and how many calories you need each day. Follow the diet as directed by your health care provider. What are tips for following this plan?  Preparing meals  Add whole milk, half-and-half, or heavy cream to cereal, pudding, soup, or hot cocoa.  Add whole milk to instant breakfast drinks.  Add peanut butter to oatmeal or smoothies.  Add powdered milk to baked goods, smoothies, or milkshakes.  Add powdered milk, cream, or butter to mashed potatoes.  Add cheese to cooked vegetables.  Make whole-milk yogurt parfaits. Top them with granola, fruit, or nuts.  Add cottage cheese to your fruit.  Add avocado, cheese, or both to sandwiches or salads.  Add meat, poultry, or seafood to rice, pasta, casseroles, salads, and soups.  Use mayonnaise when making egg salad, chicken salad, or tuna salad.  Use peanut butter as a dip for vegetables or as a topping for pretzels, celery, or crackers.  Add beans to casseroles, dips, and spreads.  Add pureed beans to sauces and soups.  Replace calorie-free drinks with  calorie-containing drinks, such as milk and fruit juice.  Replace water with milk or heavy cream when making foods such as oatmeal, pudding, or cocoa. General instructions  Ask your health care provider if you should take a nutritional supplement.  Try to eat six small meals each day instead of three large meals.  Eat a balanced diet. In each meal, include one food that is high in protein.  Keep nutritious snacks available, such as nuts, trail mixes, dried fruit, and yogurt.  If you have kidney disease or diabetes, talk with your health care provider about how much protein is safe for you. Too much protein may put extra stress on your kidneys.  Drink your calories. Choose high-calorie drinks and have them after your meals. What high-protein foods should I eat?  Vegetables Soybeans. Peas. Grains Quinoa. Bulgur wheat. Meats and other proteins Beef, pork, and poultry. Fish and seafood. Eggs. Tofu. Textured vegetable protein (TVP). Peanut butter. Nuts and seeds. Dried beans. Protein powders. Dairy Whole milk. Whole-milk yogurt. Powdered milk. Cheese. [employer]. Eggnog. Beverages High-protein supplement drinks. Soy milk. Other foods Protein bars. The items listed above may not be a complete list of high-protein foods and beverages. Contact a dietitian for more options. What high-calorie foods should I eat? Fruits Dried fruit. Fruit leather. Canned fruit in syrup. Fruit juice. Avocado. Vegetables Vegetables cooked in oil or butter. Fried potatoes. Grains Pasta. Quick breads. Muffins. Pancakes. Ready-to-eat cereal. Meats and other proteins Peanut butter. Nuts and seeds. Dairy Heavy cream. Whipped cream. Cream cheese. Sour cream. Ice cream. Custard. Pudding. Beverages Meal-replacement beverages.  Nutrition shakes. Fruit juice. Sugar-sweetened soft drinks. Seasonings and condiments Salad dressing. Mayonnaise. Alfredo sauce. Fruit preserves or jelly. Honey. Syrup. Sweets and  desserts Cake. Cookies. Pie. Pastries. Candy bars. Chocolate. Fats and oils Butter or margarine. Oil. Gravy. Other foods Meal-replacement bars. The items listed above may not be a complete list of high-calorie foods and beverages. Contact a dietitian for more options. Summary  A high-protein, high-calorie diet can help you gain weight or heal faster after an injury, illness, or surgery.  To increase your protein and calories, add ingredients such as whole milk, peanut butter, cheese, beans, meat, or seafood to meal items.  To get enough extra calories each day, include high-calorie foods and beverages at each meal.  Adding a high-calorie drink or shake can be an easy way to help you get enough calories each day. Talk with your healthcare provider or dietitian about the best options for you. This information is not intended to replace advice given to you by your health care provider. Make sure you discuss any questions you have with your health care provider. Document Released: 08/30/2005 Document Revised: 08/12/2017 Document Reviewed: 07/12/2017 Elsevier Patient Education  2020 [place].     Systemic Lupus Erythematosus, Adult Systemic lupus erythematosus (SLE) is a long-term (chronic) disease that can affect many parts of the body. SLE is an autoimmune disease. With this type of disease, the body's defense system (immune system) mistakenly attacks healthy tissues. This can cause damage to the skin, joints, blood vessels, brain, kidneys, lungs, heart, and other internal organs. It causes pain, irritation, and inflammation. What are the causes? The cause of this condition is not known. What increases the risk? The following factors may make you more likely to develop this condition:  Being female.  Being of Asian, Hispanic, or African-American descent.  Having a family history of the condition.  Being exposed to tobacco smoke or smoking cigarettes.  Having an infection with  a virus, such as Epstein-Barr virus.  Having a history of exposure to silica dust, metals, chemicals, mold or mildew, or insecticides.  Using oral contraceptives or hormone replacement therapy. What are the signs or symptoms? This condition can affect almost any organ or system in the body. Symptoms of the condition depend on which organ or system is affected. The most common symptoms include:  Fever.  Fatigue.  Weight loss.  Muscle aches.  Joint pain.  Skin rashes, especially over the nose and cheeks (butterfly rash) and after sun exposure. Symptoms can come and go. A period of time when symptoms get worse or come back is called a flare. A period of time with no symptoms is called a remission. How is this diagnosed? This condition is diagnosed based on:  Your symptoms.  Your medical history.  A physical exam. You may also have tests, including:  Blood tests.  Urine tests.  A chest X-ray. You may be referred to an autoimmune disease specialist (rheumatologist). How is this treated? There is no cure for this condition, but treatment can help to control symptoms, prevent flares (keep symptoms in remission), and prevent damage to the heart, lungs, kidneys, and other organs. Treatment will depend on what symptoms you are having and what organs or systems are affected. Treatment may involve taking a combination of medicines over time. Common medicines used to treat this condition include:  Antimalarial medicines to control symptoms, prevent flares, and protect against organ damage.  Corticosteroids and NSAIDs to reduce inflammation.  Medicines to weaken your immune system (  immunosuppressants).  Biologic response modifiers to reduce inflammation and damage. Follow these instructions at home: Eating and drinking  Eat a heart-healthy diet. This may include: ? Eating high-fiber foods, such as fresh fruits and vegetables, whole grains, and beans. ? Eating heart-healthy fats  (omega-3 fats), such as fish, flaxseed, and flaxseed oil. ? Limiting foods that are high in saturated fat and cholesterol, such as processed and fried foods, fatty meat, and full-fat dairy. ? Limiting how much salt (sodium) you eat.  Include calcium and vitamin D in your diet. Good sources of calcium and vitamin D include: ? Low-fat dairy products such as milk, yogurt, and cheese. ? Certain fish, such as fresh or canned salmon, tuna, and sardines. ? Products that have calcium and vitamin D added to them (fortified products), such as fortified cereals or juice. Medicines  Take over-the-counter and prescription medicines only as told by your health care provider.  Do not take any medicines that contain estrogen without first checking with your health care provider. Estrogen can trigger flares and may increase your risk for blood clots. Lifestyle      Stay active, as directed by your health care provider.  Do not use any products that contain nicotine or tobacco, such as cigarettes and e-cigarettes. If you need help quitting, ask your health care provider.  Protect your skin from the sun by applying sunblock and wearing protective hats and clothing.  Learn as much as you can about your condition and have a good support system in place. Support may come from family, friends, or a lupus support group. General instructions  Work closely with all of your health care providers to manage your condition.  Stay up to date on all vaccines as directed by your health care provider.  Keep all follow-up visits as told by your health care provider. This is important. Contact a health care provider if:  You have a fever.  Your symptoms flare.  You develop new symptoms.  You have bloody, foamy, or coffee-colored urine.  There are changes in your urination. For example, you urinate more often at night.  You think that you may be depressed or have anxiety.  You become pregnant or plan to  become pregnant. Pregnancy in women with this condition is considered high risk. Get help right away if:  You have chest pain.  You have trouble breathing.  You have a seizure.  You suddenly get a very bad headache.  You suddenly develop facial or body weakness.  You cannot speak.  You cannot understand speech. These symptoms may represent a serious problem that is an emergency. Do not wait to see if the symptoms will go away. Get medical help right away. Call your local emergency services (911 in the [place] Do not drive yourself to the hospital. Summary  Systemic lupus erythematosus (SLE) is a long-term disease that can affect many parts of the body.  SLE is an autoimmune disease. That means your body's defense system (immune system) mistakenly attacks healthy tissues.  There is no cure for this condition, but treatment can help to control symptoms, prevent flares, and prevent damage to your organs. Treatment may involve taking a combination of medicines over time. This information is not intended to replace advice given to you by your health care provider. Make sure you discuss any questions you have with your health care provider. Document Released: 08/20/2002 Document Revised: 10/13/2017 Document Reviewed: 10/07/2017 Elsevier Patient Education  2020 [employer].      Platelet  Count Test Why am I having this test? Platelets are specialized cells that help the blood clot. When you get a tissue injury like a cut, platelets gather at the site of the injury to stop the bleeding. You may have a platelet count test:  If you have symptoms that may be related to excess bleeding or delayed blood clotting, such as: ? A rash of pinprick-sized red and purple dots on the skin (petechiae). These are small collections of blood (hemorrhages) in the skin. ? Heavy menstrual bleeding.  To help monitor treatment for: ? Thrombocytopenia. This is a condition in which you have a low platelet  count. ? Bone marrow failure. What is being tested? This test measures how many platelets you have within a specific amount (volume) of blood. What kind of sample is taken?  A blood sample is required for this test. It is usually collected by inserting a needle into a blood vessel or by sticking a finger with a small needle. Tell a health care provider about:  Any allergies you have.  All medicines you are taking, including vitamins, herbs, eye drops, creams, and over-the-counter medicines.  Any blood disorders you have.  Any surgeries you have had.  Any medical conditions you have.  Whether you are pregnant or may be pregnant. How are the results reported? Your test results will be reported as a value that indicates how many platelets are in the blood volume. This will be given as platelets per cubic millimeter (mm3) of blood. Your health care provider will compare your results to normal ranges that were established after testing a large group of people (reference ranges). Reference ranges may vary among labs and hospitals. For this test, common reference ranges are:  Adult or elderly: 150,000-400,000/mm3.  Child: 150,000-400,000/mm3.  Infant: 200,000-475,000/mm3.  Premature infant: 100,000-300,000/mm3.  Newborn: 150,000-300,000/mm3. What do the results mean? A result that is within your reference range is considered normal, meaning that you have a normal amount of platelets in your blood. A result that is higher than your reference range means that you have too many platelets in your blood. This may mean that you have:  Certain types of cancer, such as leukemia or lymphoma.  A condition in which the bone marrow produces excess amounts of all cell types, including platelets (polycythemia vera).  A condition that can occur after surgery to remove the spleen (post-splenectomy syndrome).  Rheumatoid arthritis.  Anemia due to lack of iron (iron-deficiency anemia). A result  that is lower than your reference range means that you have too few platelets in your blood. This may mean that you have:  A condition in which the spleen breaks down platelets faster than normal (hypersplenism).  A hemorrhage somewhere in your body.  Low platelet count due to your body's disease-fighting system attacking your platelets (immune thrombocytopenia).  Cancer. Chemotherapy treatments for cancers such as leukemia can also cause low platelet count.  A rare, serious form of thrombocytopenia that causes blood clots (thrombotic thrombocytopenia).  HELLP syndrome, a disorder of pregnancy that causes high blood pressure and other serious problems.  Certain disorders that are passed from parent to child (inherited) that cause a low platelet count.  A condition in which the proteins that control blood clotting are overactive, causing abnormal clotting processes to occur (disseminated intravascular coagulation, DIC).  A disease that causes long-term inflammation and pain in many parts of the body (systemic lupus erythematosus, SLE).  Certain types of anemia, such as pernicious anemia or hemolytic anemia.  Infection. Talk with your health care provider about what your results mean. Questions to ask your health care provider Ask your health care provider, or the department that is doing the test:  When will my results be ready?  How will I get my results?  What are my treatment options?  What other tests do I need?  What are my next steps? Summary  Platelets are specialized cells that help the blood clot. When you get a tissue injury like a cut, platelets gather at the site of the injury to stop the bleeding.  This test measures how many platelets you have within a specific amount (volume) of blood.  Talk with your health care provider about what your results mean. This information is not intended to replace advice given to you by your health care provider. Make sure you  discuss any questions you have with your health care provider. Document Released: 09/24/2004 Document Revised: 05/23/2017 Document Reviewed: 05/23/2017 Elsevier Patient Education  2020 [employer].

## 2019-09-06 LAB — CBC WITH DIFFERENTIAL/PLATELET
Absolute Monocytes: 432 cells/uL (ref 200–950)
Basophils Absolute: 60 cells/uL (ref 0–200)
Basophils Relative: 1 %
Eosinophils Absolute: 162 cells/uL (ref 15–500)
Eosinophils Relative: 2.7 %
HCT: 38.6 % (ref 35.0–45.0)
Hemoglobin: 13 g/dL (ref 11.7–15.5)
Lymphs Abs: 1398 cells/uL (ref 850–3900)
MCH: 31.9 pg (ref 27.0–33.0)
MCHC: 33.7 g/dL (ref 32.0–36.0)
MCV: 94.6 fL (ref 80.0–100.0)
MPV: 11 fL (ref 7.5–12.5)
Monocytes Relative: 7.2 %
Neutro Abs: 3948 cells/uL (ref 1500–7800)
Neutrophils Relative %: 65.8 %
Platelets: 428 10*3/uL — ABNORMAL HIGH (ref 140–400)
RBC: 4.08 10*6/uL (ref 3.80–5.10)
RDW: 12.4 % (ref 11.0–15.0)
Total Lymphocyte: 23.3 %
WBC: 6 10*3/uL (ref 3.8–10.8)

## 2019-09-06 LAB — ANTI-SMITH ANTIBODY: ENA SM Ab Ser-aCnc: <1 AI

## 2019-09-06 LAB — PATHOLOGIST SMEAR REVIEW

## 2019-10-01 DIAGNOSIS — F439 Reaction to severe stress, unspecified: Secondary | ICD-10-CM | POA: Insufficient documentation

## 2019-10-01 DIAGNOSIS — E46 Unspecified protein-calorie malnutrition: Secondary | ICD-10-CM | POA: Insufficient documentation

## 2019-10-01 DIAGNOSIS — R634 Abnormal weight loss: Secondary | ICD-10-CM | POA: Insufficient documentation

## 2019-10-01 NOTE — Progress Notes (Signed)
Assessment and Plan:  Bonnie Hall was seen today for follow-up.  Diagnoses and all orders for this visit:  Weight loss, unintentional Caloric malnutrition ([place]) Most likely weight loss r/t stress and insufficient caloric intake; weight maintained with addition of boost Protein levels were normal in 07/2019 Reviewed high protein/calorie diet, keep food log, aim for 2000+ calories daily  Discuss weight loss with mental health provider UTD on routine age appropriate cancer screening other than mammogram - has scheduled this week; normal breast exam by GYN recently  Encouraged weight monitoring at home; gradually replace boost with high protein/calorie snacks/foods (e.g greek yogurt with fruit/nuts/granola, etc)   Positive ANA (antinuclear antibody)/ Joint stiffness of both shoulders Autoimmune workup unremarkable other than ANA with 1:80 titer with dense fine speckled pattern- likely benign result- otherwise negative autoimmune workup ? R/t acute weight loss and muscle wasting; patient perceives some improvement with chiropractic adjustments Monitor to monitor;   Thrombocytosis ([facility]) Reactive thrombocytosis per smear; workup as per above otherwise unremarkable; monitor  Further disposition pending results of labs. Discussed med's effects and SE's.   Over 30 minutes of exam, counseling, chart review, and critical decision making was performed.   Future Appointments  Date Time Provider [place]  10/03/2019  9:30 AM Bonnie Comber, NP [facility] None  01/29/2020  2:30 PM Bonnie Comber, NP [facility] None  08/04/2020  3:00 PM Bonnie Comber, NP [facility] None    ------------------------------------------------------------------------------------------------------------------   HPI BP 112/62   Pulse 68   Temp (!) 97.3 F (36.3 C)   Wt 132 lb (59.9 kg)   LMP 03/12/2012   SpO2 97%   BMI 22.31 kg/m   59 y.o.female presents for follow up on excessive weight loss and  fatigue noted at CPE on 08/02/2019.   She is newly separated since the covid pandemic, 3 children, 31 y/o twins (lots of stress with remote learning), youngest in 58 years old. She is homemaker.  Family is in [place]. Trying to sell home and this is stessful. Attributed weight loss to low caloric intake, admitted she was so busy and stressed she isn't eating as much, was struggling to get in 2 meals a day. She has been supplementing with boost per our recommendations. Hoping to be able to pack and move to [facility] where she has more family support once she has time to pack now that kids are back in school.   BMI is Body mass index is 22.31 kg/m. weigh is maintained since last visit; weight is down from 184 lb in 06/2018.  Wt Readings from Last 3 Encounters:  10/03/19 132 lb (59.9 kg)  09/05/19 131 lb 12.8 oz (59.8 kg)  08/01/19 140 lb (63.5 kg)   She had normal labs at CPE; TSH was drawn recently by GYN and reported normal. Also reported myalgia, stiffness/aching in shoulders and upper arms; denied other sx. Due to family history of auto immune disease CBC, CMP/GFR, CK, CRP, ESR, ANA, RA, antiDNA-DS were ordered which were unremarkable overall other than ANA positive with titer 1:80 with dense fine speckled pattern. Anti-Smith on 12/23 was negative. Shoulder pain does persist primarily muscular, has been working with chiropractor with some improvement.   She endorses fatigue (but attributes to early mornings, no nap during the day which she did previously), is sleeping from 8:30 to 5 am, but constantly moving during the day without rest. She states she does wake up feeling reasonably rested some days. Endorses feeling tired but not necessarily malaise. Denies night sweats, dizziness,  back pain/bony pain, palpable enlarged lymph nodes or other atypical sx. She is UTD on recommended cancer screening; She did just have PAP smear, she has scheduled mammogram later this week (overdue as was postponing  due to breastfeeding), had normal colonoscopy in 2013.  She is followed by Dr. Willene Hatchet for Bipolar depression/postpartum depression, followed by [facility] mood clinic. She is following with a counselor and finds this helpful. Currently on trintellix, latuda, lamictal and PRN xanax (PRN during the day, trying to limit 1-2/week)/klonazepam (daily in the evening), Buspar, taking daily.   She has had persistent thrombocytosis without other blood count abnormalities;  CRP was normal 08/01/2019; smear review favored reactive thrombocytosis;  Lab Results  Component Value Date   WBC 6.0 09/05/2019   HGB 13.0 09/05/2019   HCT 38.6 09/05/2019   MCV 94.6 09/05/2019   PLT 428 (H) 09/05/2019    Past Medical History:  Diagnosis Date  . Allergy    Hay fever/exercise induced asthma  . AMA (advanced maternal age) multigravida 8+   . Anxiety   . Asthma   . Blood transfusion 1990's  . Depression    bipolar  . GERD (gastroesophageal reflux disease)   . Hyperlipidemia   . Iron deficiency anemia 1990's   negative work up as to etiology; required iron infusion, blood transfusion.  Saw an hematologist  at [facility].   . Migraine   . Newborn product of in vitro fertilization (IVF) pregnancy   . Pregnancy induced hypertension   . Preterm labor   . Sleep apnea   . Thrombocytosis ([place])   . Vaginal Pap smear, abnormal      No Known Allergies  Current Outpatient Medications on File Prior to Visit  Medication Sig  . aspirin EC 81 MG tablet Take 1 tablet (81 mg total) by mouth daily. (Patient taking differently: Take 81 mg by mouth at bedtime. )  . busPIRone (BUSPAR) 10 MG tablet Take 10 mg by mouth as needed.  . butalbital-acetaminophen-caffeine (FIORICET) 50-325-40 MG tablet TAKE 1 TO 2 TABLETS EVERY 6 HOURS AS NEEDED ONSET OF HEADACHE. MAX 6 TABS PER EVENT. MAX 30 A MONTH  . CLONAZEPAM PO Take by mouth as needed.  . DimenhyDRINATE (MOTION SICKNESS RELIEF PO) Take 1-2 tablets by  mouth at bedtime as needed (motion sickness).  Marland Kitchen esomeprazole (NEXIUM) 40 MG capsule Take 1 capsule (40 mg total) by mouth daily.  . fluticasone (FLONASE) 50 MCG/ACT nasal spray Place 2 sprays into both nostrils daily as needed for allergies.  . folic acid (FOLVITE) 299 MCG tablet Take 800 mcg by mouth at bedtime.   . lamoTRIgine (LAMICTAL) 150 MG tablet Take 200 mg at bedtime by mouth.   . lurasidone (LATUDA) 20 MG TABS tablet Take 40 mg at bedtime by mouth.   . Melatonin 3 MG CAPS Take 6 mg by mouth at bedtime.   . montelukast (SINGULAIR) 10 MG tablet Take 1 tablet (10 mg total) by mouth at bedtime.  . predniSONE (DELTASONE) 20 MG tablet 2 tablets daily for 3 days, 1 tablet daily for 4 days.  Marland Kitchen vortioxetine HBr (TRINTELLIX) 20 MG TABS Take 20 mg daily by mouth.   No current facility-administered medications on file prior to visit.    ROS: all negative except above.   Physical Exam:  BP 112/62   Pulse 68   Temp (!) 97.3 F (36.3 C)   Wt 132 lb (59.9 kg)   LMP 03/12/2012   SpO2 97%   BMI 22.31  kg/m   General Appearance: Well nourished, in no apparent distress. Eyes: PERRLA, EOMs, conjunctiva no swelling or erythema ENT/Mouth: Ext aud canals clear, TMs without erythema, bulging. Mask in place; oral exam deferred. Hearing normal.  Neck: Supple, thyroid normal.  Respiratory: Respiratory effort normal, BS equal bilaterally without rales, rhonchi, wheezing or stridor.  Cardio: RRR with no MRGs. Brisk peripheral pulses without edema.  Abdomen: Soft, + BS.  Non tender, no guarding, rebound, hernias, masses. Lymphatics: Non tender without lymphadenopathy.  Musculoskeletal: Reduced extension bilaterally, no crepitus, heat effusion, bilateral deltoid/biceps tenderness; 5/5 strength, normal gait.  Skin: Warm, dry without rashes, lesions, ecchymosis.  Neuro:  Normal muscle tone, no cerebellar symptoms. Sensation intact.  Psych: Awake and oriented X 3, depressed/flat affect, Insight and  Judgment appropriate.     Izora Ribas, NP 9:20 AM [facility]

## 2019-10-03 ENCOUNTER — Encounter: Payer: Self-pay | Admitting: Adult Health

## 2019-10-03 ENCOUNTER — Ambulatory Visit (INDEPENDENT_AMBULATORY_CARE_PROVIDER_SITE_OTHER): Admitting: Adult Health

## 2019-10-03 ENCOUNTER — Other Ambulatory Visit: Payer: Self-pay

## 2019-10-03 VITALS — BP 112/62 | HR 68 | Temp 97.3°F | Wt 132.0 lb

## 2019-10-03 DIAGNOSIS — E46 Unspecified protein-calorie malnutrition: Secondary | ICD-10-CM

## 2019-10-03 DIAGNOSIS — G44219 Episodic tension-type headache, not intractable: Secondary | ICD-10-CM

## 2019-10-03 DIAGNOSIS — F3175 Bipolar disorder, in partial remission, most recent episode depressed: Secondary | ICD-10-CM | POA: Diagnosis not present

## 2019-10-03 DIAGNOSIS — D473 Essential (hemorrhagic) thrombocythemia: Secondary | ICD-10-CM | POA: Diagnosis not present

## 2019-10-03 DIAGNOSIS — D75839 Thrombocytosis, unspecified: Secondary | ICD-10-CM

## 2019-10-03 DIAGNOSIS — R634 Abnormal weight loss: Secondary | ICD-10-CM | POA: Diagnosis not present

## 2019-10-03 MED ORDER — BUTALBITAL-APAP-CAFFEINE 50-325-40 MG PO TABS: ORAL_TABLET | ORAL | 1 refills | Status: DC

## 2019-10-03 NOTE — Patient Instructions (Signed)

## 2019-10-04 ENCOUNTER — Ambulatory Visit: Admitting: Adult Health

## 2019-10-05 LAB — HM MAMMOGRAPHY

## 2019-10-15 ENCOUNTER — Encounter: Payer: Self-pay | Admitting: Internal Medicine

## 2020-01-03 ENCOUNTER — Ambulatory Visit: Admitting: Adult Health

## 2020-01-04 ENCOUNTER — Ambulatory Visit (INDEPENDENT_AMBULATORY_CARE_PROVIDER_SITE_OTHER): Admitting: Adult Health

## 2020-01-04 ENCOUNTER — Encounter: Payer: Self-pay | Admitting: Adult Health

## 2020-01-04 ENCOUNTER — Other Ambulatory Visit: Payer: Self-pay

## 2020-01-04 ENCOUNTER — Ambulatory Visit
Admission: RE | Admit: 2020-01-04 | Discharge: 2020-01-04 | Disposition: A | Discharge status: Home / Self Care | Source: Ambulatory Visit | Attending: Adult Health | Admitting: Adult Health

## 2020-01-04 VITALS — BP 102/70 | HR 82 | Temp 97.7°F | Wt 129.0 lb

## 2020-01-04 DIAGNOSIS — R310 Gross hematuria: Secondary | ICD-10-CM | POA: Diagnosis not present

## 2020-01-04 DIAGNOSIS — R634 Abnormal weight loss: Secondary | ICD-10-CM | POA: Diagnosis not present

## 2020-01-04 DIAGNOSIS — M25512 Pain in left shoulder: Secondary | ICD-10-CM

## 2020-01-04 DIAGNOSIS — R5383 Other fatigue: Secondary | ICD-10-CM

## 2020-01-04 DIAGNOSIS — R3915 Urgency of urination: Secondary | ICD-10-CM

## 2020-01-04 DIAGNOSIS — R300 Dysuria: Secondary | ICD-10-CM

## 2020-01-04 MED ORDER — SULFAMETHOXAZOLE-TRIMETHOPRIM 800-160 MG PO TABS
1.0000 | ORAL_TABLET | Freq: Two times a day (BID) | ORAL | 0 refills | Status: DC
Start: 2020-01-04 — End: 2020-03-05

## 2020-01-04 NOTE — Progress Notes (Signed)
Assessment and Plan:  Bonnie Hall was seen today for hematuria.  Diagnoses and all orders for this visit:  Gross hematuria, setting of 50 lb unintended weight loss this past year, fatigue Possible UTI, though hematuria prior to frequency, etc, mild sx, concerning in setting of recent 50 lb unintended weight loss and fatigue, will proceed with CT abd/pel w w/o contrast, concerning for possible underlying tumor  otherwise UTD on routine cancer screening, never smoker, does have hx of abnormal PAP follows with GYN, hx of hormonal tx  If CT unremarkable but hematuria persistent will pursue cysto, consider pelvic/transvaginal US Push fluids, recommend ED over the weekend if reduced/absent urine output, abdominal pain or distention -     CBC with Differential/Platelet -     BASIC METABOLIC PANEL WITH GFR -     Urinalysis, Routine w reflex microscopic -     Urine Culture -     CT ABDOMEN PELVIS W WO CONTRAST; Future  Urgency of urination/dysuria Will proceed with presumptive UTI tx after discussion Medications: TMP/SMX. Maintain adequate hydration. Follow up if symptoms not improving -     Urinalysis, Routine w reflex microscopic -     Urine Culture -     sulfamethoxazole-trimethoprim (BACTRIM DS) 800-160 MG tablet; Take 1 tablet by mouth 2 (two) times daily.  Left shoulder pain, unspecified chronicity -     DG Shoulder Left; Future  Further disposition pending results of labs. Discussed med's effects and SE's.   Over 30 minutes of exam, counseling, chart review, and critical decision making was performed.   Future Appointments  Date Time Provider [place]  01/29/2020  2:30 PM Liane Comber, NP [facility] None  08/04/2020  3:00 PM Liane Comber, NP [facility] None    ------------------------------------------------------------------------------------------------------------------   HPI BP 102/70   Pulse 82   Temp 97.7 F (36.5 C)   Wt 129 lb (58.5 kg)   LMP 03/12/2012    SpO2 99%   BMI 21.80 kg/m   59 y.o.female, never smoker, hx of bipolar disorder (managed by psych), unintentional weight loss 50 lb in the last year and fatigue presents for evaluation of hematuria that began yesterday.   She reports yesterday morning she started noting red in the toilet bowl, later in the day saw clots, bright red, started noting sense of incomplete bladder emptying, with mild dysuria. Now having some mild urgency and frequency. Reports urine cloudiness, denies odor. Saw blood this AM as well, some clots as well. Denies back pain or flank pain.   She reports had some trouble getting warm last night, but unsure if this was chills or just the weather. Didn't feel feverish.   She reports remote hx of UTI, has had hematuria with this in the past. Not recently sexually active, no new partners, denies STD history. Denies vaginal discharge. Denies flank, back, abdominal pain. Denies hx of kidney stones.   BMI is Body mass index is 21.8 kg/m., notably she has had unintentional weight loss in the past year, down from 178 lb to 140 lb between 07/2018 and 07/2019, further down to 129 lb today, with fatigue and muscle weakness/aching, going through separation and admitted to severely poor intake without other specific symptoms until today. She is UTD on routine cancer screening.  Wt Readings from Last 3 Encounters:  01/04/20 129 lb (58.5 kg)  10/03/19 132 lb (59.9 kg)  09/05/19 131 lb 12.8 oz (59.8 kg)      Past Medical History:  Diagnosis Date  . Allergy  Hay fever/exercise induced asthma  . AMA (advanced maternal age) multigravida 25+   . Anxiety   . Asthma   . Blood transfusion 1990's  . Depression    bipolar  . GERD (gastroesophageal reflux disease)   . Hyperlipidemia   . Iron deficiency anemia 1990's   negative work up as to etiology; required iron infusion, blood transfusion.  Saw an hematologist  at [facility].   . Migraine   . Newborn product of in vitro  fertilization (IVF) pregnancy   . Pregnancy induced hypertension   . Preterm labor   . Sleep apnea   . Thrombocytosis ([place])   . Vaginal Pap smear, abnormal      No Known Allergies  Current Outpatient Medications on File Prior to Visit  Medication Sig  . aspirin EC 81 MG tablet Take 1 tablet (81 mg total) by mouth daily. (Patient taking differently: Take 81 mg by mouth at bedtime. )  . busPIRone (BUSPAR) 10 MG tablet Take 10 mg by mouth as needed.  . butalbital-acetaminophen-caffeine (FIORICET) 50-325-40 MG tablet TAKE 1 TO 2 TABLETS EVERY 6 HOURS AS NEEDED ONSET OF HEADACHE. MAX 6 TABS PER EVENT. MAX 30 A MONTH  . CLONAZEPAM PO Take by mouth as needed.  . DimenhyDRINATE (MOTION SICKNESS RELIEF PO) Take 1-2 tablets by mouth at bedtime as needed (motion sickness).  Marland Kitchen esomeprazole (NEXIUM) 40 MG capsule Take 1 capsule (40 mg total) by mouth daily.  Marland Kitchen lamoTRIgine (LAMICTAL) 150 MG tablet Take 200 mg at bedtime by mouth.   . lurasidone (LATUDA) 20 MG TABS tablet Take 40 mg at bedtime by mouth.   . Melatonin 3 MG CAPS Take 6 mg by mouth at bedtime.   . montelukast (SINGULAIR) 10 MG tablet Take 1 tablet (10 mg total) by mouth at bedtime.  . vortioxetine HBr (TRINTELLIX) 20 MG TABS Take 20 mg daily by mouth.  . fluticasone (FLONASE) 50 MCG/ACT nasal spray Place 2 sprays into both nostrils daily as needed for allergies. (Patient not taking: Reported on 01/04/2020)  . folic acid (FOLVITE) Q000111Q MCG tablet Take 800 mcg by mouth at bedtime.    No current facility-administered medications on file prior to visit.    ROS: all negative except above.   Physical Exam:  BP 102/70   Pulse 82   Temp 97.7 F (36.5 C)   Wt 129 lb (58.5 kg)   LMP 03/12/2012   SpO2 99%   BMI 21.80 kg/m   General Appearance: Well nourished, well dressed in no apparent distress. Eyes: PERRLA, conjunctiva no swelling or erythema ENT/Mouth: No erythema, swelling, or exudate on post pharynx.  Tonsils not swollen or  erythematous. Hearing normal.  Neck: Supple, thyroid normal.  Respiratory: Respiratory effort normal, BS equal bilaterally without rales, rhonchi, wheezing or stridor.  Cardio: RRR with no MRGs. Brisk peripheral pulses without edema.  Abdomen: Soft, + BS.  Non tender, no guarding, rebound, hernias, masses. Bladder non-palpable.  Lymphatics: Non tender without lymphadenopathy.  Musculoskeletal: Full ROM, 5/5 strength, normal gait. She has L shoulder tenderness throughout musculature, deltoid, also with tenderness over joint line, worse anterior. No crepitus, effusion, heat or palpable bony abnormality.  Skin: Warm, dry without rashes, lesions, ecchymosis.  Neuro: Normal muscle tone, Sensation intact.  Psych: Awake and oriented X 3, flat/depressed affect, Insight and Judgment appropriate.     Izora Ribas, NP 11:45 AM [facility]

## 2020-01-04 NOTE — Patient Instructions (Signed)
Sulfamethoxazole; Trimethoprim, SMX-TMP tablets °What is this medicine? °SULFAMETHOXAZOLE; TRIMETHOPRIM or SMX-TMP (suhl fuh meth OK suh zohl; trye METH oh prim) is a combination of a sulfonamide antibiotic and a second antibiotic, trimethoprim. It is used to treat or prevent certain kinds of bacterial infections. It will not work for colds, flu, or other viral infections. °This medicine may be used for other purposes; ask your health care provider or pharmacist if you have questions. °COMMON BRAND NAME(S): Bacter-Aid DS, Bactrim, Bactrim DS, Septra, Septra DS °What should I tell my health care provider before I take this medicine? °They need to know if you have any of these conditions: °· G6PD deficiency °· HIV or AIDS °· kidney disease °· liver disease °· low platelets °· low red blood cell counts °· poor nutrition °· stomach or intestine problems like colitis °· thyroid disease °· an unusual or allergic reaction to sulfamethoxazole, trimethoprim, sulfa drugs, other drugs, foods, dyes, or preservatives °· pregnant or trying to get pregnant °· breast-feeding °How should I use this medicine? °Take this medicine by mouth with a glass of water. Follow the directions on the prescription label. Take your medicine at regular intervals. Do not take it more often than directed. Take all of your medicine as directed even if you think you are better. Do not skip doses or stop your medicine early. °Talk to your pediatrician regarding the use of this medicine in children. While this drug may be prescribed for children as young as 2 months for selected conditions, precautions do apply. °Overdosage: If you think you have taken too much of this medicine contact a poison control center or emergency room at once. °NOTE: This medicine is only for you. Do not share this medicine with others. °What if I miss a dose? °If you miss a dose, take it as soon as you can. If it is almost time for your next dose, take only that dose. Do not  take double or extra doses. °What may interact with this medicine? °Do not take this medicine with any of the following medications: °· dofetilide °This medicine may also interact with the following medications: °· amantadine °· birth control pills °· certain medicines for blood pressure, heart disease °· certain medicines for depression, like amitriptyline °· certain medicines for diabetes, like glipizide or glyburide °· certain medicines that treat or prevent blood clots like warfarin °· cyclosporine °· digoxin °· diuretics °· indomethacin °· methotrexate °· phenytoin °· procainamide °· pyrimethamine °· zidovudine °This list may not describe all possible interactions. Give your health care provider a list of all the medicines, herbs, non-prescription drugs, or dietary supplements you use. Also tell them if you smoke, drink alcohol, or use illegal drugs. Some items may interact with your medicine. °What should I watch for while using this medicine? °Tell your health care provider if your symptoms do not start to get better or if they get worse. °Do not treat diarrhea with over the counter products. Contact your health care provider if you have diarrhea that lasts more than 2 days or if it is severe and watery. °This drug may cause serious skin reactions. They can happen weeks to months after starting the drug. Contact your health care provider right away if you notice fevers or flu-like symptoms with a rash. The rash may be red or purple and then turn into blisters or peeling of the skin. Or, you might notice a red rash with swelling of the face, lips or lymph nodes in your neck   or under your arms. This drug can make you more sensitive to the sun. Keep out of the sun. If you cannot avoid being in the sun, wear protective clothing and sunscreen. Do not use sun lamps or tanning beds/booths. Be careful brushing or flossing your teeth or using a toothpick because you may get an infection or bleed more easily. If you  have any dental work done, tell your dentist you are receiving this drug. What side effects may I notice from receiving this medicine? Side effects that you should report to your doctor or health care professional as soon as possible:  allergic reactions (skin rash, itching or hives; swelling of the face, lips, or tongue)  bloody or watery diarrhea  heartbeat rhythm changes (trouble breathing; chest pain; dizziness; fast, irregular heartbeat; feeling faint or lightheaded, falls,)  fever  high potassium levels (chest pain; fast, irregular heartbeat; muscle weakness)  kidney injury (trouble passing urine or change in the amount of urine)  low blood sugar (feeling anxious; confusion; dizziness; increased hunger; unusually weak or tired; increased sweating; shakiness; cold, clammy skin; irritable; headache; blurred vision; fast heartbeat; loss of consciousness)  low red blood cell counts (trouble breathing; feeling faint; lightheaded, falls; unusually weak or tired)  rash, fever, and swollen lymph nodes  redness, blistering, peeling, or loosening of the skin, including inside the mouth  unusual bruising or bleeding Side effects that usually do not require medical attention (report to your doctor or health care professional if they continue or are bothersome):  loss of appetite  nausea  vomiting This list may not describe all possible side effects. Call your doctor for medical advice about side effects. You may report side effects to FDA at 1-800-FDA-1088. Where should I keep my medicine? Keep out of the reach of children. Store between 15 and 25 degrees C (59 to 77 degrees F). Protect from light. Keep the container tightly closed. Throw away any unused drug after the expiration date. NOTE: This sheet is a summary. It may not cover all possible information. If you have questions about this medicine, talk to your doctor, pharmacist, or health care provider.  2020 Elsevier/Gold  Standard (2019-05-18 12:53:51)      Hematuria, Adult Hematuria is blood in the urine. Blood may be visible in the urine, or it may be identified with a test. This condition can be caused by infections of the bladder, urethra, kidney, or prostate. Other possible causes include:  Kidney stones.  Cancer of the urinary tract.  Too much calcium in the urine.  Conditions that are passed from parent to child (inherited conditions).  Exercise that requires a lot of energy. Infections can usually be treated with medicine, and a kidney stone usually will pass through your urine. If neither of these is the cause of your hematuria, more tests may be needed to identify the cause of your symptoms. It is very important to tell your health care provider about any blood in your urine, even if it is painless or the blood stops without treatment. Blood in the urine, when it happens and then stops and then happens again, can be a symptom of a very serious condition, including cancer. There is no pain in the initial stages of many urinary cancers. Follow these instructions at home: Medicines  Take over-the-counter and prescription medicines only as told by your health care provider.  If you were prescribed an antibiotic medicine, take it as told by your health care provider. Do not stop taking the antibiotic  even if you start to feel better. Eating and drinking  Drink enough fluid to keep your urine clear or pale yellow. It is recommended that you drink 3-4 quarts (2.8-3.8 L) a day. If you have been diagnosed with an infection, it is recommended that you drink cranberry juice in addition to large amounts of water.  Avoid caffeine, tea, and carbonated beverages. These tend to irritate the bladder.  Avoid alcohol because it may irritate the prostate (men). General instructions  If you have been diagnosed with a kidney stone, follow your health care provider's instructions about straining your urine to  catch the stone.  Empty your bladder often. Avoid holding urine for long periods of time.  If you are female: ? After a bowel movement, wipe from front to back and use each piece of toilet paper only once. ? Empty your bladder before and after sex.  Pay attention to any changes in your symptoms. Tell your health care provider about any changes or any new symptoms.  It is your responsibility to get your test results. Ask your health care provider, or the department performing the test, when your results will be ready.  Keep all follow-up visits as told by your health care provider. This is important. Contact a health care provider if:  You develop back pain.  You have a fever.  You have nausea or vomiting.  Your symptoms do not improve after 3 days.  Your symptoms get worse. Get help right away if:  You develop severe vomiting and are unable take medicine without vomiting.  You develop severe pain in your back or abdomen even though you are taking medicine.  You pass a large amount of blood in your urine.  You pass blood clots in your urine.  You feel very weak or like you might faint.  You faint. Summary  Hematuria is blood in the urine. It has many possible causes.  It is very important that you tell your health care provider about any blood in your urine, even if it is painless or the blood stops without treatment.  Take over-the-counter and prescription medicines only as told by your health care provider.  Drink enough fluid to keep your urine clear or pale yellow. This information is not intended to replace advice given to you by your health care provider. Make sure you discuss any questions you have with your health care provider. Document Revised: 01/24/2019 Document Reviewed: 10/02/2016 Elsevier Patient Education  2020 [employer].

## 2020-01-06 LAB — BASIC METABOLIC PANEL WITH GFR
BUN: 17 mg/dL (ref 7–25)
CO2: 29 mmol/L (ref 20–32)
Calcium: 9.7 mg/dL (ref 8.6–10.4)
Chloride: 104 mmol/L (ref 98–110)
Creat: 0.77 mg/dL (ref 0.50–1.05)
GFR, Est African American: 99 mL/min/{1.73_m2} (ref >=60)
GFR, Est Non African American: 85 mL/min/{1.73_m2} (ref >=60)
Glucose, Bld: 92 mg/dL (ref 65–99)
Potassium: 4.4 mmol/L (ref 3.5–5.3)
Sodium: 141 mmol/L (ref 135–146)

## 2020-01-06 LAB — CBC WITH DIFFERENTIAL/PLATELET
Absolute Monocytes: 670 cells/uL (ref 200–950)
Basophils Absolute: 52 cells/uL (ref 0–200)
Basophils Relative: 0.5 %
Eosinophils Absolute: 72 cells/uL (ref 15–500)
Eosinophils Relative: 0.7 %
HCT: 39.9 % (ref 35.0–45.0)
Hemoglobin: 13.2 g/dL (ref 11.7–15.5)
Lymphs Abs: 1205 cells/uL (ref 850–3900)
MCH: 31.7 pg (ref 27.0–33.0)
MCHC: 33.1 g/dL (ref 32.0–36.0)
MCV: 95.9 fL (ref 80.0–100.0)
MPV: 10.7 fL (ref 7.5–12.5)
Monocytes Relative: 6.5 %
Neutro Abs: 8302 cells/uL — ABNORMAL HIGH (ref 1500–7800)
Neutrophils Relative %: 80.6 %
Platelets: 377 10*3/uL (ref 140–400)
RBC: 4.16 10*6/uL (ref 3.80–5.10)
RDW: 12.5 % (ref 11.0–15.0)
Total Lymphocyte: 11.7 %
WBC: 10.3 10*3/uL (ref 3.8–10.8)

## 2020-01-06 LAB — URINE CULTURE
MICRO NUMBER:: 10400577
SPECIMEN QUALITY:: ADEQUATE

## 2020-01-06 LAB — URINALYSIS, ROUTINE W REFLEX MICROSCOPIC
Bilirubin Urine: NEGATIVE
Glucose, UA: NEGATIVE
Hyaline Cast: NONE SEEN /LPF
Ketones, ur: NEGATIVE
Nitrite: NEGATIVE
RBC / HPF: >=60 /HPF — AB (ref 0–2)
Specific Gravity, Urine: 1.02 (ref 1.001–1.03)
Squamous Epithelial / HPF: NONE SEEN /HPF (ref <=5)
pH: <=5 (ref 5.0–8.0)

## 2020-01-23 ENCOUNTER — Other Ambulatory Visit

## 2020-01-28 NOTE — Progress Notes (Deleted)
FOLLOW UP  Assessment and Plan:   Labile hypertension Fairly controlled off of medications  Monitor blood pressure at home; call if consistently over 130/80 Continue DASH diet.   Reminder to go to the ER if any CP, SOB, nausea, dizziness, severe HA, changes vision/speech, left arm numbness and tingling and jaw pain. -     Magnesium  Gastroesophageal reflux disease, esophagitis presence not specified Continue with PPI for now due to increased reflux related to recent pregnancy; discussed trial reduction of PPI with addition of H2 blocker; patient would like to postpone for now *** -     Magnesium  Other migraine without status migrainosus, not intractable *** -     Magnesium  Bipolar disorder in partial remission, most recent episode depressed (HCC) ***lamictal levels       -      Continue medications, follow up with Dr. Pauline Good and [facility]  Vitamin D deficiency *** -     VITAMIN D 25 Hydroxy (Vit-D Deficiency, Fractures)  Medication management -     CBC with Differential/Platelet -     CMP/GFR -     Magnesium  -     UA  Cholesterol Currently mild elevations managed by lifestyle modification Continue low cholesterol diet and exercise.  Check lipid panel.  -      Lipid panel  Unintended weight loss  Caloric malnutrition     Continue diet and meds as discussed. Further disposition pending results of labs. Discussed med's effects and SE's.   Over 30 minutes of exam, counseling, chart review, and critical decision making was performed.   Future Appointments  Date Time Provider [place]  01/29/2020  2:30 PM Liane Comber, NP [facility] None  01/30/2020  9:20 AM [facility] 1 [facility] [address] [facility]  08/04/2020  3:00 PM Liane Comber, NP [facility] None    ----------------------------------------------------------------------------------------------------------------------  HPI 59 y.o. female  presents for 6 month follow up on  hypertension, cholesterol, glucose, weight (recent 50 lb weight loss), mood and vitamin D deficiency.   She was evaluated for hematuria on 01/04/2020, notably with recent hx of 50+ lb weight loss, presumed r/t admittedly very poor oral intake, extreme stress going through divorce during pandemic with young children (67 y/o twins and 2 y/o at home). Previously had no specific symptoms, age appropriate cancer screening was UTD other than mammgram (was still breasts feeding) *** CBC/BMP were normal at last visit, CTabd/pelvis w/wo was ordered nonemergently and pending on 01/30/2020 ***. Urine culture did suggest e. Coli UTI and was treated with Bactrim DS ***  She has CPAP machine and reports that she does not wear this as it is cumbersome and she is concerned she will not hear the children as she is sole caregiver for three children.   She is followed by Dr. Santiago Glad Jones/ Terisa Starr for Bipolar depression/postpartum depression, followed by [facility] mood clinic. She is following with a counselor and finds this helpful. Currently on trintellix, latuda, lamictal and PRN xanax (PRN during the day, trying to limit 1-2/week)/klonazepam (daily in the evening), Buspar, taking daily.    She reports she has been seeing a chiropractor for HA and postpartum alignments which have improved HAs somewhat thought she continues to experience HA (bil, starts in neck, with photosensitivity, no nausea or aura) due to stress, 2-3/week. She is taking fioricet 50-325-40mg  1-2 tablets for this which typically resolves headache within 20 min. She is trying to limit fioricet frequency  She  reports reflux is well controlled with nexium.    Has allergies improved with singular, has albuterol but hasn't needed in years.   BMI is There is no height or weight on file to calculate BMI., she {HAS HAS KQ:[id number] been working on diet and exercise. Wt Readings from Last 3 Encounters:  01/04/20 129 lb (58.5 kg)  10/03/19 132 lb  (59.9 kg)  09/05/19 131 lb 12.8 oz (59.8 kg)   Her blood pressure {HAS HAS NOT:18834} been controlled at home, today their BP is    She {DOES_DOES JZ:[id number] workout. She denies chest pain, shortness of breath, dizziness.   She {ACTION; IS/IS GI:087931 on cholesterol medication {Cholesterol meds:21887} and denies myalgias. Her cholesterol {ACTION; IS/IS NOT:21021397} at goal. The cholesterol last visit was:   Lab Results  Component Value Date   CHOL 196 08/01/2019   HDL 65 08/01/2019   LDLCALC 113 (H) 08/01/2019   TRIG 81 08/01/2019   CHOLHDL 3.0 08/01/2019   Last A1C in the office was:  Lab Results  Component Value Date   HGBA1C 5.5 08/01/2019   Patient is on Vitamin D supplement.   Lab Results  Component Value Date   VD25OH 33 08/01/2019        Current Medications:  Current Outpatient Medications on File Prior to Visit  Medication Sig  . aspirin EC 81 MG tablet Take 1 tablet (81 mg total) by mouth daily. (Patient taking differently: Take 81 mg by mouth at bedtime. )  . busPIRone (BUSPAR) 10 MG tablet Take 10 mg by mouth as needed.  . butalbital-acetaminophen-caffeine (FIORICET) 50-325-40 MG tablet TAKE 1 TO 2 TABLETS EVERY 6 HOURS AS NEEDED ONSET OF HEADACHE. MAX 6 TABS PER EVENT. MAX 30 A MONTH  . CLONAZEPAM PO Take by mouth as needed.  . DimenhyDRINATE (MOTION SICKNESS RELIEF PO) Take 1-2 tablets by mouth at bedtime as needed (motion sickness).  Marland Kitchen esomeprazole (NEXIUM) 40 MG capsule Take 1 capsule (40 mg total) by mouth daily.  . fluticasone (FLONASE) 50 MCG/ACT nasal spray Place 2 sprays into both nostrils daily as needed for allergies. (Patient not taking: Reported on 01/04/2020)  . folic acid (FOLVITE) Q000111Q MCG tablet Take 800 mcg by mouth at bedtime.   . lamoTRIgine (LAMICTAL) 150 MG tablet Take 200 mg at bedtime by mouth.   . lurasidone (LATUDA) 20 MG TABS tablet Take 40 mg at bedtime by mouth.   . Melatonin 3 MG CAPS Take 6 mg by mouth at bedtime.   . montelukast  (SINGULAIR) 10 MG tablet Take 1 tablet (10 mg total) by mouth at bedtime.  . sulfamethoxazole-trimethoprim (BACTRIM DS) 800-160 MG tablet Take 1 tablet by mouth 2 (two) times daily.  Marland Kitchen vortioxetine HBr (TRINTELLIX) 20 MG TABS Take 20 mg daily by mouth.   No current facility-administered medications on file prior to visit.     Allergies: No Known Allergies   Medical History:  Past Medical History:  Diagnosis Date  . Allergy    Hay fever/exercise induced asthma  . AMA (advanced maternal age) multigravida 60+   . Anxiety   . Asthma   . Blood transfusion 1990's  . Depression    bipolar  . GERD (gastroesophageal reflux disease)   . Hyperlipidemia   . Iron deficiency anemia 1990's   negative work up as to etiology; required iron infusion, blood transfusion.  Saw an hematologist  at [facility].   . Migraine   . Newborn product of in vitro fertilization (IVF) pregnancy   . Pregnancy  induced hypertension   . Preterm labor   . Sleep apnea   . Thrombocytosis ([place])   . Vaginal Pap smear, abnormal    Family history- Reviewed and unchanged Social history- Reviewed and unchanged   Review of Systems: *** Review of Systems  Constitutional: Positive for malaise/fatigue. Negative for weight loss.  HENT: Negative for hearing loss and tinnitus.   Eyes: Negative for blurred vision and double vision.  Respiratory: Negative for cough, shortness of breath and wheezing.   Cardiovascular: Negative for chest pain, palpitations, orthopnea, claudication and leg swelling.  Gastrointestinal: Negative for abdominal pain, blood in stool, constipation, diarrhea, heartburn, melena, nausea and vomiting.  Genitourinary: Negative.   Musculoskeletal: Negative for joint pain and myalgias.  Skin: Negative for rash.  Neurological: Negative for dizziness, tingling, sensory change, weakness and headaches.  Endo/Heme/Allergies: Negative for polydipsia.  Psychiatric/Behavioral: Negative.   All other systems reviewed  and are negative.     Physical Exam: LMP 03/12/2012  Wt Readings from Last 3 Encounters:  01/04/20 129 lb (58.5 kg)  10/03/19 132 lb (59.9 kg)  09/05/19 131 lb 12.8 oz (59.8 kg)   General Appearance: Well nourished, in no apparent distress. Eyes: PERRLA, EOMs, conjunctiva no swelling or erythema Sinuses: No Frontal/maxillary tenderness ENT/Mouth: Ext aud canals clear, TMs without erythema, bulging. No erythema, swelling, or exudate on post pharynx.  Tonsils not swollen or erythematous. Hearing normal.  Neck: Supple, thyroid normal.  Respiratory: Respiratory effort normal, BS equal bilaterally without rales, rhonchi, wheezing or stridor.  Cardio: RRR with no MRGs. Brisk peripheral pulses without edema.  Abdomen: Soft, + BS.  Non tender, no guarding, rebound, hernias, masses. Lymphatics: Non tender without lymphadenopathy.  Musculoskeletal: Full ROM, 5/5 strength, {PSY - GAIT AND STATION:22860} gait Skin: Warm, dry without rashes, lesions, ecchymosis.  Neuro: Cranial nerves intact. No cerebellar symptoms.  Psych: Awake and oriented X 3, normal affect, Insight and Judgment appropriate.    Izora Ribas, NP 1:14 PM [facility]

## 2020-01-29 ENCOUNTER — Ambulatory Visit: Admitting: Adult Health

## 2020-01-30 ENCOUNTER — Ambulatory Visit
Admission: RE | Admit: 2020-01-30 | Discharge: 2020-01-30 | Disposition: A | Discharge status: Home / Self Care | Source: Ambulatory Visit | Attending: Adult Health | Admitting: Adult Health

## 2020-01-30 ENCOUNTER — Other Ambulatory Visit: Payer: Self-pay

## 2020-01-30 ENCOUNTER — Encounter: Payer: Self-pay | Admitting: Adult Health

## 2020-01-30 DIAGNOSIS — R5383 Other fatigue: Secondary | ICD-10-CM

## 2020-01-30 DIAGNOSIS — R634 Abnormal weight loss: Secondary | ICD-10-CM

## 2020-01-30 DIAGNOSIS — I700 Atherosclerosis of aorta: Secondary | ICD-10-CM | POA: Insufficient documentation

## 2020-01-30 DIAGNOSIS — R310 Gross hematuria: Secondary | ICD-10-CM

## 2020-01-30 MED ORDER — IOPAMIDOL (ISOVUE-300) INJECTION 61%
125.0000 mL | Freq: Once | INTRAVENOUS | Status: AC | PRN
  Administered 2020-01-30: 125 mL via INTRAVENOUS

## 2020-02-15 NOTE — Progress Notes (Deleted)
FOLLOW UP  Assessment and Plan:   Labile hypertension Fairly controlled off of medications  Monitor blood pressure at home; call if consistently over 130/80 Continue DASH diet.   Reminder to go to the ER if any CP, SOB, nausea, dizziness, severe HA, changes vision/speech, left arm numbness and tingling and jaw pain. -     Magnesium  Gastroesophageal reflux disease, esophagitis presence not specified Continue with PPI for now due to increased reflux related to recent pregnancy; discussed trial reduction of PPI with addition of H2 blocker; patient would like to postpone for now *** -     Magnesium  Other migraine without status migrainosus, not intractable *** -     Magnesium  Bipolar disorder in partial remission, most recent episode depressed (HCC) ***lamictal levels       -      Continue medications, follow up with Dr. Pauline Good and [facility] mood disorder [facility]  Vitamin D deficiency *** -     VITAMIN D 25 Hydroxy (Vit-D Deficiency, Fractures)  Medication management -     CBC with Differential/Platelet -     CMP/GFR -     Magnesium  -     UA  Cholesterol Currently mild elevations managed by lifestyle modification Continue low cholesterol diet and exercise.  Check lipid panel.  -      Lipid panel  Unintended weight loss  Caloric malnutrition     Continue diet and meds as discussed. Further disposition pending results of labs. Discussed med's effects and SE's.   Over 30 minutes of exam, counseling, chart review, and critical decision making was performed.   Future Appointments  Date Time Provider [place]  02/18/2020  9:30 AM Liane Comber, NP [facility] None  08/04/2020  3:00 PM Liane Comber, NP [facility] None    ----------------------------------------------------------------------------------------------------------------------  HPI 59 y.o. female  presents for 6 month follow up on hypertension, cholesterol, glucose, weight (recent 50 lb  weight loss), mood and vitamin D deficiency.   She was evaluated for hematuria on 01/04/2020, notably with recent hx of 50+ lb weight loss, presumed r/t admittedly very poor oral intake, extreme stress going through divorce during pandemic with young children (49 y/o twins and 2 y/o at home). Previously had no specific symptoms, age appropriate cancer screening was UTD other than mammgram (was still breasts feeding) *** CBC/BMP were normal at last visit, CTabd/pelvis w/wo was ordered nonemergently and pending on 01/30/2020 ***. Urine culture did suggest e. Coli UTI and was treated with Bactrim DS ***  She has CPAP machine and reports that she does not wear this as it is cumbersome and she is concerned she will not hear the children as she is sole caregiver for three children.   She is followed by Dr. Santiago Glad Jones/ [facility] Starr for Bipolar depression/postpartum depression, followed by [facility] mood clinic. She is following with a counselor and finds this helpful. Currently on trintellix, latuda, lamictal and PRN xanax (PRN during the day, trying to limit 1-2/week)/klonazepam (daily in the evening), Buspar, taking daily.    She reports she has been seeing a chiropractor for HA and postpartum alignments which have improved HAs somewhat thought she continues to experience HA (bil, starts in neck, with photosensitivity, no nausea or aura) due to stress, 2-3/week. She is taking fioricet 50-325-40mg  1-2 tablets for this which typically resolves headache within 20 min. She is trying to limit fioricet frequency  She reports reflux is well controlled with nexium.    Has allergies  improved with singular, has albuterol but hasn't needed in years.   BMI is There is no height or weight on file to calculate BMI., she {HAS HAS UKG:25427} been working on diet and exercise. Wt Readings from Last 3 Encounters:  01/04/20 129 lb (58.5 kg)  10/03/19 132 lb (59.9 kg)  09/05/19 131 lb 12.8 oz (59.8 kg)   Her blood  pressure {HAS HAS NOT:18834} been controlled at home, today their BP is    She {DOES_DOES CWC:37628} workout. She denies chest pain, shortness of breath, dizziness.   She {ACTION; IS/IS BTD:17616073} on cholesterol medication {Cholesterol meds:21887} and denies myalgias. Her cholesterol {ACTION; IS/IS NOT:21021397} at goal. The cholesterol last visit was:   Lab Results  Component Value Date   CHOL 196 08/01/2019   HDL 65 08/01/2019   LDLCALC 113 (H) 08/01/2019   TRIG 81 08/01/2019   CHOLHDL 3.0 08/01/2019   Last A1C in the office was:  Lab Results  Component Value Date   HGBA1C 5.5 08/01/2019   Patient is on Vitamin D supplement.   Lab Results  Component Value Date   VD25OH 33 08/01/2019        Current Medications:  Current Outpatient Medications on File Prior to Visit  Medication Sig  . aspirin EC 81 MG tablet Take 1 tablet (81 mg total) by mouth daily. (Patient taking differently: Take 81 mg by mouth at bedtime. )  . busPIRone (BUSPAR) 10 MG tablet Take 10 mg by mouth as needed.  . butalbital-acetaminophen-caffeine (FIORICET) 50-325-40 MG tablet TAKE 1 TO 2 TABLETS EVERY 6 HOURS AS NEEDED ONSET OF HEADACHE. MAX 6 TABS PER EVENT. MAX 30 A MONTH  . CLONAZEPAM PO Take by mouth as needed.  . DimenhyDRINATE (MOTION SICKNESS RELIEF PO) Take 1-2 tablets by mouth at bedtime as needed (motion sickness).  Marland Kitchen esomeprazole (NEXIUM) 40 MG capsule Take 1 capsule (40 mg total) by mouth daily.  . fluticasone (FLONASE) 50 MCG/ACT nasal spray Place 2 sprays into both nostrils daily as needed for allergies. (Patient not taking: Reported on 01/04/2020)  . folic acid (FOLVITE) 710 MCG tablet Take 800 mcg by mouth at bedtime.   . lamoTRIgine (LAMICTAL) 150 MG tablet Take 200 mg at bedtime by mouth.   . lurasidone (LATUDA) 20 MG TABS tablet Take 40 mg at bedtime by mouth.   . Melatonin 3 MG CAPS Take 6 mg by mouth at bedtime.   . montelukast (SINGULAIR) 10 MG tablet Take 1 tablet (10 mg total) by  mouth at bedtime.  . sulfamethoxazole-trimethoprim (BACTRIM DS) 800-160 MG tablet Take 1 tablet by mouth 2 (two) times daily.  Marland Kitchen vortioxetine HBr (TRINTELLIX) 20 MG TABS Take 20 mg daily by mouth.   No current facility-administered medications on file prior to visit.     Allergies: No Known Allergies   Medical History:  Past Medical History:  Diagnosis Date  . Allergy    Hay fever/exercise induced asthma  . AMA (advanced maternal age) multigravida 65+   . Anxiety   . Asthma   . Blood transfusion 1990's  . Depression    bipolar  . GERD (gastroesophageal reflux disease)   . Hyperlipidemia   . Iron deficiency anemia 1990's   negative work up as to etiology; required iron infusion, blood transfusion.  Saw an hematologist  at [facility].   . Migraine   . Newborn product of in vitro fertilization (IVF) pregnancy   . Pregnancy induced hypertension   . Preterm labor   . Sleep apnea   .  Thrombocytosis ([place])   . Vaginal Pap smear, abnormal    Family history- Reviewed and unchanged Social history- Reviewed and unchanged   Review of Systems: *** Review of Systems  Constitutional: Positive for malaise/fatigue. Negative for weight loss.  HENT: Negative for hearing loss and tinnitus.   Eyes: Negative for blurred vision and double vision.  Respiratory: Negative for cough, shortness of breath and wheezing.   Cardiovascular: Negative for chest pain, palpitations, orthopnea, claudication and leg swelling.  Gastrointestinal: Negative for abdominal pain, blood in stool, constipation, diarrhea, heartburn, melena, nausea and vomiting.  Genitourinary: Negative.   Musculoskeletal: Negative for joint pain and myalgias.  Skin: Negative for rash.  Neurological: Negative for dizziness, tingling, sensory change, weakness and headaches.  Endo/Heme/Allergies: Negative for polydipsia.  Psychiatric/Behavioral: Positive for depression. Negative for hallucinations, memory loss, substance abuse and suicidal  ideas. The patient is not nervous/anxious.   All other systems reviewed and are negative.    Physical Exam: LMP 03/12/2012  Wt Readings from Last 3 Encounters:  01/04/20 129 lb (58.5 kg)  10/03/19 132 lb (59.9 kg)  09/05/19 131 lb 12.8 oz (59.8 kg)   General Appearance: Well nourished ***, in no apparent distress. Eyes: PERRLA, EOMs, conjunctiva no swelling or erythema Sinuses: No Frontal/maxillary tenderness ENT/Mouth: Ext aud canals clear, TMs without erythema, bulging. No erythema, swelling, or exudate on post pharynx.  Tonsils not swollen or erythematous. Hearing normal.  Neck: Supple, thyroid normal.  Respiratory: Respiratory effort normal, BS equal bilaterally without rales, rhonchi, wheezing or stridor.  Cardio: RRR with no MRGs. Brisk peripheral pulses without edema.  Abdomen: Soft, + BS.  Non tender, no guarding, rebound, hernias, masses. Lymphatics: Non tender without lymphadenopathy.  Musculoskeletal: Full ROM, 5/5 strength, {PSY - GAIT AND STATION:22860} gait Skin: Warm, dry without rashes, lesions, ecchymosis.  Neuro: Cranial nerves intact. No cerebellar symptoms.  Psych: Awake and oriented X 3, normal affect, Insight and Judgment appropriate.    Izora Ribas, NP 1:17 PM [facility]

## 2020-02-18 ENCOUNTER — Ambulatory Visit: Admitting: Adult Health

## 2020-03-05 ENCOUNTER — Other Ambulatory Visit: Payer: Self-pay

## 2020-03-05 ENCOUNTER — Encounter: Payer: Self-pay | Admitting: Adult Health

## 2020-03-05 ENCOUNTER — Ambulatory Visit (INDEPENDENT_AMBULATORY_CARE_PROVIDER_SITE_OTHER): Admitting: Adult Health

## 2020-03-05 VITALS — BP 100/64 | HR 83 | Temp 97.7°F | Wt 126.8 lb

## 2020-03-05 DIAGNOSIS — E559 Vitamin D deficiency, unspecified: Secondary | ICD-10-CM

## 2020-03-05 DIAGNOSIS — Z79899 Other long term (current) drug therapy: Secondary | ICD-10-CM

## 2020-03-05 DIAGNOSIS — F3175 Bipolar disorder, in partial remission, most recent episode depressed: Secondary | ICD-10-CM | POA: Diagnosis not present

## 2020-03-05 DIAGNOSIS — G44219 Episodic tension-type headache, not intractable: Secondary | ICD-10-CM

## 2020-03-05 DIAGNOSIS — E46 Unspecified protein-calorie malnutrition: Secondary | ICD-10-CM

## 2020-03-05 MED ORDER — BUTALBITAL-APAP-CAFFEINE 50-325-40 MG PO TABS: ORAL_TABLET | ORAL | 1 refills | Status: DC

## 2020-03-05 NOTE — Progress Notes (Signed)
FOLLOW UP  Assessment and Plan:   Labile hypertension Fairly controlled off of medications  Monitor blood pressure at home; call if consistently over 130/80 Continue DASH diet.   Reminder to go to the ER if any CP, SOB, nausea, dizziness, severe HA, changes vision/speech, left arm numbness and tingling and jaw pain.  Gastroesophageal reflux disease, esophagitis presence not specified Continue with PPI for now due to increased reflux related to recent pregnancy; discussed trial reduction of PPI with addition of H2 blocker; patient would like to postpone for now   Other migraine without status migrainosus, not intractable Continue fioricet PRN, stress management  Bipolar disorder in partial remission, most recent episode depressed ([place])     -      Continue medications, follow up with [facility] -      Strongly encouraged close follow up with counselor, stress management  Vitamin D deficiency Continue supplement  Cholesterol Currently mild elevations managed by lifestyle modification Continue low cholesterol diet and exercise.  Monitor lipid panel   Caloric malnutrition/ unintended weight loss/stress at home Large workup including labs and Ct abd/pelvis was unremarkable; age appropriate cancer screening UTD. Patient admits to poor oral intake, working on stress reduction, eating more consistently Have encouraged close follow up with counselor and psych Will continue to monitor closely -    Patient preference to defer on any labs today; has had extensive workup including labs and CT recently; felt reasonable to defer lipid panel at this time,   Continue diet and meds as discussed. Further disposition pending results of labs. Discussed med's effects and SE's.   Over 30 minutes of exam, counseling, chart review, and critical decision making was performed.   No future  appointments.  ----------------------------------------------------------------------------------------------------------------------  HPI 58 y.o. female  presents for 6 month follow up on hypertension, cholesterol, glucose, weight (recent 50 lb weight loss), mood and vitamin D deficiency.   She was evaluated for hematuria on 01/04/2020, notably with recent hx of 50+ lb weight loss, presumed r/t admittedly very poor oral intake, extreme stress going through divorce during pandemic with young children (39 y/o twins and 2 y/o at home). Previously had no specific symptoms, age appropriate cancer screening is UTD. CT abd/pelvis w/wo was normal. Hematuria resolved after abx tx for UTI.   She has CPAP machine and reports that she does not wear this as it is cumbersome and she is concerned she will not hear the children as she is sole caregiver for three children.   She is followed by Terisa Starr for Bipolar depression/postpartum depression, followed by [facility] mood clinic. She is following with a counselor Doroteo Glassman and finds this helpful. Currently on trintellix, latuda, lamictal and PRN xanax (PRN during the day, trying to limit 1-2/week)/klonazepam (daily in the evening), Buspar, taking daily.   She reports she has been seeing a chiropractor for HA with improvement, though she continues to experience HA (bil, starts in neck, with photosensitivity, no nausea or aura) which she attributes to stress, 2-3/week. She is taking fioricet 50-325-40mg  1-2 tablets for this which typically resolves headache within 20 min. She is trying to limit fioricet frequency.   She reports reflux is well controlled with nexium.    Has allergies improved with singular, has albuterol but hasn't needed in years.   BMI is Body mass index is 21.43 kg/m., she has not been working on diet and exercise. Working on eating more consistently but reports may or may not get 2 meals  in.  Wt Readings from Last 3 Encounters:   03/05/20 126 lb 12.8 oz (57.5 kg)  01/04/20 129 lb (58.5 kg)  10/03/19 132 lb (59.9 kg)   Today their BP is BP: 100/64  She does not workout. She denies chest pain, shortness of breath, dizziness.   She is not on cholesterol medication. Her cholesterol is not at goal. The cholesterol last visit was:   Lab Results  Component Value Date   CHOL 196 08/01/2019   HDL 65 08/01/2019   LDLCALC 113 (H) 08/01/2019   TRIG 81 08/01/2019   CHOLHDL 3.0 08/01/2019   Last A1C in the office was:  Lab Results  Component Value Date   HGBA1C 5.5 08/01/2019   Patient is on Vitamin D supplement.   Lab Results  Component Value Date   VD25OH 33 08/01/2019        Current Medications:  Current Outpatient Medications on File Prior to Visit  Medication Sig  . aspirin EC 81 MG tablet Take 1 tablet (81 mg total) by mouth daily. (Patient taking differently: Take 81 mg by mouth at bedtime. )  . busPIRone (BUSPAR) 10 MG tablet Take 10 mg by mouth as needed.  Marland Kitchen CLONAZEPAM PO Take by mouth as needed.  . DimenhyDRINATE (MOTION SICKNESS RELIEF PO) Take 1-2 tablets by mouth at bedtime as needed (motion sickness).  Marland Kitchen esomeprazole (NEXIUM) 40 MG capsule Take 1 capsule (40 mg total) by mouth daily.  Marland Kitchen lamoTRIgine (LAMICTAL) 150 MG tablet Take 200 mg at bedtime by mouth.   . lurasidone (LATUDA) 20 MG TABS tablet Take 40 mg at bedtime by mouth.   . Melatonin 3 MG CAPS Take 6 mg by mouth at bedtime.   . montelukast (SINGULAIR) 10 MG tablet Take 1 tablet (10 mg total) by mouth at bedtime.  . vortioxetine HBr (TRINTELLIX) 20 MG TABS Take 20 mg daily by mouth.  . fluticasone (FLONASE) 50 MCG/ACT nasal spray Place 2 sprays into both nostrils daily as needed for allergies. (Patient not taking: Reported on 01/04/2020)   No current facility-administered medications on file prior to visit.     Allergies: No Known Allergies   Medical History:  Past Medical History:  Diagnosis Date  . Allergy    Hay fever/exercise  induced asthma  . AMA (advanced maternal age) multigravida 58+   . Anxiety   . Asthma   . Blood transfusion 1990's  . Depression    bipolar  . GERD (gastroesophageal reflux disease)   . Hyperlipidemia   . Iron deficiency anemia 1990's   negative work up as to etiology; required iron infusion, blood transfusion.  Saw an hematologist  at [facility].   . Migraine   . Newborn product of in vitro fertilization (IVF) pregnancy   . Pregnancy induced hypertension   . Preterm labor   . Sleep apnea   . Thrombocytosis ([place])   . Vaginal Pap smear, abnormal    Family history- Reviewed and unchanged Social history- Reviewed and unchanged   Review of Systems: Review of Systems  Constitutional: Positive for malaise/fatigue. Negative for weight loss.  HENT: Negative for hearing loss and tinnitus.   Eyes: Negative for blurred vision and double vision.  Respiratory: Negative for cough, shortness of breath and wheezing.   Cardiovascular: Negative for chest pain, palpitations, orthopnea, claudication and leg swelling.  Gastrointestinal: Negative for abdominal pain, blood in stool, constipation, diarrhea, heartburn, melena, nausea and vomiting.  Genitourinary: Negative.   Musculoskeletal: Negative for joint pain and myalgias.  Skin: Negative for rash.  Neurological: Positive for headaches. Negative for dizziness, tingling, sensory change and weakness.  Endo/Heme/Allergies: Negative for polydipsia.  Psychiatric/Behavioral: Positive for depression. Negative for hallucinations, memory loss, substance abuse and suicidal ideas. The patient is not nervous/anxious.   All other systems reviewed and are negative.   Physical Exam: BP 100/64   Pulse 83   Temp 97.7 F (36.5 C)   Wt 126 lb 12.8 oz (57.5 kg)   LMP 03/12/2012   SpO2 97%   BMI 21.43 kg/m  Wt Readings from Last 3 Encounters:  03/05/20 126 lb 12.8 oz (57.5 kg)  01/04/20 129 lb (58.5 kg)  10/03/19 132 lb (59.9 kg)   General Appearance: Well  nourished well dressed female with good hygiene, in no apparent distress. Eyes: PERRLA, EOMs, conjunctiva no swelling or erythema Sinuses: No Frontal/maxillary tenderness ENT/Mouth: Ext aud canals clear, TMs without erythema, bulging. No erythema, swelling, or exudate on post pharynx.  Tonsils not swollen or erythematous. Hearing normal.  Neck: Supple, thyroid normal.  Respiratory: Respiratory effort normal, BS equal bilaterally without rales, rhonchi, wheezing or stridor.  Cardio: RRR with no MRGs. Brisk peripheral pulses without edema.  Abdomen: Soft, + BS.  Non tender, no guarding, rebound, hernias, masses. Lymphatics: Non tender without lymphadenopathy.  Musculoskeletal: Full ROM, 5/5 strength, Normal gait Skin: Warm, dry without rashes, lesions, ecchymosis.  Neuro: Cranial nerves intact. No cerebellar symptoms.  Psych: Awake and oriented X 3, depressed affect, Insight and Judgment appropriate.    Bonnie Ribas, NP 5:01 PM [facility]

## 2020-03-06 ENCOUNTER — Other Ambulatory Visit: Payer: Self-pay

## 2020-03-06 DIAGNOSIS — G44219 Episodic tension-type headache, not intractable: Secondary | ICD-10-CM

## 2020-03-06 MED ORDER — BUTALBITAL-APAP-CAFFEINE 50-325-40 MG PO TABS: ORAL_TABLET | ORAL | 1 refills | Status: DC

## 2020-03-26 ENCOUNTER — Other Ambulatory Visit: Payer: Self-pay

## 2020-03-26 MED ORDER — ESOMEPRAZOLE MAGNESIUM 40 MG PO CPDR: 40.0000 mg | DELAYED_RELEASE_CAPSULE | Freq: Every day | ORAL | 1 refills | Status: DC

## 2020-04-23 ENCOUNTER — Other Ambulatory Visit: Payer: Self-pay

## 2020-04-23 ENCOUNTER — Emergency Department (HOSPITAL_BASED_OUTPATIENT_CLINIC_OR_DEPARTMENT_OTHER)
Admission: EM | Admit: 2020-04-23 | Discharge: 2020-04-23 | Disposition: A | Discharge status: Home / Self Care | Attending: Emergency Medicine | Admitting: Emergency Medicine

## 2020-04-23 ENCOUNTER — Encounter (HOSPITAL_BASED_OUTPATIENT_CLINIC_OR_DEPARTMENT_OTHER): Payer: Self-pay

## 2020-04-23 DIAGNOSIS — Z7982 Long term (current) use of aspirin: Secondary | ICD-10-CM | POA: Insufficient documentation

## 2020-04-23 DIAGNOSIS — I10 Essential (primary) hypertension: Secondary | ICD-10-CM | POA: Diagnosis not present

## 2020-04-23 DIAGNOSIS — J45909 Unspecified asthma, uncomplicated: Secondary | ICD-10-CM | POA: Insufficient documentation

## 2020-04-23 DIAGNOSIS — T424X1A Poisoning by benzodiazepines, accidental (unintentional), initial encounter: Secondary | ICD-10-CM

## 2020-04-23 LAB — COMPREHENSIVE METABOLIC PANEL
ALT: 13 U/L (ref 0–44)
AST: 13 U/L — ABNORMAL LOW (ref 15–41)
Albumin: 4 g/dL (ref 3.5–5.0)
Alkaline Phosphatase: 44 U/L (ref 38–126)
Anion gap: 7 (ref 5–15)
BUN: 25 mg/dL — ABNORMAL HIGH (ref 6–20)
CO2: 29 mmol/L (ref 22–32)
Calcium: 9.1 mg/dL (ref 8.9–10.3)
Chloride: 103 mmol/L (ref 98–111)
Creatinine, Ser: 0.68 mg/dL (ref 0.44–1.00)
GFR calc Af Amer: >60 mL/min (ref >60)
GFR calc non Af Amer: >60 mL/min (ref >60)
Glucose, Bld: 121 mg/dL — ABNORMAL HIGH (ref 70–99)
Potassium: 3.9 mmol/L (ref 3.5–5.1)
Sodium: 139 mmol/L (ref 135–145)
Total Bilirubin: 0.2 mg/dL — ABNORMAL LOW (ref 0.3–1.2)
Total Protein: 6.5 g/dL (ref 6.5–8.1)

## 2020-04-23 LAB — CBC WITH DIFFERENTIAL/PLATELET
Abs Immature Granulocytes: 0.01 10*3/uL (ref 0.00–0.07)
Basophils Absolute: 0.1 10*3/uL (ref 0.0–0.1)
Basophils Relative: 1 %
Eosinophils Absolute: 0.2 10*3/uL (ref 0.0–0.5)
Eosinophils Relative: 4 %
HCT: 36.2 % (ref 36.0–46.0)
Hemoglobin: 11.6 g/dL — ABNORMAL LOW (ref 12.0–15.0)
Immature Granulocytes: 0 %
Lymphocytes Relative: 39 %
Lymphs Abs: 2.2 10*3/uL (ref 0.7–4.0)
MCH: 31.6 pg (ref 26.0–34.0)
MCHC: 32 g/dL (ref 30.0–36.0)
MCV: 98.6 fL (ref 80.0–100.0)
Monocytes Absolute: 0.6 10*3/uL (ref 0.1–1.0)
Monocytes Relative: 11 %
Neutro Abs: 2.6 10*3/uL (ref 1.7–7.7)
Neutrophils Relative %: 45 %
Platelets: 406 10*3/uL — ABNORMAL HIGH (ref 150–400)
RBC: 3.67 MIL/uL — ABNORMAL LOW (ref 3.87–5.11)
RDW: 13 % (ref 11.5–15.5)
WBC: 5.6 10*3/uL (ref 4.0–10.5)
nRBC: 0 % (ref 0.0–0.2)

## 2020-04-23 LAB — RAPID URINE DRUG SCREEN, HOSP PERFORMED
Amphetamines: NOT DETECTED
Barbiturates: POSITIVE — AB
Benzodiazepines: POSITIVE — AB
Cocaine: NOT DETECTED
Opiates: NOT DETECTED
Tetrahydrocannabinol: NOT DETECTED

## 2020-04-23 LAB — CBG MONITORING, ED: Glucose-Capillary: 125 mg/dL — ABNORMAL HIGH (ref 70–99)

## 2020-04-23 LAB — ETHANOL: Alcohol, Ethyl (B): <10 mg/dL (ref <10)

## 2020-04-23 LAB — SALICYLATE LEVEL: Salicylate Lvl: <7 mg/dL — ABNORMAL LOW (ref 7.0–30.0)

## 2020-04-23 LAB — ACETAMINOPHEN LEVEL: Acetaminophen (Tylenol), Serum: <10 ug/mL — ABNORMAL LOW (ref 10–30)

## 2020-04-23 MED ORDER — ONDANSETRON 4 MG PO TBDP
ORAL_TABLET | ORAL | Status: AC
  Administered 2020-04-23: 4 mg
  Filled 2020-04-23: qty 1

## 2020-04-23 MED ORDER — SODIUM CHLORIDE 0.9 % IV SOLN: INTRAVENOUS | Status: DC

## 2020-04-23 MED ORDER — SODIUM CHLORIDE 0.9 % IV BOLUS
1000.0000 mL | Freq: Once | INTRAVENOUS | Status: AC
  Administered 2020-04-23: 1000 mL via INTRAVENOUS

## 2020-04-23 NOTE — ED Notes (Signed)
ED Provider at bedside. 

## 2020-04-23 NOTE — ED Provider Notes (Signed)
[facility] EMERGENCY DEPARTMENT Provider Note  CSN: [id number] Arrival date & time: 04/23/20 0008  Chief Complaint(s) No chief complaint on file.  HPI Bonnie Hall is a 59 y.o. female  Here for benzo overdose. Took appprox 4 mg of Xanax 2 hr PTA after argument with her husband. Wanted to sleep. [place] no SI  Patient feels sleepy but denies any other physical complaints. Denies any HI or AVH.   HPI    Past Medical History Past Medical History:  Diagnosis Date  . Allergy    Hay fever/exercise induced asthma  . AMA (advanced maternal age) multigravida 31+   . Anxiety   . Asthma   . Blood transfusion 1990's  . Depression    bipolar  . GERD (gastroesophageal reflux disease)   . Hyperlipidemia   . Iron deficiency anemia 1990's   negative work up as to etiology; required iron infusion, blood transfusion.  Saw an hematologist  at [facility].   . Migraine   . Newborn product of in vitro fertilization (IVF) pregnancy   . Pregnancy induced hypertension   . Preterm labor   . Sleep apnea   . Thrombocytosis ([place])   . Vaginal Pap smear, abnormal    Patient Active Problem List   Diagnosis Date Noted  . Aortic atherosclerosis ([place]) 01/30/2020  . Stress at home 10/01/2019  . Caloric malnutrition ([place]) 10/01/2019  . Mixed hyperlipidemia 07/31/2019  . S/P cesarean section 03/09/2017  . Abnormal glucose 01/16/2015  . Vitamin D deficiency 01/14/2014  . Medication management 01/14/2014  . Labile hypertension 01/14/2014  . Allergy   . Sleep apnea   . Migraine   . Bipolar disorder (manic depression) ([place])   . GERD (gastroesophageal reflux disease)    Home Medication(s) Prior to Admission medications   Medication Sig Start Date End Date Taking? Authorizing Provider  aspirin EC 81 MG tablet Take 1 tablet (81 mg total) by mouth daily. Patient taking differently: Take 81 mg by mouth at bedtime.  01/16/15   Unk Pinto, MD  busPIRone (BUSPAR) 10 MG tablet Take 10  mg by mouth as needed.    [provider]  butalbital-acetaminophen-caffeine (FIORICET) 50-325-40 MG tablet TAKE 1 TO 2 TABLETS EVERY 6 HOURS AS NEEDED ONSET OF HEADACHE. MAX 6 TABS PER EVENT. MAX 30 A MONTH 03/06/20   Liane Comber, NP  CLONAZEPAM PO Take by mouth as needed.    [provider]  DimenhyDRINATE (MOTION SICKNESS RELIEF PO) Take 1-2 tablets by mouth at bedtime as needed (motion sickness).    [provider]  esomeprazole (NEXIUM) 40 MG capsule Take 1 capsule (40 mg total) by mouth daily. 03/26/20   Liane Comber, NP  fluticasone (FLONASE) 50 MCG/ACT nasal spray Place 2 sprays into both nostrils daily as needed for allergies. Patient not taking: Reported on 01/04/2020 07/24/18   Garnet Sierras, NP  lamoTRIgine (LAMICTAL) 150 MG tablet Take 200 mg at bedtime by mouth.     [provider]  lurasidone (LATUDA) 20 MG TABS tablet Take 40 mg at bedtime by mouth.     [provider]  Melatonin 3 MG CAPS Take 6 mg by mouth at bedtime.     [provider]  montelukast (SINGULAIR) 10 MG tablet Take 1 tablet (10 mg total) by mouth at bedtime. 08/01/19   Liane Comber, NP  vortioxetine HBr (TRINTELLIX) 20 MG TABS Take 20 mg daily by mouth.    [provider]  Past Surgical History Past Surgical History:  Procedure Laterality Date  . CESAREAN SECTION  2009  . CESAREAN SECTION N/A 03/09/2017   Procedure: REPEAT CESAREAN SECTION;  Surgeon: Dian Queen, MD;  Location: [facility];  Service: Obstetrics;  Laterality: N/A;  . COLONOSCOPY  06/2012   neg. with [place]  . GYNECOLOGIC CRYOSURGERY    . NASAL SEPTUM SURGERY  1983  . TONSILLECTOMY  1966   Family History Family History  Problem Relation Age of Onset  . Colon cancer Paternal Uncle 52  . Colon cancer Paternal Uncle 70  . Prostate  cancer Father   . Stroke Maternal Grandmother   . Stroke Maternal Grandfather   . Hearing loss Daughter   . Strabismus Daughter   . Rheum arthritis Paternal Grandmother     Social History Social History   Tobacco Use  . Smoking status: Never Smoker  . Smokeless tobacco: Never Used  Vaping Use  . Vaping Use: Never used  Substance Use Topics  . Alcohol use: No  . Drug use: No   Allergies Patient has no known allergies.  Review of Systems Review of Systems All other systems are reviewed and are negative for acute change except as noted in the HPI  Physical Exam Vital Signs  I have reviewed the triage vital signs BP 132/62   Pulse 72   Temp 97.8 F (36.6 C) (Oral)   Resp 17   Ht 5\' 6"  (1.676 m)   Wt 58.5 kg   LMP 03/12/2012   SpO2 100%   BMI 20.82 kg/m   Physical Exam Vitals reviewed.  Constitutional:      General: She is not in acute distress.    Appearance: She is well-developed. She is not diaphoretic.  HENT:     Head: Normocephalic and atraumatic.     Nose: Nose normal.  Eyes:     General: No scleral icterus.       Right eye: No discharge.        Left eye: No discharge.     Conjunctiva/sclera: Conjunctivae normal.     Pupils: Pupils are equal, round, and reactive to light.  Cardiovascular:     Rate and Rhythm: Normal rate and regular rhythm.     Heart sounds: No murmur heard.  No friction rub. No gallop.   Pulmonary:     Effort: Pulmonary effort is normal. No respiratory distress.     Breath sounds: Normal breath sounds. No stridor. No rales.  Abdominal:     General: There is no distension.     Palpations: Abdomen is soft.     Tenderness: There is no abdominal tenderness.  Musculoskeletal:        General: No tenderness.     Cervical back: Normal range of motion and neck supple.  Skin:    General: Skin is warm and dry.     Findings: No erythema or rash.  Neurological:     Mental Status: She is oriented to person, place, and time.     Comments:  sleepy     ED Results and Treatments Labs (all labs ordered are listed, but only abnormal results are displayed) Labs Reviewed  COMPREHENSIVE METABOLIC PANEL - Abnormal; Notable for the following components:      Result Value   Glucose, Bld 121 (*)    BUN 25 (*)    AST 13 (*)    Total Bilirubin 0.2 (*)    All other components within normal limits  SALICYLATE LEVEL -  Abnormal; Notable for the following components:   Salicylate Lvl <0.2 (*)    All other components within normal limits  ACETAMINOPHEN LEVEL - Abnormal; Notable for the following components:   Acetaminophen (Tylenol), Serum <10 (*)    All other components within normal limits  RAPID URINE DRUG SCREEN, HOSP PERFORMED - Abnormal; Notable for the following components:   Benzodiazepines POSITIVE (*)    Barbiturates POSITIVE (*)    All other components within normal limits  CBC WITH DIFFERENTIAL/PLATELET - Abnormal; Notable for the following components:   RBC 3.67 (*)    Hemoglobin 11.6 (*)    Platelets 406 (*)    All other components within normal limits  CBG MONITORING, ED - Abnormal; Notable for the following components:   Glucose-Capillary 125 (*)    All other components within normal limits  ETHANOL                                                                                                                         EKG  EKG Interpretation  Date/Time:  Wednesday April 23 2020 06:03:13 EDT Ventricular Rate:  70 PR Interval:    QRS Duration: 109 QT Interval:  409 QTC Calculation: 442 R Axis:   79 Text Interpretation: Sinus rhythm Low voltage, extremity leads No significant change since last tracing Confirmed by Addison Lank [phone]) on 04/23/2020 6:16:14 AM      Radiology No results found.  Pertinent labs & imaging results that were available during my care of the patient were reviewed by me and considered in my medical decision making (see chart for details).  Medications Ordered in ED Medications    sodium chloride 0.9 % bolus 1,000 mL (0 mLs Intravenous Stopped 04/23/20 0208)    And  sodium chloride 0.9 % bolus 1,000 mL (0 mLs Intravenous Stopped 04/23/20 0318)    And  0.9 %  sodium chloride infusion ( Intravenous New Bag/Given 04/23/20 0321)  ondansetron (ZOFRAN-ODT) 4 MG disintegrating tablet (4 mg  Given 04/23/20 0044)                                                                                                                                    Procedures Procedures  (including critical care time)  Medical Decision Making / ED Course I have reviewed the nursing notes for this encounter and the patient's prior records (if available in EHR or on provided  paperwork).   ELMIRA OLKOWSKI was evaluated in Emergency Department on 04/23/2020 for the symptoms described in the history of present illness. She was evaluated in the context of the global COVID-19 pandemic, which necessitated consideration that the patient might be at risk for infection with the SARS-CoV-2 virus that causes COVID-19. Institutional protocols and algorithms that pertain to the evaluation of patients at risk for COVID-19 are in a state of rapid change based on information released by regulatory bodies including the CDC and federal and state organizations. These policies and algorithms were followed during the patient's care in the ED.  Benzodiazepine overdose. Patient denies suicide attempt and is adamant of this.  Screening labs obtained grossly reassuring Patient provided with IV fluid bolus.  She was monitored for 7 hours and allowed to metabolize. Patient has been up and about numerous times to the bathroom and ambulated without complication.      Final Clinical Impression(s) / ED Diagnoses Final diagnoses:  Benzodiazepine overdose, accidental or unintentional, initial encounter   The patient appears reasonably screened and/or stabilized for discharge and I doubt any other medical condition or other  [facility] requiring further screening, evaluation, or treatment in the ED at this time prior to discharge. Safe for discharge with strict return precautions.  Disposition: Discharge  Condition: Good  I have discussed the results, Dx and Tx plan with the patient/family who expressed understanding and agree(s) with the plan. Discharge instructions discussed at length. The patient/family was given strict return precautions who verbalized understanding of the instructions. No further questions at time of discharge.    ED Discharge Orders    None         Follow Up: Unk Pinto, MD [address] [place] [address] [phone]  Schedule an appointment as soon as possible for a visit        This chart was dictated using voice recognition software.  Despite best efforts to proofread,  errors can occur which can change the documentation meaning.   Fatima Blank, MD 04/23/20 [phone]

## 2020-04-23 NOTE — ED Notes (Signed)
Pt amb to restroom with steady gait, s/b@provided .

## 2020-04-23 NOTE — ED Triage Notes (Signed)
Took 8 0.5 xanax in attempts to go to sleep, argument with husband, denies trying to hurt herself. Husband called EMS, pt A&O, c/o nausea

## 2020-04-23 NOTE — ED Notes (Signed)
Pt states her husband is coming to pick her up. Pt removing IV tape upon entry of this rn to room, IV d/c'd by this rn.

## 2020-04-23 NOTE — ED Notes (Signed)
Patient has been witnessed by this writer ambulating to the restroom at various times of the night.

## 2020-04-23 NOTE — ED Notes (Signed)
Pt states she was told by ems that they would take her back home when she was finished, or she could take an [place]. Explained to pt that she needs to call someone to pick her up, that ems will not give her a ride home and she cannot take an [place] home. Pt angry, cursing at this RN, "all you people do is damn lie!" pt assisted to call family to pick her up. Pt daughter states she can pick up patient around 10:30. Pt given snacks per request.

## 2020-04-23 NOTE — ED Notes (Signed)
Vitals with EMS: 122/92, HR 74 100%RA CBG 125 18RR

## 2020-07-25 ENCOUNTER — Encounter: Admitting: Adult Health

## 2020-08-04 ENCOUNTER — Encounter: Admitting: Adult Health

## 2020-08-05 ENCOUNTER — Other Ambulatory Visit: Payer: Self-pay

## 2020-08-13 ENCOUNTER — Other Ambulatory Visit: Payer: Self-pay | Admitting: Adult Health

## 2020-08-13 ENCOUNTER — Other Ambulatory Visit: Payer: Self-pay

## 2020-08-13 ENCOUNTER — Telehealth: Payer: Self-pay

## 2020-08-13 DIAGNOSIS — G44219 Episodic tension-type headache, not intractable: Secondary | ICD-10-CM

## 2020-08-13 MED ORDER — BUTALBITAL-APAP-CAFFEINE 50-325-40 MG PO TABS: ORAL_TABLET | ORAL | 2 refills | Status: DC

## 2020-08-13 MED ORDER — MONTELUKAST SODIUM 10 MG PO TABS: 10.0000 mg | ORAL_TABLET | Freq: Every day | ORAL | 3 refills | Status: DC

## 2020-08-13 MED ORDER — ESOMEPRAZOLE MAGNESIUM 40 MG PO CPDR: 40.0000 mg | DELAYED_RELEASE_CAPSULE | Freq: Every day | ORAL | 1 refills | Status: DC

## 2020-08-13 NOTE — Telephone Encounter (Signed)
Refill request for Fioricet 40mg 
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
## 2020-09-09 ENCOUNTER — Encounter: Payer: Self-pay | Admitting: Adult Health

## 2020-09-09 ENCOUNTER — Ambulatory Visit (INDEPENDENT_AMBULATORY_CARE_PROVIDER_SITE_OTHER): Admitting: Adult Health

## 2020-09-09 ENCOUNTER — Other Ambulatory Visit: Payer: Self-pay

## 2020-09-09 VITALS — BP 140/80 | HR 66 | Temp 97.6°F | Ht 65.0 in | Wt 125.6 lb

## 2020-09-09 DIAGNOSIS — E559 Vitamin D deficiency, unspecified: Secondary | ICD-10-CM | POA: Diagnosis not present

## 2020-09-09 DIAGNOSIS — Z1389 Encounter for screening for other disorder: Secondary | ICD-10-CM

## 2020-09-09 DIAGNOSIS — Z130 Encounter for screening for diseases of the blood and blood-forming organs and certain disorders involving the immune mechanism: Secondary | ICD-10-CM

## 2020-09-09 DIAGNOSIS — Z0000 Encounter for general adult medical examination without abnormal findings: Secondary | ICD-10-CM

## 2020-09-09 DIAGNOSIS — K219 Gastro-esophageal reflux disease without esophagitis: Secondary | ICD-10-CM

## 2020-09-09 DIAGNOSIS — E782 Mixed hyperlipidemia: Secondary | ICD-10-CM

## 2020-09-09 DIAGNOSIS — F439 Reaction to severe stress, unspecified: Secondary | ICD-10-CM

## 2020-09-09 DIAGNOSIS — R0989 Other specified symptoms and signs involving the circulatory and respiratory systems: Secondary | ICD-10-CM | POA: Diagnosis not present

## 2020-09-09 DIAGNOSIS — F3175 Bipolar disorder, in partial remission, most recent episode depressed: Secondary | ICD-10-CM

## 2020-09-09 DIAGNOSIS — E46 Unspecified protein-calorie malnutrition: Secondary | ICD-10-CM

## 2020-09-09 DIAGNOSIS — Z1329 Encounter for screening for other suspected endocrine disorder: Secondary | ICD-10-CM

## 2020-09-09 DIAGNOSIS — G4730 Sleep apnea, unspecified: Secondary | ICD-10-CM

## 2020-09-09 DIAGNOSIS — R7309 Other abnormal glucose: Secondary | ICD-10-CM

## 2020-09-09 DIAGNOSIS — Z79899 Other long term (current) drug therapy: Secondary | ICD-10-CM | POA: Diagnosis not present

## 2020-09-09 DIAGNOSIS — D649 Anemia, unspecified: Secondary | ICD-10-CM

## 2020-09-09 DIAGNOSIS — Z13220 Encounter for screening for lipoid disorders: Secondary | ICD-10-CM

## 2020-09-09 DIAGNOSIS — T7840XD Allergy, unspecified, subsequent encounter: Secondary | ICD-10-CM

## 2020-09-09 DIAGNOSIS — G43809 Other migraine, not intractable, without status migrainosus: Secondary | ICD-10-CM

## 2020-09-09 DIAGNOSIS — Z0001 Encounter for general adult medical examination with abnormal findings: Secondary | ICD-10-CM

## 2020-09-09 DIAGNOSIS — Z136 Encounter for screening for cardiovascular disorders: Secondary | ICD-10-CM | POA: Diagnosis not present

## 2020-09-09 DIAGNOSIS — I700 Atherosclerosis of aorta: Secondary | ICD-10-CM

## 2020-09-09 NOTE — Patient Instructions (Addendum)
[name] , Thank you for taking time to come for your Annual Wellness Visit. I appreciate your ongoing commitment to your health goals. Please review the following plan we discussed and let me know if I can assist you in the future.   These are the goals we discussed: Goals    . Blood Pressure < 130/80       This is a list of the screening recommended for you and due dates:  Health Maintenance  Topic Date Due  . COVID-19 Vaccine (3 - Inadvertent risk 4-dose series) 01/19/2020  . Flu Shot  04/13/2020  . Tetanus Vaccine  12/27/2020  . Mammogram  10/04/2021  . Colon Cancer Screening  04/11/2022  . Pap Smear  07/24/2022  .  Hepatitis C: One time screening is recommended by Center for Disease Control  (CDC) for  adults born from [birth year] through 1964.   Completed  . HIV Screening  Completed     Know what a healthy weight is for you (roughly BMI <25) and aim to maintain this  Aim for 7+ servings of fruits and vegetables daily  65-80+ fluid ounces of water or unsweet tea for healthy kidneys  Limit to max 1 drink of alcohol per day; avoid smoking/tobacco  Limit animal fats in diet for cholesterol and heart health - choose grass fed whenever available  Avoid highly processed foods, and foods high in saturated/trans fats  Aim for low stress - take time to unwind and care for your mental health  Aim for 150 min of moderate intensity exercise weekly for heart health, and weights twice weekly for bone health  Aim for 7-9 hours of sleep daily    Shingrix - new shingles vaccine - ask insurance if they cover - if they do, can get at [facility]        Tension Headache, Adult A tension headache is pain, pressure, or aching in your head. Tension headaches can last from 30 minutes to several days. Follow these instructions at home: Managing pain  Take over-the-counter and prescription medicines only as told by your doctor.  When you have a headache, lie down in a dark,  quiet room.  If told, put ice on your head and neck: ? Put ice in a plastic bag. ? Place a towel between your skin and the bag. ? Leave the ice on for 20 minutes, 2-3 times a day.  If told, put heat on the back of your neck. Do this as often as your doctor tells you to. Use the kind of heat that your doctor recommends, such as a moist heat pack or a heating pad. ? Place a towel between your skin and the heat. ? Leave the heat on for 20-30 minutes. ? Remove the heat if your skin turns bright red. Eating and drinking  Eat meals on a regular schedule.  Watch how much alcohol you drink: ? If you are a woman and are not pregnant, do not drink more than 1 drink a day. ? If you are a man, do not drink more than 2 drinks a day.  Drink enough fluid to keep your pee (urine) pale yellow.  Do not use a lot of caffeine, or stop using caffeine. Lifestyle  Get enough sleep. Get 7-9 hours of sleep each night. Or get the amount of sleep that your doctor tells you to.  At bedtime, remove all electronic devices from your room. Examples of electronic devices are computers, phones, and tablets.  Find ways to lessen your stress. Some things that can lessen stress are: ? Exercise. ? Deep breathing. ? Yoga. ? Music. ? Positive thoughts.  Sit up straight. Do not tighten (tense) your muscles.  Do not use any products that have nicotine or tobacco in them, such as cigarettes and e-cigarettes. If you need help quitting, ask your doctor. General instructions   Keep all follow-up visits as told by your doctor. This is important.  Avoid things that can bring on headaches. Keep a journal to find out if certain things bring on headaches. For example, write down: ? What you eat and drink. ? How much sleep you get. ? Any change to your diet or medicines. Contact a doctor if:  Your headache does not get better.  Your headache comes back.  You have a headache and sounds, light, or smells bother  you.  You feel sick to your stomach (nauseous) or you throw up (vomit).  Your stomach hurts. Get help right away if:  You suddenly get a very bad headache along with any of these: ? A stiff neck. ? Feeling sick to your stomach. ? Throwing up. ? Feeling weak. ? Trouble seeing. ? Feeling short of breath. ? A rash. ? Feeling unusually sleepy. ? Trouble speaking. ? Pain in your eye or ear. ? Trouble walking or balancing. ? Feeling like you will pass out (faint). ? Passing out. Summary  A tension headache is pain, pressure, or aching in your head.  Tension headaches can last from 30 minutes to several days.  Lifestyle changes and medicines may help relieve pain. This information is not intended to replace advice given to you by your health care provider. Make sure you discuss any questions you have with your health care provider. Document Revised: 06/27/2019 Document Reviewed: 12/10/2016 Elsevier Patient Education  2020 [employer].

## 2020-09-10 ENCOUNTER — Other Ambulatory Visit: Payer: Self-pay | Admitting: Adult Health

## 2020-09-10 DIAGNOSIS — D509 Iron deficiency anemia, unspecified: Secondary | ICD-10-CM | POA: Insufficient documentation

## 2020-09-10 LAB — MICROALBUMIN / CREATININE URINE RATIO
Creatinine, Urine: 215 mg/dL (ref 20–275)
Microalb Creat Ratio: 19 mcg/mg creat (ref <30)
Microalb, Ur: 4.1 mg/dL

## 2020-09-10 LAB — COMPLETE METABOLIC PANEL WITH GFR
AG Ratio: 2.1 (calc) (ref 1.0–2.5)
ALT: 13 U/L (ref 6–29)
AST: 14 U/L (ref 10–35)
Albumin: 4.5 g/dL (ref 3.6–5.1)
Alkaline phosphatase (APISO): 47 U/L (ref 37–153)
BUN: 14 mg/dL (ref 7–25)
CO2: 27 mmol/L (ref 20–32)
Calcium: 9.5 mg/dL (ref 8.6–10.4)
Chloride: 105 mmol/L (ref 98–110)
Creat: 0.73 mg/dL (ref 0.50–1.05)
GFR, Est African American: 105 mL/min/{1.73_m2} (ref >=60)
GFR, Est Non African American: 91 mL/min/{1.73_m2} (ref >=60)
Globulin: 2.1 g/dL (calc) (ref 1.9–3.7)
Glucose, Bld: 105 mg/dL — ABNORMAL HIGH (ref 65–99)
Potassium: 4.2 mmol/L (ref 3.5–5.3)
Sodium: 140 mmol/L (ref 135–146)
Total Bilirubin: 0.3 mg/dL (ref 0.2–1.2)
Total Protein: 6.6 g/dL (ref 6.1–8.1)

## 2020-09-10 LAB — CBC WITH DIFFERENTIAL/PLATELET
Absolute Monocytes: 309 cells/uL (ref 200–950)
Basophils Absolute: 59 cells/uL (ref 0–200)
Basophils Relative: 1.2 %
Eosinophils Absolute: 20 cells/uL (ref 15–500)
Eosinophils Relative: 0.4 %
HCT: 35.7 % (ref 35.0–45.0)
Hemoglobin: 11.6 g/dL — ABNORMAL LOW (ref 11.7–15.5)
Lymphs Abs: 730 cells/uL — ABNORMAL LOW (ref 850–3900)
MCH: 30.4 pg (ref 27.0–33.0)
MCHC: 32.5 g/dL (ref 32.0–36.0)
MCV: 93.5 fL (ref 80.0–100.0)
MPV: 10.7 fL (ref 7.5–12.5)
Monocytes Relative: 6.3 %
Neutro Abs: 3783 cells/uL (ref 1500–7800)
Neutrophils Relative %: 77.2 %
Platelets: 470 10*3/uL — ABNORMAL HIGH (ref 140–400)
RBC: 3.82 10*6/uL (ref 3.80–5.10)
RDW: 12.3 % (ref 11.0–15.0)
Total Lymphocyte: 14.9 %
WBC: 4.9 10*3/uL (ref 3.8–10.8)

## 2020-09-10 LAB — LIPID PANEL
Cholesterol: 207 mg/dL — ABNORMAL HIGH (ref <200)
HDL: 67 mg/dL (ref >=50)
LDL Cholesterol (Calc): 124 mg/dL (calc) — ABNORMAL HIGH
Non-HDL Cholesterol (Calc): 140 mg/dL (calc) — ABNORMAL HIGH (ref <130)
Total CHOL/HDL Ratio: 3.1 (calc) (ref <5.0)
Triglycerides: 70 mg/dL (ref <150)

## 2020-09-10 LAB — IRON,TIBC AND FERRITIN PANEL
%SAT: 9 % (calc) — ABNORMAL LOW (ref 16–45)
Ferritin: 6 ng/mL — ABNORMAL LOW (ref 16–232)
Iron: 31 ug/dL — ABNORMAL LOW (ref 45–160)
TIBC: 354 mcg/dL (calc) (ref 250–450)

## 2020-09-10 LAB — URINALYSIS, ROUTINE W REFLEX MICROSCOPIC
Bacteria, UA: NONE SEEN /HPF
Bilirubin Urine: NEGATIVE
Glucose, UA: NEGATIVE
Hyaline Cast: NONE SEEN /LPF
Ketones, ur: NEGATIVE
Leukocytes,Ua: NEGATIVE
Nitrite: NEGATIVE
RBC / HPF: NONE SEEN /HPF (ref 0–2)
Specific Gravity, Urine: 1.032 (ref 1.001–1.03)
pH: <=5 (ref 5.0–8.0)

## 2020-09-10 LAB — MAGNESIUM: Magnesium: 2.1 mg/dL (ref 1.5–2.5)

## 2020-09-10 LAB — TSH: TSH: 1.01 mIU/L (ref 0.40–4.50)

## 2020-09-10 LAB — VITAMIN D 25 HYDROXY (VIT D DEFICIENCY, FRACTURES): Vit D, 25-Hydroxy: 36 ng/mL (ref 30–100)

## 2020-09-10 LAB — VITAMIN B12: Vitamin B-12: 1215 pg/mL — ABNORMAL HIGH (ref 200–1100)

## 2020-10-10 LAB — HM MAMMOGRAPHY

## 2020-10-14 ENCOUNTER — Encounter: Payer: Self-pay | Admitting: Internal Medicine

## 2020-10-20 ENCOUNTER — Other Ambulatory Visit: Payer: Self-pay

## 2020-10-20 DIAGNOSIS — D509 Iron deficiency anemia, unspecified: Secondary | ICD-10-CM

## 2020-10-20 LAB — POC HEMOCCULT BLD/STL (HOME/3-CARD/SCREEN)
Card #2 Fecal Occult Blod, POC: NEGATIVE
Card #3 Fecal Occult Blood, POC: NEGATIVE
Fecal Occult Blood, POC: NEGATIVE

## 2021-01-08 ENCOUNTER — Telehealth: Payer: Self-pay

## 2021-01-08 NOTE — Telephone Encounter (Signed)
Patient states that she is having issues with writing, has been tripping more and has some issues swallowing. Has been going on for a couple of weeks. Also, would like to be evaluated for Alzheimer's, mother has it and at times she can't get her words out. Please advise.

## 2021-01-12 NOTE — Telephone Encounter (Signed)
Patient states that her psych meds have changed and her speech is better. Has an appointment in June but states that if she needs to be seen prior to that, she will call our office.

## 2021-01-15 ENCOUNTER — Other Ambulatory Visit: Payer: Self-pay | Admitting: Adult Health

## 2021-01-15 ENCOUNTER — Telehealth: Payer: Self-pay

## 2021-01-15 MED ORDER — PROMETHAZINE HCL 25 MG PO TABS: 25.0000 mg | ORAL_TABLET | Freq: Four times a day (QID) | ORAL | 0 refills | Status: DC | PRN

## 2021-01-15 NOTE — Telephone Encounter (Signed)
Patient notified

## 2021-01-15 NOTE — Telephone Encounter (Signed)
Patient states that she has a migraine and is vomiting. Requesting something for the vomiting, unable to come into the office to be seen. Please advise.

## 2021-01-28 ENCOUNTER — Emergency Department (HOSPITAL_BASED_OUTPATIENT_CLINIC_OR_DEPARTMENT_OTHER)

## 2021-01-28 ENCOUNTER — Other Ambulatory Visit: Payer: Self-pay

## 2021-01-28 ENCOUNTER — Emergency Department (HOSPITAL_BASED_OUTPATIENT_CLINIC_OR_DEPARTMENT_OTHER)
Admission: EM | Admit: 2021-01-28 | Discharge: 2021-01-28 | Disposition: A | Discharge status: Home / Self Care | Attending: Emergency Medicine | Admitting: Emergency Medicine

## 2021-01-28 ENCOUNTER — Encounter (HOSPITAL_BASED_OUTPATIENT_CLINIC_OR_DEPARTMENT_OTHER): Payer: Self-pay

## 2021-01-28 ENCOUNTER — Emergency Department (HOSPITAL_COMMUNITY)

## 2021-01-28 DIAGNOSIS — J3489 Other specified disorders of nose and nasal sinuses: Secondary | ICD-10-CM | POA: Diagnosis not present

## 2021-01-28 DIAGNOSIS — G510 Bell's palsy: Secondary | ICD-10-CM

## 2021-01-28 DIAGNOSIS — Z20822 Contact with and (suspected) exposure to covid-19: Secondary | ICD-10-CM | POA: Diagnosis not present

## 2021-01-28 DIAGNOSIS — R29810 Facial weakness: Secondary | ICD-10-CM | POA: Insufficient documentation

## 2021-01-28 DIAGNOSIS — J45909 Unspecified asthma, uncomplicated: Secondary | ICD-10-CM | POA: Insufficient documentation

## 2021-01-28 DIAGNOSIS — Z7982 Long term (current) use of aspirin: Secondary | ICD-10-CM | POA: Insufficient documentation

## 2021-01-28 LAB — COMPREHENSIVE METABOLIC PANEL
ALT: 7 U/L (ref 0–44)
AST: 11 U/L — ABNORMAL LOW (ref 15–41)
Albumin: 4.5 g/dL (ref 3.5–5.0)
Alkaline Phosphatase: 53 U/L (ref 38–126)
Anion gap: 7 (ref 5–15)
BUN: 15 mg/dL (ref 6–20)
CO2: 28 mmol/L (ref 22–32)
Calcium: 9.2 mg/dL (ref 8.9–10.3)
Chloride: 106 mmol/L (ref 98–111)
Creatinine, Ser: 0.84 mg/dL (ref 0.44–1.00)
GFR, Estimated: >60 mL/min (ref >60)
Glucose, Bld: 106 mg/dL — ABNORMAL HIGH (ref 70–99)
Potassium: 4 mmol/L (ref 3.5–5.1)
Sodium: 141 mmol/L (ref 135–145)
Total Bilirubin: 0.2 mg/dL — ABNORMAL LOW (ref 0.3–1.2)
Total Protein: 7 g/dL (ref 6.5–8.1)

## 2021-01-28 LAB — APTT: aPTT: 29 seconds (ref 24–36)

## 2021-01-28 LAB — RAPID URINE DRUG SCREEN, HOSP PERFORMED
Amphetamines: NOT DETECTED
Barbiturates: NOT DETECTED
Benzodiazepines: NOT DETECTED
Cocaine: NOT DETECTED
Opiates: NOT DETECTED
Tetrahydrocannabinol: NOT DETECTED

## 2021-01-28 LAB — DIFFERENTIAL
Abs Immature Granulocytes: 0.02 10*3/uL (ref 0.00–0.07)
Basophils Absolute: 0.1 10*3/uL (ref 0.0–0.1)
Basophils Relative: 1 %
Eosinophils Absolute: 0.3 10*3/uL (ref 0.0–0.5)
Eosinophils Relative: 4 %
Immature Granulocytes: 0 %
Lymphocytes Relative: 17 %
Lymphs Abs: 1.1 10*3/uL (ref 0.7–4.0)
Monocytes Absolute: 0.5 10*3/uL (ref 0.1–1.0)
Monocytes Relative: 8 %
Neutro Abs: 4.7 10*3/uL (ref 1.7–7.7)
Neutrophils Relative %: 70 %

## 2021-01-28 LAB — URINALYSIS, ROUTINE W REFLEX MICROSCOPIC
Bilirubin Urine: NEGATIVE
Glucose, UA: NEGATIVE mg/dL
Ketones, ur: NEGATIVE mg/dL
Leukocytes,Ua: NEGATIVE
Nitrite: NEGATIVE
Specific Gravity, Urine: 1.022 (ref 1.005–1.030)
pH: 7 (ref 5.0–8.0)

## 2021-01-28 LAB — CBC
HCT: 36.5 % (ref 36.0–46.0)
Hemoglobin: 11.2 g/dL — ABNORMAL LOW (ref 12.0–15.0)
MCH: 27.9 pg (ref 26.0–34.0)
MCHC: 30.7 g/dL (ref 30.0–36.0)
MCV: 90.8 fL (ref 80.0–100.0)
Platelets: 490 10*3/uL — ABNORMAL HIGH (ref 150–400)
RBC: 4.02 MIL/uL (ref 3.87–5.11)
RDW: 14.8 % (ref 11.5–15.5)
WBC: 6.7 10*3/uL (ref 4.0–10.5)
nRBC: 0 % (ref 0.0–0.2)

## 2021-01-28 LAB — PROTIME-INR
INR: 0.9 (ref 0.8–1.2)
Prothrombin Time: 12.6 seconds (ref 11.4–15.2)

## 2021-01-28 LAB — RESP PANEL BY RT-PCR (FLU A&B, COVID) ARPGX2
Influenza A by PCR: NEGATIVE
Influenza B by PCR: NEGATIVE
SARS Coronavirus 2 by RT PCR: NEGATIVE

## 2021-01-28 LAB — ETHANOL: Alcohol, Ethyl (B): <10 mg/dL (ref <10)

## 2021-01-28 MED ORDER — VALACYCLOVIR HCL 500 MG PO TABS: 1000.0000 mg | ORAL_TABLET | Freq: Three times a day (TID) | ORAL | Status: DC

## 2021-01-28 MED ORDER — VALACYCLOVIR HCL 500 MG PO TABS
1000.0000 mg | ORAL_TABLET | Freq: Once | ORAL | Status: DC
  Filled 2021-01-28: qty 2

## 2021-01-28 MED ORDER — PREDNISONE 20 MG PO TABS: 60.0000 mg | ORAL_TABLET | Freq: Every day | ORAL | 0 refills | Status: DC

## 2021-01-28 MED ORDER — PREDNISONE 20 MG PO TABS: 60.0000 mg | ORAL_TABLET | Freq: Every day | ORAL | Status: DC

## 2021-01-28 MED ORDER — VALACYCLOVIR HCL 1 G PO TABS: 1000.0000 mg | ORAL_TABLET | Freq: Three times a day (TID) | ORAL | 0 refills | Status: DC

## 2021-01-28 MED ORDER — PREDNISONE 20 MG PO TABS
60.0000 mg | ORAL_TABLET | ORAL | Status: DC
  Filled 2021-01-28: qty 3

## 2021-01-28 NOTE — ED Notes (Signed)
Back from MRI at this time

## 2021-01-28 NOTE — ED Notes (Signed)
Patient transported to CT 

## 2021-01-28 NOTE — Discharge Instructions (Signed)
As discussed, it is important you follow-up with our neurology colleagues in about 1 week to ensure that your condition is improved.  Return here for concerning changes in your condition.

## 2021-01-28 NOTE — ED Notes (Signed)
Handoff report given to [facility] 

## 2021-01-28 NOTE — ED Notes (Signed)
Patient brought in by EMS from [place] to get an MRI. MRI just called and should be on the way to pick her up soon.

## 2021-01-28 NOTE — ED Notes (Signed)
Handoff care report given to [facility]

## 2021-01-28 NOTE — ED Provider Notes (Signed)
12:59 PM Patient seen on evaluation on arrival.  She continues to exhibit facial droop.  She has no other complaints.  3:09 PM Patient in no distress, no new complaints. I reviewed her MRI, discussed with our neurology colleagues.  No evidence for acute stroke, given constellation of symptoms, some suspicion for Bell's palsy.  Patient appropriate for initiation of therapy here, follow-up with neurology as needed.  Patient comfortable plan and acknowledges return precautions.   Carmin Muskrat, MD 01/28/21 [phone]

## 2021-01-28 NOTE — ED Notes (Signed)
Patient sent to MRI at this time  

## 2021-01-28 NOTE — ED Notes (Signed)
Awaiting valtrex to come from pharmacy so we can discharge

## 2021-01-28 NOTE — ED Notes (Signed)
[facility] at bedside 

## 2021-01-28 NOTE — ED Provider Notes (Addendum)
[place] EMERGENCY DEPT Provider Note   CSN: [id number] Arrival date & time: 01/28/21  9735     History Chief Complaint  Patient presents with  . Facial Droop    Bonnie Hall is a 60 y.o. female.  Patient with left eye drooping first noticed by husband yesterday morning at 7 in the morning last known normal at 2100 Monday night.  Patient had some slurred speech last week and had some coordination problems that she thought could be related to tardive dyskinesia related to some of her medications.  That is all resolved.  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  Did have that done a week ago.  Past medical history is significant for history of migraines but no headache currently.  Sleep apnea anxiety depression hyperlipidemia and asthma.        Past Medical History:  Diagnosis Date  . Allergy    Hay fever/exercise induced asthma  . AMA (advanced maternal age) multigravida 81+   . Anxiety   . Asthma   . Blood transfusion 1990's  . Depression    bipolar  . GERD (gastroesophageal reflux disease)   . Hyperlipidemia   . Iron deficiency anemia 1990's   negative work up as to etiology; required iron infusion, blood transfusion.  S[facility] an hematologist  at [facility].   . Migraine   . Newborn product of in vitro fertilization (IVF) pregnancy   . Pregnancy induced hypertension   . Preterm labor   . Sleep apnea   . Thrombocytosis   . Vaginal Pap smear, abnormal     Patient Active Problem List   Diagnosis Date Noted  . Iron deficiency anemia 09/10/2020  . Aortic atherosclerosis ([place]) 01/30/2020  . Stress at home 10/01/2019  . Caloric malnutrition ([place]) 10/01/2019  . Mixed hyperlipidemia 07/31/2019  . S/P cesarean section 03/09/2017  . Abnormal glucose 01/16/2015  . Vitamin D deficiency 01/14/2014  . Medication management 01/14/2014  . Labile hypertension 01/14/2014  . Allergy   . Sleep apnea   . Migraine   . Bipolar  disorder (manic depression) ([place])   . GERD (gastroesophageal reflux disease)     Past Surgical History:  Procedure Laterality Date  . CESAREAN SECTION  2009  . CESAREAN SECTION N/A 03/09/2017   Procedure: REPEAT CESAREAN SECTION;  Surgeon: Dian Queen, MD;  Location: [facility];  Service: Obstetrics;  Laterality: N/A;  . COLONOSCOPY  06/2012   neg. with [place]  . GYNECOLOGIC CRYOSURGERY    . NASAL SEPTUM SURGERY  1983  . TONSILLECTOMY  1966     OB History    Gravida  2   Para  2   Term  1   Preterm  1   AB      Living  3     SAB      IAB      Ectopic      Multiple  1   Live Births  3           Family History  Problem Relation Age of Onset  . Colon cancer Paternal Uncle 67  . Colon cancer Paternal Uncle 56  . Prostate cancer Father   . Arrhythmia Father   . Stroke Maternal Grandmother   . Stroke Maternal Grandfather   . Hearing loss Daughter   . Strabismus Daughter   . Asperger's syndrome Daughter   . ADD / ADHD Daughter   . Intellectual disability Daughter   . Rheum arthritis Paternal  Grandmother   . Bipolar disorder Son     Social History   Tobacco Use  . Smoking status: Never Smoker  . Smokeless tobacco: Never Used  Vaping Use  . Vaping Use: Never used  Substance Use Topics  . Alcohol use: No  . Drug use: No    Home Medications Prior to Admission medications   Medication Sig Start Date End Date Taking? Authorizing Provider  aspirin EC 81 MG tablet Take 1 tablet (81 mg total) by mouth daily. Patient taking differently: Take 81 mg by mouth at bedtime. 01/16/15   Unk Pinto, MD  busPIRone (BUSPAR) 10 MG tablet Take 10 mg by mouth as needed.    [provider]  butalbital-acetaminophen-caffeine (FIORICET) 50-325-40 MG tablet TAKE 1 TO 2 TABLETS EVERY 6 HOURS AS NEEDED ONSET OF HEADACHE. MAX 6 TABS PER EVENT. MAX 30 A MONTH 08/13/20   Liane Comber, NP  CLONAZEPAM PO Take by mouth as needed.    [provider]  DimenhyDRINATE (MOTION SICKNESS RELIEF PO) Take 1-2 tablets by mouth at bedtime as needed (motion sickness).    [provider]  esomeprazole (NEXIUM) 40 MG capsule Take 1 capsule (40 mg total) by mouth daily. 08/13/20   Liane Comber, NP  fluticasone (FLONASE) 50 MCG/ACT nasal spray Place 2 sprays into both nostrils daily as needed for allergies. 07/24/18   Garnet Sierras, NP  lamoTRIgine (LAMICTAL) 150 MG tablet Take 200 mg at bedtime by mouth.     [provider]  lurasidone (LATUDA) 20 MG TABS tablet Take 40 mg at bedtime by mouth.     [provider]  Melatonin 3 MG CAPS Take 6 mg by mouth at bedtime.     [provider]  montelukast (SINGULAIR) 10 MG tablet Take 1 tablet (10 mg total) by mouth at bedtime. 08/13/20   Liane Comber, NP  promethazine (PHENERGAN) 25 MG tablet Take 1 tablet (25 mg total) by mouth every 6 (six) hours as needed for nausea or vomiting (can cause fatigue). Max: 4 tablets per day 01/15/21   Liane Comber, NP  vortioxetine HBr (TRINTELLIX) 20 MG TABS tablet Take 20 mg daily by mouth.    [provider]    Allergies    Patient has no known allergies.  Review of Systems   Review of Systems  Constitutional: Negative for chills and fever.  HENT: Negative for congestion, rhinorrhea and sore throat.   Eyes: Negative for visual disturbance.  Respiratory: Negative for cough and shortness of breath.   Cardiovascular: Negative for chest pain and leg swelling.  Gastrointestinal: Negative for abdominal pain, diarrhea, nausea and vomiting.  Genitourinary: Negative for dysuria.  Musculoskeletal: Negative for back pain and neck pain.  Skin: Negative for rash.  Neurological: Positive for facial asymmetry. Negative for dizziness, light-headedness and headaches.  Hematological: Does not bruise/bleed easily.  Psychiatric/Behavioral: Negative for confusion.    Physical Exam Updated Vital Signs BP (!) 115/48    Pulse 72   Temp 98.8 F (37.1 C) (Oral)   Resp 20   Ht 1.676 m (5\' 6" )   Wt 57.6 kg   LMP 03/12/2012   SpO2 100%   BMI 20.50 kg/m   Physical Exam Vitals and nursing note reviewed.  Constitutional:      General: She is not in acute distress.    Appearance: Normal appearance. She is well-developed.  HENT:     Head: Normocephalic and atraumatic.  Eyes:     Extraocular Movements: Extraocular movements intact.  Conjunctiva/sclera: Conjunctivae normal.     Pupils: Pupils are equal, round, and reactive to light.  Cardiovascular:     Rate and Rhythm: Normal rate and regular rhythm.     Heart sounds: No murmur heard.   Pulmonary:     Effort: Pulmonary effort is normal. No respiratory distress.     Breath sounds: Normal breath sounds.  Abdominal:     Palpations: Abdomen is soft.     Tenderness: There is no abdominal tenderness.  Musculoskeletal:        General: No swelling. Normal range of motion.     Cervical back: Normal range of motion and neck supple.  Skin:    General: Skin is warm and dry.     Capillary Refill: Capillary refill takes less than 2 seconds.  Neurological:     Mental Status: She is alert.     Cranial Nerves: Cranial nerve deficit present.     Sensory: No sensory deficit.     Motor: No weakness.     Coordination: Coordination normal.     Comments: Patient with drooping of the left eyelid.  And some left facial drooping.  Forehead is spared.  No weakness to the forehead area.  Ruling out Bell's palsy.     ED Results / Procedures / Treatments   Labs (all labs ordered are listed, but only abnormal results are displayed) Labs Reviewed  CBC - Abnormal; Notable for the following components:      Result Value   Hemoglobin 11.2 (*)    Platelets 490 (*)    All other components within normal limits  COMPREHENSIVE METABOLIC PANEL - Abnormal; Notable for the following components:   Glucose, Bld 106 (*)    AST 11 (*)    Total Bilirubin 0.2 (*)    All other  components within normal limits  URINALYSIS, ROUTINE W REFLEX MICROSCOPIC - Abnormal; Notable for the following components:   Hgb urine dipstick SMALL (*)    Protein, ur TRACE (*)    Bacteria, UA RARE (*)    All other components within normal limits  RESP PANEL BY RT-PCR (FLU A&B, COVID) ARPGX2  ETHANOL  PROTIME-INR  APTT  DIFFERENTIAL  RAPID URINE DRUG SCREEN, HOSP PERFORMED    EKG EKG Interpretation  Date/Time:  Wednesday Jan 28 2021 09:21:36 EDT Ventricular Rate:  85 PR Interval:  115 QRS Duration: 97 QT Interval:  373 QTC Calculation: 444 R Axis:   81 Text Interpretation: Sinus rhythm Borderline short PR interval RSR' in V1 or V2, right VCD or RVH Confirmed by Fredia Sorrow [phone]) on 01/28/2021 9:33:44 AM   Radiology CT HEAD WO CONTRAST  Result Date: 01/28/2021 CLINICAL DATA:  Neuro deficit, acute, stroke suspected. Additional history provided: Left eye drooping. Slurred speech last week. EXAM: CT HEAD WITHOUT CONTRAST TECHNIQUE: Contiguous axial images were obtained from the base of the skull through the vertex without intravenous contrast. COMPARISON:  Brain MRI 04/07/2011. FINDINGS: Brain: Cerebral volume is normal for age. There is no acute intracranial hemorrhage. No demarcated cortical infarct. No extra-axial fluid collection. No evidence of intracranial mass. No midline shift. Vascular: No hyperdense vessel. Skull: Normal. Negative for fracture or focal lesion. Sinuses/Orbits: Visualized orbits show no acute finding. Small fluid levels within the bilateral sphenoid sinuses. IMPRESSION: Unremarkable non-contrast CT appearance of the brain. No evidence of acute intracranial abnormality. Bilateral sphenoid sinusitis. Electronically Signed   By: Kellie Simmering DO   On: 01/28/2021 09:58    Procedures Procedures   CRITICAL CARE  Performed by: Fredia Sorrow Total critical care time: 35 minutes Critical care time was exclusive of separately billable procedures and treating  other patients. Critical care was necessary to treat or prevent imminent or life-threatening deterioration. Critical care was time spent personally by me on the following activities: development of treatment plan with patient and/or surrogate as well as nursing, discussions with consultants, evaluation of patient's response to treatment, examination of patient, obtaining history from patient or surrogate, ordering and performing treatments and interventions, ordering and review of laboratory studies, ordering and review of radiographic studies, pulse oximetry and re-evaluation of patient's condition.   Medications Ordered in ED Medications - No data to display  ED Course  I have reviewed the triage vital signs and the nursing notes.  Pertinent labs & imaging results that were available during my care of the patient were reviewed by me and considered in my medical decision making (see chart for details).    MDM Rules/Calculators/A&P                          Patient last known normal at about 2100 on Monday.  Patient presenting with left-sided facial droop more prominent with droop of the left eyelid.  But there is appears to be some drooping of the mouth.  No other neurodeficits.  Forehead is spared.  So does not appear to be consistent with a Bell's palsy.  Patient is out of the window for tPA and out of the window for any interventional treatment.  Of significance is CT head without any acute findings.  Patient has had a brain MRI in July 2012 that can be used for comparison.  However I feel she does need MRI brain to further evaluate and if has abnormal findings may need to have discussion with the neuro hospitalist.  We will transfer patient into [facility] for MRI brain.  Patient has no contraindications to MRI.  Patient's labs without any significant abnormalities.  COVID testing and influenza testing negative.  Discussed with Dr. Adrian Prows emergency physician at [facility].  He is excepted  patient patient will be transferred in for MRI.  MRI has been ordered.   Final Clinical Impression(s) / ED Diagnoses Final diagnoses:  Facial droop    Rx / DC Orders ED Discharge Orders    None       Fredia Sorrow, MD 01/28/21 1139    Fredia Sorrow, MD 01/28/21 1143

## 2021-01-28 NOTE — ED Triage Notes (Signed)
Left eye drooping first noticed by husband yesterday at 0700 upon awaking, last known normal 2100 Monday night.  Patient had some slurred speech last week lasting "a few days that I thought could be tardive dyskinesia related to some of my medications".

## 2021-02-03 ENCOUNTER — Other Ambulatory Visit: Payer: Self-pay

## 2021-02-03 ENCOUNTER — Ambulatory Visit (INDEPENDENT_AMBULATORY_CARE_PROVIDER_SITE_OTHER): Admitting: Adult Health

## 2021-02-03 ENCOUNTER — Encounter: Payer: Self-pay | Admitting: Adult Health

## 2021-02-03 ENCOUNTER — Encounter: Payer: Self-pay | Admitting: Neurology

## 2021-02-03 VITALS — BP 108/68 | HR 81 | Temp 97.2°F | Wt 134.0 lb

## 2021-02-03 DIAGNOSIS — G510 Bell's palsy: Secondary | ICD-10-CM

## 2021-02-03 HISTORY — DX: Bell's palsy: G51.0

## 2021-02-03 MED ORDER — PREDNISONE 20 MG PO TABS: ORAL_TABLET | ORAL | 0 refills | Status: DC

## 2021-02-03 NOTE — Patient Instructions (Signed)
Bell's Palsy, Adult  Bell's palsy is a short-term inability to move muscles in a part of the face. The inability to move, also called paralysis, results from inflammation or compression of the seventh cranial nerve. This nerve travels along the skull and under the ear to the side of the face. This nerve is responsible for facial movements that include blinking, closing the eyes, smiling, and frowning. What are the causes? The exact cause of this condition is not known. It may be caused by an infection from a virus, such as the chickenpox (herpes zoster), Epstein-Barr, or mumps virus. What increases the risk? You are more likely to develop this condition if:  You are pregnant.  You have diabetes.  You have had a recent infection in your nose, throat, or airways.  You have a weakened body defense system (immune system).  You have had a facial injury, such as a fracture.  You have a family history of Bell's palsy. What are the signs or symptoms? Symptoms of this condition include:  Weakness on one side of the face.  Drooping eyelid and corner of the mouth.  Excessive tearing in one eye.  Difficulty closing the eyelid.  Dry eye.  Drooling.  Dry mouth.  Changes in taste.  Change in facial appearance.  Pain behind one ear.  Ringing in one or both ears.  Sensitivity to sound in one ear.  Facial twitching.  Headache.  Impaired speech.  Dizziness.  Difficulty eating or drinking. Most of the time, only one side of the face is affected. In rare cases, Bell's palsy may affect the whole face. How is this diagnosed? This condition is diagnosed based on:  Your symptoms.  Your medical history.  A physical exam. You may also have to see health care providers who specialize in disorders of the nerves (neurologist) or diseases and conditions of the eye (ophthalmologist). You may have tests, such as:  A test to check for nerve damage (electromyogram).  Imaging  studies, such as a CT scan or an MRI.  Blood tests. How is this treated? This condition affects every person differently. Sometimes symptoms go away without treatment within a couple weeks. If treatment is needed, it varies from person to person. The goal of treatment is to reduce inflammation and protect the eye from damage. Treatment for Bell's palsy may include:  Medicines, such as: ? Steroids to reduce swelling and inflammation. ? Antiviral medicines. ? Pain relievers, including aspirin, acetaminophen, or ibuprofen.  Eye drops or ointment to keep your eye moist.  Eye protection, if you cannot close your eye.  Exercises or massage to regain muscle strength and function (physical therapy). Follow these instructions at home:  Take over-the-counter and prescription medicines only as told by your health care provider.  If your eye is affected: ? Keep your eye moist with eye drops or ointment as told by your health care provider. ? Follow instructions for eye care and protection as told by your health care provider.  Do any physical therapy exercises as told by your health care provider.  Keep all follow-up visits. This is important.   Contact a health care provider if:  You have a fever or chills.  Your symptoms do not get better within 2-3 weeks, or your symptoms get worse.  Your eye is red, irritated, or painful.  You have new symptoms. Get help right away if:  You have weakness or numbness in a part of your body other than your face.  You  have trouble swallowing.  You develop neck pain or stiffness.  You develop dizziness or shortness of breath. Summary  Bell's palsy is a short-term inability to move muscles in a part of the face. The inability to move results from inflammation or compression of the facial nerve.  This condition affects every person differently. Sometimes symptoms go away without treatment within a couple weeks.  If treatment is needed, it varies  from person to person. The goal of treatment is to reduce inflammation and protect the eye from damage.  Contact your health care provider if your symptoms do not get better within 2-3 weeks, or your symptoms get worse. This information is not intended to replace advice given to you by your health care provider. Make sure you discuss any questions you have with your health care provider. Document Revised: 05/29/2020 Document Reviewed: 05/29/2020 Elsevier Patient Education  2021 [employer].

## 2021-02-03 NOTE — Progress Notes (Signed)
Assessment and Plan:  Bonnie Hall was seen today for bells palsy.  Diagnoses and all orders for this visit:  Left-sided Bell's palsy/facial drooping With unremarkable workup in ED with CT/MRI normal  Some atypical presentation with forehead spared Also note hx of botox injections, limited to eye area by [facility] ophth - will recommend to hold off on any further for now Last day of prednisone with no improvement thus far, will try extending course with a taper, may take up to 3 weeks for benefit, will follow up in another 2 weeks Discussed monitoring for eye closure, taper eye if needed though appears able to close spontaneously on exam today  She was referred for neuro but not until August 10/2020 - if no improvement will try to refer to other neuro sooner to discuss further steps patient to go to ER if there is new weakness, thunderclap headache, visual changes, or any concerning factors -     predniSONE (DELTASONE) 20 MG tablet; 3 tablets daily with food for 3 days, 2 tabs daily for 3 days, 1 tab a day for 5 days.   Further disposition pending results of labs. Discussed med's effects and SE's.   Over 30 minutes of exam, counseling, chart review, and critical decision making was performed.   Future Appointments  Date Time Provider [place]  03/12/2021 10:30 AM Liane Comber, NP [facility] None  04/14/2021  9:10 AM Pieter Partridge, DO [facility] None  09/09/2021 10:00 AM Liane Comber, NP [facility] None    ------------------------------------------------------------------------------------------------------------------   HPI 60 y.o.female presents for ED follow up for R sided Bell's Palsy.   She was seen in ED 01/28/2021 for L sided facial drooping that started 1-2 days prior, possibly longer per patient. She had some slurring/stumbling/coordination problems the week prior, attributed to mood med adjustments (psych follows for hx of severe depression/bipolar, on lamictal, trintellix  and had dose increase the week prior, has since resumed previous dosing). At ED had no other neuro deficits but noted forehead WAS SPARED, atypical for Bell's palsy and underwent CT head without acute findings, followed by MRI brain which was also unremarkable, comparison from 03/2011. Labs were unremarkable.   Patient does note the week prior she had typical botox injections by [facility] ophth for blepharospasm, reports has been getting regularly for several years. Hx of migraine but denied HA at time of onset.   She was prescribed prednisone 60 mg x 6 days and valtrex 1000 mg TID x 1 week, last day of prednisone today, denies notable improvement since treatment. She notes dry mouth. Is able to close eye, denies dry eye or foreign body sensation.   She is scheduled to see neuro Dr. Tomi Likens, 04/14/2021  Past Medical History:  Diagnosis Date  . Allergy    Hay fever/exercise induced asthma  . AMA (advanced maternal age) multigravida 22+   . Anxiety   . Asthma   . Blood transfusion 1990's  . Depression    bipolar  . GERD (gastroesophageal reflux disease)   . Hyperlipidemia   . Iron deficiency anemia 1990's   negative work up as to etiology; required iron infusion, blood transfusion.  Saw an hematologist  at [facility].   . Migraine   . Newborn product of in vitro fertilization (IVF) pregnancy   . Pregnancy induced hypertension   . Preterm labor   . Sleep apnea   . Thrombocytosis   . Vaginal Pap smear, abnormal      No Known Allergies  Current Outpatient Medications on  File Prior to Visit  Medication Sig  . aspirin EC 81 MG tablet Take 1 tablet (81 mg total) by mouth daily. (Patient taking differently: Take 81 mg by mouth at bedtime.)  . busPIRone (BUSPAR) 10 MG tablet Take 10 mg by mouth as needed.  . butalbital-acetaminophen-caffeine (FIORICET) 50-325-40 MG tablet TAKE 1 TO 2 TABLETS EVERY 6 HOURS AS NEEDED ONSET OF HEADACHE. MAX 6 TABS PER EVENT. MAX 30 A MONTH  . CLONAZEPAM PO Take by mouth as  needed.  . DimenhyDRINATE (MOTION SICKNESS RELIEF PO) Take 1-2 tablets by mouth at bedtime as needed (motion sickness).  Marland Kitchen esomeprazole (NEXIUM) 40 MG capsule Take 1 capsule (40 mg total) by mouth daily.  . fluticasone (FLONASE) 50 MCG/ACT nasal spray Place 2 sprays into both nostrils daily as needed for allergies.  Marland Kitchen lamoTRIgine (LAMICTAL) 150 MG tablet Take 200 mg at bedtime by mouth.   . lurasidone (LATUDA) 20 MG TABS tablet Take 40 mg at bedtime by mouth.   . Melatonin 3 MG CAPS Take 6 mg by mouth at bedtime.   . montelukast (SINGULAIR) 10 MG tablet Take 1 tablet (10 mg total) by mouth at bedtime.  . promethazine (PHENERGAN) 25 MG tablet Take 1 tablet (25 mg total) by mouth every 6 (six) hours as needed for nausea or vomiting (can cause fatigue). Max: 4 tablets per day  . valACYclovir (VALTREX) 1000 MG tablet Take 1 tablet (1,000 mg total) by mouth 3 (three) times daily.  Marland Kitchen vortioxetine HBr (TRINTELLIX) 20 MG TABS tablet Take 10 mg by mouth daily.  . predniSONE (DELTASONE) 20 MG tablet Take 3 tablets (60 mg total) by mouth daily with breakfast for 6 days. For the next four days (Patient not taking: Reported on 02/03/2021)   No current facility-administered medications on file prior to visit.    ROS: all negative except above.   Physical Exam:  BP 108/68   Pulse 81   Temp (!) 97.2 F (36.2 C)   Wt 134 lb (60.8 kg)   LMP 03/12/2012   SpO2 98%   BMI 21.63 kg/m   General Appearance: Well nourished, in no apparent distress. Eyes: PERRLA, EOMs, conjunctiva no swelling or erythema Sinuses: No Frontal/maxillary tenderness ENT/Mouth: Ext aud canals clear, TMs without erythema, bulging. No erythema, swelling, or exudate on post pharynx.  Tonsils not swollen or erythematous. Hearing normal.  Neck: Supple, thyroid normal.  Respiratory: Respiratory effort normal, BS equal bilaterally without rales, rhonchi, wheezing or stridor.  Cardio: RRR with no MRGs. Brisk peripheral pulses without  edema.  Abdomen: Soft, + BS.  Non tender, no guarding, rebound, hernias, masses. Lymphatics: Non tender without lymphadenopathy.  Musculoskeletal: Full ROM, 5/5 strength, normal gait.  Skin: Warm, dry without rashes, lesions, ecchymosis.  Neuro: She has mild drooping of the left eyelid.  And some left facial drooping.  Forehead is spared.  No weakness to the forehead area. Cranial nerves otherwise intact, does have some movement of above areas. Normal muscle tone throughout body, no cerebellar symptoms. Sensation intact. Upper and lower extremity sensation intact, strength symmetrical. Normal gait.  Psych: Awake and oriented X 3, normal affect, Insight and Judgment appropriate.     Izora Ribas, NP 3:12 PM [facility]

## 2021-02-04 ENCOUNTER — Ambulatory Visit: Admitting: Adult Health

## 2021-02-14 ENCOUNTER — Other Ambulatory Visit: Payer: Self-pay

## 2021-02-14 ENCOUNTER — Emergency Department (HOSPITAL_BASED_OUTPATIENT_CLINIC_OR_DEPARTMENT_OTHER)
Admission: EM | Admit: 2021-02-14 | Discharge: 2021-02-14 | Disposition: A | Discharge status: Home / Self Care | Attending: Emergency Medicine | Admitting: Emergency Medicine

## 2021-02-14 ENCOUNTER — Encounter (HOSPITAL_BASED_OUTPATIENT_CLINIC_OR_DEPARTMENT_OTHER): Payer: Self-pay | Admitting: Emergency Medicine

## 2021-02-14 DIAGNOSIS — J45909 Unspecified asthma, uncomplicated: Secondary | ICD-10-CM | POA: Insufficient documentation

## 2021-02-14 DIAGNOSIS — L539 Erythematous condition, unspecified: Secondary | ICD-10-CM | POA: Insufficient documentation

## 2021-02-14 DIAGNOSIS — Z7982 Long term (current) use of aspirin: Secondary | ICD-10-CM | POA: Insufficient documentation

## 2021-02-14 DIAGNOSIS — J029 Acute pharyngitis, unspecified: Secondary | ICD-10-CM | POA: Insufficient documentation

## 2021-02-14 DIAGNOSIS — Z20822 Contact with and (suspected) exposure to covid-19: Secondary | ICD-10-CM | POA: Insufficient documentation

## 2021-02-14 LAB — GROUP A STREP BY PCR: Group A Strep by PCR: NOT DETECTED

## 2021-02-14 MED ORDER — DEXAMETHASONE 6 MG PO TABS
10.0000 mg | ORAL_TABLET | Freq: Once | ORAL | Status: AC
  Administered 2021-02-14: 10 mg via ORAL
  Filled 2021-02-14: qty 1

## 2021-02-14 MED ORDER — NYSTATIN 100000 UNIT/ML MT SUSP: 500000.0000 [IU] | Freq: Four times a day (QID) | OROMUCOSAL | 0 refills | Status: DC

## 2021-02-14 NOTE — ED Triage Notes (Signed)
Pt report a sore throat with some difficulty swallowing for the past few weeks

## 2021-02-14 NOTE — ED Provider Notes (Addendum)
[facility] EMERGENCY DEPT Provider Note   CSN: [id number] Arrival date & time: 02/14/21  1627     History Chief Complaint  Patient presents with  . Sore Throat    Bonnie Hall is a 60 y.o. female.  The history is provided by the patient.  Sore Throat This is a new problem. The problem occurs constantly. The problem has not changed since onset.Pertinent negatives include no chest pain, no abdominal pain, no headaches and no shortness of breath. Nothing aggravates the symptoms. Nothing relieves the symptoms. She has tried nothing for the symptoms. The treatment provided no relief.       Past Medical History:  Diagnosis Date  . Allergy    Hay fever/exercise induced asthma  . AMA (advanced maternal age) multigravida 14+   . Anxiety   . Asthma   . Blood transfusion 1990's  . Depression    bipolar  . GERD (gastroesophageal reflux disease)   . Hyperlipidemia   . Iron deficiency anemia 1990's   negative work up as to etiology; required iron infusion, blood transfusion.  Saw an hematologist  at [facility].   . Migraine   . Newborn product of in vitro fertilization (IVF) pregnancy   . Pregnancy induced hypertension   . Preterm labor   . Sleep apnea   . Thrombocytosis   . Vaginal Pap smear, abnormal     Patient Active Problem List   Diagnosis Date Noted  . Left-sided Bell's palsy 02/03/2021  . Iron deficiency anemia 09/10/2020  . Aortic atherosclerosis ([place]) 01/30/2020  . Stress at home 10/01/2019  . Caloric malnutrition ([place]) 10/01/2019  . Mixed hyperlipidemia 07/31/2019  . S/P cesarean section 03/09/2017  . Abnormal glucose 01/16/2015  . Vitamin D deficiency 01/14/2014  . Medication management 01/14/2014  . Labile hypertension 01/14/2014  . Allergy   . Sleep apnea   . Migraine   . Bipolar disorder (manic depression) ([place])   . GERD (gastroesophageal reflux disease)     Past Surgical History:  Procedure Laterality Date  . CESAREAN SECTION  2009   . CESAREAN SECTION N/A 03/09/2017   Procedure: REPEAT CESAREAN SECTION;  Surgeon: Dian Queen, MD;  Location: [place];  Service: Obstetrics;  Laterality: N/A;  . COLONOSCOPY  06/2012   neg. with [place]  . GYNECOLOGIC CRYOSURGERY    . NASAL SEPTUM SURGERY  1983  . TONSILLECTOMY  1966     OB History    Gravida  2   Para  2   Term  1   Preterm  1   AB      Living  3     SAB      IAB      Ectopic      Multiple  1   Live Births  3           Family History  Problem Relation Age of Onset  . Colon cancer Paternal Uncle 43  . Colon cancer Paternal Uncle 57  . Prostate cancer Father   . Arrhythmia Father   . Stroke Maternal Grandmother   . Stroke Maternal Grandfather   . Hearing loss Daughter   . Strabismus Daughter   . Asperger's syndrome Daughter   . ADD / ADHD Daughter   . Intellectual disability Daughter   . Rheum arthritis Paternal Grandmother   . Bipolar disorder Son     Social History   Tobacco Use  . Smoking status: Never Smoker  . Smokeless tobacco: Never Used  Vaping Use  . Vaping Use: Never used  Substance Use Topics  . Alcohol use: No  . Drug use: No    Home Medications Prior to Admission medications   Medication Sig Start Date End Date Taking? Authorizing Provider  aspirin EC 81 MG tablet Take 1 tablet (81 mg total) by mouth daily. Patient taking differently: Take 81 mg by mouth at bedtime. 01/16/15   Unk Pinto, MD  busPIRone (BUSPAR) 10 MG tablet Take 10 mg by mouth as needed.    [provider]  butalbital-acetaminophen-caffeine (FIORICET) 50-325-40 MG tablet TAKE 1 TO 2 TABLETS EVERY 6 HOURS AS NEEDED ONSET OF HEADACHE. MAX 6 TABS PER EVENT. MAX 30 A MONTH 08/13/20   Liane Comber, NP  CLONAZEPAM PO Take by mouth as needed.    [provider]  DimenhyDRINATE (MOTION SICKNESS RELIEF PO) Take 1-2 tablets by mouth at bedtime as needed (motion sickness).    [provider]  esomeprazole  (NEXIUM) 40 MG capsule Take 1 capsule (40 mg total) by mouth daily. 08/13/20   Liane Comber, NP  fluticasone (FLONASE) 50 MCG/ACT nasal spray Place 2 sprays into both nostrils daily as needed for allergies. 07/24/18   Garnet Sierras, NP  lamoTRIgine (LAMICTAL) 150 MG tablet Take 200 mg at bedtime by mouth.     [provider]  lurasidone (LATUDA) 20 MG TABS tablet Take 40 mg at bedtime by mouth.     [provider]  Melatonin 3 MG CAPS Take 6 mg by mouth at bedtime.     [provider]  montelukast (SINGULAIR) 10 MG tablet Take 1 tablet (10 mg total) by mouth at bedtime. 08/13/20   Liane Comber, NP  predniSONE (DELTASONE) 20 MG tablet 3 tablets daily with food for 3 days, 2 tabs daily for 3 days, 1 tab a day for 5 days. 02/03/21 02/14/21  Liane Comber, NP  promethazine (PHENERGAN) 25 MG tablet Take 1 tablet (25 mg total) by mouth every 6 (six) hours as needed for nausea or vomiting (can cause fatigue). Max: 4 tablets per day 01/15/21   Liane Comber, NP  valACYclovir (VALTREX) 1000 MG tablet Take 1 tablet (1,000 mg total) by mouth 3 (three) times daily. 01/28/21   Carmin Muskrat, MD  vortioxetine HBr (TRINTELLIX) 20 MG TABS tablet Take 10 mg by mouth daily.    [provider]    Allergies    Patient has no known allergies.  Review of Systems   Review of Systems  Constitutional: Negative for fever.  HENT: Positive for sore throat. Negative for congestion, dental problem, drooling, ear pain, facial swelling, mouth sores, postnasal drip, sinus pressure, trouble swallowing and voice change.   Respiratory: Negative for shortness of breath.   Cardiovascular: Negative for chest pain.  Gastrointestinal: Negative for abdominal pain.  Neurological: Negative for headaches.    Physical Exam Updated Vital Signs BP 121/70 (BP Location: Right Arm)   Pulse 75   Temp 98.9 F (37.2 C) (Oral)   Resp 17   Ht 5\' 6"  (1.676 m)   Wt 57.6 kg   LMP 03/12/2012    SpO2 99%   BMI 20.50 kg/m   Physical Exam Constitutional:      General: She is not in acute distress.    Appearance: She is not ill-appearing.  HENT:     Head: Normocephalic and atraumatic.     Nose: No congestion.     Mouth/Throat:     Mouth: Mucous membranes are moist. No oral lesions.  Pharynx: Oropharyngeal exudate and posterior oropharyngeal erythema present. No pharyngeal swelling.     Tonsils: No tonsillar exudate. 0 on the right. 0 on the left.  Eyes:     Conjunctiva/sclera: Conjunctivae normal.  Neurological:     Mental Status: She is alert.     ED Results / Procedures / Treatments   Labs (all labs ordered are listed, but only abnormal results are displayed) Labs Reviewed  SARS CORONAVIRUS 2 (TAT 6-24 HRS)  GROUP A STREP BY PCR    EKG None  Radiology No results found.  Procedures Procedures   Medications Ordered in ED Medications  dexamethasone (DECADRON) tablet 10 mg (10 mg Oral Given 02/14/21 1717)    ED Course  I have reviewed the triage vital signs and the nursing notes.  Pertinent labs & imaging results that were available during my care of the patient were reviewed by me and considered in my medical decision making (see chart for details).    MDM Rules/Calculators/A&P                          LADDIE NAEEM is here with sore throat.  Ongoing for a week or 2.  She is concerned about possibly thrush but does not have any evidence of thrush on exam.  There is some mild erythema but no obvious abscess or deep space infection.  No trismus, no drooling, no fever.  Will test for COVID and strep.  Given a dose of Decadron in case it is allergy related.  We will call in antibiotic if needed if strep is positive.  Suspect this could also be reflux and recommend follow-up with her primary care doctor symptoms do not improve.  Discharged in good condition.  Called back to room and overall shared decision was made to treat her for thrush.  Recommend  follow-up primary care doctor symptoms do not improve.  This chart was dictated using voice recognition software.  Despite best efforts to proofread,  errors can occur which can change the documentation meaning.    Final Clinical Impression(s) / ED Diagnoses Final diagnoses:  Sore throat    Rx / DC Orders ED Discharge Orders    None       Lennice Sites, DO 02/14/21 [place], [place], DO 02/14/21 1830

## 2021-02-15 LAB — SARS CORONAVIRUS 2 (TAT 6-24 HRS): SARS Coronavirus 2: NEGATIVE

## 2021-02-16 ENCOUNTER — Telehealth: Payer: Self-pay

## 2021-02-16 NOTE — Telephone Encounter (Signed)
Went to Urgent Care over the weekend and was given Decadron but isn't feeling better. Please advise.

## 2021-02-17 NOTE — Telephone Encounter (Signed)
Patient states that they only gave her one tablet of the Decadron and if this seemed to be working, to call her PCP and they would need to write a new prescription. States that her tongue feels swollen and hurts really bad along with having a sore throat. Has been doing allergy meds and  saline nasal sprays. Advised her to try the Maalox and liquid Benedryl. Do you want to send in a new prescription for a steroid taper?

## 2021-02-18 ENCOUNTER — Other Ambulatory Visit: Payer: Self-pay | Admitting: Adult Health

## 2021-02-18 MED ORDER — DEXAMETHASONE 1 MG PO TABS: ORAL_TABLET | ORAL | 0 refills | Status: DC

## 2021-02-21 NOTE — Progress Notes (Signed)
Assessment and Plan:  Bonnie Hall was seen today for follow-up.  Diagnoses and all orders for this visit:  Left-sided Bell's palsy Resolved with extension of steroid She plans to keep scheduled appointment with neuro to discuss concerns with family hx of Alzheimer's She did have normal recent neuroimaging  Family history of dementia Lifestyle discussed; mediterranean/MIND diet reviewed Stress management techniques discussed, increase water, good sleep hygiene discussed, increase exercise, and increase veggies.   Fatigue/craving ice -     CBC with Differential/Platelet -     Iron, TIBC and Ferritin Panel  Further disposition pending results of labs. Discussed med's effects and SE's.   Over 15 minutes of exam, counseling, chart review, and critical decision making was performed.   Future Appointments  Date Time Provider [place]  03/12/2021 10:30 AM Liane Comber, NP [facility] None  04/14/2021  9:10 AM Pieter Partridge, DO [facility] None  09/09/2021 10:00 AM Liane Comber, NP [facility] None    ------------------------------------------------------------------------------------------------------------------   HPI 60 y.o.female presents for ED follow up for L facial weakness felt Bell's Palsy by neuro.   She was seen in ED 01/28/2021 for L sided facial drooping that started 1-2 days prior, possibly longer per patient. She had some slurring/stumbling/coordination problems the week prior, attributed to mood med adjustments (psych follows for hx of severe depression/bipolar, on lamictal, trintellix and had dose increase the week prior, has since resumed previous dosing). At ED had no other neuro deficits but noted forehead WAS SPARED, atypical for Bell's palsy and underwent CT head without acute findings, followed by MRI brain which was also unremarkable, comparison from 03/2011. Labs were unremarkable.   She was prescribed prednisone 60 mg x 6 days and valtrex 1000 mg TID x 1 week,  denied notable improvement since treatment and we extended steroid taper and additional 10 days by taper. She reports sx have since improved/resolved to baseline.   She is scheduled to see neuro Dr. Tomi Likens, 04/14/2021. She also plans to discuss her concerns with risk for dementia, mom with advanced alzheimer's.   She requests CBC and iron check today, hx of iron def anemia, has been fatigued and craving ice.    Past Medical History:  Diagnosis Date   Allergy    Hay fever/exercise induced asthma   AMA (advanced maternal age) multigravida 35+    Anxiety    Asthma    Blood transfusion 1990's   Depression    bipolar   GERD (gastroesophageal reflux disease)    Hyperlipidemia    Iron deficiency anemia 1990's   negative work up as to etiology; required iron infusion, blood transfusion.  Saw an hematologist  at [facility].    Left-sided Bell's palsy 02/03/2021   Migraine    Newborn product of in vitro fertilization (IVF) pregnancy    Pregnancy induced hypertension    Preterm labor    Sleep apnea    Thrombocytosis    Vaginal Pap smear, abnormal      No Known Allergies  Current Outpatient Medications on File Prior to Visit  Medication Sig   aspirin EC 81 MG tablet Take 1 tablet (81 mg total) by mouth daily. (Patient taking differently: Take 81 mg by mouth at bedtime.)   busPIRone (BUSPAR) 10 MG tablet Take 10 mg by mouth as needed.   butalbital-acetaminophen-caffeine (FIORICET) 50-325-40 MG tablet TAKE 1 TO 2 TABLETS EVERY 6 HOURS AS NEEDED ONSET OF HEADACHE. MAX 6 TABS PER EVENT. MAX 30 A MONTH   CLONAZEPAM PO Take by mouth as  needed.   dexamethasone (DECADRON) 1 MG tablet Take 3 tabs for 3 days, 2 tabs for 3 days 1 tab for 5 days. Take with food.   DimenhyDRINATE (MOTION SICKNESS RELIEF PO) Take 1-2 tablets by mouth at bedtime as needed (motion sickness).   esomeprazole (NEXIUM) 40 MG capsule Take 1 capsule (40 mg total) by mouth daily.   fluticasone (FLONASE) 50 MCG/ACT nasal spray Place 2  sprays into both nostrils daily as needed for allergies.   lamoTRIgine (LAMICTAL) 150 MG tablet Take 200 mg at bedtime by mouth.    lurasidone (LATUDA) 20 MG TABS tablet Take 40 mg at bedtime by mouth.    Melatonin 3 MG CAPS Take 6 mg by mouth at bedtime.    montelukast (SINGULAIR) 10 MG tablet Take 1 tablet (10 mg total) by mouth at bedtime.   nystatin (MYCOSTATIN) 100000 UNIT/ML suspension Take 5 mLs (500,000 Units total) by mouth 4 (four) times daily.   promethazine (PHENERGAN) 25 MG tablet Take 1 tablet (25 mg total) by mouth every 6 (six) hours as needed for nausea or vomiting (can cause fatigue). Max: 4 tablets per day   valACYclovir (VALTREX) 1000 MG tablet Take 1 tablet (1,000 mg total) by mouth 3 (three) times daily.   vortioxetine HBr (TRINTELLIX) 20 MG TABS tablet Take 10 mg by mouth daily.   No current facility-administered medications on file prior to visit.    ROS: all negative except above.   Physical Exam:  BP 110/64   Pulse 86   Temp (!) 97.3 F (36.3 C)   Ht 5\' 6"  (1.676 m)   Wt 127 lb (57.6 kg)   LMP 03/12/2012   SpO2 98%   BMI 20.50 kg/m   General Appearance: Well nourished, in no apparent distress. Eyes: PERRLA, EOMs, conjunctiva no swelling or erythema Sinuses: No Frontal/maxillary tenderness ENT/Mouth: Ext aud canals clear, TMs without erythema, bulging. No erythema, swelling, or exudate on post pharynx.  Tonsils not swollen or erythematous. Hearing normal.  Neck: Supple, thyroid normal.  Respiratory: Respiratory effort normal, BS equal bilaterally without rales, rhonchi, wheezing or stridor.  Cardio: RRR with no MRGs. Brisk peripheral pulses without edema.  Abdomen: Soft, + BS.  Non tender, no guarding, rebound, hernias, masses. Lymphatics: Non tender without lymphadenopathy.  Musculoskeletal: Full ROM, 5/5 strength, normal gait.  Skin: Warm, dry without rashes, lesions, ecchymosis.  Neuro: Cranial nerves II-XII intact, Normal muscle tone throughout  body, no cerebellar symptoms. Sensation intact. Upper and lower extremity sensation intact, strength symmetrical. Normal gait.  Psych: Awake and oriented X 3, normal affect, Insight and Judgment appropriate.     Izora Ribas, NP 2:01 PM [facility]

## 2021-02-23 ENCOUNTER — Encounter: Payer: Self-pay | Admitting: Adult Health

## 2021-02-23 ENCOUNTER — Other Ambulatory Visit: Payer: Self-pay

## 2021-02-23 ENCOUNTER — Ambulatory Visit (INDEPENDENT_AMBULATORY_CARE_PROVIDER_SITE_OTHER): Admitting: Adult Health

## 2021-02-23 VITALS — BP 110/64 | HR 86 | Temp 97.3°F | Ht 66.0 in | Wt 127.0 lb

## 2021-02-23 DIAGNOSIS — R5383 Other fatigue: Secondary | ICD-10-CM

## 2021-02-23 DIAGNOSIS — G510 Bell's palsy: Secondary | ICD-10-CM

## 2021-02-23 DIAGNOSIS — E611 Iron deficiency: Secondary | ICD-10-CM | POA: Diagnosis not present

## 2021-02-23 NOTE — Patient Instructions (Signed)
Mediterranean Diet A Mediterranean diet refers to food and lifestyle choices that are based on the traditions of countries located on the [place] Sea. This way of eating has been shown to help prevent certain conditions and improve outcomes for people who have chronic diseases, like kidney disease and heart disease. What are tips for following this plan? Lifestyle  Cook and eat meals together with your family, when possible.  Drink enough fluid to keep your urine clear or pale yellow.  Be physically active every day. This includes: ? Aerobic exercise like running or swimming. ? Leisure activities like gardening, walking, or housework.  Get 7-8 hours of sleep each night.  If recommended by your health care provider, drink red wine in moderation. This means 1 glass a day for nonpregnant women and 2 glasses a day for men. A glass of wine equals 5 oz (150 mL). Reading food labels  Check the serving size of packaged foods. For foods such as rice and pasta, the serving size refers to the amount of cooked product, not dry.  Check the total fat in packaged foods. Avoid foods that have saturated fat or trans fats.  Check the ingredients list for added sugars, such as corn syrup.   Shopping  At the grocery store, buy most of your food from the areas near the walls of the store. This includes: ? Fresh fruits and vegetables (produce). ? Grains, beans, nuts, and seeds. Some of these may be available in unpackaged forms or large amounts (in bulk). ? Fresh seafood. ? Poultry and eggs. ? Low-fat dairy products.  Buy whole ingredients instead of prepackaged foods.  Buy fresh fruits and vegetables in-season from local farmers markets.  Buy frozen fruits and vegetables in resealable bags.  If you do not have access to quality fresh seafood, buy precooked frozen shrimp or canned fish, such as tuna, salmon, or sardines.  Buy small amounts of raw or cooked vegetables, salads, or olives from  the deli or salad bar at your store.  Stock your pantry so you always have certain foods on hand, such as olive oil, canned tuna, canned tomatoes, rice, pasta, and beans. Cooking  Cook foods with extra-virgin olive oil instead of using butter or other vegetable oils.  Have meat as a side dish, and have vegetables or grains as your main dish. This means having meat in small portions or adding small amounts of meat to foods like pasta or stew.  Use beans or vegetables instead of meat in common dishes like chili or lasagna.  Experiment with different cooking methods. Try roasting or broiling vegetables instead of steaming or sauteing them.  Add frozen vegetables to soups, stews, pasta, or rice.  Add nuts or seeds for added healthy fat at each meal. You can add these to yogurt, salads, or vegetable dishes.  Marinate fish or vegetables using olive oil, lemon juice, garlic, and fresh herbs. Meal planning  Plan to eat 1 vegetarian meal one day each week. Try to work up to 2 vegetarian meals, if possible.  Eat seafood 2 or more times a week.  Have healthy snacks readily available, such as: ? Vegetable sticks with hummus. ? Greek yogurt. ? Fruit and nut trail mix.  Eat balanced meals throughout the week. This includes: ? Fruit: 2-3 servings a day ? Vegetables: 4-5 servings a day ? Low-fat dairy: 2 servings a day ? Fish, poultry, or lean meat: 1 serving a day ? Beans and legumes: 2 or more servings a week ?   Nuts and seeds: 1-2 servings a day ? Whole grains: 6-8 servings a day ? Extra-virgin olive oil: 3-4 servings a day  Limit red meat and sweets to only a few servings a month   What are my food choices?  Mediterranean diet ? Recommended  Grains: Whole-grain pasta. Brown rice. Bulgar wheat. Polenta. Couscous. Whole-wheat bread. Oatmeal. Quinoa.  Vegetables: Artichokes. Beets. Broccoli. Cabbage. Carrots. Eggplant. Green beans. Chard. Kale. Spinach. Onions. Leeks. Peas. Squash.  Tomatoes. Peppers. Radishes.  Fruits: Apples. Apricots. Avocado. Berries. Bananas. Cherries. Dates. Figs. Grapes. Lemons. Melon. Oranges. Peaches. Plums. Pomegranate.  Meats and other protein foods: Beans. Almonds. Sunflower seeds. Pine nuts. Peanuts. Cod. Salmon. Scallops. Shrimp. Tuna. Tilapia. Clams. Oysters. Eggs.  Dairy: Low-fat milk. Cheese. Greek yogurt.  Beverages: Water. Red wine. Herbal tea.  Fats and oils: Extra virgin olive oil. Avocado oil. Grape seed oil.  Sweets and desserts: Greek yogurt with honey. Baked apples. Poached pears. Trail mix.  Seasoning and other foods: Basil. Cilantro. Coriander. Cumin. Mint. Parsley. Sage. Rosemary. Tarragon. Garlic. Oregano. Thyme. Pepper. Balsalmic vinegar. Tahini. Hummus. Tomato sauce. Olives. Mushrooms. ? Limit these  Grains: Prepackaged pasta or rice dishes. Prepackaged cereal with added sugar.  Vegetables: Deep fried potatoes (french fries).  Fruits: Fruit canned in syrup.  Meats and other protein foods: Beef. Pork. Lamb. Poultry with skin. Hot dogs. Bacon.  Dairy: Ice cream. Sour cream. Whole milk.  Beverages: Juice. Sugar-sweetened soft drinks. Beer. Liquor and spirits.  Fats and oils: Butter. Canola oil. Vegetable oil. Beef fat (tallow). Lard.  Sweets and desserts: Cookies. Cakes. Pies. Candy.  Seasoning and other foods: Mayonnaise. Premade sauces and marinades. The items listed may not be a complete list. Talk with your dietitian about what dietary choices are right for you. Summary  The Mediterranean diet includes both food and lifestyle choices.  Eat a variety of fresh fruits and vegetables, beans, nuts, seeds, and whole grains.  Limit the amount of red meat and sweets that you eat.  Talk with your health care provider about whether it is safe for you to drink red wine in moderation. This means 1 glass a day for nonpregnant women and 2 glasses a day for men. A glass of wine equals 5 oz (150 mL). This information  is not intended to replace advice given to you by your health care provider. Make sure you discuss any questions you have with your health care provider. Document Revised: 04/29/2016 Document Reviewed: 04/22/2016 Elsevier Patient Education  2020 Elsevier Inc.  

## 2021-02-24 LAB — CBC WITH DIFFERENTIAL/PLATELET
Absolute Monocytes: 391 cells/uL (ref 200–950)
Basophils Absolute: 28 cells/uL (ref 0–200)
Basophils Relative: 0.4 %
Eosinophils Absolute: 28 cells/uL (ref 15–500)
Eosinophils Relative: 0.4 %
HCT: 33.2 % — ABNORMAL LOW (ref 35.0–45.0)
Hemoglobin: 10.8 g/dL — ABNORMAL LOW (ref 11.7–15.5)
Lymphs Abs: 1136 cells/uL (ref 850–3900)
MCH: 28 pg (ref 27.0–33.0)
MCHC: 32.5 g/dL (ref 32.0–36.0)
MCV: 86 fL (ref 80.0–100.0)
MPV: 10 fL (ref 7.5–12.5)
Monocytes Relative: 5.5 %
Neutro Abs: 5517 cells/uL (ref 1500–7800)
Neutrophils Relative %: 77.7 %
Platelets: 487 10*3/uL — ABNORMAL HIGH (ref 140–400)
RBC: 3.86 10*6/uL (ref 3.80–5.10)
RDW: 14.6 % (ref 11.0–15.0)
Total Lymphocyte: 16 %
WBC: 7.1 10*3/uL (ref 3.8–10.8)

## 2021-02-24 LAB — IRON,TIBC AND FERRITIN PANEL
%SAT: 8 % (calc) — ABNORMAL LOW (ref 16–45)
Ferritin: 6 ng/mL — ABNORMAL LOW (ref 16–232)
Iron: 31 ug/dL — ABNORMAL LOW (ref 45–160)
TIBC: 401 mcg/dL (calc) (ref 250–450)

## 2021-03-04 ENCOUNTER — Other Ambulatory Visit: Payer: Self-pay | Admitting: Adult Health

## 2021-03-04 ENCOUNTER — Telehealth: Payer: Self-pay

## 2021-03-04 DIAGNOSIS — G44219 Episodic tension-type headache, not intractable: Secondary | ICD-10-CM

## 2021-03-04 MED ORDER — BUTALBITAL-APAP-CAFFEINE 50-325-40 MG PO TABS: ORAL_TABLET | ORAL | 2 refills | Status: DC

## 2021-03-04 NOTE — Telephone Encounter (Signed)
Refill request for Fioricet.

## 2021-03-12 ENCOUNTER — Ambulatory Visit: Admitting: Adult Health

## 2021-04-06 ENCOUNTER — Telehealth: Payer: Self-pay | Admitting: Adult Health

## 2021-04-06 ENCOUNTER — Other Ambulatory Visit: Payer: Self-pay | Admitting: Adult Health

## 2021-04-06 NOTE — Telephone Encounter (Signed)
patient called office to request a referral be faxed to [facility]- drive thru testing-  to test her for COVID. Patient states family returned from vacation and 2 of her children are positive for COVID-19. She needs to be tested, she is requesting a referral faxed to [phone] [facility]- drive thru testing site. This is were she and her children will be treated. They require a referral. Please advise your recommendation.

## 2021-04-07 ENCOUNTER — Other Ambulatory Visit: Payer: Self-pay | Admitting: Adult Health

## 2021-04-07 LAB — SARS CORONAVIRUS 2 (TAT 6-24 HRS): SARS Coronavirus 2: POSITIVE — AB

## 2021-04-07 NOTE — Telephone Encounter (Signed)
Left message on machine to call referral to [facility] faxed.

## 2021-04-13 NOTE — Progress Notes (Signed)
Assessment/Plan:   Bonnie Hall is  a 60 y.o. year old RH female with risk factors including  age, labile hypertension, hyperlipidemia, ASCAD, manic bipolar disorder, GAD, Vit D deficiency, sleep apnea, hisotry of migraines,  Iron deficiency anemia,  and  seen today for evaluation of memory loss. MoCA today is 29/30  with deficiencies only in delayed recall  4/5, orientation  6/6 . Positive history of Alzheimer's Dementia in mother. Recent MRI of the brain negative for acute findings.    Recommendations:   Memory Concerns  MRI brain with/without contrast to assess for underlying structural abnormality and assess vascular load  Neurocognitive testing to further evaluate cognitive concerns and determine underlying cause of memory changes, including potential contribution from sleep, anxiety, or depression  Check B12, TSH Discussed safety both in and out of the home.  Discussed the importance of regular daily schedule with inclusion of crossword puzzles to maintain brain function.  Continue to monitor mood with PCP.  Stay active at least 30 minutes at least 3 times a week.  Naps should be scheduled and should be no longer than 60 minutes and should not occur after 2 PM.  Mediterranean diet is recommended  Folllow up if symptoms worsen. Patient does not have plans of returning after the results, unless her memory worsens.  Subjective:    The patient is seen in neurologic consultation at the request of Carmin Muskrat, MD for the evaluation of memory.  The patient is here alone.  She is a  60 y.o. year old RH female who has had memory issues for about 1 year, when she began to forget where she placed objects, especially the cell phone, misplacing it in different locations, most recently on top of the stove, or wondering what she was doing when she got to her room.  She denies repeating or asking the same questions.  She feels that her mood is depressed, she is often sad and anxious.   She is very stressed raising her twin 70 year olds, who apparently have a "hold on me ".  The patient is under the care of the psychiatrist regarding these issues, which she reports, seems to help.  She denies any irritability.  She sleeps fairly well, waking up at most once a night.  She does have vivid dreams, but denies acting out on them, or sleepwalking.  Denies hallucinations or paranoia.  She is independent of bathing and dressing.  The patient is doing her own finances without missing any payments.  She denies missing any medication doses.  Her appetite is good, denies trouble swallowing.  She cooks and denies leaving the stove or the faucet on.  She ambulates without difficulty without the use of a walker or a cane.  She drives with GPS, denies getting lost.  She has a hard time focusing when driving though, especially over the last 2 months. Denies recent headaches, falls, or injuries to the head, double vision, dizziness, focal numbness or tingling, unilateral weakness or tremors. Denies urine incontinence or retention. Denies constipation or diarrhea.  She has a recent history of left Bell's palsy, for which she took prednisone about 2 months ago, and "cleared right away ".  Denies anosmia. Denies history of OSA, ETOH or tobacco.  She has college degree, from [employer], on interior architecture.  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  Family history remarkable for mother diagnosed in [place] 1 year ago  with Alzheimer's disease.    MRI brain 01/28/21 wo contrast  Unremarkable non-contrast MRI appearance of the brain for age. No evidence of acute intracranial abnormality  Allergies  Allergen Reactions   Codeine     Current Outpatient Medications  Medication Instructions   aspirin EC 81 mg, Oral, Daily   busPIRone (BUSPAR) 10 mg, Oral, As needed   butalbital-acetaminophen-caffeine (FIORICET) 50-325-40 MG tablet TAKE 1 TO 2  TABLETS EVERY 6 HOURS AS NEEDED ONSET OF HEADACHE. MAX 6 TABS PER EVENT. MAX 30 A MONTH   CLONAZEPAM PO Oral, As needed   dexamethasone (DECADRON) 1 MG tablet Take 3 tabs for 3 days, 2 tabs for 3 days 1 tab for 5 days. Take with food.   DimenhyDRINATE (MOTION SICKNESS RELIEF PO) 1-2 tablets, Oral, At bedtime PRN   esomeprazole (NEXIUM) 40 mg, Oral, Daily   fluticasone (FLONASE) 50 MCG/ACT nasal spray 2 sprays, Each Nare, Daily PRN   lamoTRIgine (LAMICTAL) 200 mg, Oral, Daily at bedtime   lurasidone (LATUDA) 40 mg, Oral, Daily at bedtime   Melatonin 6 mg, Oral, Daily at bedtime   montelukast (SINGULAIR) 10 mg, Oral, Daily at bedtime   nystatin (MYCOSTATIN) 500,000 Units, Oral, 4 times daily   promethazine (PHENERGAN) 25 mg, Oral, Every 6 hours PRN, Max: 4 tablets per day   valACYclovir (VALTREX) 1,000 mg, Oral, 3 times daily   vortioxetine HBr (TRINTELLIX) 10 mg, Oral, Daily     VITALS:   Vitals:   04/14/21 0900  BP: (!) 122/59  Pulse: 90  SpO2: 100%  Weight: 133 lb 6.4 oz (60.5 kg)  Height: '5\' 6"'$  (1.676 m)   Depression screen [facility] 2/9 09/09/2020 08/01/2019  Decreased Interest 3 3  Down, Depressed, Hopeless 3 3  PHQ - 2 Score 6 6  Altered sleeping 3 3  Tired, decreased energy 3 3  Change in appetite 3 3  Feeling bad or failure about yourself  3 3  Trouble concentrating 3 3  Moving slowly or fidgety/restless 3 1  Suicidal thoughts 0 0  PHQ-9 Score 24 22  Difficult doing work/chores Very difficult Very difficult    PHYSICAL EXAM   HEENT:  Normocephalic, atraumatic. The mucous membranes are moist. The superficial temporal arteries are without ropiness or tenderness. Cardiovascular: Regular rate and rhythm. Lungs: Clear to auscultation bilaterally. Neck: There are no carotid bruits noted bilaterally.  NEUROLOGICAL: Montreal Cognitive Assessment  04/14/2021  Visuospatial/ Executive (0/5) 5  Naming (0/3) 3  Attention: Read list of digits (0/2) 2  Attention: Read list of  letters (0/1) 1  Attention: Serial 7 subtraction starting at 100 (0/3) 3  Language: Repeat phrase (0/2) 2  Language : Fluency (0/1) 1  Abstraction (0/2) 2  Delayed Recall (0/5) 4  Orientation (0/6) 6  Total 29  Adjusted Score (based on education) 29    No flowsheet data found.   Orientation:  Alert and oriented to person, place and time. No aphasia or dysarthria. Fund of knowledge is appropriate. Recent memory and remote memory intact.  Attention and concentration are normal.  Able to name objects and repeat phrases. Delayed recall  4/5 Cranial nerves: There is good facial symmetry. Extraocular muscles are intact and visual fields are full to confrontational testing. Speech is fluent and clear. Soft palate rises symmetrically and there is no tongue deviation. Hearing is intact to conversational tone. Tone: Tone is good throughout. Sensation: Sensation is intact to light touch and pinprick throughout. Vibration is intact at the bilateral big toe.There is  no extinction with double simultaneous stimulation. There is no sensory dermatomal level identified. Coordination: The patient has no difficulty with RAM's or FNF bilaterally. Normal finger to nose  Motor: Strength is 5/5 in the bilateral upper and lower extremities. There is no pronator drift. There are no fasciculations noted. DTR's: Deep tendon reflexes are 2/4 at the bilateral biceps, triceps, brachioradialis, patella and achilles.  Plantar responses are downgoing bilaterally. Gait and Station: The patient is able to ambulate without difficulty.The patient is able to heel toe walk without any difficulty.The patient is able to ambulate in a tandem fashion. The patient is able to stand in the Romberg position.     Thank you for allowing [place] the opportunity to participate in the care of this nice patient. Please do not hesitate to contact us for any questions or concerns.   Total time spent on today's visit was 45 minutes, including both  face-to-face time and nonface-to-face time.  Time included that spent on review of records (prior notes available to me/labs/imaging if pertinent), discussing treatment and goals, answering patient's questions and coordinating care.  Cc:  Unk Pinto, MD  Sharene Butters 04/14/2021 11:50 AM

## 2021-04-14 ENCOUNTER — Encounter: Payer: Self-pay | Admitting: Physician Assistant

## 2021-04-14 ENCOUNTER — Ambulatory Visit (INDEPENDENT_AMBULATORY_CARE_PROVIDER_SITE_OTHER): Admitting: Physician Assistant

## 2021-04-14 ENCOUNTER — Other Ambulatory Visit: Payer: Self-pay

## 2021-04-14 ENCOUNTER — Other Ambulatory Visit (INDEPENDENT_AMBULATORY_CARE_PROVIDER_SITE_OTHER)

## 2021-04-14 VITALS — BP 122/59 | HR 90 | Ht 66.0 in | Wt 133.4 lb

## 2021-04-14 DIAGNOSIS — Z820 Family history of epilepsy and other diseases of the nervous system: Secondary | ICD-10-CM | POA: Diagnosis not present

## 2021-04-14 DIAGNOSIS — R413 Other amnesia: Secondary | ICD-10-CM

## 2021-04-14 LAB — TSH: TSH: 1.59 u[IU]/mL (ref 0.35–5.50)

## 2021-04-14 LAB — VITAMIN B12: Vitamin B-12: 533 pg/mL (ref 211–911)

## 2021-04-14 NOTE — Patient Instructions (Addendum)
It was a pleasure to see you today at our office.   Recommendations:  Neurocognitive evaluation at our office Check B12 and TSH at the lab Follow up as needed  [email or web address]  RECOMMENDATIONS FOR ALL PATIENTS WITH MEMORY PROBLEMS: 1. Continue to exercise (Recommend 30 minutes of walking everyday, or 3 hours every week) 2. Increase social interactions - continue going to [place] and enjoy social gatherings with friends and family 3. Eat healthy, avoid fried foods and eat more fruits and vegetables 4. Maintain adequate blood pressure, blood sugar, and blood cholesterol level. Reducing the risk of stroke and cardiovascular disease also helps promoting better memory. 5. Avoid stressful situations. Live a simple life and avoid aggravations. Organize your time and prepare for the next day in anticipation. 6. Sleep well, avoid any interruptions of sleep and avoid any distractions in the bedroom that may interfere with adequate sleep quality 7. Avoid sugar, avoid sweets as there is a strong link between excessive sugar intake, diabetes, and cognitive impairment We discussed the Mediterranean diet, which has been shown to help patients reduce the risk of progressive memory disorders and reduces cardiovascular risk. This includes eating fish, eat fruits and green leafy vegetables, nuts like almonds and hazelnuts, walnuts, and also use olive oil. Avoid fast foods and fried foods as much as possible. Avoid sweets and sugar as sugar use has been linked to worsening of memory function.  There is always a concern of gradual progression of memory problems. If this is the case, then we may need to adjust level of care according to patient needs. Support, both to the patient and caregiver, should then be put into place.      You have been referred for a neuropsychological evaluation (i.e., evaluation of memory and thinking abilities). Please bring someone  with you to this appointment if possible, as it is helpful for the doctor to hear from both you and another adult who knows you well. Please bring eyeglasses and hearing aids if you wear them.    The evaluation will take approximately 3 hours and has two parts:   The first part is a clinical interview with the neuropsychologist (Dr. Melvyn Novas or Dr. Nicole Kindred). During the interview, the neuropsychologist will speak with you and the individual you brought to the appointment.    The second part of the evaluation is testing with the doctor's technician Hinton Dyer or Maudie Mercury). During the testing, the technician will ask you to remember different types of material, solve problems, and answer some questionnaires. Your family member will not be present for this portion of the evaluation.   Please note: We must reserve several hours of the neuropsychologist's time and the psychometrician's time for your evaluation appointment. As such, there is a No-Show fee of $100. If you are unable to attend any of your appointments, please contact our office as soon as possible to reschedule.     Mediterranean Diet A Mediterranean diet refers to food and lifestyle choices that are based on the traditions of countries located on the [employer]. This way of eating has been shown to help prevent certain conditions and improve outcomes for people who have chronic diseases, like kidney disease and heart disease. What are tips for following this plan? Lifestyle  Cook and eat meals together with your family, when possible. Drink enough fluid to keep your urine clear or pale yellow. Be physically active every day. This includes: Aerobic exercise like running or swimming. Leisure activities like gardening, walking, or housework. Get 7-8  hours of sleep each night. If recommended by your health care provider, drink red wine in moderation. This means 1 glass a day for nonpregnant women and 2 glasses a day for men. A glass of wine  equals 5 oz (150 mL). Reading food labels  Check the serving size of packaged foods. For foods such as rice and pasta, the serving size refers to the amount of cooked product, not dry. Check the total fat in packaged foods. Avoid foods that have saturated fat or trans fats. Check the ingredients list for added sugars, such as corn syrup. Shopping  At the grocery store, buy most of your food from the areas near the walls of the store. This includes: Fresh fruits and vegetables (produce). Grains, beans, nuts, and seeds. Some of these may be available in unpackaged forms or large amounts (in bulk). Fresh seafood. Poultry and eggs. Low-fat dairy products. Buy whole ingredients instead of prepackaged foods. Buy fresh fruits and vegetables in-season from local farmers markets. Buy frozen fruits and vegetables in resealable bags. If you do not have access to quality fresh seafood, buy precooked frozen shrimp or canned fish, such as tuna, salmon, or sardines. Buy small amounts of raw or cooked vegetables, salads, or olives from the deli or salad bar at your store. Stock your pantry so you always have certain foods on hand, such as olive oil, canned tuna, canned tomatoes, rice, pasta, and beans. Cooking  Cook foods with extra-virgin olive oil instead of using butter or other vegetable oils. Have meat as a side dish, and have vegetables or grains as your main dish. This means having meat in small portions or adding small amounts of meat to foods like pasta or stew. Use beans or vegetables instead of meat in common dishes like chili or lasagna. Experiment with different cooking methods. Try roasting or broiling vegetables instead of steaming or sauteing them. Add frozen vegetables to soups, stews, pasta, or rice. Add nuts or seeds for added healthy fat at each meal. You can add these to yogurt, salads, or vegetable dishes. Marinate fish or vegetables using olive oil, lemon juice, garlic, and fresh  herbs. Meal planning  Plan to eat 1 vegetarian meal one day each week. Try to work up to 2 vegetarian meals, if possible. Eat seafood 2 or more times a week. Have healthy snacks readily available, such as: Vegetable sticks with hummus. Greek yogurt. Fruit and nut trail mix. Eat balanced meals throughout the week. This includes: Fruit: 2-3 servings a day Vegetables: 4-5 servings a day Low-fat dairy: 2 servings a day Fish, poultry, or lean meat: 1 serving a day Beans and legumes: 2 or more servings a week Nuts and seeds: 1-2 servings a day Whole grains: 6-8 servings a day Extra-virgin olive oil: 3-4 servings a day Limit red meat and sweets to only a few servings a month What are my food choices? Mediterranean diet Recommended Grains: Whole-grain pasta. Brown rice. Bulgar wheat. Polenta. Couscous. Whole-wheat bread. [place]. Vegetables: Artichokes. Beets. Broccoli. Cabbage. Carrots. Eggplant. Green beans. Chard. Kale. Spinach. Onions. Leeks. Peas. Squash. Tomatoes. Peppers. Radishes. Fruits: Apples. Apricots. Avocado. Berries. Bananas. Cherries. Dates. Figs. Grapes. Lemons. Melon. Oranges. Peaches. Plums. Pomegranate. Meats and other protein foods: Beans. Almonds. Sunflower seeds. Pine nuts. Peanuts. [place]. Salmon. Scallops. Shrimp. [place]. Tilapia. Clams. Oysters. Eggs. Dairy: Low-fat milk. Cheese. Greek yogurt. Beverages: Water. Red wine. Herbal tea. Fats and oils: Extra virgin olive oil. Avocado oil. Grape seed oil. Sweets and desserts: [place] yogurt with honey.  Baked apples. Poached pears. Trail mix. Seasoning and other foods: Basil. Cilantro. Coriander. Cumin. Mint. Parsley. Sage. Rosemary. Tarragon. Garlic. Oregano. Thyme. Pepper. Balsalmic vinegar. Tahini. Hummus. Tomato sauce. Olives. Mushrooms. Limit these Grains: Prepackaged pasta or rice dishes. Prepackaged cereal with added sugar. Vegetables: Deep fried potatoes (french fries). Fruits: Fruit canned in syrup. Meats and  other protein foods: Beef. Pork. Lamb. Poultry with skin. Hot dogs. Berniece Salines. Dairy: Ice cream. Sour cream. Whole milk. Beverages: Juice. Sugar-sweetened soft drinks. Beer. Liquor and spirits. Fats and oils: Butter. Canola oil. Vegetable oil. Beef fat (tallow). Lard. Sweets and desserts: Cookies. Cakes. Pies. Candy. Seasoning and other foods: Mayonnaise. Premade sauces and marinades. The items listed may not be a complete list. Talk with your dietitian about what dietary choices are right for you. Summary The Mediterranean diet includes both food and lifestyle choices. Eat a variety of fresh fruits and vegetables, beans, nuts, seeds, and whole grains. Limit the amount of red meat and sweets that you eat. Talk with your health care provider about whether it is safe for you to drink red wine in moderation. This means 1 glass a day for nonpregnant women and 2 glasses a day for men. A glass of wine equals 5 oz (150 mL). This information is not intended to replace advice given to you by your health care provider. Make sure you discuss any questions you have with your health care provider. Document Released: 04/22/2016 Document Revised: 05/25/2016 Document Reviewed: 04/22/2016 Elsevier Interactive Patient Education  2017 [employer].

## 2021-04-15 NOTE — Progress Notes (Signed)
Patient called an advised of labs, voiced understanding.

## 2021-05-25 ENCOUNTER — Encounter: Payer: Self-pay | Admitting: Counselor

## 2021-05-25 ENCOUNTER — Ambulatory Visit (INDEPENDENT_AMBULATORY_CARE_PROVIDER_SITE_OTHER): Admitting: Counselor

## 2021-05-25 ENCOUNTER — Ambulatory Visit

## 2021-05-25 ENCOUNTER — Other Ambulatory Visit: Payer: Self-pay

## 2021-05-25 DIAGNOSIS — F09 Unspecified mental disorder due to known physiological condition: Secondary | ICD-10-CM

## 2021-05-25 DIAGNOSIS — F32A Depression, unspecified: Secondary | ICD-10-CM

## 2021-05-25 DIAGNOSIS — F329 Major depressive disorder, single episode, unspecified: Secondary | ICD-10-CM

## 2021-05-25 DIAGNOSIS — G3184 Mild cognitive impairment, so stated: Secondary | ICD-10-CM | POA: Diagnosis not present

## 2021-05-25 NOTE — Progress Notes (Signed)
   Psychometrist Note   Cognitive testing was administered to Noel Christmas by Lamar Benes, B.S. (Technician) under the supervision of Alphonzo Severance, Psy.D., ABN. Ms. Harshman was able to tolerate all test procedures. Dr. Nicole Kindred met with the patient as needed to manage any emotional reactions to the testing procedures. Rest breaks were offered.    The battery of tests administered was selected by Dr. Nicole Kindred with consideration to the patient's current level of functioning, the nature of her symptoms, emotional and behavioral responses during the interview, level of literacy, observed level of motivation/effort, and the nature of the referral question. This battery was communicated to the psychometrist. Communication between Dr. Nicole Kindred and the psychometrist was ongoing throughout the evaluation and Dr. Nicole Kindred was immediately accessible at all times. Dr. Nicole Kindred provided supervision to the technician on the date of this service, to the extent necessary to assure the quality of all services provided.    Ms. Biffle will return in approximately one week for an interactive feedback session with Dr. Nicole Kindred, at which time test performance, clinical impressions, and treatment recommendations will be reviewed in detail. The patient understands she can contact our office should she require our assistance before this time.   A total of 145 minutes of billable time were spent with Noel Christmas by the technician, including test administration and scoring time. Billing for these services is reflected in Dr. Les Pou note.   This note reflects time spent with the psychometrician and does not include test scores, clinical history, or any interpretations made by Dr. Nicole Kindred. The full report will follow in a separate note.
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
## 2021-05-26 NOTE — Progress Notes (Signed)
[facility]  Patient Name: Bonnie Hall MRN: [id number] Date of Birth: November 14, 1960 Age: 60 y.o. Education: 16 years  Measurement properties of test scores: IQ, Index, and Standard Scores (SS): Mean = 100; Standard Deviation = 15 Scaled Scores (Ss): Mean = 10; Standard Deviation = 3 Z scores (Z): Mean = 0; Standard Deviation = 1 T scores (T); Mean = 50; Standard Deviation = 10  TEST SCORES:    Note: This summary of test scores accompanies the interpretive report and should not be interpreted by unqualified individuals or in isolation without reference to the report. Test scores are relative to age, gender, and educational history as available and appropriate.   Performance Validity        "A" Random Letter Test Raw  Descriptor      Errors 0 Within Expectation  The Dot Counting Test: 13 Within Expectation      Embedded Measures: Raw  Descriptor      NAB Effort Index 1 Within Expectation      Mental Status Screening     Total Score Descriptor  MoCA 29 Normal      Expected Functioning        [employer] Achievement Test: Standard/Scaled Score Percentile      Word Reading 103 58      [employer] Intellectual Screening Test Standard/T-score Percentile      Guess What 45 31      Odd Item Out 47 38  RIST Index 94 34      Attention/Processing Speed        Neuropsychological Assessment Battery (Attention Module, Form 1): Scaled/T-score Percentile      Digits Forward 36 8      Digits Backwards 38 12      Numbers & Letters A Speed 23 <1      Numbers & Letters A Accuracy 61 86      Numbers & Letters A Efficiency 22 <1      Numbers & Letters B Efficiency 44 27      Numbers & Letters C Efficiency 36 8      Numbers & Letters D Efficiency 34 5      Numbers & Letters D Disruption [id number]      Language        Neuropsychological Assessment Battery (Language Module, Form 1): T-score Percentile      Naming   (31) 53 62      Verbal Fluency:   T Score Percentile      Controlled Oral Word Association (F-A-S) 34 5      Semantic Fluency (Animals) 37 9      Memory:        Neuropsychological Assessment Battery (Memory Module, Form 1): T-score/Standard Score Percentile  Memory Index (MEM): 70 2      List Learning           List A Immediate Recall   (5 , 6 , 6) 29 2         List B Immediate Recall   (3) 34 5         List A Short Delayed Recall   (7) 41 18         List A Long Delayed Recall   (4) 26 1         List A Percent Retention   (57 %) --- 9         List A Long Delayed Yes/No Recognition Hits   (  10) --- 27         List A Long Delayed Yes/No Recognition False Alarms   (3) --- 38         List A Recognition Discriminability Index --- 31      Shape Learning           Immediate Recognition   (5 , 2 , 6) 36 8         Delayed Recognition   (5) 39 14         Percent Retention   (83 %) --- 27         Delayed Forced-Choice Recognition Hits   (9) --- 86         Delayed Forced-Choice Recognition False Alarms   (1) --- 38         Delayed Forced-Choice Recognition Discriminability --- 73      Story Learning           Immediate Recall   (31, 37) 50 50         Delayed Recall   (32) 42 21         Percent Retention   (86 %) --- 38      Daily Living Memory            Immediate Recall   (21, 16) 33 5          Delayed Recall   (7, 2) 19 <1          Percent Retention (64 %) --- 1          Recognition Hits   (8) --- 25      Visuospatial/Constructional Functioning        Neuropsychological Assessment Battery (Visuospatial Module) T-score Percentile      Figure Copy 43 25      Visual Discrimination 48 42      Executive Functioning        Modified [place] Card Sorting Test (MWCST): Standard/T-Score Percentile      Number of Categories Correct 52 58      Number of Perseverative Errors 58 79      Number of Total Errors 49 46      Percent Perseverative Errors 59 82  Executive Function Composite 108 70      Trail Making Test: T-Score  Percentile      Part A 37 9      Part B 46 34      Boston Diagnostic Aphasia Exam: Raw Score Scaled Score      Complex Ideational Material 12 12      Rating Scales         Raw Score Descriptor  Beck Depression Inventory - II 34 Severe  Beck Anxiety Inventory 20 Moderate      Clinical Dementia Rating Raw Score Descriptor      Sum of Boxes 0.5 Mild Cognitive Impairment      Global Score 0.5 MCI    Jordanny Waddington V. Nicole Kindred PsyD, [place] Clinical Neuropsychologist

## 2021-05-28 NOTE — Progress Notes (Signed)
[facility]  Patient Name: Bonnie Hall MRN: [id number] Date of Birth: [birth year] Age: 60 y.o. Education: 59 years  Clinical Impressions  Bonnie Hall is a 60 y.o., right-hand dominant, married woman with a history of depression/irritability, labile hypertension, HLD, CAD, OSA, migraine headaches, and subjective memory loss. She was referred by Sharene Butters, PA-C, whom she recently consulted with related to memory concerns. She notices fairly minimal day-to-day symptoms but her mother has dementia, which has been hard for her, so she wanted to get a baseline assessment. 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 She did complain on the day of the appointment that she had only slept several hours and was up all night with a migraine.   Neuropsychological testing shows less than expected performance on measures of attention/processing efficiency, memory, and on sensitive verbal fluency indicators that have processing speed and executive control components. Her memory difficulties are not particularly concerning for a storage problem and seem more consistent with attenuation due to primary difficulties with attention, processing speed, and executive control. The profile shows diminished encoding and weak retention of unstructured verbal information with much better retention of structured verbal information and visual information. She screened in the severe range for depressive symptoms, endorsing numerous self-critical statements as well as subjective mood symptoms. She scored in the moderate range for anxiety symptoms.   Bonnie Hall is thus demonstrating cognitive difficulties that can be summed up as involving  diminished processing efficiency and executive control. In her case, I think that these are most likely on the basis of an active depressive illness, medication side effects, and she also did not sleep well related to migraine the day prior to the assessment. For these reasons, I do not think a diagnosis of frank neurocognitive disorder is appropriate, because it's not clear the changes are enduring and I doubt they are at risk of further decline. The findings are not concerning for a progressive condition such as Alzheimer's. 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   Diagnostic Impressions: Other symptoms and signs involving cognitive functions and awareness Depressive disorder Irritability  Recommendations to be discussed with patient  Your performance and presentation on assessment were concerning for some cognitive changes. Rather than representing cognitive impairment at risk for further decline, such as findings concerning for Alzheimer's disease, I think that these changes reflect numerous reversible causes of modifiable risk factors and can improve over time. For that reason, the best diagnosis is other symptoms and signs involving cognitive functions and awareness.  First, you reported a severe level of depressive symptoms and clearly have had some difficulties with depression as of late.  Depression is extremely common.  Depression is also very treatable.  Depression is one of the most common causes of cognitive problems that I see in my outpatient practice in individuals who do not have an underlying neurological cause for the problems.  I would suggest that you assertively treat your depression, such as with medications and psychotherapy.  One caveat is that many medications  used to treat psychiatric difficulties (especially anxiety) can be brain impairing and result in diminished cognitive efficiency. Clonazepam and alprazolam, both benzodiazepine medications, in particular can cause memory problems. I wonder if these medications may be impacting your cognitive abilities. You should not change the manner in which you are taking any medications without first discussing it with the prescribing provider. You should also realize that these medications are prescribed for a reason, and it may not be possible to treat all your cognitive problems without having some side effects.  You reported that you have a very demanding schedule. I would encourage you to be patient with yourself. Not everybody is able to remember everything and get everything done in the limited time that we have during the day. This is especially true for childcare, which is extremely energy and resource intensive, and the stress from meeting such demands can certainly contribute to objective and subjective cognitive difficulties.  Difficulties of the sort that you describe and that you demonstrated on testing are often responsive to mindfulness. Most fundamentally, mindfulness involves taking a non-judgmental stance to the unfolding of the present moment and simply noticing your experience. When you are engaged in a task, this includes dedicating yourself to the task fully, without judgment, and simply redirecting yourself back to the task if you find your mind wandering. Various mindfulness practices including mindfulness based stress reduction, mindfulness medication, and the like have been shown to create subjective and objective improvements in cognitive function. You might consider picking up a copy of "Falling Awake: How to Practice Mindfulness in Everyday Life," by Lorelee Cover, Ph.D., who is one of the best known and most respected authors on the subject.  There is a significant research base and  evidence of effectiveness for something called "behavioral activation," which is a fancy way of saying that you should increase your activity level. In general, people do not feel as happy or do as well when they are not doing much. This can include things like getting out for walks, re-engaging in hobbies, spending time with family or friends, or learning a new hobby. It's not so important what you do as that you enjoy it and stick with it. Depression can start a vicious cycle where you are not doing a lot because you don't feel well, which leads to less things to be excited and happy about, and thus more depression and behavioral avoidance. It can be hard to change this pattern once it has started but most people find that they feel better when they start doing more even if they don't enjoy it at first.   As with all patients, I would recommend that you continue to engage in healthy lifestyle choices including a balanced heart-healthy, eat a brain healthy diet such as the [place] diet, get regular exercise (30 minutes at least 4 times a week), engage in satisfying social interactions with others, actively manage stress, and continue to treat underlying risk factors for cognitive impairment such as high blood pressure, high cholesterol, and sleep apnea.   Avoid overfocusing on cognitive performance. Memory and cognition are notoriously fallible and if you are looking for cognitive problems, you are bound to find them. Once someone gets worried about their memory and thinking, they may overfocus on how they are doing day-to-day, and then when normal day-to-day cognitive errors are made, this becomes a cause for more concern. This concern and anxiety then decreases focus from the task at hand, reducing concentration, causing more cognitive problems, and  creating a vicious cycle. Rather than critiquing your performance, I would encourage you to remain present minded and focus on the task at hand. Perhaps most  importantly, have reasonable expectations for yourself.  Healthy people forget things, lose focus, and do not perform 100% correctly all the time. Some cognitive errors are normal and are not necessarily a sign that there is something wrong with your brain.   Test Findings  Test scores are summarized in additional documentation associated with this encounter. Test scores are relative to age, gender, and educational history as available and appropriate. There were no concerns about performance validity as all findings fell within normal expectations.   General Intellectual Functioning/Achievement:  Performance on single word reading and the RIST index were average. There was little variability in component subtests on the RIST, suggesting the average is a good standard of comparison for the patient's cognitive test data.  Attention and Processing Efficiency: Measures of attention/processing efficiency fell below expectations for an individual of Bonnie Hall overall ability. The profile shows low scores on most measures. There is evidence of some speed accuracy trade off on timed measures; however, she did not make up in accuracy what she sacrificed in speed. This suggests that low scores are meaningful and not just a reflection of a different but equally valid test taking approach.  Digit repetition forward and backward hovered around the margin of the low average/unusually low ranges.  Performance was extremely to unusually low on three timed measures involving letter cancellation, mental addition under time pressure, and alternating attention between cancellation and mental addition under time pressure. She performed in the average range on one other measure involving letter counting only.   Language: Visual object confrontation naming was average.  By contrast, she generated weak scores hovering at the margin of the unusually low and low average ranges on sensitive verbal fluency indicators.  This  profile of verbal fluency findings is nonspecific.  Visuospatial Function: Performance of visuospatial/constructional measures was generally good, with a low average score for figure copy and an average score on a task of fine visual discrimination and matching to exemplar.  Learning and Memory: Performance on measures of learning and memory was below expectations. Overall performance fell at an unusually low level, with a pattern showing difficulties with acquisition and retention of unstructured verbal information and then relatively better retention of structured verbal information and visual information. This is a profile often observed when frontal-subcortical issues are attenuating memory performance, but the possibility of an emerging primary storage problem cannot be entirely ruled out..  In the verbal realm, Bonnie Hall learned 5, 6, and 6 words of a 12 item word list across 3 learning trials which is extremely low. She recalled 7 words on short delayed recall, which is low average, but then she only retained four words on delayed recall, which is extremely low.  Recognition discriminability for the words from the list versus foils was nevertheless good, falling at an average level. Memory for brief daily-living type information was unusually low on immediate recall and once again, retention of information was poor and delayed recall was extremely low. In this case, recognition cueing did not particularly benefit her performance. Short story learning was average followed by low average delayed recall with good retention of information over time.  In the visual realm, she learned 5, 2, and 6 shapes of a 9 shapes series that are difficult to verbally encode, which is unusually low immediate recall. Her delayed recognition for these shapes  was low average, and retention was decent, within the average range. She performed at an average (nearly high average) range on forced-choice recognition of these  designs, with a discriminability index of 8.   Executive Functions: Bonnie Hall generally fared well on dedicated measures of executive abilities, although  I would also note that her "memory" and other problems could be on the basis of executive control problems. She screened in the severe range for depressive symptoms and in the moderate range for anxiety symptoms.  Alternating sequencing of numbers and letters of the alphabet was average.  Performance on the executive function composition of the modest [place] card sorting sorting test was good, with a high average score for perseverative errors in an average score for a number of categories correct.  Reasoning with verbal information was errorless on the Complex Ideational Material.  Rating Scale(s): I rated a CDR for Bonnie Hall using all the information available to me at the time of the report.  Her sum of boxes score is 0.5, which is more consistent with mild cognitive impairment than it is with mild dementia.  Viviano Simas Nicole Kindred, PsyD, ABN Clinical Neuropsychologist  Coding and Compliance  Billing below reflects technician time, my direct face-to-face time with the patient, time spent in test administration, and time spent in professional activities including but not limited to: neuropsychological test interpretation, integration of neuropsychological test data with clinical history, report preparation, treatment planning, care coordination, and review of diagnostically pertinent medical history or studies.   Services associated with this encounter: Clinical Interview [phone]) plus 160 minutes ([id number]; Neuropsychological Evaluation by Professional)  145 minutes ([id number]; Neuropsychological Testing by Technician)

## 2021-06-01 ENCOUNTER — Encounter: Payer: Self-pay | Admitting: Counselor

## 2021-06-01 ENCOUNTER — Other Ambulatory Visit: Payer: Self-pay

## 2021-06-01 ENCOUNTER — Ambulatory Visit (INDEPENDENT_AMBULATORY_CARE_PROVIDER_SITE_OTHER): Admitting: Counselor

## 2021-06-01 DIAGNOSIS — F418 Other specified anxiety disorders: Secondary | ICD-10-CM | POA: Diagnosis not present

## 2021-06-01 NOTE — Patient Instructions (Signed)
Your performance and presentation on assessment were concerning for some cognitive changes. Rather than representing cognitive impairment at risk for further decline, such as findings concerning for Alzheimer's disease, I think that these changes reflect numerous reversible causes of modifiable risk factors and can improve over time. For that reason, the best diagnosis is other symptoms and signs involving cognitive functions and awareness.  First, you reported a severe level of depressive symptoms and clearly have had some difficulties with depression as of late.  Depression is extremely common.  Depression is also very treatable.  Depression is one of the most common causes of cognitive problems that I see in my outpatient practice in individuals who do not have an underlying neurological cause for the problems.  I would suggest that you assertively treat your depression, such as with medications and psychotherapy.  One caveat is that many medications used to treat psychiatric difficulties (especially anxiety) can be brain impairing and result in diminished cognitive efficiency. Clonazepam and alprazolam, both benzodiazepine medications, in particular can cause memory problems. I wonder if these medications may be impacting your cognitive abilities. You should not change the manner in which you are taking any medications without first discussing it with the prescribing provider. You should also realize that these medications are prescribed for a reason, and it may not be possible to treat all your cognitive problems without having some side effects.  You reported that you have a very demanding schedule. I would encourage you to be patient with yourself. Not everybody is able to remember everything and get everything done in the limited time that we have during the day. This is especially true for childcare, which is extremely energy and resource intensive, and the stress from meeting such demands can  certainly contribute to objective and subjective cognitive difficulties.  Difficulties of the sort that you describe and that you demonstrated on testing are often responsive to mindfulness. Most fundamentally, mindfulness involves taking a non-judgmental stance to the unfolding of the present moment and simply noticing your experience. When you are engaged in a task, this includes dedicating yourself to the task fully, without judgment, and simply redirecting yourself back to the task if you find your mind wandering. Various mindfulness practices including mindfulness based stress reduction, mindfulness medication, and the like have been shown to create subjective and objective improvements in cognitive function. You might consider picking up a copy of "Falling Awake: How to Practice Mindfulness in Everyday Life," by Lorelee Cover, Ph.D., who is one of the best known and most respected authors on the subject.   There is a significant research base and evidence of effectiveness for something called "behavioral activation," which is a fancy way of saying that you should increase your activity level. In general, people do not feel as happy or do as well when they are not doing much. This can include things like getting out for walks, re-engaging in hobbies, spending time with family or friends, or learning a new hobby. It's not so important what you do as that you enjoy it and stick with it. Depression can start a vicious cycle where you are not doing a lot because you don't feel well, which leads to less things to be excited and happy about, and thus more depression and behavioral avoidance. It can be hard to change this pattern once it has started but most people find that they feel better when they start doing more even if they don't enjoy it at first.  We also discussed the importance of sleep for cognition. There are few things as disruptive to brain functioning as not getting a good night's sleep. For  sleep, I recommend against using medications, which can have lingering sedating effects on the brain and rob your brain of restful REM sleep. Instead, consider trying some of the following sleep hygiene recommendations. They may not work at once and may take effort, but the effort you spend is likely to be rewarded with better sleep eventually:  Stick to a sleep schedule of the same bedtime and wake time even on the weekends, which can help to regulate your body's internal clock so that you fall asleep and stay asleep.  Practice a relaxing bedtime ritual (conducted away from bright lights) which will help separate your sleep from stimulating activities and prepare your body to fall asleep when you go to bed.  Avoid naps, especially in the afternoon.  Evaluate your room and create conditions that will promote sleep such as keeping it cool (between 60 - 67 degrees), quiet, and free from any lights. Consider using blackout curtains, a "white noise" generator, or fan that will help mask any noises that might prevent you from going to sleep or awaken you during the night.  Sleep on a comfortable mattress and pillows.  Avoid bright light in the evening and excessive use of portable electronic devices right before bed that may contain light frequencies that can contribute to sleep problems.  Avoid alcohol, cigarettes, or heavy meals in the evening. If you must eat, consume a light snack 45 minutes before bed.  Use your bed only for sleep to strengthen the association between your bed and sleep.  If you can't go to sleep within 30 minutes, go into another room and do something relaxing until you feel tired. Then, come back and try to go to sleep again for 30 minutes and repeat until sleep is achieved.  Some people find over the counter melatonin to be helpful for sleep, which you could discuss with a pharmacist or prescribing provider.   As with all patients, I would recommend that you continue to engage in  healthy lifestyle choices including a balanced heart-healthy, eat a brain healthy diet such as the [place] diet, get regular exercise (30 minutes at least 4 times a week), engage in satisfying social interactions with others, actively manage stress, and continue to treat underlying risk factors for cognitive impairment such as high blood pressure, high cholesterol, and sleep apnea.    Avoid overfocusing on cognitive performance. Memory and cognition are notoriously fallible and if you are looking for cognitive problems, you are bound to find them. Once someone gets worried about their memory and thinking, they may overfocus on how they are doing day-to-day, and then when normal day-to-day cognitive errors are made, this becomes a cause for more concern. This concern and anxiety then decreases focus from the task at hand, reducing concentration, causing more cognitive problems, and creating a vicious cycle. Rather than critiquing your performance, I would encourage you to remain present minded and focus on the task at hand. Perhaps most importantly, have reasonable expectations for yourself.  Healthy people forget things, lose focus, and do not perform 100% correctly all the time. Some cognitive errors are normal and are not necessarily a sign that there is something wrong with your brain.

## 2021-06-01 NOTE — Progress Notes (Signed)
NEUROPSYCHOLOGY FEEDBACK NOTE [facility]  Feedback Note: I met with Bonnie Hall to review the findings resulting from her neuropsychological evaluation. Since the last appointment, she has been about the same. She saw her mother over the weekend, who is unfortunately with advanced dementia and is no longer eating/drinking and is near the end of life from the sound of it. This is clearly hard for the patient, which we processed briefly. Time was spent reviewing the impressions and recommendations that are detailed in the evaluation report. We discussed impression of diminishment with respect to aspects of attention/processing efficiency (affecting memory function), as reflected in the patient instructions. I took time to explain the findings and answer all the patient's questions. I encouraged Bonnie Hall to contact me should she have any further questions or if further follow up is desired.   Current Medications and Medical History   Current Outpatient Medications  Medication Sig Dispense Refill   aspirin EC 81 MG tablet Take 1 tablet (81 mg total) by mouth daily. (Patient taking differently: Take 81 mg by mouth at bedtime.)     busPIRone (BUSPAR) 10 MG tablet Take 10 mg by mouth as needed.     butalbital-acetaminophen-caffeine (FIORICET) 50-325-40 MG tablet TAKE 1 TO 2 TABLETS EVERY 6 HOURS AS NEEDED ONSET OF HEADACHE. MAX 6 TABS PER EVENT. MAX 30 A MONTH 30 tablet 2   CLONAZEPAM PO Take by mouth as needed.     dexamethasone (DECADRON) 1 MG tablet Take 3 tabs for 3 days, 2 tabs for 3 days 1 tab for 5 days. Take with food. 20 tablet 0   DimenhyDRINATE (MOTION SICKNESS RELIEF PO) Take 1-2 tablets by mouth at bedtime as needed (motion sickness).     esomeprazole (NEXIUM) 40 MG capsule Take 1 capsule (40 mg total) by mouth daily. 90 capsule 1   fluticasone (FLONASE) 50 MCG/ACT nasal spray Place 2 sprays into both nostrils daily as needed for allergies. 1 g 5   lamoTRIgine  (LAMICTAL) 150 MG tablet Take 200 mg at bedtime by mouth.      lurasidone (LATUDA) 20 MG TABS tablet Take 40 mg at bedtime by mouth.      Melatonin 3 MG CAPS Take 6 mg by mouth at bedtime.      montelukast (SINGULAIR) 10 MG tablet Take 1 tablet (10 mg total) by mouth at bedtime. 90 tablet 3   nystatin (MYCOSTATIN) 100000 UNIT/ML suspension Take 5 mLs (500,000 Units total) by mouth 4 (four) times daily. 60 mL 0   promethazine (PHENERGAN) 25 MG tablet Take 1 tablet (25 mg total) by mouth every 6 (six) hours as needed for nausea or vomiting (can cause fatigue). Max: 4 tablets per day 30 tablet 0   valACYclovir (VALTREX) 1000 MG tablet Take 1 tablet (1,000 mg total) by mouth 3 (three) times daily. 21 tablet 0   vortioxetine HBr (TRINTELLIX) 20 MG TABS tablet Take 10 mg by mouth daily.     No current facility-administered medications for this visit.   Patient Active Problem List   Diagnosis Date Noted   Family history of Alzheimer's disease 04/14/2021   Iron deficiency anemia 09/10/2020   Aortic atherosclerosis ([place]) 01/30/2020   Stress at home 10/01/2019   Caloric malnutrition ([place]) 10/01/2019   Mixed hyperlipidemia 07/31/2019   S/P cesarean section 03/09/2017   Abnormal glucose 01/16/2015   Vitamin D deficiency 01/14/2014   Medication management 01/14/2014   Labile hypertension 01/14/2014   Allergy    Sleep apnea  Migraine    Bipolar disorder (manic depression) (HCC)    GERD (gastroesophageal reflux disease)    Mental Status and Behavioral Observations  Bonnie Hall presented on time to the present encounter and was alert and generally oriented. Speech was normal in rate, rhythm, volume, and prosody. Self-reported mood was "not the best" and affect was mood congruent, with some tearfulness when discussing mother. Thought process was logical and goal-oriented and thought content was appropriate. There were no safety concerns identified at today's encounter, such as thoughts of  harming self or others.   Plan  Feedback provided regarding the patient's neuropsychological evaluation. Bonnie Hall has numerous underlying potentially causes, not the least of which is her very demanding schedule caring for multiple children with various developmental issues and her husband who has PD and is quite limited. We discussed the importance of mindfulness, managing expectations and the like. Bonnie Hall was encouraged to contact me if any questions arise or if further follow up is desired.   Viviano Simas Nicole Kindred, PsyD, ABN Clinical Neuropsychologist  Service(s) Provided at This Encounter: 25 minutes [phone]; Psychotherapy with patient/family)

## 2021-06-30 ENCOUNTER — Other Ambulatory Visit: Payer: Self-pay | Admitting: Adult Health

## 2021-09-08 NOTE — Progress Notes (Signed)
Complete Physical  Assessment and Plan:  Encounter for Annual Physical Exam with abnormal findings Due annually  Health Maintenance reviewed Healthy lifestyle reviewed and goals set Follow up with GYN, schedule mammogram Check with pharmacy about shingrix  Labile hypertension Well controlled at this visit; intermittently elevated in the past; monitor Monitor blood pressure at home; call if consistently over 130/80 Continue DASH diet.   Reminder to go to the ER if any CP, SOB, nausea, dizziness, severe HA, changes vision/speech, left arm numbness and tingling and jaw pain. -     Magnesium  Gastroesophageal reflux disease, esophagitis presence not specified Well managed on current medications Discuss PPI taper next visit  Discussed diet, avoiding triggers and other lifestyle changes -     Magnesium  Other migraine without status migrainosus, not intractable Fioricet works well; reports recently improved Stress management techniques discussed, increase water, good sleep hygiene discussed, increase exercise, and increase veggies.  -     Magnesium  Allergic state, subsequent encounter       -      Continue meds; avoid triggers  Bipolar disorder, active, most recent episode depressed ([place])       -      Continue medications, follow up with Dr. Pauline Good and [facility] mood disorder clinic  Abnormal glucose -     Hemoglobin A1c  Vitamin D deficiency -     VITAMIN D 25 Hydroxy (Vit-D Deficiency, Fractures)  Medication management -     CBC with Differential/Platelet -     CMP/GFR -     Magnesium  -     UA  Cholesterol Currently mild elevations managed by lifestyle modification Continue low cholesterol diet and exercise.  Check lipid panel.  -      Lipid panel -      TSH (defer as just had at GYN and reports normal)  Iron deficiency anemia  -     CBC with Differential/Platelet -     Iron panel, ferritin  Screening for hematuria or proteinuria -     Urinalysis,  Complete (81001) -     Microalbumin   Screening for thyroid disorder -     TSH  B12 def  -    B12 - checked per patient preference  Orders Placed This Encounter  Procedures   CBC with Differential/Platelet   COMPLETE METABOLIC PANEL WITH GFR   Magnesium   Lipid panel   TSH   Hemoglobin A1c   VITAMIN D 25 Hydroxy (Vit-D Deficiency, Fractures)   Iron, TIBC and Ferritin Panel   Microalbumin / creatinine urine ratio   Urinalysis, Routine w reflex microscopic   Vitamin B12   EKG 12-Lead    Discussed med's effects and SE's. Screening labs and tests as requested with regular follow-up as recommended. Over 40 minutes of exam, counseling, chart review, and complex, high level critical decision making was performed this visit.   Future Appointments  Date Time Provider [place]  09/09/2022 10:00 AM Liane Comber, NP [facility] None     HPI  60 y.o. female  presents for a complete physical and follow up; she has Allergy; Sleep apnea; Migraine; Bipolar disorder (manic depression) ([place]); GERD (gastroesophageal reflux disease); Vitamin D deficiency; Medication management; Labile hypertension; Abnormal glucose; S/P cesarean section; Mixed hyperlipidemia; Stress at home; Caloric malnutrition ([place]); Aortic atherosclerosis ([place]); Iron deficiency anemia; and Family history of Alzheimer's disease on their problem list.   She is separated since the covid pandemic, however husband still lives  in same house (has health issues/PD), 3 children, 47 y/o twins (lots of stress, daughter not doing well with school), youngest is 59 years old. She is homemaker. Family is in [place] and not a lot of support.   Follows GYN Dr. Helane Rima- had appointment in 09/2021, goes annually, had normal PAP 07/2019.   Mother passed in September from Alzheimer's, feeling down and depressed, follows with psych, has started with new therapist and has upcoming grief counseling.   Had significant weight loss due to  very poor appetite and oral intake, had unremarkable labs and cancer screenings.   She also had L sided Bell's palsy this year, unremarkable imaging in ED, resolved with antiviral and prolonged steroid course.  Her mother has advanced alzheimer's, she was concerned about memory changes and saw neuro Dr. 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   She is followed by Dr. Santiago Glad Jones/ Terisa Starr for Bipolar depression/postpartum depression, followed by [facility] mood clinic. She is following with a counselor and finds this helpful. Currently on trintellix, latuda, lamictal and Buspar, taking daily.  She is on xanax/klonopin but with unintentional overdose requring ED visit in 04/2020, she reports has close follow up with mood provider, denies SI/HI.   She has CPAP machine and reports that she does not wear this as it is cumbersome and she is concerned she will not hear the children as she is sole caregiver for three children. Has lost significant weight since last sleep study. Declines pursuing follow up at this time.    She saw a chiropractor for HA and postpartum alignments which helped. She taking fioricet PRN, hasn't needed as much in recent months.  She reports reflux is well controlled with nexium for many years    Has allergies/hx of asthma improved with singulair, has albuterol but hasn't needed in years.   BMI is Body mass index is 20.36 kg/m., she has not been working on diet and exercise, admits very poor appetite since mom passing, does eat 2 meals/day.  Wt Readings from Last 3 Encounters:  09/09/21 121 lb 6.4 oz (55.1 kg)  04/14/21 133 lb 6.4 oz (60.5 kg)  02/23/21 127 lb (57.6 kg)   Today their BP is BP: 96/60 She does not workout. She denies chest pain, shortness of breath, dizziness.   She is not on cholesterol medication and denies myalgias. Her cholesterol is not at goal. The cholesterol last  visit was:   Lab Results  Component Value Date   CHOL 207 (H) 09/09/2020   HDL 67 09/09/2020   LDLCALC 124 (H) 09/09/2020   TRIG 70 09/09/2020   CHOLHDL 3.1 09/09/2020   She has not been working on diet and exercise for hx of prediabetes, she is on bASA, she is not on ACE/ARB and denies hyperglycemia, hypoglycemia , polydipsia and polyuria. Last A1C in the office was:  Lab Results  Component Value Date   HGBA1C 5.5 08/01/2019   Last GFR: Lab Results  Component Value Date   GFRNONAA >60 01/28/2021   Patient is not currently on Vitamin D supplement, taking multivitamin, unsure of dose.   Lab Results  Component Value Date   VD25OH 36 09/09/2020     She had anemia this past year with low iron, had normal hemoccult 10/20/2020, was on multvitamin with iron until recently but stopped 2-3 months ago.  Lab Results  Component Value Date   IRON 31 (L) 02/23/2021   TIBC  401 02/23/2021   FERRITIN 6 (L) 02/23/2021     Current Medications:  Current Outpatient Medications on File Prior to Visit  Medication Sig Dispense Refill   aspirin EC 81 MG tablet Take 1 tablet (81 mg total) by mouth daily. (Patient taking differently: Take 81 mg by mouth at bedtime.)     busPIRone (BUSPAR) 10 MG tablet Take 10 mg by mouth as needed.     butalbital-acetaminophen-caffeine (FIORICET) 50-325-40 MG tablet TAKE 1 TO 2 TABLETS EVERY 6 HOURS AS NEEDED ONSET OF HEADACHE. MAX 6 TABS PER EVENT. MAX 30 A MONTH 30 tablet 2   CLONAZEPAM PO Take by mouth as needed.     DimenhyDRINATE (MOTION SICKNESS RELIEF PO) Take 1-2 tablets by mouth at bedtime as needed (motion sickness).     lamoTRIgine (LAMICTAL) 150 MG tablet Take 200 mg at bedtime by mouth.      lurasidone (LATUDA) 20 MG TABS tablet Take 60 mg by mouth at bedtime.     Melatonin 3 MG CAPS Take 6 mg by mouth at bedtime.      montelukast (SINGULAIR) 10 MG tablet Take 1 tablet (10 mg total) by mouth at bedtime. 90 tablet 3   NEXIUM 40 MG capsule TAKE ONE CAPSULE  BY MOUTH EVERY DAY 90 capsule 1   promethazine (PHENERGAN) 25 MG tablet Take 1 tablet (25 mg total) by mouth every 6 (six) hours as needed for nausea or vomiting (can cause fatigue). Max: 4 tablets per day 30 tablet 0   vortioxetine HBr (TRINTELLIX) 20 MG TABS tablet Take 20 mg by mouth daily.     No current facility-administered medications on file prior to visit.   Allergies:  Allergies  Allergen Reactions   Codeine    Medical History:  She has Allergy; Sleep apnea; Migraine; Bipolar disorder (manic depression) ([place]); GERD (gastroesophageal reflux disease); Vitamin D deficiency; Medication management; Labile hypertension; Abnormal glucose; S/P cesarean section; Mixed hyperlipidemia; Stress at home; Caloric malnutrition ([place]); Aortic atherosclerosis ([place]); Iron deficiency anemia; and Family history of Alzheimer's disease on their problem list.  Health Maintenance:   Immunization History  Administered Date(s) Administered   Influenza Inj Mdck Quad With Preservative 07/13/2018   Influenza Split 07/10/2013, 07/04/2014, 06/17/2015   Influenza-Unspecified 08/01/2021   PFIZER(Purple Top)SARS-COV-2 Vaccination 12/07/2019, 12/22/2019, 03/30/2021   PPD Test 01/14/2014, 01/16/2015   Pneumococcal Polysaccharide-23 12/31/2011   Tdap 12/28/2010, 09/13/2016   Preventative care: Last colonoscopy: 03/2012 due 03/2022 Last mammogram: 10/10/2020 [facility], she will call to schedule  Last pap smear/pelvic exam: 2020 with GYN, has upcomin GYN appointment in Jan 2023 DEXA: n/a  Vaccinations: TD or Tdap: 2018  Influenza: 06/2021 Pneumococcal: 2013 Shingles: Due, discussed with patient, check with insruance  Covid 19: 2/2, 2021, pfizer - boosted 03/2021  Names of Other Physician/Practitioners you currently use: 1. [facility] here for primary care 2. Eye Exam, [place], last 2022, glasses, gets botox injections for spasms 3  Dentist, [place], 2022,  goes q53m   Surgical History:  She has a past surgical history that includes Nasal septum surgery (2836); Tonsillectomy (1966); Cesarean section (2009); Colonoscopy (06/2012); Gynecologic cryosurgery; and Cesarean section (N/A, 03/09/2017). Family History:  Herfamily history includes ADD / ADHD in her daughter; Alzheimer's disease (age of onset: 60) in her mother; Arrhythmia in her father; Asperger's syndrome in her daughter; Bipolar disorder in her son; Colon cancer (age of onset: 38) in her paternal uncle; Colon cancer (age of onset: 31) in her paternal uncle; Hearing  loss in her daughter; Intellectual disability in her daughter; Polycystic ovary syndrome (age of onset: 77) in her daughter; Prostate cancer in her father; Rheum arthritis in her paternal grandmother; Strabismus in her daughter; Stroke in her maternal grandfather and maternal grandmother. Social History:  She reports that she has never smoked. She has never used smokeless tobacco. She reports that she does not drink alcohol and does not use drugs.   Review of Systems: Review of Systems  Constitutional:  Positive for malaise/fatigue. Negative for weight loss.  HENT:  Negative for hearing loss and tinnitus.   Eyes:  Negative for blurred vision and double vision.  Respiratory:  Negative for cough, shortness of breath and wheezing.   Cardiovascular:  Negative for chest pain, palpitations, orthopnea, claudication and leg swelling.  Gastrointestinal:  Negative for abdominal pain, blood in stool, constipation, diarrhea, heartburn, melena, nausea and vomiting.  Genitourinary: Negative.   Musculoskeletal:  Negative for joint pain and myalgias.  Skin:  Negative for rash.  Neurological:  Negative for dizziness, tingling, sensory change, weakness and headaches (improved).  Endo/Heme/Allergies:  Negative for polydipsia.  Psychiatric/Behavioral:  Positive for depression. Negative for hallucinations, memory loss, substance abuse and suicidal  ideas. The patient has insomnia.   All other systems reviewed and are negative.  Physical Exam: Estimated body mass index is 20.36 kg/m as calculated from the following:   Height as of this encounter: 5' 4.75" (1.645 m).   Weight as of this encounter: 121 lb 6.4 oz (55.1 kg). BP 96/60    Pulse 76    Temp 97.7 F (36.5 C)    Ht 5' 4.75" (1.645 m)    Wt 121 lb 6.4 oz (55.1 kg)    LMP 03/12/2012    SpO2 99%    BMI 20.36 kg/m  General Appearance: Well nourished, well developed adult female, appears fatigued in no apparent distress.  Eyes: PERRLA, EOMs, conjunctiva no swelling or erythema,  Sinuses: No Frontal/maxillary tenderness  ENT/Mouth: Ext aud canals clear. TMs pearly grey without distention.  Good dentition. No erythema, swelling, or exudate on post pharynx. Tonsils not swollen or erythematous. Hearing normal.  Neck: Supple, thyroid normal. No bruits  Respiratory: Respiratory effort normal, BS equal bilaterally without rales, rhonchi, wheezing or stridor.  Cardio: RRR without murmurs, rubs or gallops. Brisk peripheral pulses without edema.  Chest: symmetric, with normal excursions and percussion.  Breasts: Defer to gyn  Abdomen: Soft, nontender, no guarding, rebound, hernias, masses, or organomegaly.  Lymphatics: Non tender without lymphadenopathy.  Genitourinary: defer to GYN Musculoskeletal: Full ROM all peripheral extremities, 5/5 strength, and normal gait. She does have neck muscular tension and tenderness.  Skin: Warm, dry without rashes, lesions, ecchymosis. Neuro: Cranial nerves intact, reflexes equal bilaterally. Normal muscle tone, no cerebellar symptoms. Sensation intact.  Psych: Awake and oriented X 3, depressed affect, Insight and Judgment appropriate.   EKG: NSR, ICRBBB   Izora Ribas, NP 10:37 AM [facility]

## 2021-09-09 ENCOUNTER — Other Ambulatory Visit: Payer: Self-pay

## 2021-09-09 ENCOUNTER — Ambulatory Visit (INDEPENDENT_AMBULATORY_CARE_PROVIDER_SITE_OTHER): Admitting: Adult Health

## 2021-09-09 ENCOUNTER — Encounter: Payer: Self-pay | Admitting: Adult Health

## 2021-09-09 VITALS — BP 96/60 | HR 76 | Temp 97.7°F | Ht 64.75 in | Wt 121.4 lb

## 2021-09-09 DIAGNOSIS — K219 Gastro-esophageal reflux disease without esophagitis: Secondary | ICD-10-CM

## 2021-09-09 DIAGNOSIS — Z130 Encounter for screening for diseases of the blood and blood-forming organs and certain disorders involving the immune mechanism: Secondary | ICD-10-CM

## 2021-09-09 DIAGNOSIS — Z79899 Other long term (current) drug therapy: Secondary | ICD-10-CM | POA: Diagnosis not present

## 2021-09-09 DIAGNOSIS — F3175 Bipolar disorder, in partial remission, most recent episode depressed: Secondary | ICD-10-CM

## 2021-09-09 DIAGNOSIS — E559 Vitamin D deficiency, unspecified: Secondary | ICD-10-CM

## 2021-09-09 DIAGNOSIS — R7309 Other abnormal glucose: Secondary | ICD-10-CM

## 2021-09-09 DIAGNOSIS — Z13220 Encounter for screening for lipoid disorders: Secondary | ICD-10-CM

## 2021-09-09 DIAGNOSIS — Z0001 Encounter for general adult medical examination with abnormal findings: Secondary | ICD-10-CM

## 2021-09-09 DIAGNOSIS — D509 Iron deficiency anemia, unspecified: Secondary | ICD-10-CM

## 2021-09-09 DIAGNOSIS — Z0000 Encounter for general adult medical examination without abnormal findings: Secondary | ICD-10-CM

## 2021-09-09 DIAGNOSIS — Z136 Encounter for screening for cardiovascular disorders: Secondary | ICD-10-CM

## 2021-09-09 DIAGNOSIS — E538 Deficiency of other specified B group vitamins: Secondary | ICD-10-CM

## 2021-09-09 DIAGNOSIS — I700 Atherosclerosis of aorta: Secondary | ICD-10-CM

## 2021-09-09 DIAGNOSIS — R0989 Other specified symptoms and signs involving the circulatory and respiratory systems: Secondary | ICD-10-CM | POA: Diagnosis not present

## 2021-09-09 DIAGNOSIS — Z6821 Body mass index (BMI) 21.0-21.9, adult: Secondary | ICD-10-CM

## 2021-09-09 DIAGNOSIS — G4730 Sleep apnea, unspecified: Secondary | ICD-10-CM

## 2021-09-09 DIAGNOSIS — E782 Mixed hyperlipidemia: Secondary | ICD-10-CM

## 2021-09-09 DIAGNOSIS — G43809 Other migraine, not intractable, without status migrainosus: Secondary | ICD-10-CM

## 2021-09-09 DIAGNOSIS — F439 Reaction to severe stress, unspecified: Secondary | ICD-10-CM

## 2021-09-09 DIAGNOSIS — Z131 Encounter for screening for diabetes mellitus: Secondary | ICD-10-CM | POA: Diagnosis not present

## 2021-09-09 DIAGNOSIS — Z1389 Encounter for screening for other disorder: Secondary | ICD-10-CM | POA: Diagnosis not present

## 2021-09-09 DIAGNOSIS — E46 Unspecified protein-calorie malnutrition: Secondary | ICD-10-CM

## 2021-09-09 DIAGNOSIS — Z1329 Encounter for screening for other suspected endocrine disorder: Secondary | ICD-10-CM

## 2021-09-09 NOTE — Patient Instructions (Signed)
°  Bonnie Hall , Thank you for taking time to come for your Annual Wellness Visit. I appreciate your ongoing commitment to your health goals. Please review the following plan we discussed and let me know if I can assist you in the future.   These are the goals we discussed:  Goals      Blood Pressure < 130/80     DIET - EAT MORE FRUITS AND VEGETABLES     5+ servings daily      DIET - INCREASE WATER INTAKE     65 fluid ounces         This is a list of the screening recommended for you and due dates:  Health Maintenance  Topic Date Due   Zoster (Shingles) Vaccine (1 of 2) Never done   COVID-19 Vaccine (4 - Booster) 05/25/2021   Pneumococcal Vaccination (2 - PCV) 09/09/2026*   Colon Cancer Screening  04/11/2022   Pap Smear  07/24/2022   Mammogram  10/10/2022   Tetanus Vaccine  09/13/2026   Flu Shot  Completed   Hepatitis C Screening: USPSTF Recommendation to screen - Ages 18-79 yo.  Completed   HIV Screening  Completed   HPV Vaccine  Aged Out  *Topic was postponed. The date shown is not the original due date.    Ask pharmacy about shingrix coverage   Know what a healthy weight is for you (roughly BMI <25) and aim to maintain this  Aim for 7+ servings of fruits and vegetables daily  65-80+ fluid ounces of water or unsweet tea for healthy kidneys  Limit to max 1 drink of alcohol per day; avoid smoking/tobacco  Limit animal fats in diet for cholesterol and heart health - choose grass fed whenever available  Avoid highly processed foods, and foods high in saturated/trans fats  Aim for low stress - take time to unwind and care for your mental health  Aim for 150 min of moderate intensity exercise weekly for heart health, and weights twice weekly for bone health  Aim for 7-9 hours of sleep daily      A great goal to work towards is aiming to get in a serving daily of some of the most nutritionally dense foods - G- BOMBS daily

## 2021-09-10 LAB — COMPLETE METABOLIC PANEL WITH GFR
AG Ratio: 2 (calc) (ref 1.0–2.5)
ALT: 9 U/L (ref 6–29)
AST: 11 U/L (ref 10–35)
Albumin: 4.4 g/dL (ref 3.6–5.1)
Alkaline phosphatase (APISO): 46 U/L (ref 37–153)
BUN: 22 mg/dL (ref 7–25)
CO2: 32 mmol/L (ref 20–32)
Calcium: 10.1 mg/dL (ref 8.6–10.4)
Chloride: 100 mmol/L (ref 98–110)
Creat: 0.82 mg/dL (ref 0.50–1.03)
Globulin: 2.2 g/dL (calc) (ref 1.9–3.7)
Glucose, Bld: 74 mg/dL (ref 65–99)
Potassium: 4.7 mmol/L (ref 3.5–5.3)
Sodium: 139 mmol/L (ref 135–146)
Total Bilirubin: 0.4 mg/dL (ref 0.2–1.2)
Total Protein: 6.6 g/dL (ref 6.1–8.1)
eGFR: 82 mL/min/{1.73_m2} (ref >=60)

## 2021-09-10 LAB — URINALYSIS, ROUTINE W REFLEX MICROSCOPIC
Bilirubin Urine: NEGATIVE
Glucose, UA: NEGATIVE
Hgb urine dipstick: NEGATIVE
Ketones, ur: NEGATIVE
Leukocytes,Ua: NEGATIVE
Nitrite: NEGATIVE
Protein, ur: NEGATIVE
Specific Gravity, Urine: 1.027 (ref 1.001–1.035)
pH: 5.5 (ref 5.0–8.0)

## 2021-09-10 LAB — IRON,TIBC AND FERRITIN PANEL
%SAT: 48 % (calc) — ABNORMAL HIGH (ref 16–45)
Ferritin: 55 ng/mL (ref 16–232)
Iron: 147 ug/dL (ref 45–160)
TIBC: 304 mcg/dL (calc) (ref 250–450)

## 2021-09-10 LAB — TSH: TSH: 0.69 mIU/L (ref 0.40–4.50)

## 2021-09-10 LAB — VITAMIN D 25 HYDROXY (VIT D DEFICIENCY, FRACTURES): Vit D, 25-Hydroxy: 32 ng/mL (ref 30–100)

## 2021-09-10 LAB — HEMOGLOBIN A1C
Hgb A1c MFr Bld: 5.3 % of total Hgb (ref <5.7)
Mean Plasma Glucose: 105 mg/dL
eAG (mmol/L): 5.8 mmol/L

## 2021-09-10 LAB — CBC WITH DIFFERENTIAL/PLATELET
Absolute Monocytes: 342 cells/uL (ref 200–950)
Basophils Absolute: 49 cells/uL (ref 0–200)
Basophils Relative: 0.8 %
Eosinophils Absolute: 67 cells/uL (ref 15–500)
Eosinophils Relative: 1.1 %
HCT: 41.5 % (ref 35.0–45.0)
Hemoglobin: 13.8 g/dL (ref 11.7–15.5)
Lymphs Abs: 939 cells/uL (ref 850–3900)
MCH: 32.7 pg (ref 27.0–33.0)
MCHC: 33.3 g/dL (ref 32.0–36.0)
MCV: 98.3 fL (ref 80.0–100.0)
MPV: 10.2 fL (ref 7.5–12.5)
Monocytes Relative: 5.6 %
Neutro Abs: 4703 cells/uL (ref 1500–7800)
Neutrophils Relative %: 77.1 %
Platelets: 457 10*3/uL — ABNORMAL HIGH (ref 140–400)
RBC: 4.22 10*6/uL (ref 3.80–5.10)
RDW: 11.9 % (ref 11.0–15.0)
Total Lymphocyte: 15.4 %
WBC: 6.1 10*3/uL (ref 3.8–10.8)

## 2021-09-10 LAB — LIPID PANEL
Cholesterol: 218 mg/dL — ABNORMAL HIGH (ref <200)
HDL: 75 mg/dL (ref >=50)
LDL Cholesterol (Calc): 117 mg/dL (calc) — ABNORMAL HIGH
Non-HDL Cholesterol (Calc): 143 mg/dL (calc) — ABNORMAL HIGH (ref <130)
Total CHOL/HDL Ratio: 2.9 (calc) (ref <5.0)
Triglycerides: 147 mg/dL (ref <150)

## 2021-09-10 LAB — VITAMIN B12: Vitamin B-12: 476 pg/mL (ref 200–1100)

## 2021-09-10 LAB — MICROALBUMIN / CREATININE URINE RATIO
Creatinine, Urine: 172 mg/dL (ref 20–275)
Microalb Creat Ratio: 5 mcg/mg creat (ref <30)
Microalb, Ur: 0.9 mg/dL

## 2021-09-10 LAB — MAGNESIUM: Magnesium: 2.3 mg/dL (ref 1.5–2.5)

## 2021-12-01 ENCOUNTER — Other Ambulatory Visit: Payer: Self-pay | Admitting: Adult Health

## 2022-02-19 ENCOUNTER — Telehealth: Payer: Self-pay | Admitting: Adult Health

## 2022-02-19 NOTE — Telephone Encounter (Signed)
Pt is wanting to know if at any point in 2022, Caryl Pina performed any autoimmune blood test or exams or rheumatoid arthritis exams on her related to her weight loss.

## 2022-03-09 ENCOUNTER — Other Ambulatory Visit: Payer: Self-pay | Admitting: Nurse Practitioner

## 2022-03-09 NOTE — Progress Notes (Signed)
6 MONTH FOLLOW UP  Assessment and Plan:   Labile hypertension Well controlled at this visit; intermittently elevated in the past; monitor Monitor blood pressure at home; call if consistently over 130/80 Continue DASH diet.   Reminder to go to the ER if any CP, SOB, nausea, dizziness, severe HA, changes vision/speech, left arm numbness and tingling and jaw pain.  Gastroesophageal reflux disease, esophagitis presence not specified Well managed on current medications Discuss PPI taper next visit  Discussed diet, avoiding triggers and other lifestyle changes -     Magnesium  Other migraine without status migrainosus, not intractable Fioricet works well; reports recently improved Stress management techniques discussed, increase water, good sleep hygiene discussed, increase exercise, and increase veggies.   Bipolar disorder, active, most recent episode depressed ([place])       -      Continue medications, follow up [facility]  Abnormal glucose Recent A1Cs at goal Discussed diet/exercise, weight management  Defer A1C; check CMP  Vitamin D deficiency Advised to add 2000 IU daily for goal around 60  Medication management -     CBC with Differential/Platelet -     CMP/GFR  Cholesterol Currently mild elevations managed by lifestyle modification Continue low cholesterol diet and exercise.  Check lipid panel.  -      Lipid panel  Polyarthralgia She is requesting to repeat some autoimmune testing Checking ESR, CRP, RF, ANA, anti-CCP If negative continue to monitor, NSAIDs, antiinflammatory diet reviewed  Orders Placed This Encounter  Procedures   C-reactive protein   Sedimentation rate   ANA   Anti-Smith antibody   CBC with Differential/Platelet   COMPLETE METABOLIC PANEL WITH GFR   Lipid panel   Cyclic citrul peptide antibody, IgG    Discussed med's effects and SE's. labs and tests as requested with regular follow-up as recommended. Over 30  minutes of exam, counseling, chart review, and complex, high level critical decision making was performed this visit.   Future Appointments  Date Time Provider [place]  09/09/2022 10:00 AM Darrol Jump, NP [facility] None     HPI  61 y.o. female  presents for 6 month follow up; she has Allergy; Sleep apnea; Migraine; Bipolar disorder (manic depression) ([place]); GERD (gastroesophageal reflux disease); Vitamin D deficiency; Medication management; Labile hypertension; Abnormal glucose; S/P cesarean section; Mixed hyperlipidemia; Stress at home; Caloric malnutrition ([place]); Aortic atherosclerosis ([place]) - per CT 01/30/2020; and Family history of Alzheimer's disease on their problem list.   She reports woke up about a month ago with lower back pain, then had generalized joint pain, worst in bil hips and shoulders, has been persistent, very achy in the morning, very stiff but lasts 5-10 min, also fatigue. Denies tick bites, rash. Has been working with chiropractor, some improvement but not resolving. Denies paresthesias or radicular sx.   She is separated since the covid pandemic, however husband still lives in same house (has health issues/PD), 3 children, 38 y/o twins (lots of stress, daughter not doing well with school, son with oppositional disorder), youngest is 60 years old. She is homemaker. Family is in [place] and not a lot of support.   She is followed by Dr. Terisa Starr for Bipolar depression/postpartum depression, followed by [facility]. Also has new therapist lined up. Currently on trintellix, latuda, lamictal, taking daily.    She saw a chiropractor for HA, She taking fioricet PRN, hasn't needed as much in recent months.  She reports reflux is well  controlled with nexium for many years    Has allergies/hx of asthma improved with singulair, has albuterol but hasn't needed in years.   BMI is Body mass index is 22.37 kg/m., she has not been working on diet  and exercise, admits very poor appetite since mom passing, does eat 2 meals/day.  Wt Readings from Last 3 Encounters:  03/10/22 133 lb 6.4 oz (60.5 kg)  09/09/21 121 lb 6.4 oz (55.1 kg)  04/14/21 133 lb 6.4 oz (60.5 kg)   Today their BP is BP: 107/64 She does not workout. She denies chest pain, shortness of breath, dizziness.   She has aortic atherosclerosis per CT 01/30/2020  She is not on cholesterol medication and denies myalgias, reports fasting today. Her cholesterol is not at goal. The cholesterol last visit was:   Lab Results  Component Value Date   CHOL 218 (H) 09/09/2021   HDL 75 09/09/2021   LDLCALC 117 (H) 09/09/2021   TRIG 147 09/09/2021   CHOLHDL 2.9 09/09/2021   She has not been working on diet and exercise for hx of prediabetes, she is on bASA, she is not on ACE/ARB and denies hyperglycemia, hypoglycemia , polydipsia and polyuria. Last A1C in the office was:  Lab Results  Component Value Date   HGBA1C 5.3 09/09/2021   Patient is not currently on Vitamin D supplement, started taking regularly for a few months but has stopped -  Lab Results  Component Value Date   VD25OH 32 09/09/2021        Current Medications:  Current Outpatient Medications on File Prior to Visit  Medication Sig Dispense Refill   aspirin EC 81 MG tablet Take 1 tablet (81 mg total) by mouth daily. (Patient taking differently: Take 81 mg by mouth at bedtime.)     butalbital-acetaminophen-caffeine (FIORICET) 50-325-40 MG tablet TAKE 1 TO 2 TABLETS EVERY 6 HOURS AS NEEDED ONSET OF HEADACHE. MAX 6 TABS PER EVENT. MAX 30 A MONTH 30 tablet 2   CLONAZEPAM PO Take by mouth as needed.     DimenhyDRINATE (MOTION SICKNESS RELIEF PO) Take 1-2 tablets by mouth at bedtime as needed (motion sickness).     lamoTRIgine (LAMICTAL) 150 MG tablet Take 200 mg at bedtime by mouth.      lurasidone (LATUDA) 20 MG TABS tablet Take 60 mg by mouth at bedtime.     Melatonin 3 MG CAPS Take 6 mg by mouth at bedtime.       montelukast (SINGULAIR) 10 MG tablet TAKE ONE TABLET BY MOUTH EVERY DAY AT BEDTIME 90 tablet 3   NEXIUM 40 MG capsule TAKE ONE CAPSULE BY MOUTH EVERY DAY 90 capsule 1   promethazine (PHENERGAN) 25 MG tablet Take 1 tablet (25 mg total) by mouth every 6 (six) hours as needed for nausea or vomiting (can cause fatigue). Max: 4 tablets per day 30 tablet 0   vortioxetine HBr (TRINTELLIX) 20 MG TABS tablet Take 5 mg by mouth daily.     busPIRone (BUSPAR) 10 MG tablet Take 10 mg by mouth as needed. (Patient not taking: Reported on 03/10/2022)     No current facility-administered medications on file prior to visit.   Allergies:  Allergies  Allergen Reactions   Codeine    Medical History:  She has Allergy; Sleep apnea; Migraine; Bipolar disorder (manic depression) ([place]); GERD (gastroesophageal reflux disease); Vitamin D deficiency; Medication management; Labile hypertension; Abnormal glucose; S/P cesarean section; Mixed hyperlipidemia; Stress at home; Caloric malnutrition ([place]); Aortic atherosclerosis ([facility]) - per CT  01/30/2020; and Family history of Alzheimer's disease on their problem list.    Surgical History:  She has a past surgical history that includes Nasal septum surgery (1983); Tonsillectomy (1966); Cesarean section (2009); Colonoscopy (06/2012); Gynecologic cryosurgery; and Cesarean section (N/A, 03/09/2017). Family History:  Herfamily history includes ADD / ADHD in her daughter; Alzheimer's disease (age of onset: 86) in her mother; Arrhythmia in her father; Asperger's syndrome in her daughter; Bipolar disorder in her son; Colon cancer (age of onset: 74) in her paternal uncle; Colon cancer (age of onset: 32) in her paternal uncle; Hearing loss in her daughter; Intellectual disability in her daughter; Polycystic ovary syndrome (age of onset: 52) in her daughter; Prostate cancer in her father; Rheum arthritis in her paternal grandmother; Strabismus in her daughter; Stroke in her maternal grandfather  and maternal grandmother. Social History:  She reports that she has never smoked. She has never used smokeless tobacco. She reports that she does not drink alcohol and does not use drugs.   Review of Systems: Review of Systems  Constitutional:  Positive for malaise/fatigue. Negative for weight loss.  HENT:  Negative for hearing loss and tinnitus.   Eyes:  Negative for blurred vision and double vision.  Respiratory:  Negative for cough, shortness of breath and wheezing.   Cardiovascular:  Negative for chest pain, palpitations, orthopnea, claudication and leg swelling.  Gastrointestinal:  Negative for abdominal pain, blood in stool, constipation, diarrhea, heartburn, melena, nausea and vomiting.  Genitourinary: Negative.   Musculoskeletal:  Positive for joint pain (bil shoulders, hips are worst, but generalized joint aches). Negative for myalgias.  Skin:  Negative for rash.  Neurological:  Negative for dizziness, tingling, sensory change, weakness and headaches (improved).  Endo/Heme/Allergies:  Positive for environmental allergies. Negative for polydipsia.  Psychiatric/Behavioral:  Positive for depression. Negative for hallucinations, memory loss, substance abuse and suicidal ideas. The patient has insomnia.   All other systems reviewed and are negative.   Physical Exam: Estimated body mass index is 22.37 kg/m as calculated from the following:   Height as of 09/09/21: 5' 4.75" (1.645 m).   Weight as of this encounter: 133 lb 6.4 oz (60.5 kg). BP 107/64   Pulse 71   Temp (!) 97.1 F (36.2 C)   Wt 133 lb 6.4 oz (60.5 kg)   LMP 03/12/2012   SpO2 99%   BMI 22.37 kg/m  General Appearance: Well nourished, well developed adult female, appears fatigued in no apparent distress.  Eyes: PERRLA, EOMs, conjunctiva no swelling or erythema,  Sinuses: No Frontal/maxillary tenderness  ENT/Mouth: Ext aud canals clear. TMs pearly grey without distention.  Good dentition. No erythema, swelling, or  exudate on post pharynx. Tonsils not swollen or erythematous. Hearing normal.  Neck: Supple, thyroid normal. No bruits  Respiratory: Respiratory effort normal, BS equal bilaterally without rales, rhonchi, wheezing or stridor.  Cardio: RRR without murmurs, rubs or gallops. Brisk peripheral pulses without edema.  Abdomen: Soft, nontender, no guarding, rebound, hernias, masses, or organomegaly.  Lymphatics: Non tender without lymphadenopathy.  Musculoskeletal: Full ROM all peripheral extremities, 5/5 strength, and normal gait. No effusions, heat, obvious joint deformity. She does have neck muscular tension and tenderness.  Skin: Warm, dry without rashes, lesions, ecchymosis. Neuro: Cranial nerves intact, reflexes equal bilaterally. Normal muscle tone, no cerebellar symptoms. Sensation intact.  Psych: Awake and oriented X 3, depressed affect, Insight and Judgment appropriate.    Izora Ribas, NP 10:22 AM [facility]

## 2022-03-10 ENCOUNTER — Ambulatory Visit (INDEPENDENT_AMBULATORY_CARE_PROVIDER_SITE_OTHER): Admitting: Adult Health

## 2022-03-10 ENCOUNTER — Encounter: Payer: Self-pay | Admitting: Adult Health

## 2022-03-10 VITALS — BP 107/64 | HR 71 | Temp 97.1°F | Wt 133.4 lb

## 2022-03-10 DIAGNOSIS — M2550 Pain in unspecified joint: Secondary | ICD-10-CM

## 2022-03-10 DIAGNOSIS — I700 Atherosclerosis of aorta: Secondary | ICD-10-CM

## 2022-03-10 DIAGNOSIS — R0989 Other specified symptoms and signs involving the circulatory and respiratory systems: Secondary | ICD-10-CM | POA: Diagnosis not present

## 2022-03-10 DIAGNOSIS — E559 Vitamin D deficiency, unspecified: Secondary | ICD-10-CM

## 2022-03-10 DIAGNOSIS — F439 Reaction to severe stress, unspecified: Secondary | ICD-10-CM

## 2022-03-10 DIAGNOSIS — F3175 Bipolar disorder, in partial remission, most recent episode depressed: Secondary | ICD-10-CM

## 2022-03-10 DIAGNOSIS — R7309 Other abnormal glucose: Secondary | ICD-10-CM

## 2022-03-10 DIAGNOSIS — G43809 Other migraine, not intractable, without status migrainosus: Secondary | ICD-10-CM | POA: Diagnosis not present

## 2022-03-10 DIAGNOSIS — E782 Mixed hyperlipidemia: Secondary | ICD-10-CM

## 2022-03-10 DIAGNOSIS — Z79899 Other long term (current) drug therapy: Secondary | ICD-10-CM

## 2022-03-10 DIAGNOSIS — E46 Unspecified protein-calorie malnutrition: Secondary | ICD-10-CM

## 2022-03-10 NOTE — Patient Instructions (Addendum)
   Suggest adding 2000 IU vitamin D supplement daily -   B12 - suggest sublingual 250-500 mcg daily or so Or can do 1000 mcg a few days a day    Try antiinflammatory diet - low sugar, flour, processed food Plenty of fresh fruits, veggies, whole grains, beans, lean proteins, healthy fats (fatty fish, avocado, nuts/seeds, olive oil), etc  Inflammatory diet can make joint pains worse  Can try ibuprofen/aleve for joint pain, but if worse/not improving please follow up, consider xrays of spine, hips, shoulders and/or ortho evaluation.

## 2022-03-13 LAB — CBC WITH DIFFERENTIAL/PLATELET
Absolute Monocytes: 343 cells/uL (ref 200–950)
Basophils Absolute: 52 cells/uL (ref 0–200)
Basophils Relative: 1.1 %
Eosinophils Absolute: 89 cells/uL (ref 15–500)
Eosinophils Relative: 1.9 %
HCT: 39.2 % (ref 35.0–45.0)
Hemoglobin: 13.2 g/dL (ref 11.7–15.5)
Lymphs Abs: 1199 cells/uL (ref 850–3900)
MCH: 32.4 pg (ref 27.0–33.0)
MCHC: 33.7 g/dL (ref 32.0–36.0)
MCV: 96.3 fL (ref 80.0–100.0)
MPV: 10.1 fL (ref 7.5–12.5)
Monocytes Relative: 7.3 %
Neutro Abs: 3017 cells/uL (ref 1500–7800)
Neutrophils Relative %: 64.2 %
Platelets: 396 10*3/uL (ref 140–400)
RBC: 4.07 10*6/uL (ref 3.80–5.10)
RDW: 12.2 % (ref 11.0–15.0)
Total Lymphocyte: 25.5 %
WBC: 4.7 10*3/uL (ref 3.8–10.8)

## 2022-03-13 LAB — COMPLETE METABOLIC PANEL WITH GFR
AG Ratio: 2 (calc) (ref 1.0–2.5)
ALT: 11 U/L (ref 6–29)
AST: 13 U/L (ref 10–35)
Albumin: 4.3 g/dL (ref 3.6–5.1)
Alkaline phosphatase (APISO): 49 U/L (ref 37–153)
BUN: 14 mg/dL (ref 7–25)
CO2: 28 mmol/L (ref 20–32)
Calcium: 9.6 mg/dL (ref 8.6–10.4)
Chloride: 104 mmol/L (ref 98–110)
Creat: 0.78 mg/dL (ref 0.50–1.05)
Globulin: 2.2 g/dL (calc) (ref 1.9–3.7)
Glucose, Bld: 91 mg/dL (ref 65–99)
Potassium: 4.6 mmol/L (ref 3.5–5.3)
Sodium: 137 mmol/L (ref 135–146)
Total Bilirubin: 0.5 mg/dL (ref 0.2–1.2)
Total Protein: 6.5 g/dL (ref 6.1–8.1)
eGFR: 87 mL/min/{1.73_m2} (ref >=60)

## 2022-03-13 LAB — ANA: Anti Nuclear Antibody (ANA): POSITIVE — AB

## 2022-03-13 LAB — C-REACTIVE PROTEIN: CRP: <0.2 mg/L (ref <8.0)

## 2022-03-13 LAB — LIPID PANEL
Cholesterol: 205 mg/dL — ABNORMAL HIGH (ref <200)
HDL: 66 mg/dL (ref >=50)
LDL Cholesterol (Calc): 122 mg/dL (calc) — ABNORMAL HIGH
Non-HDL Cholesterol (Calc): 139 mg/dL (calc) — ABNORMAL HIGH (ref <130)
Total CHOL/HDL Ratio: 3.1 (calc) (ref <5.0)
Triglycerides: 80 mg/dL (ref <150)

## 2022-03-13 LAB — ANTI-NUCLEAR AB-TITER (ANA TITER): ANA Titer 1: 1:80 {titer} — ABNORMAL HIGH

## 2022-03-13 LAB — ANTI-SMITH ANTIBODY: ENA SM Ab Ser-aCnc: <1 AI

## 2022-03-13 LAB — CYCLIC CITRUL PEPTIDE ANTIBODY, IGG: Cyclic Citrullin Peptide Ab: <16 UNITS

## 2022-03-13 LAB — SEDIMENTATION RATE: Sed Rate: 2 mm/h (ref 0–30)

## 2022-03-29 ENCOUNTER — Encounter: Payer: Self-pay | Admitting: Adult Health

## 2022-03-29 DIAGNOSIS — M2550 Pain in unspecified joint: Secondary | ICD-10-CM

## 2022-04-30 ENCOUNTER — Encounter: Payer: Self-pay | Admitting: Gastroenterology

## 2022-06-18 ENCOUNTER — Other Ambulatory Visit: Payer: Self-pay | Admitting: Nurse Practitioner

## 2022-07-21 ENCOUNTER — Encounter: Payer: Self-pay | Admitting: Gastroenterology

## 2022-08-09 ENCOUNTER — Ambulatory Visit (AMBULATORY_SURGERY_CENTER): Payer: Self-pay

## 2022-08-09 VITALS — Ht 66.0 in | Wt 132.0 lb

## 2022-08-09 DIAGNOSIS — Z1211 Encounter for screening for malignant neoplasm of colon: Secondary | ICD-10-CM

## 2022-08-09 MED ORDER — NA SULFATE-K SULFATE-MG SULF 17.5-3.13-1.6 GM/177ML PO SOLN: 1.0000 | Freq: Once | ORAL | 0 refills | Status: AC

## 2022-08-09 NOTE — Progress Notes (Signed)
No egg or soy allergy known to patient;  No issues known to pt with past sedation with any surgeries or procedures; Patient denies ever being told they had issues or difficulty with intubation;  No FH of Malignant Hyperthermia; Pt is not on diet pills; Pt is not on home 02;  Pt is not on blood thinners;  Pt denies issues with constipation;  No A fib or A flutter; Have any cardiac testing pending--NO Pt instructed to use Singlecare.com or GoodRx for a price reduction on prep;   Insurance verified during [place] appt=[facility]  Patient's chart reviewed by Osvaldo Angst CNRA prior to previsit and patient appropriate for the [place].  Previsit completed and red dot placed by patient's name on their procedure day (on provider's schedule).     GoodRx coupon given to patient during [facility] appt;

## 2022-09-08 ENCOUNTER — Ambulatory Visit (AMBULATORY_SURGERY_CENTER): Admitting: Internal Medicine

## 2022-09-08 ENCOUNTER — Encounter: Payer: Self-pay | Admitting: Gastroenterology

## 2022-09-08 VITALS — BP 135/72 | HR 64 | Temp 98.9°F | Resp 16 | Ht 66.0 in | Wt 132.0 lb

## 2022-09-08 DIAGNOSIS — Z1211 Encounter for screening for malignant neoplasm of colon: Secondary | ICD-10-CM | POA: Diagnosis present

## 2022-09-08 DIAGNOSIS — K6389 Other specified diseases of intestine: Secondary | ICD-10-CM

## 2022-09-08 DIAGNOSIS — D125 Benign neoplasm of sigmoid colon: Secondary | ICD-10-CM

## 2022-09-08 DIAGNOSIS — D124 Benign neoplasm of descending colon: Secondary | ICD-10-CM | POA: Diagnosis not present

## 2022-09-08 MED ORDER — SODIUM CHLORIDE 0.9 % IV SOLN: 500.0000 mL | Freq: Once | INTRAVENOUS | Status: DC

## 2022-09-08 NOTE — Op Note (Signed)
[facility] Patient Name: Bonnie Hall Procedure Date: 09/08/2022 4:39 PM MRN: [id number] Endoscopist: Jerene Bears , MD, [id number] Age: 61 Referring MD:  Date of Birth: 1961/07/20 Gender: Female Account #: [id number] Procedure:                Colonoscopy Indications:              Screening for colorectal malignant neoplasm, Last                            colonoscopy 10 years ago Medicines:                Monitored Anesthesia Care Procedure:                Pre-Anesthesia Assessment:                           - Prior to the procedure, a History and Physical                            was performed, and patient medications and                            allergies were reviewed. The patient's tolerance of                            previous anesthesia was also reviewed. The risks                            and benefits of the procedure and the sedation                            options and risks were discussed with the patient.                            All questions were answered, and informed consent                            was obtained. Prior Anticoagulants: The patient has                            taken no anticoagulant or antiplatelet agents. ASA                            Grade Assessment: II - A patient with mild systemic                            disease. After reviewing the risks and benefits,                            the patient was deemed in satisfactory condition to                            undergo the procedure.  After obtaining informed consent, the colonoscope                            was passed under direct vision. Throughout the                            procedure, the patient's blood pressure, pulse, and                            oxygen saturations were monitored continuously. The                            Olympus PCF-H190DL ([id number]#[id number]) Colonoscope was                            introduced through the anus and  advanced to the                            cecum, identified by appendiceal orifice and                            ileocecal valve. The colonoscopy was performed                            without difficulty. The patient tolerated the                            procedure well. The quality of the bowel                            preparation was good (irrigation and lavage                            required in the right colon). The ileocecal valve,                            appendiceal orifice, and rectum were photographed. Scope In: 4:45:06 PM Scope Out: 5:00:22 PM Scope Withdrawal Time: 0 hours 13 minutes 11 seconds  Total Procedure Duration: 0 hours 15 minutes 16 seconds  Findings:                 The digital rectal exam was normal.                           A 5 mm polyp was found in the descending colon. The                            polyp was sessile. The polyp was removed with a                            cold snare. Resection and retrieval were complete.                           A 7 mm polyp was found in  the distal sigmoid colon.                            The polyp was sessile. The polyp was removed with a                            cold snare. Resection and retrieval were complete.                           Internal hemorrhoids were found during                            retroflexion. The hemorrhoids were small. Complications:            No immediate complications. Estimated Blood Loss:     Estimated blood loss was minimal. Impression:               - One 5 mm polyp in the descending colon, removed                            with a cold snare. Resected and retrieved.                           - One 7 mm polyp in the distal sigmoid colon,                            removed with a cold snare. Resected and retrieved.                           - Small internal hemorrhoids. Recommendation:           - Patient has a contact number available for                             emergencies. The signs and symptoms of potential                            delayed complications were discussed with the                            patient. Return to normal activities tomorrow.                            Written discharge instructions were provided to the                            patient.                           - Resume previous diet.                           - Continue present medications.                           - Await pathology results.                           -  Repeat colonoscopy is recommended. The                            colonoscopy date will be determined after pathology                            results from today's exam become available for                            review. Jerene Bears, MD 09/08/2022 5:02:43 PM This report has been signed electronically.

## 2022-09-08 NOTE — Progress Notes (Signed)
Patient received in recovery post colonoscopy, upon assessment, she appears to have some mild right-sided facial drooping and slurred speech, CRNA called to assess patient, patient's family states the slurred speech sounds like her usual speech (appears unchanged), MD aware and has also assessed patient. 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

## 2022-09-08 NOTE — Patient Instructions (Addendum)
- Patient has a contact number available for emergencies. The signs and symptoms of potential delayed complications were discussed with the patient. Return to normal activities tomorrow. Written discharge instructions were provided to the patient. - Resume previous diet. - Continue present medications - Await pathology results. - Repeat colonoscopy is recommended. The colonoscopy date will be determined after pathology results from today's exam become available for review.  YOU HAD AN ENDOSCOPIC PROCEDURE TODAY AT [facility]:   Refer to the procedure report that was given to you for any specific questions about what was found during the examination.  If the procedure report does not answer your questions, please call your gastroenterologist to clarify.  If you requested that your care partner not be given the details of your procedure findings, then the procedure report has been included in a sealed envelope for you to review at your convenience later.  YOU SHOULD EXPECT: Some feelings of bloating in the abdomen. Passage of more gas than usual.  Walking can help get rid of the air that was put into your GI tract during the procedure and reduce the bloating. If you had a lower endoscopy (such as a colonoscopy or flexible sigmoidoscopy) you may notice spotting of blood in your stool or on the toilet paper. If you underwent a bowel prep for your procedure, you may not have a normal bowel movement for a few days.  Please Note:  You might notice some irritation and congestion in your nose or some drainage.  This is from the oxygen used during your procedure.  There is no need for concern and it should clear up in a day or so.  SYMPTOMS TO REPORT IMMEDIATELY:  Following lower endoscopy (colonoscopy or flexible sigmoidoscopy):  Excessive amounts of blood in the stool  Significant tenderness or worsening of abdominal pains  Swelling of the abdomen that is new, acute  Fever of 100F or  higher   For urgent or emergent issues, a gastroenterologist can be reached at any hour by calling [phone]. Do not use MyChart messaging for urgent concerns.    DIET:  We do recommend a small meal at first, but then you may proceed to your regular diet.  Drink plenty of fluids but you should avoid alcoholic beverages for 24 hours.  MEDICATIONS: Continue present medications.  Please see handouts given to you by your recovery nurse: polyps, hemorrhoids.  Thank you for allowing [place] to provide for your healthcare needs today. ACTIVITY:  You should plan to take it easy for the rest of today and you should NOT DRIVE or use heavy machinery until tomorrow (because of the sedation medicines used during the test).    FOLLOW UP: Our staff will call the number listed on your records the next business day following your procedure.  We will call around 7:15- 8:00 am to check on you and address any questions or concerns that you may have regarding the information given to you following your procedure. If we do not reach you, we will leave a message.     If any biopsies were taken you will be contacted by phone or by letter within the next 1-3 weeks.  Please call us at [phone] if you have not heard about the biopsies in 3 weeks.    SIGNATURES/CONFIDENTIALITY: You and/or your care partner have signed paperwork which will be entered into your electronic medical record.  These signatures attest to the fact that that the information above on your  After Visit Summary has been reviewed and is understood.  Full responsibility of the confidentiality of this discharge information lies with you and/or your care-partner.

## 2022-09-08 NOTE — Progress Notes (Signed)
Report given to PACU, vss 

## 2022-09-08 NOTE — Progress Notes (Signed)
GASTROENTEROLOGY PROCEDURE H&P NOTE   Primary Care Physician: Unk Pinto, MD    Reason for Procedure:  Colon cancer screening  Plan:    Colonoscopy  Patient is appropriate for endoscopic procedure(s) in the ambulatory ([place]) setting.  The nature of the procedure, as well as the risks, benefits, and alternatives were carefully and thoroughly reviewed with the patient. Ample time for discussion and questions allowed. The patient understood, was satisfied, and agreed to proceed.     HPI: Bonnie Hall is a 61 y.o. female who presents for screening colonoscopy.  Medical history as below.  Tolerated the prep.  No recent chest pain or shortness of breath.  No abdominal pain today.  Patient initially scheduled today with Dr. Candis Schatz but due to delays I was asked to perform the colonoscopy.  Discussed with patient who is agreeable.  Past Medical History:  Diagnosis Date   Allergy    Hay fever/exercise induced asthma   AMA (advanced maternal age) multigravida 35+    Anxiety    on meds   Arthritis    generalized   Asthma    Blood transfusion 1990's   Depression    bipolar=on meds   GERD (gastroesophageal reflux disease)    on meds   Hyperlipidemia    on meds   Iron deficiency anemia 1990's   negative work up as to etiology; required iron infusion, blood transfusion.  Saw an hematologist  at [facility].    Left-sided Bell's palsy 02/03/2021   Migraine    Newborn product of in vitro fertilization (IVF) pregnancy    Pregnancy induced hypertension    on meds   Preterm labor    Sleep apnea    no CPAP   Thrombocytosis    Vaginal Pap smear, abnormal     Past Surgical History:  Procedure Laterality Date   CESAREAN SECTION  2009   CESAREAN SECTION N/A 03/09/2017   Procedure: REPEAT CESAREAN SECTION;  Surgeon: Dian Queen, MD;  Location: [facility];  Service: Obstetrics;  Laterality: N/A;   COLONOSCOPY  06/2012   Dr.Kaplan-MAC-movi(exc)-normal-10 yr  recall   [place]    Prior to Admission medications   Medication Sig Start Date End Date Taking? Authorizing Provider  ALPRAZolam Duanne Moron) 1 MG tablet Take 1 mg by mouth at bedtime as needed.   Yes [provider]  aspirin EC 81 MG tablet Take 1 tablet (81 mg total) by mouth daily. Patient taking differently: Take 81 mg by mouth at bedtime. 01/16/15  Yes Unk Pinto, MD  busPIRone (BUSPAR) 10 MG tablet Take 10 mg by mouth daily.   Yes [provider]  CLONAZEPAM PO Take 2 mg by mouth daily at 6 (six) AM.   Yes [provider]  esomeprazole (NEXIUM) 40 MG capsule Take 40 mg by mouth daily at 6 (six) AM. 07/20/17  Yes [provider]  lamoTRIgine (LAMICTAL) 200 MG tablet Take 200 mg by mouth at bedtime.   Yes [provider]  Lurasidone HCl 60 MG TABS Take 60 mg by mouth at bedtime.   Yes [provider]  montelukast (SINGULAIR) 10 MG tablet TAKE ONE TABLET BY MOUTH EVERY DAY AT BEDTIME 12/01/21  Yes Alycia Rossetti, NP  traZODone (DESYREL) 100 MG tablet Take 1.5 tablets by mouth at bedtime. 06/16/22 10/14/22 Yes [provider]  vortioxetine HBr (TRINTELLIX) 20 MG TABS tablet Take 20 mg by mouth  daily.   Yes [provider]  butalbital-acetaminophen-caffeine (FIORICET) 50-325-40 MG tablet TAKE 1 TO 2 TABLETS EVERY 6 HOURS AS NEEDED ONSET OF HEADACHE. MAX 6 TABS PER EVENT. MAX 30 A MONTH 03/04/21   Liane Comber, NP  DimenhyDRINATE (MOTION SICKNESS RELIEF PO) Take 1-2 tablets by mouth at bedtime as needed (motion sickness).    [provider]  NEXIUM 40 MG capsule TAKE ONE CAPSULE BY MOUTH EVERY DAY 06/18/22   Alycia Rossetti, NP  promethazine (PHENERGAN) 25 MG tablet Take 1 tablet (25 mg total) by mouth every 6 (six) hours as needed for nausea or vomiting (can cause fatigue). Max: 4 tablets per day 01/15/21   Liane Comber, NP  SODIUM FLUORIDE  5000 PPM 1.1 % PSTE Take 1 Application by mouth in the morning and at bedtime. 06/22/22   [provider]    Current Outpatient Medications  Medication Sig Dispense Refill   ALPRAZolam (XANAX) 1 MG tablet Take 1 mg by mouth at bedtime as needed.     aspirin EC 81 MG tablet Take 1 tablet (81 mg total) by mouth daily. (Patient taking differently: Take 81 mg by mouth at bedtime.)     busPIRone (BUSPAR) 10 MG tablet Take 10 mg by mouth daily.     CLONAZEPAM PO Take 2 mg by mouth daily at 6 (six) AM.     esomeprazole (NEXIUM) 40 MG capsule Take 40 mg by mouth daily at 6 (six) AM.     lamoTRIgine (LAMICTAL) 200 MG tablet Take 200 mg by mouth at bedtime.     Lurasidone HCl 60 MG TABS Take 60 mg by mouth at bedtime.     montelukast (SINGULAIR) 10 MG tablet TAKE ONE TABLET BY MOUTH EVERY DAY AT BEDTIME 90 tablet 3   traZODone (DESYREL) 100 MG tablet Take 1.5 tablets by mouth at bedtime.     vortioxetine HBr (TRINTELLIX) 20 MG TABS tablet Take 20 mg by mouth daily.     butalbital-acetaminophen-caffeine (FIORICET) 50-325-40 MG tablet TAKE 1 TO 2 TABLETS EVERY 6 HOURS AS NEEDED ONSET OF HEADACHE. MAX 6 TABS PER EVENT. MAX 30 A MONTH 30 tablet 2   DimenhyDRINATE (MOTION SICKNESS RELIEF PO) Take 1-2 tablets by mouth at bedtime as needed (motion sickness).     NEXIUM 40 MG capsule TAKE ONE CAPSULE BY MOUTH EVERY DAY 90 capsule 1   promethazine (PHENERGAN) 25 MG tablet Take 1 tablet (25 mg total) by mouth every 6 (six) hours as needed for nausea or vomiting (can cause fatigue). Max: 4 tablets per day 30 tablet 0   SODIUM FLUORIDE 5000 PPM 1.1 % PSTE Take 1 Application by mouth in the morning and at bedtime.     Current Facility-Administered Medications  Medication Dose Route Frequency Provider Last Rate Last Admin   0.9 %  sodium chloride infusion  500 mL Intravenous Once Daryel November, MD        Allergies as of 09/08/2022   (No Known Allergies)    Family History  Problem Relation Age  of Onset   Alzheimer's disease Mother 58   Prostate cancer Father    Arrhythmia Father    Colon cancer Paternal Uncle 65   Colon cancer Paternal Uncle 11   Stroke Maternal Grandmother    Stroke Maternal Grandfather    Rheum arthritis Paternal Grandmother    Hearing loss Daughter    Strabismus Daughter    Asperger's syndrome Daughter    ADD / ADHD Daughter  Intellectual disability Daughter    Polycystic ovary syndrome Daughter 45   Bipolar disorder Son    Colon polyps Neg Hx    Esophageal cancer Neg Hx    Stomach cancer Neg Hx    Rectal cancer Neg Hx     Social History   Socioeconomic History   Marital status: Married    Spouse name: Not on file   Number of children: 2   Years of education: Not on file   Highest education level: Not on file  Occupational History    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  Tobacco Use   Smoking status: Never   Smokeless tobacco: Never  Vaping Use   Vaping Use: Never used  Substance and Sexual Activity   Alcohol use: No   Drug use: No   Sexual activity: Not Currently    Partners: Male    Birth control/protection: Post-menopausal  Other Topics Concern   Not on file  Social History Narrative   Right handed   Drinks caffeine   Two story hom   Social Determinants of Health   Financial Resource Strain: Not on file  Food Insecurity: Not on file  Transportation Needs: Not on file  Physical Activity: Inactive (08/01/2019)   Exercise Vital Sign    Days of Exercise per Week: 0 days    Minutes of Exercise per Session: 0 min  Stress: Stress Concern Present (08/01/2019)   [place]    Feeling of Stress : Very much  Social Connections: Not on file  Intimate Partner Violence: Not on file    Physical Exam: Vital signs in last 24 hours: _0  132/85   Pulse 81   Temp 98.9 F (37.2 C) (Temporal)   Ht _1  (1.676 m)   Wt 132 lb (59.9 kg)   LMP 03/12/2012   SpO2 95%   BMI 21.31 kg/m   GEN: NAD EYE: Sclerae anicteric ENT: MMM CV: Non-tachycardic Pulm: CTA b/l GI: Soft, NT/ND NEURO:  Alert & Oriented x 3   Zenovia Jarred, MD [facility]  09/08/2022 4:31 PM

## 2022-09-08 NOTE — Progress Notes (Signed)
VS completed by [facility].   Pt's states no medical or surgical changes since previsit or office visit.

## 2022-09-09 ENCOUNTER — Telehealth: Payer: Self-pay

## 2022-09-09 ENCOUNTER — Other Ambulatory Visit: Payer: Self-pay

## 2022-09-09 ENCOUNTER — Encounter: Admitting: Nurse Practitioner

## 2022-09-09 ENCOUNTER — Encounter: Payer: Self-pay | Admitting: Nurse Practitioner

## 2022-09-09 ENCOUNTER — Ambulatory Visit (INDEPENDENT_AMBULATORY_CARE_PROVIDER_SITE_OTHER): Admitting: Nurse Practitioner

## 2022-09-09 VITALS — BP 118/72 | HR 60 | Temp 98.0°F | Ht 66.0 in | Wt 129.2 lb

## 2022-09-09 DIAGNOSIS — G4483 Primary cough headache: Secondary | ICD-10-CM

## 2022-09-09 DIAGNOSIS — J042 Acute laryngotracheitis: Secondary | ICD-10-CM

## 2022-09-09 DIAGNOSIS — R29810 Facial weakness: Secondary | ICD-10-CM | POA: Diagnosis not present

## 2022-09-09 LAB — POC COVID19 BINAXNOW: SARS Coronavirus 2 Ag: NEGATIVE

## 2022-09-09 LAB — POC INFLUENZA A&B (BINAX/QUICKVUE)
Influenza A, POC: NEGATIVE
Influenza B, POC: NEGATIVE

## 2022-09-09 MED ORDER — BENZONATATE 100 MG PO CAPS: 100.0000 mg | ORAL_CAPSULE | Freq: Four times a day (QID) | ORAL | 1 refills | Status: DC | PRN

## 2022-09-09 MED ORDER — AZITHROMYCIN 250 MG PO TABS: ORAL_TABLET | ORAL | 1 refills | Status: DC

## 2022-09-09 MED ORDER — DEXAMETHASONE 4 MG PO TABS: ORAL_TABLET | ORAL | 0 refills | Status: DC

## 2022-09-09 MED ORDER — PROMETHAZINE-DM 6.25-15 MG/5ML PO SYRP: 5.0000 mL | ORAL_SOLUTION | Freq: Four times a day (QID) | ORAL | 1 refills | Status: DC | PRN

## 2022-09-09 NOTE — Patient Instructions (Signed)
Laryngitis  Laryngitis is inflammation of the vocal cords that causes symptoms such as hoarseness or loss of voice. The vocal cords are two bands of muscles in your throat. When you speak, these cords come together and vibrate. The vibrations come out through your mouth as sound. When your vocal cords are inflamed, your voice sounds different. Laryngitis can be temporary (acute) or long-term (chronic). Most cases of acute laryngitis improve with time. Chronic laryngitis is laryngitis that lasts for more than 3 weeks. What are the causes? Acute laryngitis may be caused by: A viral infection. Lots of talking, yelling, or singing. This is also called vocal strain. A bacterial infection. Chronic laryngitis may be caused by: Vocal strain or an injury to the vocal cords. Acid reflux (gastroesophageal reflux disease, or GERD). Allergies, a sinus infection, or postnasal drip. Smoking. Excessive alcohol use. Breathing in chemicals or dust. Growths on the vocal cords. What increases the risk? The following factors may make you more likely to develop this condition: Smoking. Alcohol abuse. Having allergies. Chronic irritants in the workplace, such as toxic fumes. What are the signs or symptoms? Symptoms of this condition may include: Low, hoarse voice. Loss of voice. Dry cough. Sore or dry throat. Stuffy or congested nose. How is this diagnosed? This condition may be diagnosed based on: Your symptoms and a physical exam. Throat culture. Blood test. A procedure in which your health care provider looks at your vocal cords with a mirror or viewing tube (laryngoscopy). How is this treated? Treatment for laryngitis depends on what is causing it. Usually, treatment involves resting your voice and using medicines to soothe your throat. If your laryngitis is caused by a bacterial infection, you may need to take antibiotic medicine. If your laryngitis is caused by a growth, you may need to have a  procedure to remove it. Follow these instructions at home: Medicines Take over-the-counter and prescription medicines only as told by your health care provider. If you were prescribed an antibiotic medicine, take it as told by your health care provider. Do not stop taking the antibiotic even if you start to feel better. Use throat lozenges or sprays to soothe your throat as told by your health care provider. General instructions  Talk as little as possible. To do this: Write instead of talking. Do this until your voice is back to normal. Avoid whispering, which can cause vocal strain. Gargle with a mixture of salt and water 3-4 times a day or as needed. To make salt water, completely dissolve -1 tsp (3-6 g) of salt in 1 cup (237 mL) of warm water. Drink enough fluid to keep your urine pale yellow. Breathe in moist air. Use a humidifier if you live in a dry climate. Do not use any products that contain nicotine or tobacco. These products include cigarettes, chewing tobacco, and vaping devices, such as e-cigarettes. If you need help quitting, ask your health care provider. Contact a health care provider if: You have a fever. You have increasing pain. Your symptoms do not get better in 2 weeks. Get help right away if: You cough up blood. You have difficulty swallowing. You have trouble breathing. Summary Laryngitis is inflammation of the vocal cords that causes symptoms such as hoarseness or loss of voice. Laryngitis can be temporary or long-term. Treatment for laryngitis depends on the cause. It often involves resting your voice and using medicine to soothe your throat. Get help right away if you have difficulty swallowing or breathing or if you cough  up blood. This information is not intended to replace advice given to you by your health care provider. Make sure you discuss any questions you have with your health care provider. Document Revised: 11/17/2020 Document Reviewed:  11/17/2020 Elsevier Patient Education  [place].

## 2022-09-09 NOTE — Telephone Encounter (Signed)
  Follow up Call-     09/08/2022    3:16 PM  Call back number  Post procedure Call Back phone  # [phone]  Permission to leave phone message Yes    Post op call attempted, no answer, left WM.

## 2022-09-09 NOTE — Progress Notes (Signed)
Assessment and Plan:  Bonnie Hall was seen today for cough.  Diagnoses and all orders for this visit:  Cough headache -     POC Influenza A&B (Binax test)- negative -     POC COVID-19- negative  Acute laryngitis and tracheitis Push fluids, Mucinex as needed, If no improvement in next 5 days notify the office -     dexamethasone (DECADRON) 4 MG tablet; Take 3 tabs for 3 days, 2 tabs for 3 days 1 tab for 5 days. Take with food. -     azithromycin (ZITHROMAX) 250 MG tablet; Take 2 tablets (500 mg) on  Day 1,  followed by 1 tablet (250 mg) once daily on Days 2 through 5. -     promethazine-dextromethorphan (PROMETHAZINE-DM) 6.25-15 MG/5ML syrup; Take 5 mLs by mouth 4 (four) times daily as needed for cough. -     benzonatate (TESSALON PERLES) 100 MG capsule; Take 1 capsule (100 mg total) by mouth every 6 (six) hours as needed for cough.  Drooping of mouth - Very mild on left side Does not appear consistent with Bell's Palsy Advised to contact psychiatrist and discuss possible tardive dyskinesia from medications     Further disposition pending results of labs. Discussed med's effects and SE's.   Over 30 minutes of exam, counseling, chart review, and critical decision making was performed.   Future Appointments  Date Time Provider [place]  09/09/2022 10:00 AM Alycia Rossetti, NP [facility] None  09/28/2022  9:00 AM Darrol Jump, NP [facility] None  11/09/2022 10:20 AM Bo Merino, MD [facility] None    ------------------------------------------------------------------------------------------------------------------   HPI BP 118/72   Pulse 60   Temp 98 F (36.7 C)   Ht '5\' 6"'$  (1.676 m)   Wt 129 lb 3.2 oz (58.6 kg)   LMP 03/12/2012   SpO2 96%   BMI 20.85 kg/m   61 y.o.female presents for dry cough, sore throat and mild laryngitis.  Denies muscle  aches, congestion, headaches, fevers, nausea, vomiting and diarrhea. Symptoms began 2 days ago  Had colonoscopy  yesterday and gastroenterologist commented on mild lowering of left side of mouth.  He suggested being checked for Bell's Palsy which she had in the past  Past Medical History:  Diagnosis Date   Allergy    Hay fever/exercise induced asthma   AMA (advanced maternal age) multigravida 35+    Anxiety    on meds   Arthritis    generalized   Asthma    Blood transfusion 1990's   Depression    bipolar=on meds   GERD (gastroesophageal reflux disease)    on meds   Hyperlipidemia    on meds   Iron deficiency anemia 1990's   negative work up as to etiology; required iron infusion, blood transfusion.  Saw an hematologist  at [facility].    Left-sided Bell's palsy 02/03/2021   Migraine    Newborn product of in vitro fertilization (IVF) pregnancy    Pregnancy induced hypertension    on meds   Preterm labor    Sleep apnea    no CPAP   Thrombocytosis    Vaginal Pap smear, abnormal      No Known Allergies  Current Outpatient Medications on File Prior to Visit  Medication Sig   ALPRAZolam (XANAX) 1 MG tablet Take 1 mg by mouth at bedtime as needed.   aspirin EC 81 MG tablet Take 1 tablet (81 mg total) by mouth daily. (Patient taking differently: Take 81 mg by mouth at bedtime.)  busPIRone (BUSPAR) 10 MG tablet Take 10 mg by mouth daily.   butalbital-acetaminophen-caffeine (FIORICET) 50-325-40 MG tablet TAKE 1 TO 2 TABLETS EVERY 6 HOURS AS NEEDED ONSET OF HEADACHE. MAX 6 TABS PER EVENT. MAX 30 A MONTH   CLONAZEPAM PO Take 2 mg by mouth daily at 6 (six) AM.   DimenhyDRINATE (MOTION SICKNESS RELIEF PO) Take 1-2 tablets by mouth at bedtime as needed (motion sickness).   esomeprazole (NEXIUM) 40 MG capsule Take 40 mg by mouth daily at 6 (six) AM.   lamoTRIgine (LAMICTAL) 200 MG tablet Take 200 mg by mouth at bedtime.   Lurasidone HCl 60 MG TABS Take 60 mg by mouth at bedtime.   montelukast (SINGULAIR) 10 MG tablet TAKE ONE TABLET BY MOUTH EVERY DAY AT BEDTIME   NEXIUM 40 MG capsule TAKE ONE CAPSULE  BY MOUTH EVERY DAY   promethazine (PHENERGAN) 25 MG tablet Take 1 tablet (25 mg total) by mouth every 6 (six) hours as needed for nausea or vomiting (can cause fatigue). Max: 4 tablets per day   traZODone (DESYREL) 100 MG tablet Take 1.5 tablets by mouth at bedtime.   vortioxetine HBr (TRINTELLIX) 20 MG TABS tablet Take 20 mg by mouth daily.   SODIUM FLUORIDE 5000 PPM 1.1 % PSTE Take 1 Application by mouth in the morning and at bedtime. (Patient not taking: Reported on 09/09/2022)   Current Facility-Administered Medications on File Prior to Visit  Medication   0.9 %  sodium chloride infusion    ROS: all negative except above.   Physical Exam:  BP 118/72   Pulse 60   Temp 98 F (36.7 C)   Ht '5\' 6"'$  (1.676 m)   Wt 129 lb 3.2 oz (58.6 kg)   LMP 03/12/2012   SpO2 96%   BMI 20.85 kg/m   General Appearance: Well nourished, in no apparent distress. Eyes: PERRLA, EOMs, conjunctiva no swelling or erythema Sinuses: No Frontal/maxillary tenderness ENT/Mouth: Ext aud canals clear, TMs without erythema, bulging. No erythema, swelling, or exudate on post pharynx.  Tonsils not swollen or erythematous. Hearing normal.  Neck: Supple, thyroid normal.  Respiratory: Respiratory effort normal, BS equal bilaterally without rales, rhonchi, wheezing or stridor.  Cardio: RRR with no MRGs. Brisk peripheral pulses without edema.  Abdomen: Soft, + BS.  Non tender, no guarding, rebound, hernias, masses. Lymphatics: Non tender without lymphadenopathy.  Musculoskeletal: Full ROM, 5/5 strength, normal gait.  Skin: Warm, dry without rashes, lesions, ecchymosis.  Neuro: Cranial nerves intact. Normal muscle tone, no cerebellar symptoms. Sensation intact. Very mild lowering of left side of mouth, eyebrows raise normally, no drooping of eye Psych: Awake and oriented X 3, normal affect, Insight and Judgment appropriate.     Alycia Rossetti, NP 9:58 AM [facility]

## 2022-09-17 ENCOUNTER — Encounter: Payer: Self-pay | Admitting: Internal Medicine

## 2022-09-27 NOTE — Progress Notes (Unsigned)
Complete Physical  Assessment and Plan:  Encounter for Annual Physical Exam with abnormal findings Due annually  Health Maintenance reviewed Healthy lifestyle reviewed and goals set Follow up with GYN, schedule mammogram Check with pharmacy about shingrix  Labile hypertension Well controlled at this visit; intermittently elevated in the past; monitor Monitor blood pressure at home; call if consistently over 130/80 Continue DASH diet.   Reminder to go to the ER if any CP, SOB, nausea, dizziness, severe HA, changes vision/speech, left arm numbness and tingling and jaw pain. -     Magnesium  Gastroesophageal reflux disease, esophagitis presence not specified Well managed on current medications Discuss PPI taper next visit  Discussed diet, avoiding triggers and other lifestyle changes -     Magnesium  Other migraine without status migrainosus, not intractable Fioricet works well; reports recently improved Stress management techniques discussed, increase water, good sleep hygiene discussed, increase exercise, and increase veggies.  -     Magnesium  Allergic state, subsequent encounter       -      Continue meds; avoid triggers  Bipolar disorder, active, most recent episode depressed ([place])       -      Continue medications, follow up with Dr. Pauline Good and [facility] mood disorder clinic  Abnormal glucose -     Hemoglobin A1c  Vitamin D deficiency -     VITAMIN D 25 Hydroxy (Vit-D Deficiency, Fractures)  Medication management -     CBC with Differential/Platelet -     CMP/GFR -     Magnesium  -     UA  Cholesterol Currently mild elevations managed by lifestyle modification Continue low cholesterol diet and exercise.  Check lipid panel.  -      Lipid panel -      TSH (defer as just had at GYN and reports normal)  Iron deficiency anemia  -     CBC with Differential/Platelet -     Iron panel, ferritin  Screening for hematuria or proteinuria -     Urinalysis,  Complete (81001) -     Microalbumin   Screening for thyroid disorder -     TSH  B12 def  -    B12 - checked per patient preference  No orders of the defined types were placed in this encounter.   Discussed med's effects and SE's. Screening labs and tests as requested with regular follow-up as recommended. Over 40 minutes of exam, counseling, chart review, and complex, high level critical decision making was performed this visit.   Future Appointments  Date Time Provider [place]  09/28/2022  9:00 AM Darrol Jump, NP [facility] None  11/09/2022 10:20 AM Bo Merino, MD [facility] None  09/29/2023  9:00 AM Darrol Jump, NP [facility] None     HPI  62 y.o. female  presents for a complete physical and follow up; she has Allergy; Sleep apnea; Migraine; Bipolar disorder (manic depression) ([place]); GERD (gastroesophageal reflux disease); Vitamin D deficiency; Medication management; Labile hypertension; Abnormal glucose; S/P cesarean section; Mixed hyperlipidemia; Stress at home; Caloric malnutrition ([place]); Aortic atherosclerosis ([place]) - per CT 01/30/2020; and Family history of Alzheimer's disease on their problem list.   She is separated since the covid pandemic, however husband still lives in same house (has health issues/PD), 3 children, 41 y/o twins (lots of stress, daughter not doing well with school), youngest is 61 years old. She is homemaker. Family is in [place] and not a lot of support.  Follows GYN Dr. Helane Rima- had appointment in 09/2021, goes annually, had normal PAP 07/2019.   Mother passed in September from Alzheimer's, feeling down and depressed, follows with psych, has started with new therapist and has upcoming grief counseling.   Had significant weight loss due to very poor appetite and oral intake, had unremarkable labs and cancer screenings.   She also had L sided Bell's palsy this year, unremarkable imaging in ED, resolved with antiviral and prolonged  steroid course.  Her mother has advanced alzheimer's, she was concerned about memory changes and saw neuro Dr. 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   She is followed by Dr. Santiago Glad Jones/ Terisa Starr for Bipolar depression/postpartum depression, followed by [facility] mood clinic. She is following with a counselor and finds this helpful. Currently on trintellix, latuda, lamictal and Buspar, taking daily.  She is on xanax/klonopin but with unintentional overdose requring ED visit in 04/2020, she reports has close follow up with mood provider, denies SI/HI.   She has CPAP machine and reports that she does not wear this as it is cumbersome and she is concerned she will not hear the children as she is sole caregiver for three children. Has lost significant weight since last sleep study. Declines pursuing follow up at this time.    She saw a chiropractor for HA and postpartum alignments which helped. She taking fioricet PRN, hasn't needed as much in recent months.  She reports reflux is well controlled with nexium for many years    Has allergies/hx of asthma improved with singulair, has albuterol but hasn't needed in years.   BMI is There is no height or weight on file to calculate BMI., she has not been working on diet and exercise, admits very poor appetite since mom passing, does eat 2 meals/day.  Wt Readings from Last 3 Encounters:  09/09/22 129 lb 3.2 oz (58.6 kg)  09/08/22 132 lb (59.9 kg)  08/09/22 132 lb (59.9 kg)   Today their BP is   She does not workout. She denies chest pain, shortness of breath, dizziness.   She is not on cholesterol medication and denies myalgias. Her cholesterol is not at goal. The cholesterol last visit was:   Lab Results  Component Value Date   CHOL 205 (H) 03/10/2022   HDL 66 03/10/2022   LDLCALC 122 (H) 03/10/2022   TRIG 80 03/10/2022   CHOLHDL 3.1 03/10/2022   She has not  been working on diet and exercise for hx of prediabetes, she is on bASA, she is not on ACE/ARB and denies hyperglycemia, hypoglycemia , polydipsia and polyuria. Last A1C in the office was:  Lab Results  Component Value Date   HGBA1C 5.3 09/09/2021   Last GFR: Lab Results  Component Value Date   GFRNONAA >60 01/28/2021   Patient is not currently on Vitamin D supplement, taking multivitamin, unsure of dose.   Lab Results  Component Value Date   VD25OH 32 09/09/2021     She had anemia this past year with low iron, had normal hemoccult 10/20/2020, was on multvitamin with iron until recently but stopped 2-3 months ago.  Lab Results  Component Value Date   IRON 147 09/09/2021   TIBC 304 09/09/2021   FERRITIN 55 09/09/2021     Current Medications:  Current Outpatient Medications on File Prior to Visit  Medication Sig Dispense Refill   ALPRAZolam (XANAX) 1 MG tablet Take 1 mg by mouth  at bedtime as needed.     aspirin EC 81 MG tablet Take 1 tablet (81 mg total) by mouth daily. (Patient taking differently: Take 81 mg by mouth at bedtime.)     azithromycin (ZITHROMAX) 250 MG tablet Take 2 tablets (500 mg) on  Day 1,  followed by 1 tablet (250 mg) once daily on Days 2 through 5. 6 each 1   benzonatate (TESSALON PERLES) 100 MG capsule Take 1 capsule (100 mg total) by mouth every 6 (six) hours as needed for cough. 30 capsule 1   busPIRone (BUSPAR) 10 MG tablet Take 10 mg by mouth daily.     butalbital-acetaminophen-caffeine (FIORICET) 50-325-40 MG tablet TAKE 1 TO 2 TABLETS EVERY 6 HOURS AS NEEDED ONSET OF HEADACHE. MAX 6 TABS PER EVENT. MAX 30 A MONTH 30 tablet 2   CLONAZEPAM PO Take 2 mg by mouth daily at 6 (six) AM.     dexamethasone (DECADRON) 4 MG tablet Take 3 tabs for 3 days, 2 tabs for 3 days 1 tab for 5 days. Take with food. 20 tablet 0   DimenhyDRINATE (MOTION SICKNESS RELIEF PO) Take 1-2 tablets by mouth at bedtime as needed (motion sickness).     esomeprazole (NEXIUM) 40 MG capsule  Take 40 mg by mouth daily at 6 (six) AM.     lamoTRIgine (LAMICTAL) 200 MG tablet Take 200 mg by mouth at bedtime.     Lurasidone HCl 60 MG TABS Take 60 mg by mouth at bedtime.     montelukast (SINGULAIR) 10 MG tablet TAKE ONE TABLET BY MOUTH EVERY DAY AT BEDTIME 90 tablet 3   NEXIUM 40 MG capsule TAKE ONE CAPSULE BY MOUTH EVERY DAY 90 capsule 1   promethazine (PHENERGAN) 25 MG tablet Take 1 tablet (25 mg total) by mouth every 6 (six) hours as needed for nausea or vomiting (can cause fatigue). Max: 4 tablets per day 30 tablet 0   promethazine-dextromethorphan (PROMETHAZINE-DM) 6.25-15 MG/5ML syrup Take 5 mLs by mouth 4 (four) times daily as needed for cough. 240 mL 1   SODIUM FLUORIDE 5000 PPM 1.1 % PSTE Take 1 Application by mouth in the morning and at bedtime. (Patient not taking: Reported on 09/09/2022)     traZODone (DESYREL) 100 MG tablet Take 1.5 tablets by mouth at bedtime.     vortioxetine HBr (TRINTELLIX) 20 MG TABS tablet Take 20 mg by mouth daily.     Current Facility-Administered Medications on File Prior to Visit  Medication Dose Route Frequency Provider Last Rate Last Admin   0.9 %  sodium chloride infusion  500 mL Intravenous Once Daryel November, MD       Allergies:  No Known Allergies  Medical History:  She has Allergy; Sleep apnea; Migraine; Bipolar disorder (manic depression) ([place]); GERD (gastroesophageal reflux disease); Vitamin D deficiency; Medication management; Labile hypertension; Abnormal glucose; S/P cesarean section; Mixed hyperlipidemia; Stress at home; Caloric malnutrition ([place]); Aortic atherosclerosis ([place]) - per CT 01/30/2020; and Family history of Alzheimer's disease on their problem list.  Health Maintenance:   Immunization History  Administered Date(s) Administered   Influenza Inj Mdck Quad With Preservative 07/13/2018   Influenza Split 07/10/2013, 07/04/2014, 06/17/2015   Influenza-Unspecified 08/01/2021   PFIZER(Purple Top)SARS-COV-2 Vaccination  12/07/2019, 12/22/2019, 03/30/2021   PPD Test 01/14/2014, 01/16/2015   Pneumococcal Polysaccharide-23 12/31/2011   Tdap 12/28/2010, 09/13/2016   Preventative care: Last colonoscopy: 08/2022 Recall 7 years Last mammogram: 10/10/2020 [facility], she will call to schedule *** Last pap smear/pelvic exam: 2020 with GYN,  has upcomin GYN appointment in Jan 2023 DEXA: n/a  Vaccinations: TD or Tdap: 2018  Influenza: 06/2021 Pneumococcal: 2013 Shingles: Due, discussed with patient, check with insruance  Covid 19: 2/2, 2021, pfizer - boosted 03/2021  Names of Other Physician/Practitioners you currently use: 1. [facility] here for primary care 2. Eye Exam, [place], last 2022, glasses, gets botox injections for spasms 3  Dentist, [place], 2022, goes q37m  Surgical History:  She has a past surgical history that includes Nasal septum surgery ((4315; Tonsillectomy (1966); Cesarean section (2009); Colonoscopy (06/2012); Gynecologic cryosurgery; and Cesarean section (N/A, 03/09/2017). Family History:  Herfamily history includes ADD / ADHD in her daughter; Alzheimer's disease (age of onset: 843 in her mother; Arrhythmia in her father; Asperger's syndrome in her daughter; Bipolar disorder in her son; Colon cancer (age of onset: 560 in her paternal uncle; Colon cancer (age of onset: 653 in her paternal uncle; Hearing loss in her daughter; Intellectual disability in her daughter; Polycystic ovary syndrome (age of onset: 121 in her daughter; Prostate cancer in her father; Rheum arthritis in her paternal grandmother; Strabismus in her daughter; Stroke in her maternal grandfather and maternal grandmother. Social History:  She reports that she has never smoked. She has never used smokeless tobacco. She reports that she does not drink alcohol and does not use drugs.   Review of Systems: Review of Systems  Constitutional:  Positive for malaise/fatigue. Negative  for weight loss.  HENT:  Negative for hearing loss and tinnitus.   Eyes:  Negative for blurred vision and double vision.  Respiratory:  Negative for cough, shortness of breath and wheezing.   Cardiovascular:  Negative for chest pain, palpitations, orthopnea, claudication and leg swelling.  Gastrointestinal:  Negative for abdominal pain, blood in stool, constipation, diarrhea, heartburn, melena, nausea and vomiting.  Genitourinary: Negative.   Musculoskeletal:  Negative for joint pain and myalgias.  Skin:  Negative for rash.  Neurological:  Negative for dizziness, tingling, sensory change, weakness and headaches (improved).  Endo/Heme/Allergies:  Negative for polydipsia.  Psychiatric/Behavioral:  Positive for depression. Negative for hallucinations, memory loss, substance abuse and suicidal ideas. The patient has insomnia.   All other systems reviewed and are negative.   Physical Exam: Estimated body mass index is 20.85 kg/m as calculated from the following:   Height as of 09/09/22: '5\' 6"'$  (1.676 m).   Weight as of 09/09/22: 129 lb 3.2 oz (58.6 kg). LMP 03/12/2012  General Appearance: Well nourished, well developed adult female, appears fatigued in no apparent distress.  Eyes: PERRLA, EOMs, conjunctiva no swelling or erythema,  Sinuses: No Frontal/maxillary tenderness  ENT/Mouth: Ext aud canals clear. TMs pearly grey without distention.  Good dentition. No erythema, swelling, or exudate on post pharynx. Tonsils not swollen or erythematous. Hearing normal.  Neck: Supple, thyroid normal. No bruits  Respiratory: Respiratory effort normal, BS equal bilaterally without rales, rhonchi, wheezing or stridor.  Cardio: RRR without murmurs, rubs or gallops. Brisk peripheral pulses without edema.  Chest: symmetric, with normal excursions and percussion.  Breasts: Defer to gyn  Abdomen: Soft, nontender, no guarding, rebound, hernias, masses, or organomegaly.  Lymphatics: Non tender without  lymphadenopathy.  Genitourinary: defer to GYN Musculoskeletal: Full ROM all peripheral extremities, 5/5 strength, and normal gait. She does have neck muscular tension and tenderness.  Skin: Warm, dry without rashes, lesions, ecchymosis. Neuro: Cranial nerves intact, reflexes equal bilaterally. Normal muscle tone, no cerebellar symptoms. Sensation intact.  Psych: Awake and oriented X 3,  depressed affect, Insight and Judgment appropriate.   EKG: NSR, ICRBBB   Ilona Colley, NP 10:14 PM [facility]

## 2022-09-28 ENCOUNTER — Encounter: Payer: Self-pay | Admitting: Nurse Practitioner

## 2022-09-28 ENCOUNTER — Ambulatory Visit (INDEPENDENT_AMBULATORY_CARE_PROVIDER_SITE_OTHER): Admitting: Nurse Practitioner

## 2022-09-28 VITALS — BP 116/68 | HR 80 | Temp 99.0°F | Ht 64.5 in | Wt 130.0 lb

## 2022-09-28 DIAGNOSIS — E782 Mixed hyperlipidemia: Secondary | ICD-10-CM

## 2022-09-28 DIAGNOSIS — Z23 Encounter for immunization: Secondary | ICD-10-CM | POA: Diagnosis not present

## 2022-09-28 DIAGNOSIS — Z0001 Encounter for general adult medical examination with abnormal findings: Secondary | ICD-10-CM

## 2022-09-28 DIAGNOSIS — K219 Gastro-esophageal reflux disease without esophagitis: Secondary | ICD-10-CM

## 2022-09-28 DIAGNOSIS — Z79899 Other long term (current) drug therapy: Secondary | ICD-10-CM | POA: Diagnosis not present

## 2022-09-28 DIAGNOSIS — Z136 Encounter for screening for cardiovascular disorders: Secondary | ICD-10-CM

## 2022-09-28 DIAGNOSIS — G44219 Episodic tension-type headache, not intractable: Secondary | ICD-10-CM

## 2022-09-28 DIAGNOSIS — Z131 Encounter for screening for diabetes mellitus: Secondary | ICD-10-CM | POA: Diagnosis not present

## 2022-09-28 DIAGNOSIS — E559 Vitamin D deficiency, unspecified: Secondary | ICD-10-CM

## 2022-09-28 DIAGNOSIS — Z13220 Encounter for screening for lipoid disorders: Secondary | ICD-10-CM

## 2022-09-28 DIAGNOSIS — T7840XD Allergy, unspecified, subsequent encounter: Secondary | ICD-10-CM

## 2022-09-28 DIAGNOSIS — R0989 Other specified symptoms and signs involving the circulatory and respiratory systems: Secondary | ICD-10-CM

## 2022-09-28 DIAGNOSIS — G245 Blepharospasm: Secondary | ICD-10-CM

## 2022-09-28 DIAGNOSIS — Z0000 Encounter for general adult medical examination without abnormal findings: Secondary | ICD-10-CM | POA: Diagnosis not present

## 2022-09-28 DIAGNOSIS — F3175 Bipolar disorder, in partial remission, most recent episode depressed: Secondary | ICD-10-CM

## 2022-09-28 DIAGNOSIS — Z1389 Encounter for screening for other disorder: Secondary | ICD-10-CM

## 2022-09-28 DIAGNOSIS — I700 Atherosclerosis of aorta: Secondary | ICD-10-CM

## 2022-09-28 DIAGNOSIS — G43809 Other migraine, not intractable, without status migrainosus: Secondary | ICD-10-CM

## 2022-09-28 DIAGNOSIS — E538 Deficiency of other specified B group vitamins: Secondary | ICD-10-CM

## 2022-09-28 DIAGNOSIS — Z1329 Encounter for screening for other suspected endocrine disorder: Secondary | ICD-10-CM

## 2022-09-28 DIAGNOSIS — R7309 Other abnormal glucose: Secondary | ICD-10-CM

## 2022-09-28 MED ORDER — BUTALBITAL-APAP-CAFFEINE 50-325-40 MG PO TABS: ORAL_TABLET | ORAL | 0 refills | Status: DC

## 2022-09-28 NOTE — Patient Instructions (Addendum)
Pursue a combination of weight-bearing exercises and strength training. Patients with severe mobility impairment should be referred for physical therapy. Advised on fall prevention measures including proper lighting in all rooms, removal of area rugs and floor clutter, use of walking devices as deemed appropriate, avoidance of uneven walking surfaces. Smoking cessation and moderate alcohol consumption if applicable Consume 497 to 1000 IU of vitamin D daily with a goal vitamin D serum value of 30 ng/mL or higher. Aim for 1000 to 1200 mg of elemental calcium daily through supplements and/or dietary sources.  Good Vitamins to take for overall health Vitamin D/Calcium Zinc Super B12 Complex  Vitamin C  Healthy Eating Following a healthy eating pattern may help you to achieve and maintain a healthy body weight, reduce the risk of chronic disease, and live a long and productive life. It is important to follow a healthy eating pattern at an appropriate calorie level for your body. Your nutritional needs should be met primarily through food by choosing a variety of nutrient-rich foods. What are tips for following this plan? Reading food labels Read labels and choose the following: Reduced or low sodium. Juices with 100% fruit juice. Foods with low saturated fats and high polyunsaturated and monounsaturated fats. Foods with whole grains, such as whole wheat, cracked wheat, brown rice, and wild rice. Whole grains that are fortified with folic acid. This is recommended for women who are pregnant or who want to become pregnant. Read labels and avoid the following: Foods with a lot of added sugars. These include foods that contain brown sugar, corn sweetener, corn syrup, dextrose, fructose, glucose, high-fructose corn syrup, honey, invert sugar, lactose, malt syrup, maltose, molasses, raw sugar, sucrose, trehalose, or turbinado sugar. Do not eat more than the following amounts of added sugar per day: 6  teaspoons (25 g) for women. 9 teaspoons (38 g) for men. Foods that contain processed or refined starches and grains. Refined grain products, such as white flour, degermed cornmeal, white bread, and white rice. Shopping Choose nutrient-rich snacks, such as vegetables, whole fruits, and nuts. Avoid high-calorie and high-sugar snacks, such as potato chips, fruit snacks, and candy. Use oil-based dressings and spreads on foods instead of solid fats such as butter, stick margarine, or cream cheese. Limit pre-made sauces, mixes, and "instant" products such as flavored rice, instant noodles, and ready-made pasta. Try more plant-protein sources, such as tofu, tempeh, black beans, edamame, lentils, nuts, and seeds. Explore eating plans such as the Mediterranean diet or vegetarian diet. Cooking Use oil to saut or stir-fry foods instead of solid fats such as butter, stick margarine, or lard. Try baking, boiling, grilling, or broiling instead of frying. Remove the fatty part of meats before cooking. Steam vegetables in water or broth. Meal planning  At meals, imagine dividing your plate into fourths: One-half of your plate is fruits and vegetables. One-fourth of your plate is whole grains. One-fourth of your plate is protein, especially lean meats, poultry, eggs, tofu, beans, or nuts. Include low-fat dairy as part of your daily diet. Lifestyle Choose healthy options in all settings, including home, work, school, restaurants, or stores. Prepare your food safely: Wash your hands after handling raw meats. Keep food preparation surfaces clean by regularly washing with hot, soapy water. Keep raw meats separate from ready-to-eat foods, such as fruits and vegetables. Cook seafood, meat, poultry, and eggs to the recommended internal temperature. Store foods at safe temperatures. In general: Keep cold foods at 82F (4.4C) or below. Keep hot foods at 182F (  60C) or above. Keep your freezer at [facility]  (-17.8C) or below. Foods are no longer safe to eat when they have been between the temperatures of 40-140F (4.4-60C) for more than 2 hours. What foods should I eat? Fruits Aim to eat 2 cup-equivalents of fresh, canned (in natural juice), or frozen fruits each day. Examples of 1 cup-equivalent of fruit include 1 small apple, 8 large strawberries, 1 cup canned fruit,  cup dried fruit, or 1 cup 100% juice. Vegetables Aim to eat 2-3 cup-equivalents of fresh and frozen vegetables each day, including different varieties and colors. Examples of 1 cup-equivalent of vegetables include 2 medium carrots, 2 cups raw, leafy greens, 1 cup chopped vegetable (raw or cooked), or 1 medium baked potato. Grains Aim to eat 6 ounce-equivalents of whole grains each day. Examples of 1 ounce-equivalent of grains include 1 slice of bread, 1 cup ready-to-eat cereal, 3 cups popcorn, or  cup cooked rice, pasta, or cereal. Meats and other proteins Aim to eat 5-6 ounce-equivalents of protein each day. Examples of 1 ounce-equivalent of protein include 1 egg, 1/2 cup nuts or seeds, or 1 tablespoon (16 g) peanut butter. A cut of meat or fish that is the size of a deck of cards is about 3-4 ounce-equivalents. Of the protein you eat each week, try to have at least 8 ounces come from seafood. This includes salmon, trout, herring, and anchovies. Dairy Aim to eat 3 cup-equivalents of fat-free or low-fat dairy each day. Examples of 1 cup-equivalent of dairy include 1 cup (240 mL) milk, 8 ounces (250 g) yogurt, 1 ounces (44 g) natural cheese, or 1 cup (240 mL) fortified soy milk. Fats and oils Aim for about 5 teaspoons (21 g) per day. Choose monounsaturated fats, such as canola and olive oils, avocados, peanut butter, and most nuts, or polyunsaturated fats, such as sunflower, corn, and soybean oils, walnuts, pine nuts, sesame seeds, sunflower seeds, and flaxseed. Beverages Aim for six 8-oz glasses of water per day. Limit coffee to  three to five 8-oz cups per day. Limit caffeinated beverages that have added calories, such as soda and energy drinks. Limit alcohol intake to no more than 1 drink a day for nonpregnant women and 2 drinks a day for men. One drink equals 12 oz of beer (355 mL), 5 oz of wine (148 mL), or 1 oz of hard liquor (44 mL). Seasoning and other foods Avoid adding excess amounts of salt to your foods. Try flavoring foods with herbs and spices instead of salt. Avoid adding sugar to foods. Try using oil-based dressings, sauces, and spreads instead of solid fats. This information is based on general [place] nutrition guidelines. For more information, visit [employer]. Exact amounts may vary based on your nutrition needs. Summary A healthy eating plan may help you to maintain a healthy weight, reduce the risk of chronic diseases, and stay active throughout your life. Plan your meals. Make sure you eat the right portions of a variety of nutrient-rich foods. Try baking, boiling, grilling, or broiling instead of frying. Choose healthy options in all settings, including home, work, school, restaurants, or stores. This information is not intended to replace advice given to you by your health care provider. Make sure you discuss any questions you have with your health care provider. Document Revised: 02/10/2022 Document Reviewed: 04/28/2021 Elsevier Patient Education  [place].

## 2022-09-29 LAB — CBC WITH DIFFERENTIAL/PLATELET
Absolute Monocytes: 389 cells/uL (ref 200–950)
Basophils Absolute: 70 cells/uL (ref 0–200)
Basophils Relative: 1.2 %
Eosinophils Absolute: 110 cells/uL (ref 15–500)
Eosinophils Relative: 1.9 %
HCT: 39.9 % (ref 35.0–45.0)
Hemoglobin: 13.6 g/dL (ref 11.7–15.5)
Lymphs Abs: 870 cells/uL (ref 850–3900)
MCH: 32.5 pg (ref 27.0–33.0)
MCHC: 34.1 g/dL (ref 32.0–36.0)
MCV: 95.5 fL (ref 80.0–100.0)
MPV: 10.3 fL (ref 7.5–12.5)
Monocytes Relative: 6.7 %
Neutro Abs: 4362 cells/uL (ref 1500–7800)
Neutrophils Relative %: 75.2 %
Platelets: 412 10*3/uL — ABNORMAL HIGH (ref 140–400)
RBC: 4.18 10*6/uL (ref 3.80–5.10)
RDW: 11.9 % (ref 11.0–15.0)
Total Lymphocyte: 15 %
WBC: 5.8 10*3/uL (ref 3.8–10.8)

## 2022-09-29 LAB — MICROSCOPIC MESSAGE

## 2022-09-29 LAB — URINALYSIS, ROUTINE W REFLEX MICROSCOPIC
Bacteria, UA: NONE SEEN /HPF
Bilirubin Urine: NEGATIVE
Glucose, UA: NEGATIVE
Hyaline Cast: NONE SEEN /LPF
Ketones, ur: NEGATIVE
Leukocytes,Ua: NEGATIVE
Nitrite: NEGATIVE
Protein, ur: NEGATIVE
Specific Gravity, Urine: 1.023 (ref 1.001–1.035)
Squamous Epithelial / HPF: NONE SEEN /HPF (ref <=5)
WBC, UA: NONE SEEN /HPF (ref 0–5)
pH: 5.5 (ref 5.0–8.0)

## 2022-09-29 LAB — COMPLETE METABOLIC PANEL WITH GFR
AG Ratio: 1.8 (calc) (ref 1.0–2.5)
ALT: 10 U/L (ref 6–29)
AST: 8 U/L — ABNORMAL LOW (ref 10–35)
Albumin: 4.3 g/dL (ref 3.6–5.1)
Alkaline phosphatase (APISO): 55 U/L (ref 37–153)
BUN: 19 mg/dL (ref 7–25)
CO2: 28 mmol/L (ref 20–32)
Calcium: 8.9 mg/dL (ref 8.6–10.4)
Chloride: 104 mmol/L (ref 98–110)
Creat: 0.84 mg/dL (ref 0.50–1.05)
Globulin: 2.4 g/dL (calc) (ref 1.9–3.7)
Glucose, Bld: 98 mg/dL (ref 65–99)
Potassium: 4.4 mmol/L (ref 3.5–5.3)
Sodium: 139 mmol/L (ref 135–146)
Total Bilirubin: 0.3 mg/dL (ref 0.2–1.2)
Total Protein: 6.7 g/dL (ref 6.1–8.1)
eGFR: 80 mL/min/{1.73_m2} (ref >=60)

## 2022-09-29 LAB — TSH: TSH: 0.84 mIU/L (ref 0.40–4.50)

## 2022-09-29 LAB — HEMOGLOBIN A1C
Hgb A1c MFr Bld: 5.5 % of total Hgb (ref <5.7)
Mean Plasma Glucose: 111 mg/dL
eAG (mmol/L): 6.2 mmol/L

## 2022-09-29 LAB — LIPID PANEL
Cholesterol: 206 mg/dL — ABNORMAL HIGH (ref <200)
HDL: 66 mg/dL (ref >=50)
LDL Cholesterol (Calc): 109 mg/dL (calc) — ABNORMAL HIGH
Non-HDL Cholesterol (Calc): 140 mg/dL (calc) — ABNORMAL HIGH (ref <130)
Total CHOL/HDL Ratio: 3.1 (calc) (ref <5.0)
Triglycerides: 194 mg/dL — ABNORMAL HIGH (ref <150)

## 2022-09-29 LAB — MICROALBUMIN / CREATININE URINE RATIO
Creatinine, Urine: 186 mg/dL (ref 20–275)
Microalb Creat Ratio: 6 mcg/mg creat (ref <30)
Microalb, Ur: 1.1 mg/dL

## 2022-09-29 LAB — MAGNESIUM: Magnesium: 2.1 mg/dL (ref 1.5–2.5)

## 2022-09-29 LAB — VITAMIN D 25 HYDROXY (VIT D DEFICIENCY, FRACTURES): Vit D, 25-Hydroxy: 28 ng/mL — ABNORMAL LOW (ref 30–100)

## 2022-09-29 LAB — INSULIN, RANDOM: Insulin: 14.6 u[IU]/mL

## 2022-10-15 LAB — HM MAMMOGRAPHY

## 2022-10-19 ENCOUNTER — Ambulatory Visit: Admitting: Rheumatology

## 2022-10-26 NOTE — Progress Notes (Signed)
Office Visit Note  Patient: Bonnie Hall             Date of Birth: 1960-10-19           MRN: [id number]             PCP: Unk Pinto, MD Referring: Darrol Jump, NP Visit Date: 11/09/2022 Occupation: '@GUAROCC'$ @  Subjective:  Pain in joints and positive ANA  History of Present Illness: DALISA KUEHLER is a 62 y.o. female in consultation per request of her PCP for the evaluation of joint pain and positive ANA.  Patient states for the last 5 years she has been experiencing nocturnal pain due to discomfort in her hips, knee joints and her feet.  She also has discomfort in her other joints including her shoulders elbows and hands.She has not noticed any joint swelling.  She states difficult for her to sleep on her side due to hip pain.  She experiences fatigue.  She also experiences pain in the leg muscles especially in the morning.  She experiences tingling sensation in her lower legs.  She states the symptoms get  better after the shower.  She has chronic neck and lower back pain.  She sees a Restaurant manager, fast food.  She denies any radiculopathy.  There is no family history of autoimmune disease.  She is gravida 2, para 3.  She has a lot of stress at home.  She states her husband is disabled veteran.  She is 52 year old twins, 1 son has disability.  He has a 48-year-old daughter who is disabled.  She states difficult for her to lift her daughter who is more than 50 pounds now.    Activities of Daily Living:  Patient reports morning stiffness for 30-60 minutes.  Aches all day long. Patient Reports nocturnal pain.  Difficulty dressing/grooming: Denies Difficulty climbing stairs: Denies Difficulty getting out of chair: Denies Difficulty using hands for taps, buttons, cutlery, and/or writing: Denies  Review of Systems  Constitutional:  Positive for fatigue.  HENT:  Positive for mouth dryness. Negative for mouth sores.   Eyes:  Positive for dryness.  Respiratory: Negative.  Negative  for shortness of breath.   Cardiovascular: Negative.  Negative for chest pain and palpitations.  Gastrointestinal: Negative.  Negative for blood in stool, constipation and diarrhea.  Endocrine: Negative.  Negative for increased urination.  Genitourinary: Negative.  Negative for involuntary urination.  Musculoskeletal:  Positive for joint pain, joint pain, myalgias, muscle weakness, morning stiffness, muscle tenderness and myalgias. Negative for gait problem and joint swelling.  Skin: Negative.  Negative for color change, rash, hair loss and sensitivity to sunlight.  Allergic/Immunologic: Negative.  Negative for susceptible to infections.  Neurological:  Positive for headaches. Negative for dizziness.  Hematological: Negative.  Negative for swollen glands.  Psychiatric/Behavioral:  Positive for depressed mood and sleep disturbance. The patient is nervous/anxious.     PMFS History:  Patient Active Problem List   Diagnosis Date Noted   Family history of Alzheimer's disease 04/14/2021   Aortic atherosclerosis ([place]) - per CT 01/30/2020 01/30/2020   Stress at home 10/01/2019   Caloric malnutrition ([place]) 10/01/2019   Mixed hyperlipidemia 07/31/2019   S/P cesarean section 03/09/2017   Abnormal glucose 01/16/2015   Vitamin D deficiency 01/14/2014   Medication management 01/14/2014   Labile hypertension 01/14/2014   Allergy    Sleep apnea    Migraine    Bipolar disorder (manic depression) (HCC)    GERD (gastroesophageal reflux disease)     Past  Medical History:  Diagnosis Date   Allergy    Hay fever/exercise induced asthma   AMA (advanced maternal age) multigravida 35+    Anxiety    on meds   Arthritis    generalized   Asthma    Blood transfusion 1990's   Depression    bipolar=on meds   GERD (gastroesophageal reflux disease)    on meds   Hyperlipidemia    on meds   Iron deficiency anemia 1990's   negative work up as to etiology; required iron infusion, blood transfusion.  Saw  an hematologist  at [facility].    Left-sided Bell's palsy 02/03/2021   Migraine    Newborn product of in vitro fertilization (IVF) pregnancy    Pregnancy induced hypertension    on meds   Preterm labor    Sleep apnea    no CPAP   Thrombocytosis    Vaginal Pap smear, abnormal     Family History  Problem Relation Age of Onset   Alzheimer's disease Mother 66   Prostate cancer Father    Arrhythmia Father    Colon cancer Paternal Uncle 82   Colon cancer Paternal Uncle 30   Stroke Maternal Grandmother    Stroke Maternal Grandfather    Rheum arthritis Paternal Grandmother    Hearing loss Daughter    Strabismus Daughter    Asperger's syndrome Daughter    ADD / ADHD Daughter    Intellectual disability Daughter    Polycystic ovary syndrome Daughter 75   Bipolar disorder Son    Colon polyps Neg Hx    Esophageal cancer Neg Hx    Stomach cancer Neg Hx    Rectal cancer Neg Hx    Past Surgical History:  Procedure Laterality Date   CESAREAN SECTION  2009   CESAREAN SECTION N/A 03/09/2017   Procedure: REPEAT CESAREAN SECTION;  Surgeon: Dian Queen, MD;  Location: [facility];  Service: Obstetrics;  Laterality: N/A;   COLONOSCOPY  06/2012   Dr.Kaplan-MAC-movi(exc)-normal-10 yr recall   [place]   Social History   Social History Narrative   Right handed   Drinks caffeine   Two story hom   Immunization History  Administered Date(s) Administered   Influenza Inj Mdck Quad With Preservative 07/13/2018   Influenza Split 07/10/2013, 07/04/2014, 06/17/2015   Influenza,inj,Quad PF,6+ Mos 09/28/2022   Influenza-Unspecified 08/01/2021   PFIZER(Purple Top)SARS-COV-2 Vaccination 12/07/2019, 12/22/2019, 03/30/2021   PPD Test 01/14/2014, 01/16/2015   Pneumococcal Polysaccharide-23 12/31/2011   Tdap 12/28/2010, 09/13/2016     Objective: Vital Signs: BP 112/75 (BP Location: Right Arm, Patient Position: Sitting,  Cuff Size: Normal)   Pulse 74   Resp 16   Ht '5\' 5"'$  (1.651 m)   Wt 132 lb (59.9 kg)   LMP 03/12/2012   Breastfeeding No   BMI 21.97 kg/m    Physical Exam Vitals and nursing note reviewed.  Constitutional:      Appearance: She is well-developed.  HENT:     Head: Normocephalic and atraumatic.  Eyes:     Conjunctiva/sclera: Conjunctivae normal.  Cardiovascular:     Rate and Rhythm: Normal rate and regular rhythm.     Heart sounds: Normal heart sounds.  Pulmonary:     Effort: Pulmonary effort is normal.     Breath sounds: Normal breath sounds.  Abdominal:     General: Bowel sounds are normal.     Palpations: Abdomen is soft.  Musculoskeletal:     Cervical back: Normal range of motion.  Lymphadenopathy:     Cervical: No cervical adenopathy.  Skin:    General: Skin is warm and dry.     Capillary Refill: Capillary refill takes less than 2 seconds.  Neurological:     Mental Status: She is alert and oriented to person, place, and time.  Psychiatric:        Behavior: Behavior normal.      Musculoskeletal Exam: She had limited lateral rotation of the cervical spine.  She had thoracic kyphosis.  She had discomfort range of motion of her lumbar spine without any point tenderness.  Shoulder joints, elbow joints, wrist joints, MCPs PIPs and DIPs been good range of motion with no synovitis.  She had bilateral PIP and DIP thickening more prominent in the right hand consistent with osteoarthritis.  Hip joints were in good range of motion.  She had tenderness over bilateral trochanteric bursa.  Knee joints were in good range of motion without any warmth swelling or effusion.  She had bilateral pes cavus with hammertoes and dorsal spurs.  CDAI Exam: CDAI Score: -- Patient Global: --; Provider Global: -- Swollen: --; Tender: -- Joint Exam 11/09/2022   No joint exam has been documented for this visit   There is currently no information documented on the homunculus. Go to the Rheumatology  activity and complete the homunculus joint exam.  Investigation: No additional findings.  Imaging: XR Lumbar Spine 2-3 Views  Result Date: 11/09/2022 Multilevel spondylosis was noted.  Significant narrowing was noted between T12-L1, L1-L2, L3-L4, L4-L5 with spondylolisthesis between L4 and L5.  Multilevel facet joint arthropathy was noted.  No SI joint narrowing or sclerosis was noted. Impression: Multilevel spondylosis with facet joint arthropathy was noted.  XR Cervical Spine 2 or 3 views  Result Date: 11/09/2022 Multilevel spondylosis with C5-C6 narrowing and spondylolisthesis was noted.  C6-C7 narrowing was noted.  Facet joint arthropathy was noted. Impression: These findings are consistent with multilevel spondylosis and facet joint arthropathy as described above.  XR Foot 2 Views Left  Result Date: 11/09/2022 PIP and DIP narrowing was noted.  No MTP, intertarsal, tibiotalar or subtalar joint space narrowing was noted.  No erosive changes were noted. Impression: These findings are consistent with osteoarthritis of the foot.  XR Foot 2 Views Right  Result Date: 11/09/2022 PIP and DIP narrowing was noted.  No MTP, intertarsal, tibiotalar or subtalar joint space narrowing was noted.  No erosive changes were noted. Impression: These findings are consistent with osteoarthritis of the foot.  XR KNEE 3 VIEW LEFT  Result Date: 11/09/2022 No medial or lateral compartment narrowing was noted.  No patellofemoral narrowing was noted.  No chondrocalcinosis was noted. Impression: Unremarkable x-rays of the knee.  XR KNEE 3 VIEW RIGHT  Result Date: 11/09/2022 No medial or lateral compartment narrowing was noted.  No patellofemoral narrowing was noted.  No chondrocalcinosis was noted. Impression: Unremarkable x-rays of the knee.  XR Hand 2 View Left  Result Date: 11/09/2022 CMC, PIP and DIP narrowing was noted.  No MCP, intercarpal or radiocarpal joint space narrowing was noted.  No erosive  changes were noted. Impression: These findings are consistent with osteoarthritis of the hand.  XR Hand 2 View Right  Result Date: 11/09/2022 CMC, PIP and DIP narrowing was noted.  No MCP, intercarpal or radiocarpal joint space narrowing was noted.  No erosive changes were noted. Impression: These findings are consistent with osteoarthritis of the hand.  Recent Labs: Lab Results  Component Value Date   WBC 5.8 09/28/2022   HGB 13.6 09/28/2022   PLT 412 (H) 09/28/2022   NA 139 09/28/2022   K 4.4 09/28/2022   CL 104 09/28/2022   CO2 28 09/28/2022   GLUCOSE 98 09/28/2022   BUN 19 09/28/2022   CREATININE 0.84 09/28/2022   BILITOT 0.3 09/28/2022   ALKPHOS 53 01/28/2021   AST 8 (L) 09/28/2022   ALT 10 09/28/2022   PROT 6.7 09/28/2022   ALBUMIN 4.5 01/28/2021   CALCIUM 8.9 09/28/2022   GFRAA 105 09/09/2020    Speciality Comments: No specialty comments available.  Procedures:  No procedures performed Allergies: Patient has no known allergies.   Assessment / Plan:     Visit Diagnoses: Polyarthralgia -patient complains of pain and discomfort in multiple joints over the last few years.  No synovitis was noted on the examination.  03/10/22: CRP<0.2, ESR 2, ANA, SM-, ANA 1:80 nuclear, dense fine speckled, anti-CCP<16.  Her ANA is low titer positive.  She denies any history of oral ulcers, nasal ulcers, malar rash, photosensitivity, Raynaud's, lymphadenopathy.  She gives history of dry mouth and dry eyes which could be related to medications.  I will obtain additional labs today.  Pain in both hands -she complains of discomfort in her bilateral hands.  Bilateral PIP and DIP thickening was noted.  She complains of intermittent swelling in her hands.  Plan: XR Hand 2 View Right, XR Hand 2 View Left, x-rays of bilateral hands were consistent with osteoarthritis.  Rheumatoid factor  Trochanteric bursitis of both hips-she gives history of nocturnal pain in her hips.  She had tenderness over  bilateral trochanteric bursa.  A handout 90-minute stretches was given.  Chronic pain of both knees -she complains of pain and discomfort in her bilateral knee joints.  No synovitis, warmth swelling or effusion was noted.  Plan: XR KNEE 3 VIEW RIGHT, XR KNEE 3 VIEW LEFT.  X-rays of bilateral knee joints were unremarkable.  Pain in both feet -she complains of discomfort in her bilateral feet.  No synovitis was noted.  She had bilateral pes cavus, dorsal spurs and hammertoes.  Plan: XR Foot 2 Views Right, XR Foot 2 Views Left.  Feet x-rays were consistent with osteoarthritis.  Neck pain -she had limited range of motion of her cervical spine.  She has history of chronically stiffness and discomfort.  She has been followed by a chiropractor.  Plan: XR Cervical Spine 2 or 3 views multilevel spondylosis and facet joint arthropathy was noted.  Significant C5-C6 and C6-7 narrowing was noted.  Postural kyphosis of thoracic region  Chronic midline low back pain without sciatica -she has lower back pain.  She has been followed by chiropractor.  Plan: XR Lumbar Spine 2-3 Views multilevel spondylosis with facet joint arthropathy was noted.  Positive ANA (antinuclear antibody) - ANA 1:80 nuclear, dense fine speckled -there is no history of oral ulcers, nasal ulcers, malar rash, photosensitivity, Raynaud's, lymphadenopathy.  She gives history of fatigue, arthralgias and sicca symptoms.  Plan: Protein / creatinine ratio, urine, Anti-DNA antibody, double-stranded, C3 and C4, RNP Antibody, Sjogrens syndrome-A extractable nuclear antibody, Sjogrens syndrome-B extractable nuclear antibody  Other fatigue -most likely related to insomnia.  Plan: CK, TSH  Osteoporosis screening-patient is postmenopausal.  Patient does not recall having a DEXA scan.  Will schedule DEXA scan.  Postmenopausal  Aortic atherosclerosis ([place]) - per CT 01/30/2020  Labile hypertension-patient blood pressure was normal at 112/75.  She is  currently not taking any medications.  Mixed hyperlipidemia  History of gastroesophageal reflux (GERD)  Vitamin D deficiency-vitamin D was low at 28 on September 28, 2022.  Patient states she was taking vitamin D 5000 units daily for a month and then she stopped it.  I advised her to take vitamin D 2000 units daily.  Other migraine without status migrainosus, not intractable  Bipolar disorder, in partial remission, most recent episode depressed ([place])  Stress at home-patient is under a lot of stress.  She states that her husband is disabled veteran.  She has 2 children with disability.  Family history of Alzheimer's disease  Orders: Orders Placed This Encounter  Procedures   XR Hand 2 View Right   XR Hand 2 View Left   XR KNEE 3 VIEW RIGHT   XR KNEE 3 VIEW LEFT   XR Foot 2 Views Right   XR Foot 2 Views Left   XR Cervical Spine 2 or 3 views   XR Lumbar Spine 2-3 Views   DG BONE DENSITY (DXA)   Protein / creatinine ratio, urine   Anti-DNA antibody, double-stranded   C3 and C4   CK   TSH   RNP Antibody   Sjogrens syndrome-A extractable nuclear antibody   Sjogrens syndrome-B extractable nuclear antibody   Rheumatoid factor   No orders of the defined types were placed in this encounter.    Follow-Up Instructions: Return for +ANA, joint pain.   Bo Merino, MD  Note - This record has been created using Editor, commissioning.  Chart creation errors have been sought, but may not always  have been located. Such creation errors do not reflect on  the standard of medical care.

## 2022-11-09 ENCOUNTER — Ambulatory Visit

## 2022-11-09 ENCOUNTER — Ambulatory Visit (INDEPENDENT_AMBULATORY_CARE_PROVIDER_SITE_OTHER)

## 2022-11-09 ENCOUNTER — Ambulatory Visit: Attending: Rheumatology | Admitting: Rheumatology

## 2022-11-09 ENCOUNTER — Encounter: Payer: Self-pay | Admitting: Rheumatology

## 2022-11-09 VITALS — BP 112/75 | HR 74 | Resp 16 | Ht 65.0 in | Wt 132.0 lb

## 2022-11-09 DIAGNOSIS — Z820 Family history of epilepsy and other diseases of the nervous system: Secondary | ICD-10-CM

## 2022-11-09 DIAGNOSIS — M79641 Pain in right hand: Secondary | ICD-10-CM | POA: Diagnosis not present

## 2022-11-09 DIAGNOSIS — M79672 Pain in left foot: Secondary | ICD-10-CM | POA: Diagnosis not present

## 2022-11-09 DIAGNOSIS — M545 Low back pain, unspecified: Secondary | ICD-10-CM

## 2022-11-09 DIAGNOSIS — Z98891 History of uterine scar from previous surgery: Secondary | ICD-10-CM

## 2022-11-09 DIAGNOSIS — I700 Atherosclerosis of aorta: Secondary | ICD-10-CM

## 2022-11-09 DIAGNOSIS — E46 Unspecified protein-calorie malnutrition: Secondary | ICD-10-CM

## 2022-11-09 DIAGNOSIS — G8929 Other chronic pain: Secondary | ICD-10-CM

## 2022-11-09 DIAGNOSIS — M25561 Pain in right knee: Secondary | ICD-10-CM | POA: Diagnosis not present

## 2022-11-09 DIAGNOSIS — M542 Cervicalgia: Secondary | ICD-10-CM

## 2022-11-09 DIAGNOSIS — M2550 Pain in unspecified joint: Secondary | ICD-10-CM

## 2022-11-09 DIAGNOSIS — Z13820 Encounter for screening for osteoporosis: Secondary | ICD-10-CM

## 2022-11-09 DIAGNOSIS — M79671 Pain in right foot: Secondary | ICD-10-CM

## 2022-11-09 DIAGNOSIS — M25562 Pain in left knee: Secondary | ICD-10-CM

## 2022-11-09 DIAGNOSIS — M7061 Trochanteric bursitis, right hip: Secondary | ICD-10-CM | POA: Diagnosis not present

## 2022-11-09 DIAGNOSIS — F439 Reaction to severe stress, unspecified: Secondary | ICD-10-CM

## 2022-11-09 DIAGNOSIS — G43809 Other migraine, not intractable, without status migrainosus: Secondary | ICD-10-CM

## 2022-11-09 DIAGNOSIS — M79642 Pain in left hand: Secondary | ICD-10-CM | POA: Diagnosis not present

## 2022-11-09 DIAGNOSIS — R768 Other specified abnormal immunological findings in serum: Secondary | ICD-10-CM

## 2022-11-09 DIAGNOSIS — F3175 Bipolar disorder, in partial remission, most recent episode depressed: Secondary | ICD-10-CM

## 2022-11-09 DIAGNOSIS — M4004 Postural kyphosis, thoracic region: Secondary | ICD-10-CM | POA: Diagnosis not present

## 2022-11-09 DIAGNOSIS — R0989 Other specified symptoms and signs involving the circulatory and respiratory systems: Secondary | ICD-10-CM

## 2022-11-09 DIAGNOSIS — M7062 Trochanteric bursitis, left hip: Secondary | ICD-10-CM

## 2022-11-09 DIAGNOSIS — R5383 Other fatigue: Secondary | ICD-10-CM | POA: Diagnosis not present

## 2022-11-09 DIAGNOSIS — Z8719 Personal history of other diseases of the digestive system: Secondary | ICD-10-CM

## 2022-11-09 DIAGNOSIS — Z780 Asymptomatic menopausal state: Secondary | ICD-10-CM | POA: Diagnosis not present

## 2022-11-09 DIAGNOSIS — E782 Mixed hyperlipidemia: Secondary | ICD-10-CM

## 2022-11-09 DIAGNOSIS — E559 Vitamin D deficiency, unspecified: Secondary | ICD-10-CM

## 2022-11-09 NOTE — Patient Instructions (Signed)
Iliotibial Band Syndrome Rehab Ask your health care provider which exercises are safe for you. Do exercises exactly as told by your health care provider and adjust them as directed. It is normal to feel mild stretching, pulling, tightness, or discomfort as you do these exercises. Stop right away if you feel sudden pain or your pain gets significantly worse. Do not begin these exercises until told by your health care provider. Stretching and range-of-motion exercises These exercises warm up your muscles and joints and improve the movement and flexibility of your hip and pelvis. Quadriceps stretch, prone  Lie on your abdomen (prone position) on a firm surface, such as a bed or padded floor. Bend your left / right knee and reach back to hold your ankle or pant leg. If you cannot reach your ankle or pant leg, loop a belt around your foot and grab the belt instead. Gently pull your heel toward your buttocks. Your knee should not slide out to the side. You should feel a stretch in the front of your thigh and knee (quadriceps). Hold this position for __________ seconds. Repeat __________ times. Complete this exercise __________ times a day. Iliotibial band stretch An iliotibial band is a strong band of muscle tissue that runs from the outer side of your hip to the outer side of your thigh and knee. Lie on your side with your left / right leg in the top position. Bend both of your knees and grab your left / right ankle. Stretch out your bottom arm to help you balance. Slowly bring your top knee back so your thigh goes behind your trunk. Slowly lower your top leg toward the floor until you feel a gentle stretch on the outside of your left / right hip and thigh. If you do not feel a stretch and your knee will not fall farther, place the heel of your other foot on top of your knee and pull your knee down toward the floor with your foot. Hold this position for __________ seconds. Repeat __________ times.  Complete this exercise __________ times a day. Strengthening exercises These exercises build strength and endurance in your hip and pelvis. Endurance is the ability to use your muscles for a long time, even after they get tired. Straight leg raises, side-lying This exercise strengthens the muscles that rotate the leg at the hip and move it away from your body (hip abductors). Lie on your side with your left / right leg in the top position. Lie so your head, shoulder, hip, and knee line up. You may bend your bottom knee to help you balance. Roll your hips slightly forward so your hips are stacked directly over each other and your left / right knee is facing forward. Tense the muscles in your outer thigh and lift your top leg 4-6 inches (10-15 cm). Hold this position for __________ seconds. Slowly lower your leg to return to the starting position. Let your muscles relax completely before doing another repetition. Repeat __________ times. Complete this exercise __________ times a day. Leg raises, prone This exercise strengthens the muscles that move the hips backward (hip extensors). Lie on your abdomen (prone position) on your bed or a firm surface. You can put a pillow under your hips if that is more comfortable for your lower back. Bend your left / right knee so your foot is straight up in the air. Squeeze your buttocks muscles and lift your left / right thigh off the bed. Do not let your back arch. Tense   your thigh muscle as hard as you can without increasing any knee pain. Hold this position for __________ seconds. Slowly lower your leg to return to the starting position and allow it to relax completely. Repeat __________ times. Complete this exercise __________ times a day. Hip hike Stand sideways on a bottom step. Stand on your left / right leg with your other foot unsupported next to the step. You can hold on to a railing or wall for balance if needed. Keep your knees straight and your  torso square. Then lift your left / right hip up toward the ceiling. Slowly let your left / right hip lower toward the floor, past the starting position. Your foot should get closer to the floor. Do not lean or bend your knees. Repeat __________ times. Complete this exercise __________ times a day. This information is not intended to replace advice given to you by your health care provider. Make sure you discuss any questions you have with your health care provider. Document Revised: 11/07/2019 Document Reviewed: 11/07/2019 Elsevier Patient Education  2023 [employer]  

## 2022-11-11 LAB — RHEUMATOID FACTOR: Rheumatoid fact SerPl-aCnc: <14 IU/mL (ref <14)

## 2022-11-11 LAB — CK: Total CK: 57 U/L (ref 29–143)

## 2022-11-11 LAB — PROTEIN / CREATININE RATIO, URINE
Creatinine, Urine: 97 mg/dL (ref 20–275)
Protein/Creat Ratio: 113 mg/g creat (ref 24–184)
Protein/Creatinine Ratio: 0.113 mg/mg creat (ref 0.024–0.184)
Total Protein, Urine: 11 mg/dL (ref 5–24)

## 2022-11-11 LAB — SJOGRENS SYNDROME-A EXTRACTABLE NUCLEAR ANTIBODY: SSA (Ro) (ENA) Antibody, IgG: <1 AI

## 2022-11-11 LAB — ANTI-DNA ANTIBODY, DOUBLE-STRANDED: ds DNA Ab: <1 IU/mL

## 2022-11-11 LAB — C3 AND C4
C3 Complement: 114 mg/dL (ref 83–193)
C4 Complement: 29 mg/dL (ref 15–57)

## 2022-11-11 LAB — TSH: TSH: 0.91 mIU/L (ref 0.40–4.50)

## 2022-11-11 LAB — SJOGRENS SYNDROME-B EXTRACTABLE NUCLEAR ANTIBODY: SSB (La) (ENA) Antibody, IgG: <1 AI

## 2022-11-11 LAB — RNP ANTIBODY: Ribonucleic Protein(ENA) Antibody, IgG: <1 AI

## 2022-11-11 NOTE — Progress Notes (Signed)
All the labs are within normal limits.  We will discuss results at the follow-up visit.

## 2022-11-18 ENCOUNTER — Ambulatory Visit (HOSPITAL_BASED_OUTPATIENT_CLINIC_OR_DEPARTMENT_OTHER)
Admission: RE | Admit: 2022-11-18 | Discharge: 2022-11-18 | Disposition: A | Discharge status: Home / Self Care | Source: Ambulatory Visit | Attending: Rheumatology | Admitting: Rheumatology

## 2022-11-18 DIAGNOSIS — Z780 Asymptomatic menopausal state: Secondary | ICD-10-CM | POA: Diagnosis present

## 2022-11-18 DIAGNOSIS — Z13820 Encounter for screening for osteoporosis: Secondary | ICD-10-CM | POA: Diagnosis not present

## 2022-11-18 NOTE — Progress Notes (Signed)
DEXA scan is normal.  Plan to repeat DEXA scan in 5 years.  Patient should continue with calcium intake, vitamin D and exercise.

## 2022-11-22 ENCOUNTER — Encounter: Payer: Self-pay | Admitting: Internal Medicine

## 2022-11-25 ENCOUNTER — Encounter: Payer: Self-pay | Admitting: Nurse Practitioner

## 2022-11-25 ENCOUNTER — Ambulatory Visit (INDEPENDENT_AMBULATORY_CARE_PROVIDER_SITE_OTHER): Admitting: Nurse Practitioner

## 2022-11-25 VITALS — BP 116/78 | HR 76 | Temp 97.7°F | Ht 65.0 in | Wt 133.4 lb

## 2022-11-25 DIAGNOSIS — S80852A Superficial foreign body, left lower leg, initial encounter: Secondary | ICD-10-CM

## 2022-11-25 DIAGNOSIS — Z1839 Other retained organic fragments: Secondary | ICD-10-CM

## 2022-11-25 MED ORDER — DOXYCYCLINE HYCLATE 100 MG PO TABS: 100.0000 mg | ORAL_TABLET | Freq: Two times a day (BID) | ORAL | 0 refills | Status: DC

## 2022-11-25 NOTE — Progress Notes (Signed)
Assessment and Plan:  AMEILA Hall was seen today for an episodic visit.  Embedded tick of left lower leg, initial encounter Area cleansed with alcohol Sterile tweezers used to removed tick from skin - successful removal of head Identified as Lone Start Tick (picture provided below). Discussed monitoring for s/s of allergic reaction after ingestion of meat - contact office if noticed. Apply triple antibiotic ointment and band-aide. Start Doxycyline as directed for prophylactic treatment of an increase in s/s of infection.   Meds ordered this encounter  Medications   doxycycline (VIBRA-TABS) 100 MG tablet    Sig: Take 1 tablet (100 mg total) by mouth 2 (two) times daily.    Dispense:  6 tablet    Refill:  0    Order Specific Question:   Supervising Provider    Answer:   Unk Pinto [phone]   Notify office for further evaluation and treatment, questions or concerns if s/s fail to improve. The risks and benefits of my recommendations, as well as other treatment options were discussed with the patient today. Questions were answered.  Further disposition pending results of labs. Discussed med's effects and SE's.    Over 20 minutes of exam, counseling, chart review, and critical decision making was performed.   Future Appointments  Date Time Provider [place]  12/21/2022 11:00 AM Bo Merino, MD [facility] None  03/29/2023 11:30 AM Bonnie Jump, NP [facility] None  09/29/2023  9:00 AM Bonnie Jump, NP [facility] None    ------------------------------------------------------------------------------------------------------------------   HPI BP 116/78   Pulse 76   Temp 97.7 F (36.5 C)   Ht '5\' 5"'$  (1.651 m)   Wt 133 lb 6.4 oz (60.5 kg)   LMP 03/12/2012   SpO2 98%   BMI 22.20 kg/m   62 y.o.female presents for  Past Medical History:  Diagnosis Date   Allergy    Hay fever/exercise induced asthma   AMA (advanced maternal age) multigravida 35+     Anxiety    on meds   Arthritis    generalized   Asthma    Blood transfusion 1990's   Depression    bipolar=on meds   GERD (gastroesophageal reflux disease)    on meds   Hyperlipidemia    on meds   Iron deficiency anemia 1990's   negative work up as to etiology; required iron infusion, blood transfusion.  Saw an hematologist  at [facility].    Left-sided Bell's palsy 02/03/2021   Migraine    Newborn product of in vitro fertilization (IVF) pregnancy    Pregnancy induced hypertension    on meds   Preterm labor    Sleep apnea    no CPAP   Thrombocytosis    Vaginal Pap smear, abnormal      No Known Allergies  Current Outpatient Medications on File Prior to Visit  Medication Sig   ALPRAZolam (XANAX) 1 MG tablet Take 1 mg by mouth at bedtime as needed.   ARIPiprazole (ABILIFY) 10 MG tablet Take 10 mg by mouth daily.   aspirin EC 81 MG tablet Take 1 tablet (81 mg total) by mouth daily. (Patient taking differently: Take 81 mg by mouth at bedtime.)   benzonatate (TESSALON PERLES) 100 MG capsule Take 1 capsule (100 mg total) by mouth every 6 (six) hours as needed for cough.   busPIRone (BUSPAR) 10 MG tablet Take 10 mg by mouth daily.   butalbital-acetaminophen-caffeine (FIORICET) 50-325-40 MG tablet TAKE 1 TO 2 TABLETS EVERY 6 HOURS AS NEEDED ONSET OF HEADACHE.  MAX 6 TABS PER EVENT. MAX 30 A MONTH   CLONAZEPAM PO Take 2 mg by mouth daily at 6 (six) AM.   DimenhyDRINATE (MOTION SICKNESS RELIEF PO) Take 1-2 tablets by mouth at bedtime as needed (motion sickness).   esomeprazole (NEXIUM) 40 MG capsule Take 40 mg by mouth daily at 6 (six) AM.   lamoTRIgine (LAMICTAL) 200 MG tablet Take 200 mg by mouth at bedtime.   Lurasidone HCl 60 MG TABS Take 60 mg by mouth at bedtime.   montelukast (SINGULAIR) 10 MG tablet TAKE ONE TABLET BY MOUTH EVERY DAY AT BEDTIME   promethazine (PHENERGAN) 25 MG tablet Take 1 tablet (25 mg total) by mouth every 6 (six) hours as needed for nausea or vomiting (can cause  fatigue). Max: 4 tablets per day   promethazine-dextromethorphan (PROMETHAZINE-DM) 6.25-15 MG/5ML syrup Take 5 mLs by mouth 4 (four) times daily as needed for cough.   SODIUM FLUORIDE 5000 PPM 1.1 % PSTE Take 1 Application by mouth in the morning and at bedtime.   traZODone (DESYREL) 100 MG tablet Take 1.5 tablets by mouth at bedtime.   vortioxetine HBr (TRINTELLIX) 20 MG TABS tablet Take 20 mg by mouth daily.   Current Facility-Administered Medications on File Prior to Visit  Medication   0.9 %  sodium chloride infusion    ROS: all negative except what is noted in the HPI.   Physical Exam:  BP 116/78   Pulse 76   Temp 97.7 F (36.5 C)   Ht '5\' 5"'$  (1.651 m)   Wt 133 lb 6.4 oz (60.5 kg)   LMP 03/12/2012   SpO2 98%   BMI 22.20 kg/m   General Appearance: NAD.  Awake, conversant and cooperative. Eyes: PERRLA, EOMs intact.  Sclera white.  Conjunctiva without erythema. Sinuses: No frontal/maxillary tenderness.  No nasal discharge. Nares patent.  ENT/Mouth: Ext aud canals clear.  Bilateral TMs w/DOL and without erythema or bulging. Hearing intact.  Posterior pharynx without swelling or exudate.  Tonsils without swelling or erythema.  Neck: Supple.  No masses, nodules or thyromegaly. Respiratory: Effort is regular with non-labored breathing. Breath sounds are equal bilaterally without rales, rhonchi, wheezing or stridor.  Cardio: RRR with no MRGs. Brisk peripheral pulses without edema.  Abdomen: Active BS in all four quadrants.  Soft and non-tender without guarding, rebound tenderness, hernias or masses. Lymphatics: Non tender without lymphadenopathy.  Musculoskeletal: Full ROM, 5/5 strength, normal ambulation.  No clubbing or cyanosis. Skin: LLE with imbedded tick.  Surrounding area WNL.  Appropriate color for ethnicity. Warm without erythema rashes, lesions, ecchymosis, ulcers.  Neuro: CN II-XII grossly normal. Normal muscle tone without cerebellar symptoms and intact sensation.    Psych: AO X 3,  appropriate mood and affect, insight and judgment.      Bonnie Jump, NP 9:55 AM [facility]

## 2022-11-25 NOTE — Patient Instructions (Signed)
Alpha-gal Syndrome Alpha-gal syndrome (AGS) is an allergic reaction to a type of sugar commonly called alpha-gal. It is found in the meat and organ meats of mammals, such as cows, pigs, and sheep. It may also be found in products that come from animals, such as gelatin, medicines, medicine capsules, some milk products, vaccines, and cosmetics. AGS causes an allergic reaction that can be immediate or delayed for several hours and can range from mild to severe. A mild reaction may cause nausea, vomiting, or an itchy rash (hives). A severe reaction can cause breathing difficulties or loss of consciousness (anaphylaxis). This can be life-threatening. What are the causes? This allergy is first triggered by a tick bite from a lone star or blackleg tick. These ticks bite animals, such as cows, pigs, or sheep, and pick up the alpha-gal sugar from their blood. If the same tick bites you, it may cause your body's defense system (immune system) to produce antibodies to alpha-gal and cause the allergic reaction. What increases the risk? People who live in areas of the [place] where the lone star tick is common are at highest risk, these areas may include: [place]. [place]. [place] into parts of [place]. People who are hunters and people who work in jobs that involve caring for trees (foresters) have an increased risk of this condition. What are the signs or symptoms? AGS may not cause an allergic reaction every time you eat red meat or come into contact with alpha-gal. If you do have a reaction, symptoms may include: Hives. Severe stomachache. Nausea or vomiting. Swelling of the lips, face, tongue, or throat. Making a high-pitched whistling sound when you breathe, most often when you breathe out (wheezing). Sneezing and runny nose. Headache. Symptoms of anaphylaxis may include: Difficulty breathing. Difficulty swallowing. Dizziness. Fainting. This may happen due to a sudden drop in  blood pressure. You may have AGS if you had anaphylaxis after eating something but do not have any known food allergies. Unlike other food allergies, the reaction does not start soon after the exposure to alpha-gal. There may be a delay of several hours. How is this diagnosed? This condition may be diagnosed based on signs and symptoms of the condition, especially if you have a history of tick bites and a delayed reaction to red meat. You may also have a blood test to check for antibodies to alpha-gal or a skin test to see if there is a reaction to alpha-gal. How is this treated? This condition may be treated by: Avoiding meat and organ meats that may contain alpha-gal. Avoiding medicines or other products that may contain alpha-gal. Using medicines to reduce an allergic reaction. Carrying an epinephrine auto-injector to use in case of a severe AGS reaction. AGS should be treated by an allergist or a health care provider who has experience with AGS. Follow these instructions at home:  Medicines Take over-the-counter and prescription medicines only as told by your health care provider. Follow instructions from your health care provider about when and how to use an epinephrine auto-injector. General instructions Avoid red meat and organ meat, and check food labels for meat-based ingredients in packaged foods such as soup, gravy, and flavoring. Work with your allergist to find what other foods or products you may need to avoid, some people may benefit from avoiding dairy. Keep all follow-up visits. This is important. How is this prevented? Take steps to prevent tick bites as frequent tick bites may increase the risk of an AGS reaction. These steps   include: Avoiding woods and fields with high grass. Wearing clothing protected with the anti-tick chemical permethrin when outdoors in these areas or using a U.S. Environmental Protection Agency-approved insect repellent. Checking your clothing for  ticks before you come back indoors. Checking your body for ticks when you take a shower. Checking your pets for ticks. Where to find more information Centers for Disease Control and Prevention: www.cdc.gov National Institute of Allergy and Infectious Diseases: www.niaid.nih.gov Contact a health care provider if: You have any signs or symptoms of AGS food allergy. You have a tick bite that causes a skin reaction. Get help right away if: You have a severe AGS reaction. You have trouble swallowing or breathing. These symptoms may represent a serious problem that is an emergency. Do not wait to see if the symptoms will go away. Get medical help right away. Call your local emergency services (911 in the [place] Do not drive yourself to the hospital. Summary AGS is a food allergy caused by a tick bite. Red meats, organ meats, and other products or medicines may trigger the alpha-gal reaction. AGS reactions can be mild or severe. If you have AGS, you should not eat red meat, organ meat, or products that may contain alpha-gal. Avoiding tick bites prevents AGS and more serious AGS reactions. This information is not intended to replace advice given to you by your health care provider. Make sure you discuss any questions you have with your health care provider. Document Revised: 12/16/2020 Document Reviewed: 12/16/2020 Elsevier Patient Education  2023 Elsevier Inc.  

## 2022-12-03 DIAGNOSIS — G245 Blepharospasm: Secondary | ICD-10-CM | POA: Diagnosis not present

## 2022-12-11 NOTE — Progress Notes (Signed)
Office Visit Note  Patient: Bonnie Hall             Date of Birth: 12/05/1960           MRN: [id number]             PCP: Lucky Cowboy, MD Referring: Lucky Cowboy, MD Visit Date: 12/21/2022 Occupation: @GUAROCC @  Subjective:  Pain in multiple joints  History of Present Illness: Bonnie Hall is a 62 y.o. female with history of joint pain and positive ANA.  She states she continues to have pain and discomfort in her bilateral hands, bilateral hips, knee joints and her feet.  She denies any history of oral ulcers, nasal ulcers, malar rash, photosensitivity, Raynaud's or lymphadenopathy.    Activities of Daily Living:  Patient reports morning stiffness for 10-15 minutes.   Patient Reports nocturnal pain.  Difficulty dressing/grooming: Denies Difficulty climbing stairs: Denies Difficulty getting out of chair: Denies Difficulty using hands for taps, buttons, cutlery, and/or writing: Denies  Review of Systems  Constitutional:  Positive for fatigue.  HENT:  Positive for mouth dryness. Negative for mouth sores.   Eyes:  Negative for dryness.  Respiratory:  Negative for shortness of breath.   Cardiovascular:  Negative for chest pain and palpitations.  Gastrointestinal:  Negative for blood in stool, constipation and diarrhea.  Endocrine: Negative for increased urination.  Genitourinary:  Negative for involuntary urination.  Musculoskeletal:  Positive for joint pain, joint pain and morning stiffness. Negative for gait problem, joint swelling, myalgias, muscle weakness, muscle tenderness and myalgias.  Skin:  Negative for color change, rash, hair loss and sensitivity to sunlight.  Allergic/Immunologic: Negative for susceptible to infections.  Neurological:  Positive for dizziness. Negative for headaches.  Hematological:  Negative for swollen glands.  Psychiatric/Behavioral:  Positive for depressed mood and sleep disturbance. The patient is nervous/anxious.      PMFS History:  Patient Active Problem List   Diagnosis Date Noted   Family history of Alzheimer's disease 04/14/2021   Aortic atherosclerosis (HCC) - per CT 01/30/2020 01/30/2020   Stress at home 10/01/2019   Caloric malnutrition 10/01/2019   Mixed hyperlipidemia 07/31/2019   S/P cesarean section 03/09/2017   Abnormal glucose 01/16/2015   Vitamin D deficiency 01/14/2014   Medication management 01/14/2014   Labile hypertension 01/14/2014   Allergy    Sleep apnea    Migraine    Bipolar disorder (manic depression)    GERD (gastroesophageal reflux disease)     Past Medical History:  Diagnosis Date   Allergy    Hay fever/exercise induced asthma   AMA (advanced maternal age) multigravida 35+    Anxiety    on meds   Arthritis    generalized   Asthma    Blood transfusion 1990's   Depression    bipolar=on meds   GERD (gastroesophageal reflux disease)    on meds   Hyperlipidemia    on meds   Iron deficiency anemia 1990's   negative work up as to etiology; required iron infusion, blood transfusion.  Saw an hematologist  at [facility]    Left-sided Bell's palsy 02/03/2021   Migraine    Newborn product of in vitro fertilization (IVF) pregnancy    Pregnancy induced hypertension    on meds   Preterm labor    Sleep apnea    no CPAP   Thrombocytosis    Vaginal Pap smear, abnormal     Family History  Problem Relation Age of Onset   Alzheimer's disease  Mother 21   Prostate cancer Father    Arrhythmia Father    Colon cancer Paternal Uncle 70   Colon cancer Paternal Uncle 38   Stroke Maternal Grandmother    Stroke Maternal Grandfather    Rheum arthritis Paternal Grandmother    Hearing loss Daughter    Strabismus Daughter    Asperger's syndrome Daughter    ADD / ADHD Daughter    Intellectual disability Daughter    Polycystic ovary syndrome Daughter 33   Bipolar disorder Son    Colon polyps Neg Hx    Esophageal cancer Neg Hx    Stomach cancer Neg Hx    Rectal cancer Neg  Hx    Past Surgical History:  Procedure Laterality Date   CESAREAN SECTION  2009   CESAREAN SECTION N/A 03/09/2017   Procedure: REPEAT CESAREAN SECTION;  Surgeon: Marcelle Overlie, MD;  Location: [facility];  Service: Obstetrics;  Laterality: N/A;   COLONOSCOPY  06/2012   Dr.Kaplan-MAC-movi(exc)-normal-10 yr recall   GYNECOLOGIC CRYOSURGERY     NASAL SEPTUM SURGERY  1983   TONSILLECTOMY  1966   Social History   Social History Narrative   Right handed   Drinks caffeine   Two story hom   Immunization History  Administered Date(s) Administered   Influenza Inj Mdck Quad With Preservative 07/13/2018   Influenza Split 07/10/2013, 07/04/2014, 06/17/2015   Influenza,inj,Quad PF,6+ Mos 09/28/2022   Influenza-Unspecified 08/01/2021   PFIZER(Purple Top)SARS-COV-2 Vaccination 12/07/2019, 12/22/2019, 03/30/2021   PPD Test 01/14/2014, 01/16/2015   Pneumococcal Polysaccharide-23 12/31/2011   Tdap 12/28/2010, 09/13/2016     Objective: Vital Signs: BP 111/73 (BP Location: Left Arm, Patient Position: Sitting, Cuff Size: Normal)   Pulse 81   Resp 16   Ht  (1.676 m)   Wt 134 lb 12.8 oz (61.1 kg)   LMP 03/12/2012   BMI 21.76 kg/m    Physical Exam Vitals and nursing note reviewed.  Constitutional:      Appearance: She is well-developed.  HENT:     Head: Normocephalic and atraumatic.  Eyes:     Conjunctiva/sclera: Conjunctivae normal.  Cardiovascular:     Rate and Rhythm: Normal rate and regular rhythm.     Heart sounds: Normal heart sounds.  Pulmonary:     Effort: Pulmonary effort is normal.     Breath sounds: Normal breath sounds.  Abdominal:     General: Bowel sounds are normal.     Palpations: Abdomen is soft.  Musculoskeletal:     Cervical back: Normal range of motion.  Lymphadenopathy:     Cervical: No cervical adenopathy.  Skin:    General: Skin is warm and dry.     Capillary Refill: Capillary refill takes less than 2 seconds.  Neurological:     Mental  Status: She is alert and oriented to person, place, and time.  Psychiatric:        Behavior: Behavior normal.      Musculoskeletal Exam: She had full range of motion of the cervical spine with discomfort.  Thoracic kyphosis was noted.  She had discomfort with range of motion of the lumbar spine.  There was no point tenderness.  Shoulders, elbows, wrist joints, MCPs PIPs and DIPs been good range of motion.  She had bilateral PIP and DIP thickening with no synovitis.  Hip joints and knee joints were in good range of motion.  She had no tenderness over ankles or MTPs.  CDAI Exam: CDAI Score: -- Patient Global: --; Provider Global: --  Swollen: --; Tender: -- Joint Exam 12/21/2022   No joint exam has been documented for this visit   There is currently no information documented on the homunculus. Go to the Rheumatology activity and complete the homunculus joint exam.  Investigation: No additional findings.  Imaging: No results found.  Recent Labs: Lab Results  Component Value Date   WBC 5.8 09/28/2022   HGB 13.6 09/28/2022   PLT 412 (H) 09/28/2022   NA 139 09/28/2022   K 4.4 09/28/2022   CL 104 09/28/2022   CO2 28 09/28/2022   GLUCOSE 98 09/28/2022   BUN 19 09/28/2022   CREATININE 0.84 09/28/2022   BILITOT 0.3 09/28/2022   ALKPHOS 53 01/28/2021   AST 8 (L) 09/28/2022   ALT 10 09/28/2022   PROT 6.7 09/28/2022   ALBUMIN 4.5 01/28/2021   CALCIUM 8.9 09/28/2022   GFRAA 105 09/09/2020     03/10/22: CRP<0.2, ESR 2, ANA, SM-, ANA 1:80 nuclear, dense fine speckled, anti-CCP<16.   Speciality Comments: No specialty comments available.  Procedures:  No procedures performed Allergies: Patient has no known allergies.   Assessment / Plan:     Visit Diagnoses: Polyarthralgia-she complains of pain and discomfort in multiple joints.  She complains of discomfort in her bilateral hands, knee joints, feet, neck and lower back.  No synovitis was noted on the examination.  Primary  osteoarthritis of both hands-she had bilateral PIP and DIP thickening with no synovitis.  X-rays obtained at the last visit were reviewed which were consistent with osteoarthritis.  Joint protection muscle strengthening was discussed.  Chronic pain of both knees-she complains of pain and discomfort in the bilateral knee joints.  No warmth swelling or effusion was noted.  X-rays of the bilateral knee joints obtained at the last visit were unremarkable.  Primary osteoarthritis of both feet-she complains of discomfort in her bilateral feet.  PIP and DIP prominence was noted.  No synovitis was noted.  Proper fitting shoes were advised.  DDD (degenerative disc disease), cervical-x-rays obtained at the last visit showed multilevel spondylosis and facet joint arthropathy.  I offered physical therapy which she declined.  A handout on cervical spine exercises was given.  Exercises were demonstrated in the office.  DDD (degenerative disc disease), lumbar-she continues to have discomfort in the lumbar region.  She had limited range of motion.  X-ray findings were reviewed with the patient.  X-rays showed multilevel spondylosis at L4-L5 with listhesis and facet joint arthropathy.  Patient declined physical therapy.  A handout on back exercises was given.  Positive ANA (antinuclear antibody)-she has low titer positive ANA.  ENA panel was negative and complements were normal.  Sed rate was normal. November 10, 2022 protein creatinine ratio normal, C3-C4 normal, double-stranded DNA negative, RNP negative, SSA negative, SSB negative, RF negative, CK 57, TSH normal Lab results were discussed with the patient.  Osteoporosis screening - November 18, 2022 T-score -0.8, BMD 0.923 left femoral neck.  Calcium rich diet and exercise was emphasized.  Other medical problems listed as follows.  Vitamin D deficiency  Labile hypertension-blood pressure was normal at 117/73 today.  Aortic atherosclerosis (HCC) - per CT  01/30/2020  Mixed hyperlipidemia  History of gastroesophageal reflux (GERD)  Bipolar disorder, in partial remission, most recent episode depressed  Other migraine without status migrainosus, not intractable  Stress at home  Orders: No orders of the defined types were placed in this encounter.  No orders of the defined types were placed in this encounter.  Follow-Up Instructions: Return if symptoms worsen or fail to improve, for Osteoarthritis.   Bo Merino, MD  Note - This record has been created using Editor, commissioning.  Chart creation errors have been sought, but may not always  have been located. Such creation errors do not reflect on  the standard of medical care.

## 2022-12-21 ENCOUNTER — Encounter: Payer: Self-pay | Admitting: Rheumatology

## 2022-12-21 ENCOUNTER — Ambulatory Visit: Attending: Rheumatology | Admitting: Rheumatology

## 2022-12-21 VITALS — BP 111/73 | HR 81 | Resp 16 | Ht 66.0 in | Wt 134.8 lb

## 2022-12-21 DIAGNOSIS — M19041 Primary osteoarthritis, right hand: Secondary | ICD-10-CM | POA: Diagnosis not present

## 2022-12-21 DIAGNOSIS — G8929 Other chronic pain: Secondary | ICD-10-CM

## 2022-12-21 DIAGNOSIS — Z13820 Encounter for screening for osteoporosis: Secondary | ICD-10-CM | POA: Diagnosis not present

## 2022-12-21 DIAGNOSIS — M19071 Primary osteoarthritis, right ankle and foot: Secondary | ICD-10-CM | POA: Diagnosis not present

## 2022-12-21 DIAGNOSIS — R768 Other specified abnormal immunological findings in serum: Secondary | ICD-10-CM | POA: Diagnosis not present

## 2022-12-21 DIAGNOSIS — M19042 Primary osteoarthritis, left hand: Secondary | ICD-10-CM

## 2022-12-21 DIAGNOSIS — M25561 Pain in right knee: Secondary | ICD-10-CM | POA: Diagnosis not present

## 2022-12-21 DIAGNOSIS — R0989 Other specified symptoms and signs involving the circulatory and respiratory systems: Secondary | ICD-10-CM

## 2022-12-21 DIAGNOSIS — M19072 Primary osteoarthritis, left ankle and foot: Secondary | ICD-10-CM

## 2022-12-21 DIAGNOSIS — F3175 Bipolar disorder, in partial remission, most recent episode depressed: Secondary | ICD-10-CM

## 2022-12-21 DIAGNOSIS — E782 Mixed hyperlipidemia: Secondary | ICD-10-CM | POA: Diagnosis not present

## 2022-12-21 DIAGNOSIS — M2550 Pain in unspecified joint: Secondary | ICD-10-CM

## 2022-12-21 DIAGNOSIS — M5030 Other cervical disc degeneration, unspecified cervical region: Secondary | ICD-10-CM | POA: Diagnosis not present

## 2022-12-21 DIAGNOSIS — M5136 Other intervertebral disc degeneration, lumbar region: Secondary | ICD-10-CM

## 2022-12-21 DIAGNOSIS — E559 Vitamin D deficiency, unspecified: Secondary | ICD-10-CM

## 2022-12-21 DIAGNOSIS — F439 Reaction to severe stress, unspecified: Secondary | ICD-10-CM

## 2022-12-21 DIAGNOSIS — G43809 Other migraine, not intractable, without status migrainosus: Secondary | ICD-10-CM

## 2022-12-21 DIAGNOSIS — Z8719 Personal history of other diseases of the digestive system: Secondary | ICD-10-CM

## 2022-12-21 DIAGNOSIS — I700 Atherosclerosis of aorta: Secondary | ICD-10-CM

## 2022-12-21 DIAGNOSIS — M25562 Pain in left knee: Secondary | ICD-10-CM

## 2022-12-21 DIAGNOSIS — M51369 Other intervertebral disc degeneration, lumbar region without mention of lumbar back pain or lower extremity pain: Secondary | ICD-10-CM

## 2022-12-21 NOTE — Patient Instructions (Signed)
Cervical Strain and Sprain Rehab Ask your health care provider which exercises are safe for you. Do exercises exactly as told by your health care provider and adjust them as directed. It is normal to feel mild stretching, pulling, tightness, or discomfort as you do these exercises. Stop right away if you feel sudden pain or your pain gets worse. Do not begin these exercises until told by your health care provider. Stretching and range-of-motion exercises Cervical side bending  Using good posture, sit on a stable chair or stand up. Without moving your shoulders, slowly tilt your left / right ear to your shoulder until you feel a stretch in the neck muscles on the opposite side. You should be looking straight ahead. Hold for __________ seconds. Repeat with the other side of your neck. Repeat __________ times. Complete this exercise __________ times a day. Cervical rotation  Using good posture, sit on a stable chair or stand up. Slowly turn your head to the side as if you are looking over your left / right shoulder. Keep your eyes level with the ground. Stop when you feel a stretch along the side and the back of your neck. Hold for __________ seconds. Repeat this by turning to your other side. Repeat __________ times. Complete this exercise __________ times a day. Thoracic extension and pectoral stretch  Roll a towel or a small blanket so it is about 4 inches (10 cm) in diameter. Lie down on your back on a firm surface. Put the towel in the middle of your back across your spine. It should not be under your shoulder blades. Put your hands behind your head and let your elbows fall out to your sides. Hold for __________ seconds. Repeat __________ times. Complete this exercise __________ times a day. Strengthening exercises Upper cervical flexion  Lie on your back with a thin pillow behind your head or a small, rolled-up towel under your neck. Gently tuck your chin toward your chest and nod  your head down to look toward your feet. Do not lift your head off the pillow. Hold for __________ seconds. Release the tension slowly. Relax your neck muscles completely before you repeat this exercise. Repeat __________ times. Complete this exercise __________ times a day. Cervical extension  Stand about 6 inches (15 cm) away from a wall, with your back facing the wall. Place a soft object, about 6-8 inches (15-20 cm) in diameter, between the back of your head and the wall. A soft object could be a small pillow, a ball, or a folded towel. Gently tilt your head back and press into the soft object. Keep your jaw and forehead relaxed. Hold for __________ seconds. Release the tension slowly. Relax your neck muscles completely before you repeat this exercise. Repeat __________ times. Complete this exercise __________ times a day. Posture and body mechanics Body mechanics refer to the movements and positions of your body while you do your daily activities. Posture is part of body mechanics. Good posture and healthy body mechanics can help to relieve stress in your body's tissues and joints. Good posture means that your spine is in its natural S-curve position (your spine is neutral), your shoulders are pulled back slightly, and your head is not tipped forward. The following are general guidelines for using improved posture and body mechanics in your everyday activities. Sitting  When sitting, keep your spine neutral and keep your feet flat on the floor. Use a footrest, if needed, and keep your thighs parallel to the floor. Avoid rounding  your shoulders. Avoid tilting your head forward. When working at a desk or a computer, keep your desk at a height where your hands are slightly lower than your elbows. Slide your chair under your desk so you are close enough to maintain good posture. When working at a computer, place your monitor at a height where you are looking straight ahead and you do not have to  tilt your head forward or downward to look at the screen. Standing  When standing, keep your spine neutral and keep your feet about hip-width apart. Keep a slight bend in your knees. Your ears, shoulders, and hips should line up. When you do a task in which you stand in one place for a long time, place one foot up on a stable object that is 2-4 inches (5-10 cm) high, such as a footstool. This helps keep your spine neutral. Resting When lying down and resting, avoid positions that are most painful for you. Try to support your neck in a neutral position. You can use a contour pillow or a small rolled-up towel. Your pillow should support your neck but not push on it. This information is not intended to replace advice given to you by your health care provider. Make sure you discuss any questions you have with your health care provider. Document Revised: 03/22/2022 Document Reviewed: 03/22/2022 [employer] Patient Education  2023 [employer] Back Exercises The following exercises strengthen the muscles that help to support the trunk (torso) and back. They also help to keep the lower back flexible. Doing these exercises can help to prevent or lessen existing low back pain. If you have back pain or discomfort, try doing these exercises 2-3 times each day or as told by your health care provider. As your pain improves, do them once each day, but increase the number of times that you repeat the steps for each exercise (do more repetitions). To prevent the recurrence of back pain, continue to do these exercises once each day or as told by your health care provider. Do exercises exactly as told by your health care provider and adjust them as directed. It is normal to feel mild stretching, pulling, tightness, or discomfort as you do these exercises, but you should stop right away if you feel sudden pain or your pain gets worse. Exercises Single knee to chest Repeat these steps 3-5 times for each leg: Lie on  your back on a firm bed or the floor with your legs extended. Bring one knee to your chest. Your other leg should stay extended and in contact with the floor. Hold your knee in place by grabbing your knee or thigh with both hands and hold. Pull on your knee until you feel a gentle stretch in your lower back or buttocks. Hold the stretch for 10-30 seconds. Slowly release and straighten your leg.  Pelvic tilt Repeat these steps 5-10 times: Lie on your back on a firm bed or the floor with your legs extended. Bend your knees so they are pointing toward the ceiling and your feet are flat on the floor. Tighten your lower abdominal muscles to press your lower back against the floor. This motion will tilt your pelvis so your tailbone points up toward the ceiling instead of pointing to your feet or the floor. With gentle tension and even breathing, hold this position for 5-10 seconds.  Cat-cow Repeat these steps until your lower back becomes more flexible: Get into a hands-and-knees position on a firm bed or the floor.   Keep your hands under your shoulders, and keep your knees under your hips. You may place padding under your knees for comfort. Let your head hang down toward your chest. Contract your abdominal muscles and point your tailbone toward the floor so your lower back becomes rounded like the back of a cat. Hold this position for 5 seconds. Slowly lift your head, let your abdominal muscles relax, and point your tailbone up toward the ceiling so your back forms a sagging arch like the back of a cow. Hold this position for 5 seconds.  Press-ups Repeat these steps 5-10 times: Lie on your abdomen (face-down) on a firm bed or the floor. Place your palms near your head, about shoulder-width apart. Keeping your back as relaxed as possible and keeping your hips on the floor, slowly straighten your arms to raise the top half of your body and lift your shoulders. Do not use your back muscles to raise  your upper torso. You may adjust the placement of your hands to make yourself more comfortable. Hold this position for 5 seconds while you keep your back relaxed. Slowly return to lying flat on the floor.  Bridges Repeat these steps 10 times: Lie on your back on a firm bed or the floor. Bend your knees so they are pointing toward the ceiling and your feet are flat on the floor. Your arms should be flat at your sides, next to your body. Tighten your buttocks muscles and lift your buttocks off the floor until your waist is at almost the same height as your knees. You should feel the muscles working in your buttocks and the back of your thighs. If you do not feel these muscles, slide your feet 1-2 inches (2.5-5 cm) farther away from your buttocks. Hold this position for 3-5 seconds. Slowly lower your hips to the starting position, and allow your buttocks muscles to relax completely. If this exercise is too easy, try doing it with your arms crossed over your chest. Abdominal crunches Repeat these steps 5-10 times: Lie on your back on a firm bed or the floor with your legs extended. Bend your knees so they are pointing toward the ceiling and your feet are flat on the floor. Cross your arms over your chest. Tip your chin slightly toward your chest without bending your neck. Tighten your abdominal muscles and slowly raise your torso high enough to lift your shoulder blades a tiny bit off the floor. Avoid raising your torso higher than that because it can put too much stress on your lower back and does not help to strengthen your abdominal muscles. Slowly return to your starting position.  Back lifts Repeat these steps 5-10 times: Lie on your abdomen (face-down) with your arms at your sides, and rest your forehead on the floor. Tighten the muscles in your legs and your buttocks. Slowly lift your chest off the floor while you keep your hips pressed to the floor. Keep the back of your head in line  with the curve in your back. Your eyes should be looking at the floor. Hold this position for 3-5 seconds. Slowly return to your starting position.  Contact a health care provider if: Your back pain or discomfort gets much worse when you do an exercise. Your worsening back pain or discomfort does not lessen within 2 hours after you exercise. If you have any of these problems, stop doing these exercises right away. Do not do them again unless your health care provider says that you  can. Get help right away if: You develop sudden, severe back pain. If this happens, stop doing the exercises right away. Do not do them again unless your health care provider says that you can. This information is not intended to replace advice given to you by your health care provider. Make sure you discuss any questions you have with your health care provider. Document Revised: 02/24/2021 Document Reviewed: 11/12/2020 [employer] Patient Education  [place]. Hand Exercises Hand exercises can be helpful for almost anyone. These exercises can strengthen the hands, improve flexibility and movement, and increase blood flow to the hands. These results can make work and daily tasks easier. Hand exercises can be especially helpful for people who have joint pain from arthritis or have nerve damage from overuse (carpal tunnel syndrome). These exercises can also help people who have injured a hand. Exercises Most of these hand exercises are gentle stretching and motion exercises. It is usually safe to do them often throughout the day. Warming up your hands before exercise may help to reduce stiffness. You can do this with gentle massage or by placing your hands in warm water for 10-15 minutes. It is normal to feel some stretching, pulling, tightness, or mild discomfort as you begin new exercises. This will gradually improve. Stop an exercise right away if you feel sudden, severe pain or your pain gets worse. Ask your  health care provider which exercises are best for you. Knuckle bend or "claw" fist  Stand or sit with your arm, hand, and all five fingers pointed straight up. Make sure to keep your wrist straight during the exercise. Gently bend your fingers down toward your palm until the tips of your fingers are touching the top of your palm. Keep your big knuckle straight and just bend the small knuckles in your fingers. Hold this position for __________ seconds. Straighten (extend) your fingers back to the starting position. Repeat this exercise 5-10 times with each hand. Full finger fist  Stand or sit with your arm, hand, and all five fingers pointed straight up. Make sure to keep your wrist straight during the exercise. Gently bend your fingers into your palm until the tips of your fingers are touching the middle of your palm. Hold this position for __________ seconds. Extend your fingers back to the starting position, stretching every joint fully. Repeat this exercise 5-10 times with each hand. Straight fist Stand or sit with your arm, hand, and all five fingers pointed straight up. Make sure to keep your wrist straight during the exercise. Gently bend your fingers at the big knuckle, where your fingers meet your hand, and the middle knuckle. Keep the knuckle at the tips of your fingers straight and try to touch the bottom of your palm. Hold this position for __________ seconds. Extend your fingers back to the starting position, stretching every joint fully. Repeat this exercise 5-10 times with each hand. Tabletop  Stand or sit with your arm, hand, and all five fingers pointed straight up. Make sure to keep your wrist straight during the exercise. Gently bend your fingers at the big knuckle, where your fingers meet your hand, as far down as you can while keeping the small knuckles in your fingers straight. Think of forming a tabletop with your fingers. Hold this position for __________  seconds. Extend your fingers back to the starting position, stretching every joint fully. Repeat this exercise 5-10 times with each hand. Finger spread  Place your hand flat on a table with  your palm facing down. Make sure your wrist stays straight as you do this exercise. Spread your fingers and thumb apart from each other as far as you can until you feel a gentle stretch. Hold this position for __________ seconds. Bring your fingers and thumb tight together again. Hold this position for __________ seconds. Repeat this exercise 5-10 times with each hand. Making circles  Stand or sit with your arm, hand, and all five fingers pointed straight up. Make sure to keep your wrist straight during the exercise. Make a circle by touching the tip of your thumb to the tip of your index finger. Hold for __________ seconds. Then open your hand wide. Repeat this motion with your thumb and each finger on your hand. Repeat this exercise 5-10 times with each hand. Thumb motion  Sit with your forearm resting on a table and your wrist straight. Your thumb should be facing up toward the ceiling. Keep your fingers relaxed as you move your thumb. Lift your thumb up as high as you can toward the ceiling. Hold for __________ seconds. Bend your thumb across your palm as far as you can, reaching the tip of your thumb for the small finger (pinkie) side of your palm. Hold for __________ seconds. Repeat this exercise 5-10 times with each hand. Grip strengthening  Hold a stress ball or other soft ball in the middle of your hand. Slowly increase the pressure, squeezing the ball as much as you can without causing pain. Think of bringing the tips of your fingers into the middle of your palm. All of your finger joints should bend when doing this exercise. Hold your squeeze for __________ seconds, then relax. Repeat this exercise 5-10 times with each hand. Contact a health care provider if: Your hand pain or discomfort  gets much worse when you do an exercise. Your hand pain or discomfort does not improve within 2 hours after you exercise. If you have any of these problems, stop doing these exercises right away. Do not do them again unless your health care provider says that you can. Get help right away if: You develop sudden, severe hand pain or swelling. If this happens, stop doing these exercises right away. Do not do them again unless your health care provider says that you can. This information is not intended to replace advice given to you by your health care provider. Make sure you discuss any questions you have with your health care provider. Document Revised: 12/11/2020 Document Reviewed: 12/18/2020 Else[employer] Patient Education  2023 [employer] Exercises for Chronic Knee Pain Chronic knee pain is pain that lasts longer than 3 months. For most people with chronic knee pain, exercise and weight loss is an important part of treatment. Your health care provider may want you to focus on: Strengthening the muscles that support your knee. This can take pressure off your knee and lessen pain. Preventing knee stiffness. Maintaining or increasing how far you can move your knee. Losing weight (if this applies) to take pressure off your knee, decrease your risk for injury, and make it easier for you to exercise. Your health care provider will help you develop an exercise program that matches your needs and physical abilities. Below are simple, low-impact exercises you can do at home. Ask your health care provider or a physical therapist how often you should do your exercise program and how many times to repeat each exercise. General safety tips Follow these safety tips for exercising with chronic knee pain: Get your  health care provider's approval before doing any exercises. Start slowly and stop any time an exercise causes pain. Do not exercise if your knee pain is flaring up. Warm up first. Stretching a cold  muscle can cause an injury. Do 5-10 minutes of easy movement or light stretching before beginning your exercise routine. Do 5-10 minutes of low-impact activity (like walking or cycling) before starting strengthening exercises. Contact your health care provider any time you have pain during or after exercising. Exercise may cause discomfort but should not be painful. It is normal to be a little stiff or sore after exercising.  Stretching and range-of-motion exercises Front thigh stretch  Stand up straight and support your body by holding on to a chair or resting one hand on a wall. With your legs straight and close together, bend one knee to lift your heel up toward your buttocks. Using one hand for support, grab your ankle with your free hand. Pull your foot up closer toward your buttocks to feel the stretch in front of your thigh. Hold the stretch for 30 seconds. Repeat __________ times. Complete this exercise __________ times a day. Back thigh stretch  Sit on the floor with your back straight and your legs out straight in front of you. Place the palms of your hands on the floor and slide them toward your feet as you bend at the hip. Try to touch your nose to your knees and feel the stretch in the back of your thighs. Hold for 30 seconds. Repeat __________ times. Complete this exercise __________ times a day. Calf stretch  Stand facing a wall. Place the palms of your hands flat against the wall, arms extended, and lean slightly against the wall. Get into a lunge position with one leg bent at the knee and the other leg stretched out straight behind you. Keep both feet facing the wall and increase the bend in your knee while keeping the heel of the other leg flat on the ground. You should feel the stretch in your calf. Hold for 30 seconds. Repeat __________ times. Complete this exercise __________ times a day. Strengthening exercises Straight leg lift Lie on your back with one knee bent  and the other leg out straight. Slowly lift the straight leg without bending the knee. Lift until your foot is about 12 inches (30 cm) off the floor. Hold for 3-5 seconds and slowly lower your leg. Repeat __________ times. Complete this exercise __________ times a day. Single leg dip Stand between two chairs and put both hands on the backs of the chairs for support. Extend one leg out straight with your body weight resting on the heel of the standing leg. Slowly bend your standing knee to dip your body to the level that is comfortable for you. Hold for 3-5 seconds. Repeat __________ times. Complete this exercise __________ times a day. Hamstring curls Stand straight, knees close together, facing the back of a chair. Hold on to the back of a chair with both hands. Keep one leg straight. Bend the other knee while bringing the heel up toward the buttock until the knee is bent at a 90-degree angle (right angle). Hold for 3-5 seconds. Repeat __________ times. Complete this exercise __________ times a day. Wall squat Stand straight with your back, hips, and head against a wall. Step forward one foot at a time with your back still against the wall. Your feet should be 2 feet (61 cm) from the wall at shoulder width. Keeping your back, hips,  and head against the wall, slide down the wall to as close of a sitting position as you can get. Hold for 5-10 seconds, then slowly slide back up. Repeat __________ times. Complete this exercise __________ times a day. Step-ups Step up with one foot onto a sturdy platform or stool that is about 6 inches (15 cm) high. Face sideways with one foot on the platform and one on the ground. Place all your weight on the platform foot and lift your body off the ground until your knee extends. Let your other leg hang free to the side. Hold for 3-5 seconds then slowly lower your weight down to the floor foot. Repeat __________ times. Complete this exercise __________  times a day. Contact a health care provider if: Your exercise causes pain. Your pain is worse after you exercise. Your pain prevents you from doing your exercises. This information is not intended to replace advice given to you by your health care provider. Make sure you discuss any questions you have with your health care provider. Document Revised: 01/03/2020 Document Reviewed: 08/27/2019 [employer] Patient Education  2023 [employer].

## 2022-12-29 DIAGNOSIS — Z658 Other specified problems related to psychosocial circumstances: Secondary | ICD-10-CM | POA: Diagnosis not present

## 2022-12-29 DIAGNOSIS — F419 Anxiety disorder, unspecified: Secondary | ICD-10-CM | POA: Diagnosis not present

## 2022-12-29 DIAGNOSIS — F3181 Bipolar II disorder: Secondary | ICD-10-CM | POA: Diagnosis not present

## 2023-01-11 ENCOUNTER — Other Ambulatory Visit: Payer: Self-pay | Admitting: Nurse Practitioner

## 2023-02-11 DIAGNOSIS — Z658 Other specified problems related to psychosocial circumstances: Secondary | ICD-10-CM | POA: Diagnosis not present

## 2023-02-11 DIAGNOSIS — F4329 Adjustment disorder with other symptoms: Secondary | ICD-10-CM | POA: Diagnosis not present

## 2023-02-11 DIAGNOSIS — F3181 Bipolar II disorder: Secondary | ICD-10-CM | POA: Diagnosis not present

## 2023-02-11 DIAGNOSIS — F419 Anxiety disorder, unspecified: Secondary | ICD-10-CM | POA: Diagnosis not present

## 2023-02-15 ENCOUNTER — Ambulatory Visit (INDEPENDENT_AMBULATORY_CARE_PROVIDER_SITE_OTHER): Admitting: Nurse Practitioner

## 2023-02-15 ENCOUNTER — Encounter: Payer: Self-pay | Admitting: Nurse Practitioner

## 2023-02-15 VITALS — BP 122/86 | HR 73 | Temp 98.1°F | Ht 66.0 in | Wt 134.0 lb

## 2023-02-15 DIAGNOSIS — J069 Acute upper respiratory infection, unspecified: Secondary | ICD-10-CM

## 2023-02-15 DIAGNOSIS — R062 Wheezing: Secondary | ICD-10-CM

## 2023-02-15 DIAGNOSIS — R0989 Other specified symptoms and signs involving the circulatory and respiratory systems: Secondary | ICD-10-CM | POA: Diagnosis not present

## 2023-02-15 DIAGNOSIS — J029 Acute pharyngitis, unspecified: Secondary | ICD-10-CM

## 2023-02-15 LAB — POCT RAPID STREP A (OFFICE): Rapid Strep A Screen: NEGATIVE

## 2023-02-15 MED ORDER — IPRATROPIUM-ALBUTEROL 0.5-2.5 (3) MG/3ML IN SOLN: 3.0000 mL | Freq: Once | RESPIRATORY_TRACT | Status: DC

## 2023-02-15 MED ORDER — PREDNISONE 10 MG PO TABS
ORAL_TABLET | ORAL | 0 refills | Status: DC
Start: 2023-02-15 — End: 2023-03-29

## 2023-02-15 NOTE — Progress Notes (Signed)
Assessment and Plan:  Bonnie Hall was seen today for an episodic visit .  Diagnoses and all order for this visit:  1. Upper respiratory tract infection, unspecified type Start steroid taper If s/s fail to improve contact office further further evaluation and treatment.  - predniSONE (DELTASONE) 10 MG tablet; 1 tab 3 x day for 2 days, then 1 tab 2 x day for 2 days, then 1 tab 1 x day for 3 days  Dispense: 13 tablet; Refill: 0  2. Chest congestion Stay well hydrated to help keep mucus thin and productive.  - predniSONE (DELTASONE) 10 MG tablet; 1 tab 3 x day for 2 days, then 1 tab 2 x day for 2 days, then 1 tab 1 x day for 3 days  Dispense: 13 tablet; Refill: 0  3. Wheezing Duoneb administered - patient tolerated well  - ipratropium-albuterol (DUONEB) 0.5-2.5 (3) MG/3ML nebulizer solution 3 mL - predniSONE (DELTASONE) 10 MG tablet; 1 tab 3 x day for 2 days, then 1 tab 2 x day for 2 days, then 1 tab 1 x day for 3 days  Dispense: 13 tablet; Refill: 0  4. Sore throat Negative Strep Warm salt water gargles several times throughout the day.  - ipratropium-albuterol (DUONEB) 0.5-2.5 (3) MG/3ML nebulizer solution 3 mL - POCT rapid strep A  Notify office for further evaluation and treatment, questions or concerns if s/s fail to improve. The risks and benefits of my recommendations, as well as other treatment options were discussed with the patient today. Questions were answered.  Further disposition pending results of labs. Discussed med's effects and SE's.    Over 20 minutes of exam, counseling, chart review, and critical decision making was performed.   Future Appointments  Date Time Provider Department Center  03/29/2023 11:30 AM Adela Glimpse, NP [facility] None  09/29/2023  9:00 AM Jiana Lemaire, Archie Patten, NP [facility] None    ------------------------------------------------------------------------------------------------------------------   HPI BP 122/86   Pulse 73   Temp  98.1 F (36.7 C)   Ht 5\' 6"  (1.676 m)   Wt 134 lb (60.8 kg)   LMP 03/12/2012   SpO2 99%   BMI 21.63 kg/m   Patient complains of symptoms of a URI. Symptoms include congestion, cough described as nonproductive, post nasal drip, sore throat, and wheezing. Onset of symptoms was 5 days ago, and has been unchanged since that time. Treatment to date: cough suppressants and decongestants.  Denies fever, chills, N/V.  She has never been a smoker.  Past Medical History:  Diagnosis Date   Allergy    Hay fever/exercise induced asthma   AMA (advanced maternal age) multigravida 35+    Anxiety    on meds   Arthritis    generalized   Asthma    Blood transfusion 1990's   Depression    bipolar=on meds   GERD (gastroesophageal reflux disease)    on meds   Hyperlipidemia    on meds   Iron deficiency anemia 1990's   negative work up as to etiology; required iron infusion, blood transfusion.  S[facility] an hematologist  at [facility].    Left-sided Bell's palsy 02/03/2021   Migraine    Newborn product of in vitro fertilization (IVF) pregnancy    Pregnancy induced hypertension    on meds   Preterm labor    Sleep apnea    no CPAP   Thrombocytosis    Vaginal Pap smear, abnormal      No Known Allergies  Current Outpatient Medications on File  Prior to Visit  Medication Sig   ALPRAZolam (XANAX) 1 MG tablet Take 1 mg by mouth at bedtime as needed.   ARIPiprazole (ABILIFY) 10 MG tablet Take 10 mg by mouth daily.   aspirin EC 81 MG tablet Take 1 tablet (81 mg total) by mouth daily. (Patient taking differently: Take 81 mg by mouth at bedtime.)   benzonatate (TESSALON PERLES) 100 MG capsule Take 1 capsule (100 mg total) by mouth every 6 (six) hours as needed for cough.   busPIRone (BUSPAR) 10 MG tablet Take 10 mg by mouth daily.   butalbital-acetaminophen-caffeine (FIORICET) 50-325-40 MG tablet TAKE 1 TO 2 TABLETS EVERY 6 HOURS AS NEEDED ONSET OF HEADACHE. MAX 6 TABS PER EVENT. MAX 30 A MONTH   CLONAZEPAM PO  Take 2 mg by mouth daily at 6 (six) AM.   DimenhyDRINATE (MOTION SICKNESS RELIEF PO) Take 1-2 tablets by mouth at bedtime as needed (motion sickness).   esomeprazole (NEXIUM) 40 MG capsule Take 40 mg by mouth daily at 6 (six) AM.   lamoTRIgine (LAMICTAL) 200 MG tablet Take 200 mg by mouth at bedtime.   Lurasidone HCl 60 MG TABS Take 60 mg by mouth at bedtime.   montelukast (SINGULAIR) 10 MG tablet TAKE ONE TABLET BY MOUTH EVERY DAY AT BEDTIME   promethazine (PHENERGAN) 25 MG tablet Take 1 tablet (25 mg total) by mouth every 6 (six) hours as needed for nausea or vomiting (can cause fatigue). Max: 4 tablets per day   promethazine-dextromethorphan (PROMETHAZINE-DM) 6.25-15 MG/5ML syrup Take 5 mLs by mouth 4 (four) times daily as needed for cough.   SODIUM FLUORIDE 5000 PPM 1.1 % PSTE Take 1 Application by mouth in the morning and at bedtime.   traZODone (DESYREL) 100 MG tablet Take 1.5 tablets by mouth at bedtime.   vortioxetine HBr (TRINTELLIX) 20 MG TABS tablet Take 20 mg by mouth daily.   doxycycline (VIBRA-TABS) 100 MG tablet Take 1 tablet (100 mg total) by mouth 2 (two) times daily. (Patient not taking: Reported on 12/21/2022)   Current Facility-Administered Medications on File Prior to Visit  Medication   0.9 %  sodium chloride infusion    ROS: all negative except what is noted in the HPI.   Physical Exam:  BP 122/86   Pulse 73   Temp 98.1 F (36.7 C)   Ht 5\' 6"  (1.676 m)   Wt 134 lb (60.8 kg)   LMP 03/12/2012   SpO2 99%   BMI 21.63 kg/m   General Appearance: NAD.  Awake, conversant and cooperative. Eyes: PERRLA, EOMs intact.  Sclera white.  Conjunctiva without erythema. Sinuses: No frontal/maxillary tenderness.  No nasal discharge. Nares patent.  ENT/Mouth: Ext aud canals clear.  Bilateral TMs w/DOL and without erythema or bulging. Hearing intact.  Posterior pharynx without swelling or exudate.  Tonsils without swelling or erythema.  Neck: Supple.  No masses, nodules or  thyromegaly. Respiratory: Effort is regular with non-labored breathing. Breath sounds are equal bilaterally with scattered wheezing I  upper posterior lung fields upon expiration. Cardio: RRR with no MRGs. Brisk peripheral pulses without edema.  Abdomen: Active BS in all four quadrants.  Soft and non-tender without guarding, rebound tenderness, hernias or masses. Lymphatics: Non tender without lymphadenopathy.  Musculoskeletal: Full ROM, 5/5 strength, normal ambulation.  No clubbing or cyanosis. Skin: Appropriate color for ethnicity. Warm without rashes, lesions, ecchymosis, ulcers.  Neuro: CN II-XII grossly normal. Normal muscle tone without cerebellar symptoms and intact sensation.   Psych: AO X 3,  appropriate mood and affect, insight and judgment.     Adela Glimpse, NP 11:14 AM [facility]

## 2023-02-15 NOTE — Patient Instructions (Signed)

## 2023-02-17 ENCOUNTER — Telehealth: Payer: Self-pay | Admitting: Nurse Practitioner

## 2023-02-17 ENCOUNTER — Other Ambulatory Visit: Payer: Self-pay | Admitting: Nurse Practitioner

## 2023-02-17 DIAGNOSIS — J069 Acute upper respiratory infection, unspecified: Secondary | ICD-10-CM

## 2023-02-17 DIAGNOSIS — R062 Wheezing: Secondary | ICD-10-CM

## 2023-02-17 DIAGNOSIS — R0989 Other specified symptoms and signs involving the circulatory and respiratory systems: Secondary | ICD-10-CM

## 2023-02-17 MED ORDER — AZITHROMYCIN 250 MG PO TABS
ORAL_TABLET | ORAL | 1 refills | Status: DC
Start: 2023-02-17 — End: 2023-03-29

## 2023-02-17 NOTE — Telephone Encounter (Signed)
Patient saw you earlier this week. States that despite taking the steroid and cough medication, her cough is getting worse. It has been somewhat productive, the phlegm she has been able to produce is yellow. Please advise.Marland Kitchen

## 2023-03-09 DIAGNOSIS — F419 Anxiety disorder, unspecified: Secondary | ICD-10-CM | POA: Diagnosis not present

## 2023-03-09 DIAGNOSIS — G4700 Insomnia, unspecified: Secondary | ICD-10-CM | POA: Diagnosis not present

## 2023-03-09 DIAGNOSIS — F3181 Bipolar II disorder: Secondary | ICD-10-CM | POA: Diagnosis not present

## 2023-03-14 DIAGNOSIS — Z419 Encounter for procedure for purposes other than remedying health state, unspecified: Secondary | ICD-10-CM | POA: Diagnosis not present

## 2023-03-25 ENCOUNTER — Other Ambulatory Visit: Payer: Self-pay | Admitting: *Deleted

## 2023-03-25 ENCOUNTER — Telehealth: Payer: Self-pay | Admitting: *Deleted

## 2023-03-25 DIAGNOSIS — M19071 Primary osteoarthritis, right ankle and foot: Secondary | ICD-10-CM

## 2023-03-25 DIAGNOSIS — M545 Low back pain, unspecified: Secondary | ICD-10-CM

## 2023-03-25 DIAGNOSIS — M19041 Primary osteoarthritis, right hand: Secondary | ICD-10-CM

## 2023-03-25 DIAGNOSIS — M51369 Other intervertebral disc degeneration, lumbar region without mention of lumbar back pain or lower extremity pain: Secondary | ICD-10-CM

## 2023-03-25 DIAGNOSIS — M2550 Pain in unspecified joint: Secondary | ICD-10-CM

## 2023-03-25 DIAGNOSIS — M5136 Other intervertebral disc degeneration, lumbar region: Secondary | ICD-10-CM

## 2023-03-25 DIAGNOSIS — M7061 Trochanteric bursitis, right hip: Secondary | ICD-10-CM

## 2023-03-25 DIAGNOSIS — M19042 Primary osteoarthritis, left hand: Secondary | ICD-10-CM

## 2023-03-25 DIAGNOSIS — M5030 Other cervical disc degeneration, unspecified cervical region: Secondary | ICD-10-CM

## 2023-03-25 DIAGNOSIS — M542 Cervicalgia: Secondary | ICD-10-CM

## 2023-03-25 DIAGNOSIS — M79671 Pain in right foot: Secondary | ICD-10-CM

## 2023-03-25 DIAGNOSIS — G8929 Other chronic pain: Secondary | ICD-10-CM

## 2023-03-25 DIAGNOSIS — M79641 Pain in right hand: Secondary | ICD-10-CM

## 2023-03-25 NOTE — Telephone Encounter (Signed)
We can refer her to pain management for treatment options.

## 2023-03-25 NOTE — Telephone Encounter (Signed)
I called patient, referral placed for pain management.

## 2023-03-25 NOTE — Telephone Encounter (Signed)
Patient states she has been diagnosed with OA. Patient states she was advised to take Tylenol. Patient states the Tylenol is not helping. Patient states she is hurting all day everyday and it is interrupting her sleep. Patient states she is barely able to walk when getting out of bed. Patient wants to know if there is anything else she can take or anything stronger Dr.Deveshwar can prescribe for her. I advised I would forward the message but did advise the patient we do not prescribe narcotic medications.

## 2023-03-25 NOTE — Telephone Encounter (Signed)
Attempted to contact the patient and left message for patient to call the office.  

## 2023-03-29 ENCOUNTER — Ambulatory Visit (INDEPENDENT_AMBULATORY_CARE_PROVIDER_SITE_OTHER): Admitting: Nurse Practitioner

## 2023-03-29 ENCOUNTER — Encounter: Payer: Self-pay | Admitting: Nurse Practitioner

## 2023-03-29 VITALS — BP 102/68 | HR 60 | Temp 97.5°F | Ht 66.0 in | Wt 135.4 lb

## 2023-03-29 DIAGNOSIS — E782 Mixed hyperlipidemia: Secondary | ICD-10-CM | POA: Diagnosis not present

## 2023-03-29 DIAGNOSIS — G43809 Other migraine, not intractable, without status migrainosus: Secondary | ICD-10-CM

## 2023-03-29 DIAGNOSIS — F3175 Bipolar disorder, in partial remission, most recent episode depressed: Secondary | ICD-10-CM

## 2023-03-29 DIAGNOSIS — Z79899 Other long term (current) drug therapy: Secondary | ICD-10-CM

## 2023-03-29 DIAGNOSIS — R7309 Other abnormal glucose: Secondary | ICD-10-CM

## 2023-03-29 DIAGNOSIS — R0989 Other specified symptoms and signs involving the circulatory and respiratory systems: Secondary | ICD-10-CM | POA: Diagnosis not present

## 2023-03-29 DIAGNOSIS — K219 Gastro-esophageal reflux disease without esophagitis: Secondary | ICD-10-CM | POA: Diagnosis not present

## 2023-03-29 DIAGNOSIS — E559 Vitamin D deficiency, unspecified: Secondary | ICD-10-CM

## 2023-03-29 DIAGNOSIS — J3089 Other allergic rhinitis: Secondary | ICD-10-CM | POA: Diagnosis not present

## 2023-03-29 DIAGNOSIS — G245 Blepharospasm: Secondary | ICD-10-CM

## 2023-03-29 LAB — CBC WITH DIFFERENTIAL/PLATELET
Absolute Monocytes: 351 cells/uL (ref 200–950)
Neutrophils Relative %: 66.9 %
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
## 2023-03-29 NOTE — Patient Instructions (Signed)

## 2023-03-30 LAB — COMPLETE METABOLIC PANEL WITH GFR
AG Ratio: 2 (calc) (ref 1.0–2.5)
ALT: 8 U/L (ref 6–29)
AST: 10 U/L (ref 10–35)
Albumin: 4.5 g/dL (ref 3.6–5.1)
Alkaline phosphatase (APISO): 54 U/L (ref 37–153)
BUN: 22 mg/dL (ref 7–25)
CO2: 28 mmol/L (ref 20–32)
Calcium: 9.5 mg/dL (ref 8.6–10.4)
Chloride: 105 mmol/L (ref 98–110)
Creat: 0.8 mg/dL (ref 0.50–1.05)
Globulin: 2.3 g/dL (calc) (ref 1.9–3.7)
Glucose, Bld: 101 mg/dL — ABNORMAL HIGH (ref 65–99)
Potassium: 4.3 mmol/L (ref 3.5–5.3)
Sodium: 139 mmol/L (ref 135–146)
Total Bilirubin: 0.3 mg/dL (ref 0.2–1.2)
Total Protein: 6.8 g/dL (ref 6.1–8.1)
eGFR: 84 mL/min/{1.73_m2} (ref >=60)

## 2023-03-30 LAB — CBC WITH DIFFERENTIAL/PLATELET
Basophils Absolute: 59 cells/uL (ref 0–200)
Basophils Relative: 1.1 %
Eosinophils Absolute: 70 cells/uL (ref 15–500)
Eosinophils Relative: 1.3 %
HCT: 38.1 % (ref 35.0–45.0)
Hemoglobin: 12.6 g/dL (ref 11.7–15.5)
Lymphs Abs: 1307 cells/uL (ref 850–3900)
MCH: 31.5 pg (ref 27.0–33.0)
MCHC: 33.1 g/dL (ref 32.0–36.0)
MCV: 95.3 fL (ref 80.0–100.0)
MPV: 10.3 fL (ref 7.5–12.5)
Monocytes Relative: 6.5 %
Neutro Abs: 3613 cells/uL (ref 1500–7800)
Platelets: 413 10*3/uL — ABNORMAL HIGH (ref 140–400)
RBC: 4 10*6/uL (ref 3.80–5.10)
RDW: 11.9 % (ref 11.0–15.0)
Total Lymphocyte: 24.2 %
WBC: 5.4 10*3/uL (ref 3.8–10.8)

## 2023-03-30 LAB — LIPID PANEL
Cholesterol: 229 mg/dL — ABNORMAL HIGH (ref <200)
HDL: 70 mg/dL (ref >=50)
LDL Cholesterol (Calc): 139 mg/dL (calc) — ABNORMAL HIGH
Non-HDL Cholesterol (Calc): 159 mg/dL (calc) — ABNORMAL HIGH (ref <130)
Total CHOL/HDL Ratio: 3.3 (calc) (ref <5.0)
Triglycerides: 102 mg/dL (ref <150)

## 2023-03-30 LAB — HEMOGLOBIN A1C W/OUT EAG: Hgb A1c MFr Bld: 5.3 % of total Hgb (ref <5.7)

## 2023-03-31 DIAGNOSIS — M7989 Other specified soft tissue disorders: Secondary | ICD-10-CM | POA: Diagnosis not present

## 2023-03-31 DIAGNOSIS — L600 Ingrowing nail: Secondary | ICD-10-CM | POA: Diagnosis not present

## 2023-04-11 ENCOUNTER — Encounter: Payer: Self-pay | Admitting: Physical Medicine & Rehabilitation

## 2023-04-13 ENCOUNTER — Other Ambulatory Visit: Payer: Self-pay | Admitting: *Deleted

## 2023-04-13 DIAGNOSIS — G4700 Insomnia, unspecified: Secondary | ICD-10-CM | POA: Diagnosis not present

## 2023-04-13 DIAGNOSIS — F4329 Adjustment disorder with other symptoms: Secondary | ICD-10-CM | POA: Diagnosis not present

## 2023-04-13 DIAGNOSIS — F3181 Bipolar II disorder: Secondary | ICD-10-CM | POA: Diagnosis not present

## 2023-04-13 MED ORDER — MELOXICAM 7.5 MG PO TABS
7.5000 mg | ORAL_TABLET | Freq: Every day | ORAL | 0 refills | Status: DC
Start: 2023-04-13 — End: 2023-11-09

## 2023-04-13 NOTE — Telephone Encounter (Signed)
Attempted to contact the patient and left message for patient to call the office.  

## 2023-04-13 NOTE — Telephone Encounter (Signed)
It is okay to send a prescription for meloxicam 7.5 mg p.o. daily, 30-day supply.  Please advise patient that meloxicam can cause gastroesophageal reflux, GI bleed, elevated creatinine, elevated kidney function.

## 2023-04-13 NOTE — Telephone Encounter (Signed)
Patient advised Dr. Corliss Skains will send a prescription for meloxicam 7.5 mg p.o. daily, 30-day supply. Patient advised that meloxicam can cause gastroesophageal reflux, GI bleed, elevated creatinine, elevated kidney function. Patient expressed understanding.

## 2023-04-13 NOTE — Addendum Note (Signed)
Addended by: Henriette Combs on: 04/13/2023 01:40 PM   Modules accepted: Orders

## 2023-04-13 NOTE — Telephone Encounter (Signed)
Patient contacted the office stating we referred her to pain management. Patient states her appointment is not scheduled until 05/19/2023. Patient states should would like to know if you would be willing to prescribe Meloxicam until she has her appointment. Patient uses the [facility] [address]. Please advise.

## 2023-04-14 DIAGNOSIS — Z419 Encounter for procedure for purposes other than remedying health state, unspecified: Secondary | ICD-10-CM | POA: Diagnosis not present

## 2023-04-21 ENCOUNTER — Other Ambulatory Visit: Payer: Self-pay | Admitting: Nurse Practitioner

## 2023-05-03 ENCOUNTER — Ambulatory Visit: Admitting: Nurse Practitioner

## 2023-05-03 ENCOUNTER — Encounter: Payer: Self-pay | Admitting: Nurse Practitioner

## 2023-05-03 VITALS — BP 120/70 | HR 76 | Temp 97.9°F | Ht 66.0 in | Wt 138.2 lb

## 2023-05-03 DIAGNOSIS — R1909 Other intra-abdominal and pelvic swelling, mass and lump: Secondary | ICD-10-CM

## 2023-05-03 DIAGNOSIS — W19XXXA Unspecified fall, initial encounter: Secondary | ICD-10-CM

## 2023-05-03 NOTE — Progress Notes (Signed)
Assessment and Plan:  Bonnie Hall was seen today for an episodic visit .  Diagnoses and all order for this visit:  1. Abdominal mass of other site LUQ Most likely hernia. Will obtain US d/t reports of tenderness to palpation. Monitor for increase in size, shape, discoloration.    - US Abdomen Complete; Future  2.  Fall initial encounter Discussed fall precaution measures Continue to monitor  Notify office for further evaluation and treatment, questions or concerns if s/s fail to improve. The risks and benefits of my recommendations, as well as other treatment options were discussed with the patient today. Questions were answered.  Further disposition pending results of labs. Discussed med's effects and SE's.    Over 15 minutes of exam, counseling, chart review, and critical decision making was performed.   Future Appointments  Date Time Provider Department Center  05/19/2023 12:30 PM Kirsteins, Victorino Sparrow, MD [facility]  06/29/2023 11:30 AM Adela Glimpse, NP [facility] None  09/29/2023  9:00 AM Adela Glimpse, NP [facility] None    ------------------------------------------------------------------------------------------------------------------   HPI BP 120/70   Pulse 76   Temp 97.9 F (36.6 C)   Ht 5\' 6"  (1.676 m)   Wt 138 lb 3.2 oz (62.7 kg)   LMP 03/12/2012   SpO2 96%   BMI 22.31 kg/m   62 y.o.female presents for evaluation of LUQ soft mass that was noticed approximately this morning while showering. Area is located just to the left of the xiphoid process.    She is unsure if the area came after a fall going up her stairs and then down her stairs a few weeks ago.  She reports landing on her back and on her chest area but did not notice any injury after the fall.  She did not it her head.  She has no other pain.  She notices tenderness to palpation, however, not pain.  There is no discoloration or bruising.  She denies any difficulty breathing.    Past Medical  History:  Diagnosis Date   Allergy    Hay fever/exercise induced asthma   AMA (advanced maternal age) multigravida 35+    Anxiety    on meds   Arthritis    generalized   Asthma    Blood transfusion 1990's   Depression    bipolar=on meds   GERD (gastroesophageal reflux disease)    on meds   Hyperlipidemia    on meds   Iron deficiency anemia 1990's   negative work up as to etiology; required iron infusion, blood transfusion.  Saw an hematologist  at [facility].    Left-sided Bell's palsy 02/03/2021   Migraine    Newborn product of in vitro fertilization (IVF) pregnancy    Pregnancy induced hypertension    on meds   Preterm labor    Sleep apnea    no CPAP   Thrombocytosis    Vaginal Pap smear, abnormal      No Known Allergies  Current Outpatient Medications on File Prior to Visit  Medication Sig   ALPRAZolam (XANAX) 1 MG tablet Take 1 mg by mouth at bedtime as needed.   ARIPiprazole (ABILIFY) 10 MG tablet Take 10 mg by mouth daily.   aspirin EC 81 MG tablet Take 1 tablet (81 mg total) by mouth daily. (Patient taking differently: Take 81 mg by mouth at bedtime.)   busPIRone (BUSPAR) 10 MG tablet Take 10 mg by mouth daily.   butalbital-acetaminophen-caffeine (FIORICET) 50-325-40 MG tablet TAKE 1 TO 2 TABLETS  EVERY 6 HOURS AS NEEDED ONSET OF HEADACHE. MAX 6 TABS PER EVENT. MAX 30 A MONTH   CLONAZEPAM PO Take 2 mg by mouth daily at 6 (six) AM.   DimenhyDRINATE (MOTION SICKNESS RELIEF PO) Take 1-2 tablets by mouth at bedtime as needed (motion sickness).   lamoTRIgine (LAMICTAL) 200 MG tablet Take 200 mg by mouth at bedtime.   Lurasidone HCl 60 MG TABS Take 60 mg by mouth at bedtime.   meloxicam (MOBIC) 7.5 MG tablet Take 1 tablet (7.5 mg total) by mouth daily.   montelukast (SINGULAIR) 10 MG tablet TAKE ONE TABLET BY MOUTH EVERY DAY AT BEDTIME   NEXIUM 40 MG capsule TAKE ONE CAPSULE BY MOUTH EVERY DAY   promethazine (PHENERGAN) 25 MG tablet Take 1 tablet (25 mg total) by mouth every  6 (six) hours as needed for nausea or vomiting (can cause fatigue). Max: 4 tablets per day   promethazine-dextromethorphan (PROMETHAZINE-DM) 6.25-15 MG/5ML syrup Take 5 mLs by mouth 4 (four) times daily as needed for cough.   SODIUM FLUORIDE 5000 PPM 1.1 % PSTE Take 1 Application by mouth in the morning and at bedtime.   vortioxetine HBr (TRINTELLIX) 20 MG TABS tablet Take 20 mg by mouth daily.   traZODone (DESYREL) 100 MG tablet Take 1.5 tablets by mouth at bedtime.   Current Facility-Administered Medications on File Prior to Visit  Medication   0.9 %  sodium chloride infusion   ipratropium-albuterol (DUONEB) 0.5-2.5 (3) MG/3ML nebulizer solution 3 mL    ROS: all negative except what is noted in the HPI.   Physical Exam:  BP 120/70   Pulse 76   Temp 97.9 F (36.6 C)   Ht 5\' 6"  (1.676 m)   Wt 138 lb 3.2 oz (62.7 kg)   LMP 03/12/2012   SpO2 96%   BMI 22.31 kg/m   General Appearance: NAD.  Awake, conversant and cooperative. Eyes: PERRLA, EOMs intact.  Sclera white.  Conjunctiva without erythema. Sinuses: No frontal/maxillary tenderness.  No nasal discharge. Nares patent.  ENT/Mouth: Ext aud canals clear.  Bilateral TMs w/DOL and without erythema or bulging. Hearing intact.  Posterior pharynx without swelling or exudate.  Tonsils without swelling or erythema.  Neck: Supple.  No masses, nodules or thyromegaly. Respiratory: Effort is regular with non-labored breathing. Breath sounds are equal bilaterally without rales, rhonchi, wheezing or stridor.  Cardio: RRR with no MRGs. Brisk peripheral pulses without edema.  Abdomen: LUQ just to the left of the xiphoid process with soft palpable non-moveable mass similar to a hernia. Approximatley 2 cm oval shape in size.  No skin discoloration.  Slight tenderness to touch.   Lymphatics: Non tender without lymphadenopathy.  Musculoskeletal: Full ROM, 5/5 strength, normal ambulation.  No clubbing or cyanosis. Skin: Appropriate color for  ethnicity. Warm without rashes, lesions, ecchymosis, ulcers.  Neuro: CN II-XII grossly normal. Normal muscle tone without cerebellar symptoms and intact sensation.   Psych: AO X 3,  appropriate mood and affect, insight and judgment.     Adela Glimpse, NP 3:41 PM [facility]

## 2023-05-03 NOTE — Patient Instructions (Signed)
YOU CAN CALL TO MAKE AN ULTRASOUND..  I have put in an order for an ultrasound for you to have You can set them up at your convenience by calling this number [phone] You will likely have the ultrasound at [address] [facility] [address]  If you have any issues call our office and we will set this up for you.    Hernia, Adult     A hernia happens when an organ or tissue inside your body pushes out through a weak spot in the muscles of your belly (abdomen). This makes a bulge. The bulge may be: In a scar from a surgery that was done in your belly (incisional hernia). Near your belly button (umbilical hernia). In your groin (inguinal hernia). Your groin is the area where your leg meets your lower belly. If you are a female, this type could also be in your scrotum. In your upper thigh (femoral hernia). Inside your belly (hiatal hernia). This happens when your stomach slides above the muscle between your belly and your chest (diaphragm). What are the causes? This condition may be caused by: Lifting heavy things. Coughing over a long period of time. Having trouble pooping (constipation). Trouble pooping can lead to straining. A cut from surgery in your belly. A physical problem that is present at birth. Being very overweight. Smoking. Too much fluid in your belly. A testicle that has not moved down into the scrotum, in males. What are the signs or symptoms? The main symptom is a bulge in the area of the hernia, but a bulge may not always be seen. It may grow bigger or be easier to see when you cough or strain (such as when lifting something heavy). A hernia that can be pushed back into the belly rarely causes pain. A hernia that cannot be pushed back into the belly may lose its blood supply. This may cause: Pain. Fever. A feeling like you may vomit, and vomiting. Swelling. Trouble pooping. How is this treated? A hernia that is small and painless may not need to be treated. A hernia  that is large or painful may be treated with surgery. Surgery to treat a hernia involves pushing the bulge back into place and repairing the weak area of the muscle or belly. Follow these instructions at home: Activity Avoid straining the muscles near your hernia. This can happen when you: Lift something heavy. Poop (have a bowel movement). Do not lift anything that is heavier than 10 lb (4.5 kg), or the limit that you are told. When you lift something heavy, use your leg muscles. Do not use your back muscles to lift. Prevent trouble pooping If told by your doctor, take steps to prevent trouble pooping. You may need to: Drink enough fluid to keep your pee (urine) pale yellow. Take medicines. You will be told what medicines to take. Eat foods that are high in fiber. These include beans, whole grains, and fresh fruits and vegetables. Limit foods that are high in fat and sugar. These include fried or sweet foods. General instructions When you cough, try to cough gently. You may try to push your hernia back in by gently pressing on it when you are lying down. Do not try to force the bulge back in if it will not go in easily. If you are overweight, work with your doctor to lose weight safely. Do not smoke or use any products that contain nicotine or tobacco. If you need help quitting, ask your  doctor. If you will be having surgery, watch your hernia for changes in shape, size, or color. Tell your doctor if you see any changes. Take over-the-counter and prescription medicines only as told by your doctor. Keep all follow-up visits. Contact a doctor if: You get new pain, swelling, or redness near your hernia. You poop fewer times in a week than normal. You have trouble pooping. You have poop that is more dry than normal. You have poop that is harder or larger than normal. Get help right away if: You have a fever or chills. You have belly pain that gets worse. You feel like you may vomit, or  you vomit. Your hernia cannot be pushed in by gently pressing on it when you are lying down. Your hernia: Changes in shape or size. Changes color. Feels hard, or it hurts when you touch it. These symptoms may be an emergency. Get help right away. Call your local emergency services (911 in the [place] Do not wait to see if the symptoms will go away. Do not drive yourself to the hospital. Summary A hernia happens when an organ or tissue inside your body pushes out through a weak spot in the belly muscles. This creates a bulge. If your hernia is small and it does not hurt, you may not need treatment. If your hernia is large or it hurts, you may need surgery. If you will be having surgery, watch your hernia for changes in shape, size, or color. Tell your doctor about any changes. This information is not intended to replace advice given to you by your health care provider. Make sure you discuss any questions you have with your health care provider. Document Revised: 04/07/2020 Document Reviewed: 04/07/2020 Elsevier Patient Education  2024 [employer].

## 2023-05-04 ENCOUNTER — Telehealth: Payer: Self-pay | Admitting: Nurse Practitioner

## 2023-05-04 NOTE — Telephone Encounter (Signed)
[facility] told us the referral placed(ultrasound abdominal completed) will only show the organs. They are going to go in and update the exam name to ultrasound abdominal limited, which will show the mass and soft tissue.

## 2023-05-11 ENCOUNTER — Ambulatory Visit
Admission: RE | Admit: 2023-05-11 | Discharge: 2023-05-11 | Disposition: A | Discharge status: Home / Self Care | Source: Ambulatory Visit | Attending: Nurse Practitioner | Admitting: Nurse Practitioner

## 2023-05-11 DIAGNOSIS — R1909 Other intra-abdominal and pelvic swelling, mass and lump: Secondary | ICD-10-CM

## 2023-05-19 ENCOUNTER — Encounter: Admitting: Physical Medicine & Rehabilitation

## 2023-05-20 DIAGNOSIS — G4700 Insomnia, unspecified: Secondary | ICD-10-CM | POA: Diagnosis not present

## 2023-05-20 DIAGNOSIS — Z658 Other specified problems related to psychosocial circumstances: Secondary | ICD-10-CM | POA: Diagnosis not present

## 2023-05-20 DIAGNOSIS — F419 Anxiety disorder, unspecified: Secondary | ICD-10-CM | POA: Diagnosis not present

## 2023-05-20 DIAGNOSIS — F3181 Bipolar II disorder: Secondary | ICD-10-CM | POA: Diagnosis not present

## 2023-06-14 DIAGNOSIS — Z419 Encounter for procedure for purposes other than remedying health state, unspecified: Secondary | ICD-10-CM | POA: Diagnosis not present

## 2023-06-15 DIAGNOSIS — Z658 Other specified problems related to psychosocial circumstances: Secondary | ICD-10-CM | POA: Diagnosis not present

## 2023-06-15 DIAGNOSIS — F3181 Bipolar II disorder: Secondary | ICD-10-CM | POA: Diagnosis not present

## 2023-06-15 DIAGNOSIS — G4700 Insomnia, unspecified: Secondary | ICD-10-CM | POA: Diagnosis not present

## 2023-06-15 DIAGNOSIS — F419 Anxiety disorder, unspecified: Secondary | ICD-10-CM | POA: Diagnosis not present

## 2023-06-27 NOTE — Progress Notes (Unsigned)
Subjective:    Patient ID: Bonnie Hall, female    DOB: 07-16-1961, 62 y.o.   MRN: [id number]  HPI  CC:  Widespread body pain 62 year old female referred by rheumatology for pains in multiple areas.  She had no history of trauma or injuries.  She has been evaluated for inflammatory arthritis and workup was negative.  Had multiple x-rays performed in February 2024 which were all reviewed both reports as well as actual images. Hips , back and neck pain seem to be worst The patient was trialed on meloxicam however this was not any more effective than ibuprofen.  The patient states that due to child bearing responsibilities she cannot be drowsy.  She has a history of severe postpartum depression with diagnosis of bipolar disorder and has been on multiple psychiatric medications including both benzodiazepines, SSRIs, as well as mood stabilizing agents.  She also has been treated for chronic headaches has been on Fioricet as well as botulinum toxin injections.  Pt feels pain in joints and muscles but is not sure what is worst  Has not used meloxicam, takes occ Ibuprofen   Hand xrays show CMC, DIP , PIP OA   C spine C5-6 and C6-7 DDD L spine xray showing L4-5 , L5-S1 facet arthropathy   Has not tried PT   Husband disabled, young children at home ages 80 and twins who are 67.  Of note these are not adopted children but natural births at ages 58 and 75.  Pain Inventory Average Pain 7 Pain Right Now 7 My pain is constant, dull, and aching  In the last 24 hours, has pain interfered with the following? General activity 2 Relation with others 0 Enjoyment of life 2 What TIME of day is your pain at its worst? morning  Sleep (in general) Fair  Pain is worse with: walking Pain improves with: rest Relief from Meds: 3  walk without assistance ability to climb steps?  yes do you drive?  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  no  Any changes since last visit?  no    Family History  Problem Relation Age of Onset   Alzheimer's disease Mother 54   Prostate cancer Father    Arrhythmia Father    Colon cancer Paternal Uncle 78   Colon cancer Paternal Uncle 64   Stroke Maternal Grandmother    Stroke Maternal Grandfather    Rheum arthritis Paternal Grandmother    Hearing loss Daughter    Strabismus Daughter    Asperger's syndrome Daughter    ADD / ADHD Daughter    Intellectual disability Daughter    Polycystic ovary syndrome Daughter 70   Bipolar disorder Son    Colon polyps Neg Hx    Esophageal cancer Neg Hx    Stomach cancer Neg Hx    Rectal cancer Neg Hx    Social History   Socioeconomic History   Marital status: Married    Spouse name: Not on file   Number of children: 2   Years of education: Not on file   Highest education level: Not on file  Occupational History    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  Tobacco Use   Smoking status: Never    Passive exposure: Never   Smokeless tobacco: Never  Vaping Use   Vaping status: Never Used  Substance and Sexual Activity   Alcohol use: No   Drug use: No  Sexual activity: Not Currently    Partners: Male    Birth control/protection: Post-menopausal  Other Topics Concern   Not on file  Social History Narrative   Right handed   Drinks caffeine   Two story hom   Social Determinants of Health   Financial Resource Strain: Not on file  Food Insecurity: Not on file  Transportation Needs: Not on file  Physical Activity: Inactive (08/01/2019)   Exercise Vital Sign    Days of Exercise per Week: 0 days    Minutes of Exercise per Session: 0 min  Stress: Stress Concern Present (08/01/2019)   [employer] of Occupational Health - Occupational Stress Questionnaire    Feeling of Stress : Very much  Social Connections: Not on file   Past Surgical History:  Procedure Laterality Date   CESAREAN SECTION  2009   CESAREAN SECTION N/A 03/09/2017    Procedure: REPEAT CESAREAN SECTION;  Surgeon: Marcelle Overlie, MD;  Location: [facility] BIRTHING SUITES;  Service: Obstetrics;  Laterality: N/A;   COLONOSCOPY  06/2012   Dr.Kaplan-MAC-movi(exc)-normal-10 yr recall   GYNECOLOGIC CRYOSURGERY     NASAL SEPTUM SURGERY  1983   TONSILLECTOMY  1966   Past Medical History:  Diagnosis Date   Allergy    Hay fever/exercise induced asthma   AMA (advanced maternal age) multigravida 35+    Anxiety    on meds   Arthritis    generalized   Asthma    Blood transfusion 1990's   Depression    bipolar=on meds   GERD (gastroesophageal reflux disease)    on meds   Hyperlipidemia    on meds   Iron deficiency anemia 1990's   negative work up as to etiology; required iron infusion, blood transfusion.  S[facility] an hematologist  at [facility]    Left-sided Bell's palsy 02/03/2021   Migraine    Newborn product of in vitro fertilization (IVF) pregnancy    Pregnancy induced hypertension    on meds   Preterm labor    Sleep apnea    no CPAP   Thrombocytosis    Vaginal Pap smear, abnormal    LMP 03/12/2012   Opioid Risk Score:   Fall Risk Score:  `1  Depression screen [facility] 2/9     09/09/2021   10:17 AM 09/09/2020   10:22 AM 08/01/2019    2:09 PM  Depression screen PHQ 2/9  Decreased Interest 3 3 3   Down, Depressed, Hopeless 3 3 3   PHQ - 2 Score 6 6 6   Altered sleeping 2 3 3   Tired, decreased energy 3 3 3   Change in appetite 3 3 3   Feeling bad or failure about yourself  3 3 3   Trouble concentrating 3 3 3   Moving slowly or fidgety/restless 1 3 1   Suicidal thoughts 0 0 0  PHQ-9 Score 21 24 22   Difficult doing work/chores Very difficult Very difficult Very difficult    Review of Systems  Musculoskeletal:  Positive for arthralgias and back pain.       Pain all over the body  All other systems reviewed and are negative.      Objective:   Physical Exam Vitals and nursing note reviewed.  Constitutional:      Appearance: She is normal weight.  HENT:      Head: Normocephalic and atraumatic.  Eyes:     Extraocular Movements: Extraocular movements intact.     Conjunctiva/sclera: Conjunctivae normal.     Pupils: Pupils are equal, round, and reactive to light.  Musculoskeletal:     Right lower leg: No edema.     Left lower leg: No edema.     Comments: Alden Server to palpation in the cervical paraspinal area periscapular area, thoracic or lumbar spine.  No tenderness over the greater trochanters. The patient has full range of motion in shoulders elbows wrists fingers hips knees and ankles.  No pain with range of motion. No evidence of joint deformities.  No evidence of joint effusion. No evidence of erythema around the joints. Cervical and lumbar range of motion's are normal.  Skin:    General: Skin is warm and dry.  Neurological:     Mental Status: She is alert and oriented to person, place, and time.  Psychiatric:        Mood and Affect: Mood normal.        Behavior: Behavior normal.   Motor strength is 5/5 bilateral deltoid, bicep, tricep, grip, hip flexor, knee extensor, ankle dorsiflexor and plantar flexor Negative straight leg raising bilaterally Sensation normal bilateral upper and lower limbs Gait without evidence of toe drag or knee instability no evidence of antalgia        Assessment & Plan:   1.  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  No signs of joint deformities or effusions. We discussed the importance of staying active.  We discussed physical therapy although given her child rearing responsibilities she is unable to do so.  Will give her a home exercise program avoid lumbar extension. She may benefit from using diclofenac gel over the cervical spine bilateral knees as well as MCP PIP and DIP area. Would not recommend narcotic analgesics given her multiple psychiatric medications and potential interactions. Physical medicine rehab follow-up on as-needed  basis.

## 2023-06-28 ENCOUNTER — Encounter: Attending: Physical Medicine & Rehabilitation | Admitting: Physical Medicine & Rehabilitation

## 2023-06-28 ENCOUNTER — Encounter: Payer: Self-pay | Admitting: Physical Medicine & Rehabilitation

## 2023-06-28 VITALS — BP 113/73 | HR 73 | Ht 66.0 in | Wt 141.0 lb

## 2023-06-28 DIAGNOSIS — M130 Polyarthritis, unspecified: Secondary | ICD-10-CM

## 2023-06-28 DIAGNOSIS — M5030 Other cervical disc degeneration, unspecified cervical region: Secondary | ICD-10-CM

## 2023-06-28 DIAGNOSIS — M47816 Spondylosis without myelopathy or radiculopathy, lumbar region: Secondary | ICD-10-CM

## 2023-06-28 NOTE — Patient Instructions (Addendum)
Back Exercises These exercises help to make your trunk and back strong. They also help to keep the lower back flexible. Doing these exercises can help to prevent or lessen pain in your lower back. If you have back pain, try to do these exercises 2-3 times each day or as told by your doctor. As you get better, do the exercises once each day. Repeat the exercises more often as told by your doctor. To stop back pain from coming back, do the exercises once each day, or as told by your doctor. Do exercises exactly as told by your doctor. Stop right away if you feel sudden pain or your pain gets worse. Exercises Single knee to chest Do these steps 3-5 times in a row for each leg: Lie on your back on a firm bed or the floor with your legs stretched out. Bring one knee to your chest. Grab your knee or thigh with both hands and hold it in place. Pull on your knee until you feel a gentle stretch in your lower back or butt. Keep doing the stretch for 10-30 seconds. Slowly let go of your leg and straighten it. Pelvic tilt Do these steps 5-10 times in a row: Lie on your back on a firm bed or the floor with your legs stretched out. Bend your knees so they point up to the ceiling. Your feet should be flat on the floor. Tighten your lower belly (abdomen) muscles to press your lower back against the floor. This will make your tailbone point up to the ceiling instead of pointing down to your feet or the floor. Stay in this position for 5-10 seconds while you gently tighten your muscles and breathe evenly. Cat-cow Do these steps until your lower back bends more easily: Get on your hands and knees on a firm bed or the floor. Keep your hands under your shoulders, and keep your knees under your hips. You may put padding under your knees. Let your head hang down toward your chest. Tighten (contract) the muscles in your belly. Point your tailbone toward the floor so your lower back becomes rounded like the back of a  cat. Stay in this position for 5 seconds. Slowly lift your head. Let the muscles of your belly relax. Point your tailbone up toward the ceiling so your back forms a sagging arch like the back of a cow. Stay in this position for 5 seconds.  Henreitta Leber Do these steps 10 times in a row: Lie on your back on a firm bed or the floor. Bend your knees so they point up to the ceiling. Your feet should be flat on the floor. Your arms should be flat at your sides, next to your body. Tighten your butt muscles and lift your butt off the floor until your waist is almost as high as your knees. If you do not feel the muscles working in your butt and the back of your thighs, slide your feet 1-2 inches (2.5-5 cm) farther away from your butt. Stay in this position for 3-5 seconds. Slowly lower your butt to the floor, and let your butt muscles relax. If this exercise is too easy, try doing it with your arms crossed over your chest. Belly crunches Do these steps 5-10 times in a row: Lie on your back on a firm bed or the floor with your legs stretched out. Bend your knees so they point up to the ceiling. Your feet should be flat on the floor. [employer]  your arms over your chest. Tip your chin a little bit toward your chest, but do not bend your neck. Tighten your belly muscles and slowly raise your chest just enough to lift your shoulder blades a tiny bit off the floor. Avoid raising your body higher than that because it can put too much stress on your lower back. Slowly lower your chest and your head to the floor.  Contact a doctor if: Your back pain gets a lot worse when you do an exercise. Your back pain does not get better within 2 hours after you exercise. If you have any of these problems, stop doing the exercises. Do not do them again unless your doctor says it is okay. Get help right away if: You have sudden, very bad back pain. If this happens, stop doing the exercises. Do not do them again unless your  doctor says it is okay. This information is not intended to replace advice given to you by your health care provider. Make sure you discuss any questions you have with your health care provider. Document Revised: 11/12/2020 Document Reviewed: 11/12/2020 Elsevier Patient Education  2024 [employer] Inc.  DICLOFENAC GEL TO NECK HANDS AND KNEES 3-4 TIMES PER DAY

## 2023-06-29 ENCOUNTER — Ambulatory Visit: Admitting: Nurse Practitioner

## 2023-06-29 ENCOUNTER — Encounter: Payer: Self-pay | Admitting: Nurse Practitioner

## 2023-06-29 VITALS — BP 120/80 | HR 79 | Temp 97.9°F | Ht 66.0 in | Wt 142.2 lb

## 2023-06-29 DIAGNOSIS — Z79899 Other long term (current) drug therapy: Secondary | ICD-10-CM

## 2023-06-29 DIAGNOSIS — R0989 Other specified symptoms and signs involving the circulatory and respiratory systems: Secondary | ICD-10-CM | POA: Diagnosis not present

## 2023-06-29 DIAGNOSIS — E559 Vitamin D deficiency, unspecified: Secondary | ICD-10-CM

## 2023-06-29 DIAGNOSIS — F3175 Bipolar disorder, in partial remission, most recent episode depressed: Secondary | ICD-10-CM | POA: Diagnosis not present

## 2023-06-29 DIAGNOSIS — E782 Mixed hyperlipidemia: Secondary | ICD-10-CM | POA: Diagnosis not present

## 2023-06-29 DIAGNOSIS — K219 Gastro-esophageal reflux disease without esophagitis: Secondary | ICD-10-CM | POA: Diagnosis not present

## 2023-06-29 DIAGNOSIS — G43809 Other migraine, not intractable, without status migrainosus: Secondary | ICD-10-CM

## 2023-06-29 DIAGNOSIS — R7309 Other abnormal glucose: Secondary | ICD-10-CM

## 2023-06-29 DIAGNOSIS — I700 Atherosclerosis of aorta: Secondary | ICD-10-CM

## 2023-06-29 DIAGNOSIS — F439 Reaction to severe stress, unspecified: Secondary | ICD-10-CM

## 2023-06-29 MED ORDER — PANTOPRAZOLE SODIUM 40 MG PO TBEC
40.0000 mg | DELAYED_RELEASE_TABLET | Freq: Every day | ORAL | 3 refills | Status: DC
Start: 2023-06-29 — End: 2023-08-05

## 2023-06-29 NOTE — Patient Instructions (Signed)
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
## 2023-06-30 LAB — LIPID PANEL
Cholesterol: 228 mg/dL — ABNORMAL HIGH (ref <200)
HDL: 74 mg/dL (ref >=50)
LDL Cholesterol (Calc): 137 mg/dL — ABNORMAL HIGH
Non-HDL Cholesterol (Calc): 154 mg/dL — ABNORMAL HIGH (ref <130)
Total CHOL/HDL Ratio: 3.1 (calc) (ref <5.0)
Triglycerides: 76 mg/dL (ref <150)

## 2023-07-01 ENCOUNTER — Telehealth: Payer: Self-pay | Admitting: Nurse Practitioner
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
## 2023-07-01 NOTE — Telephone Encounter (Signed)
Left detailed message on voicemail.  

## 2023-07-15 DIAGNOSIS — Z419 Encounter for procedure for purposes other than remedying health state, unspecified: Secondary | ICD-10-CM | POA: Diagnosis not present

## 2023-08-05 ENCOUNTER — Encounter: Payer: Self-pay | Admitting: Nurse Practitioner

## 2023-08-05 ENCOUNTER — Ambulatory Visit (INDEPENDENT_AMBULATORY_CARE_PROVIDER_SITE_OTHER): Admitting: Nurse Practitioner

## 2023-08-05 VITALS — BP 120/80 | HR 73 | Temp 98.5°F | Ht 66.0 in | Wt 142.6 lb

## 2023-08-05 DIAGNOSIS — K219 Gastro-esophageal reflux disease without esophagitis: Secondary | ICD-10-CM

## 2023-08-05 DIAGNOSIS — K253 Acute gastric ulcer without hemorrhage or perforation: Secondary | ICD-10-CM

## 2023-08-05 DIAGNOSIS — R110 Nausea: Secondary | ICD-10-CM | POA: Diagnosis not present

## 2023-08-05 DIAGNOSIS — R1084 Generalized abdominal pain: Secondary | ICD-10-CM

## 2023-08-05 DIAGNOSIS — F439 Reaction to severe stress, unspecified: Secondary | ICD-10-CM

## 2023-08-05 DIAGNOSIS — R040 Epistaxis: Secondary | ICD-10-CM

## 2023-08-05 DIAGNOSIS — Z791 Long term (current) use of non-steroidal anti-inflammatories (NSAID): Secondary | ICD-10-CM

## 2023-08-05 MED ORDER — PANTOPRAZOLE SODIUM 40 MG PO TBEC
40.0000 mg | DELAYED_RELEASE_TABLET | Freq: Two times a day (BID) | ORAL | 0 refills | Status: DC
Start: 2023-08-05 — End: 2023-08-29

## 2023-08-05 MED ORDER — SUCRALFATE 1 GM/10ML PO SUSP
1.0000 g | Freq: Three times a day (TID) | ORAL | 0 refills | Status: DC
Start: 2023-08-05 — End: 2023-08-19

## 2023-08-05 NOTE — Progress Notes (Signed)
Assessment and Plan:  Bonnie Hall was seen today for an episodic visit.  Diagnoses and all order for this visit:  Generalized abdominal pain NSAID and stress induced ulcer - check for H. Pylori Monitor for increase fever chills, melena.  Nausea Consume bland foods and clear liquids - advance as tolerated. Monitor for increase in vomiting or hematemesis.  Gastroesophageal reflux disease, unspecified whether esophagitis present Increase Pantoprazole to BID Start Carafate as directed Lifestyle modification:  avoid meals 2-3h before bedtime. Consider eliminating food triggers:  chocolate, caffeine, EtOH, acid/spicy food.  Acute gastric ulcer without hemorrhage or perforation  - sucralfate (CARAFATE) 1 GM/10ML suspension; Take 10 mLs (1 g total) by mouth 4 (four) times daily -  with meals and at bedtime. Take 1 hour before meals and at bedtime for 4 weeks.  Dispense: 414 mL; Refill: 0  NSAID long-term use Discussed risk for long term NSAID use and increase in ulcer formation. Stop use of NSAID - replace with Acetaminophen - can take 1,000 mg QID  Stress at home Continue to work on self care and reducing stress.   Epistaxis Easily controlled  Likely related to NSAID use Stop NSAID Stay well hydrated  Orders Placed This Encounter  Procedures   H. pylori breath test   CBC with Differential/Platelet   COMPLETE METABOLIC PANEL WITH GFR   Meds ordered this encounter  Medications   pantoprazole (PROTONIX) 40 MG tablet    Sig: Take 1 tablet (40 mg total) by mouth 2 (two) times daily.    Dispense:  60 tablet    Refill:  0    Order Specific Question:   Supervising Provider    Answer:   Lucky Cowboy [[id number]]   sucralfate (CARAFATE) 1 GM/10ML suspension    Sig: Take 10 mLs (1 g total) by mouth 4 (four) times daily -  with meals and at bedtime. Take 1 hour before meals and at bedtime for 4 weeks.    Dispense:  414 mL    Refill:  0    Order Specific Question:    Supervising Provider    Answer:   Lucky Cowboy [phone]   Notify office for further evaluation and treatment, questions or concerns if any reported s/s fail to improve.   The patient was advised to call back or seek an in-person evaluation if any symptoms worsen or if the condition fails to improve as anticipated.   Further disposition pending results of labs. Discussed med's effects and SE's.    I discussed the assessment and treatment plan with the patient. The patient was provided an opportunity to ask questions and all were answered. The patient agreed with the plan and demonstrated an understanding of the instructions.  Discussed med's effects and SE's. Screening labs and tests as requested with regular follow-up as recommended.  I provided 30 minutes of face-to-face time during this encounter including counseling, chart review, and critical decision making was preformed.  Today's Plan of Care is based on a patient-centered health care approach known as shared decision making - the decisions, tests and treatments allow for patient preferences and values to be balanced with clinical evidence.     Future Appointments  Date Time Provider Department Center  08/05/2023 11:00 AM Rejoice Heatwole, Archie Patten, NP [facility] None  09/29/2023  9:00 AM Locklyn Henriquez, Archie Patten, NP [facility] None    ------------------------------------------------------------------------------------------------------------------   HPI BP 120/80   Pulse 73   Temp 98.5 F (36.9 C)   Ht 5\' 6"  (1.676 m)   Wt  142 lb 9.6 oz (64.7 kg)   LMP 03/12/2012   SpO2 99%   BMI 23.02 kg/m    Bonnie Hall is a 62 y.o. female who presents for evaluation of burning, dull, sharp, and shooting pain located in in the entire abdomen. Symptoms have been present for 3 months. Patient denies blood in stool, constipation, dark urine, fever, hematemesis, and melena. Patient's oral intake has been decreased for liquids and decreased for  solids. Patient's urine output has been adequate.  Patient denies recent travel history. Patient has not had recent ingestion of possible contaminated food, toxic plants, or inappropriate medications/poisons. Reports eating makes stomach feel better.  She does report long term NSAID use, daily, takes IBU 1600 mg daily with Excedrin.  Takes to help treat generalized pain and migraine.  Also takes Fioricet.  She has Meloxicam prescribed but reports does not take daily.  She also has Pantoprazole 40 mg that she takes daily.  Associated epistaxis in the AM, mild, well controlled.    Past Medical History:  Diagnosis Date   Allergy    Hay fever/exercise induced asthma   AMA (advanced maternal age) multigravida 35+    Anxiety    on meds   Arthritis    generalized   Asthma    Blood transfusion 1990's   Depression    bipolar=on meds   GERD (gastroesophageal reflux disease)    on meds   Hyperlipidemia    on meds   Iron deficiency anemia 1990's   negative work up as to etiology; required iron infusion, blood transfusion.  Saw an hematologist  at [facility].    Left-sided Bell's palsy 02/03/2021   Migraine    Newborn product of in vitro fertilization (IVF) pregnancy    Pregnancy induced hypertension    on meds   Preterm labor    Sleep apnea    no CPAP   Thrombocytosis    Vaginal Pap smear, abnormal      No Known Allergies  Current Outpatient Medications on File Prior to Visit  Medication Sig   ALPRAZolam (XANAX) 1 MG tablet Take 1 mg by mouth at bedtime as needed.   ARIPiprazole (ABILIFY) 10 MG tablet Take 10 mg by mouth daily.   aspirin EC 81 MG tablet Take 1 tablet (81 mg total) by mouth daily. (Patient taking differently: Take 81 mg by mouth at bedtime.)   butalbital-acetaminophen-caffeine (FIORICET) 50-325-40 MG tablet TAKE 1 TO 2 TABLETS EVERY 6 HOURS AS NEEDED ONSET OF HEADACHE. MAX 6 TABS PER EVENT. MAX 30 A MONTH   clonazePAM (KLONOPIN) 1 MG tablet Take by mouth.   DimenhyDRINATE  (MOTION SICKNESS RELIEF PO) Take 1-2 tablets by mouth at bedtime as needed (motion sickness).   erythromycin ophthalmic ointment Apply to eye.   lamoTRIgine (LAMICTAL) 200 MG tablet Take 200 mg by mouth at bedtime.   Lurasidone HCl 60 MG TABS Take 60 mg by mouth at bedtime.   meloxicam (MOBIC) 7.5 MG tablet Take 1 tablet (7.5 mg total) by mouth daily.   montelukast (SINGULAIR) 10 MG tablet TAKE ONE TABLET BY MOUTH EVERY DAY AT BEDTIME   pantoprazole (PROTONIX) 40 MG tablet Take 1 tablet (40 mg total) by mouth daily.   promethazine (PHENERGAN) 25 MG tablet Take 1 tablet (25 mg total) by mouth every 6 (six) hours as needed for nausea or vomiting (can cause fatigue). Max: 4 tablets per day   promethazine-dextromethorphan (PROMETHAZINE-DM) 6.25-15 MG/5ML syrup Take 5 mLs by mouth 4 (four) times daily as  needed for cough.   SODIUM FLUORIDE 5000 SENSITIVE 1.1-5 % GEL PLEASE SEE ATTACHED FOR DETAILED DIRECTIONS   traZODone (DESYREL) 100 MG tablet Take 1.5 tablets by mouth at bedtime.   vortioxetine HBr (TRINTELLIX) 20 MG TABS tablet Take 20 mg by mouth daily.   Current Facility-Administered Medications on File Prior to Visit  Medication   0.9 %  sodium chloride infusion   ipratropium-albuterol (DUONEB) 0.5-2.5 (3) MG/3ML nebulizer solution 3 mL    ROS: all negative except what is noted in the HPI.   Physical Exam:  BP 120/80   Pulse 73   Temp 98.5 F (36.9 C)   Ht 5\' 6"  (1.676 m)   Wt 142 lb 9.6 oz (64.7 kg)   LMP 03/12/2012   SpO2 99%   BMI 23.02 kg/m   General Appearance: NAD.  Awake, conversant and cooperative. Eyes: PERRLA, EOMs intact.  Sclera white.  Conjunctiva without erythema. Sinuses: No frontal/maxillary tenderness.  No nasal discharge. Nares patent.  ENT/Mouth: Ext aud canals clear.  Bilateral TMs w/DOL and without erythema or bulging. Hearing intact.  Posterior pharynx without swelling or exudate.  Tonsils without swelling or erythema.  Neck: Supple.  No masses, nodules or  thyromegaly. Respiratory: Effort is regular with non-labored breathing. Breath sounds are equal bilaterally without rales, rhonchi, wheezing or stridor.  Cardio: RRR with no MRGs. Brisk peripheral pulses without edema.  Abdomen: Active BS in all four quadrants.  Soft and non-tender without guarding, rebound tenderness, hernias or masses. Lymphatics: Non tender without lymphadenopathy.  Musculoskeletal: Full ROM, 5/5 strength, normal ambulation.  No clubbing or cyanosis. Skin: Appropriate color for ethnicity. Warm without rashes, lesions, ecchymosis, ulcers.  Neuro: CN II-XII grossly normal. Normal muscle tone without cerebellar symptoms and intact sensation.   Psych: AO X 3,  appropriate mood and affect, insight and judgment.     Adela Glimpse, NP 10:53 AM [facility]

## 2023-08-05 NOTE — Patient Instructions (Signed)
Refrain from taking further NSAIDs May take Acetaminophen x1000 mg four times a day to treat pain.   Increase Pantopraozle to 40 mg twice a day Take new medication Carafate to help with symptoms of ulcer.   Peptic Ulcer  A peptic ulcer is a painful sore in the lining of your stomach or the first part of your small intestine. What are the causes? Common causes of this condition include: An infection. Using certain pain medicines too often or too much. Rare tumors in the stomach, small intestine, or pancreas. What increases the risk? You are more likely to get this condition if you: Smoke. Have a family history of ulcer disease. Drink alcohol. Have been hospitalized in an intensive care unit (ICU). What are the signs or symptoms? Symptoms include: Burning pain in the area between the chest and the belly button. The pain may: Not go away (be persistent). Be worse when your stomach is empty. Be worse at night. Heartburn. Feeling sick to your stomach (nauseous) and throwing up (vomiting). Bloating. If the ulcer results in bleeding, it can cause you to: Have poop (stool) that is black and looks like tar. Throw up bright red blood. Throw up material that looks like coffee grounds. How is this treated? Treatment for this condition may include: Stopping things that can cause the ulcer, such as: Smoking. Using pain medicines. Drinking alcohol or caffeine. Medicines to reduce stomach acid. Antibiotic medicines if the ulcer is caused by an infection. A procedure that is done using a small, flexible tube that has a camera at the end (upper endoscopy). This may be done if you have a bleeding ulcer. Surgery. This may be needed if: You have a lot of bleeding. The ulcer caused a hole somewhere in the digestive system. Follow these instructions at home: Do not drink alcohol if your doctor tells you not to drink. Limit how much caffeine you take in. Do not smoke or use any products that  contain nicotine or tobacco. If you need help quitting, ask your doctor. Take over-the-counter and prescription medicines only as told by your doctor. Do not stop or change your medicines unless you talk with your doctor about it first. Do not take aspirin, ibuprofen, or other NSAIDs unless your doctor told you to do so. Keep all follow-up visits. Contact a doctor if: You do not get better in 7 days after you start treatment. You keep having an upset stomach (indigestion) or heartburn. Get help right away if: You have sudden, sharp pain in your belly (abdomen). You have belly pain that does not go away. You have bloody poop (stool) or black, tarry poop. You throw up blood. It may look like coffee grounds. You feel light-headed or feel like you may pass out (faint). You get weak. You get sweaty or feel sticky and cold to the touch (clammy). These symptoms may be an emergency. Get help right away. Call 911. Do not wait to see if the symptoms will go away. Do not drive yourself to the hospital. Summary Symptoms of a peptic ulcer include burning pain in the area between the chest and the belly button. Do not smoke or use any products that contain nicotine or tobacco. If you need help quitting, ask your doctor. Take medicines only as told by your doctor. Limit how much alcohol and caffeine you have. Keep all follow-up visits. This information is not intended to replace advice given to you by your health care provider. Make sure you discuss any questions  you have with your health care provider. Document Revised: 04/10/2021 Document Reviewed: 04/10/2021 Elsevier Patient Education  2024 [employer].

## 2023-08-06 LAB — COMPLETE METABOLIC PANEL WITH GFR
AG Ratio: 1.8 (calc) (ref 1.0–2.5)
ALT: 10 U/L (ref 6–29)
AST: 11 U/L (ref 10–35)
Albumin: 4.5 g/dL (ref 3.6–5.1)
Alkaline phosphatase (APISO): 61 U/L (ref 37–153)
BUN: 18 mg/dL (ref 7–25)
CO2: 28 mmol/L (ref 20–32)
Calcium: 9.7 mg/dL (ref 8.6–10.4)
Chloride: 102 mmol/L (ref 98–110)
Creat: 0.75 mg/dL (ref 0.50–1.05)
Globulin: 2.5 g/dL (ref 1.9–3.7)
Glucose, Bld: 95 mg/dL (ref 65–99)
Potassium: 4.9 mmol/L (ref 3.5–5.3)
Sodium: 139 mmol/L (ref 135–146)
Total Bilirubin: 0.3 mg/dL (ref 0.2–1.2)
Total Protein: 7 g/dL (ref 6.1–8.1)
eGFR: 91 mL/min/{1.73_m2} (ref >=60)

## 2023-08-06 LAB — CBC WITH DIFFERENTIAL/PLATELET
Absolute Lymphocytes: 1179 {cells}/uL (ref 850–3900)
Absolute Monocytes: 456 {cells}/uL (ref 200–950)
Basophils Absolute: 67 {cells}/uL (ref 0–200)
Basophils Relative: 1 %
Eosinophils Absolute: 107 {cells}/uL (ref 15–500)
Eosinophils Relative: 1.6 %
HCT: 39.7 % (ref 35.0–45.0)
Hemoglobin: 13.1 g/dL (ref 11.7–15.5)
MCH: 31.3 pg (ref 27.0–33.0)
MCHC: 33 g/dL (ref 32.0–36.0)
MCV: 95 fL (ref 80.0–100.0)
MPV: 10.6 fL (ref 7.5–12.5)
Monocytes Relative: 6.8 %
Neutro Abs: 4891 {cells}/uL (ref 1500–7800)
Neutrophils Relative %: 73 %
Platelets: 464 10*3/uL — ABNORMAL HIGH (ref 140–400)
RBC: 4.18 10*6/uL (ref 3.80–5.10)
RDW: 11.8 % (ref 11.0–15.0)
Total Lymphocyte: 17.6 %
WBC: 6.7 10*3/uL (ref 3.8–10.8)

## 2023-08-08 ENCOUNTER — Other Ambulatory Visit: Payer: Self-pay | Admitting: Nurse Practitioner

## 2023-08-08 DIAGNOSIS — R7989 Other specified abnormal findings of blood chemistry: Secondary | ICD-10-CM

## 2023-08-10 LAB — H. PYLORI BREATH TEST: H. pylori Breath Test: NOT DETECTED

## 2023-08-14 DIAGNOSIS — Z419 Encounter for procedure for purposes other than remedying health state, unspecified: Secondary | ICD-10-CM | POA: Diagnosis not present

## 2023-08-19 ENCOUNTER — Ambulatory Visit (INDEPENDENT_AMBULATORY_CARE_PROVIDER_SITE_OTHER): Admitting: Nurse Practitioner

## 2023-08-19 ENCOUNTER — Encounter: Payer: Self-pay | Admitting: Nurse Practitioner

## 2023-08-19 VITALS — BP 102/68 | HR 80 | Temp 98.4°F | Ht 66.0 in | Wt 143.2 lb

## 2023-08-19 DIAGNOSIS — K219 Gastro-esophageal reflux disease without esophagitis: Secondary | ICD-10-CM | POA: Diagnosis not present

## 2023-08-19 DIAGNOSIS — F439 Reaction to severe stress, unspecified: Secondary | ICD-10-CM

## 2023-08-19 DIAGNOSIS — R040 Epistaxis: Secondary | ICD-10-CM

## 2023-08-19 DIAGNOSIS — R1084 Generalized abdominal pain: Secondary | ICD-10-CM | POA: Diagnosis not present

## 2023-08-19 DIAGNOSIS — Z791 Long term (current) use of non-steroidal anti-inflammatories (NSAID): Secondary | ICD-10-CM

## 2023-08-19 DIAGNOSIS — R110 Nausea: Secondary | ICD-10-CM | POA: Diagnosis not present

## 2023-08-19 DIAGNOSIS — K253 Acute gastric ulcer without hemorrhage or perforation: Secondary | ICD-10-CM | POA: Diagnosis not present

## 2023-08-19 MED ORDER — SUCRALFATE 1 GM/10ML PO SUSP
1.0000 g | Freq: Three times a day (TID) | ORAL | 0 refills | Status: DC
Start: 2023-08-19 — End: 2023-09-08

## 2023-08-19 NOTE — Progress Notes (Signed)
Assessment and Plan:  Bonnie Hall was seen today for an episodic visit.  Diagnoses and all order for this visit:  Generalized abdominal pain Improving - discussed abd CT Scan to assess for underlying etiology if s/s fail to improve.  NSAID and stress induced ulcer - H. Pylori Negative Monitor for increase fever chills, melena.  Nausea Consume bland foods and clear liquids - advance as tolerated. Monitor for increase in vomiting or hematemesis.  Gastroesophageal reflux disease, unspecified whether esophagitis present Improving Continue Pantoprazole to BID Continue Carafate as directed- refilled Lifestyle modification:  avoid meals 2-3h before bedtime. Consider eliminating food triggers:  chocolate, caffeine, EtOH, acid/spicy food.  Acute gastric ulcer without hemorrhage or perforation  - sucralfate (CARAFATE) 1 GM/10ML suspension; Take 10 mLs (1 g total) by mouth 4 (four) times daily -  with meals and at bedtime. Take 1 hour before meals and at bedtime for 4 weeks.  Dispense: 414 mL; Refill: 0  NSAID long-term use Discussed risk for long term NSAID use and increase in ulcer formation. Stop use of NSAID - replace with Acetaminophen - can take 1,000 mg QID  Stress at home Continue to work on self care and reducing stress.     Notify office for further evaluation and treatment, questions or concerns if any reported s/s fail to improve.   The patient was advised to call back or seek an in-person evaluation if any symptoms worsen or if the condition fails to improve as anticipated.   Further disposition pending results of labs. Discussed med's effects and SE's.    I discussed the assessment and treatment plan with the patient. The patient was provided an opportunity to ask questions and all were answered. The patient agreed with the plan and demonstrated an understanding of the instructions.  Discussed med's effects and SE's. Screening labs and tests as requested with  regular follow-up as recommended.  I provided 20 minutes of face-to-face time during this encounter including counseling, chart review, and critical decision making was preformed.  Today's Plan of Care is based on a patient-centered health care approach known as shared decision making - the decisions, tests and treatments allow for patient preferences and values to be balanced with clinical evidence.     Future Appointments  Date Time Provider Department Center  09/29/2023  9:00 AM Kemal Amores, Archie Patten, NP [facility] None    ------------------------------------------------------------------------------------------------------------------   HPI BP 102/68   Pulse 80   Temp 98.4 F (36.9 C)   Ht 5\' 6"  (1.676 m)   Wt 143 lb 3.2 oz (65 kg)   LMP 03/12/2012   SpO2 97%   BMI 23.11 kg/m    Bonnie Hall is a 62 y.o. female who presents for a follow up on improving burning, dull, sharp, and shooting pain located in in the entire abdomen. Symptoms have been present for 3 months but she now reports they are 50% better.  H. Pylori test negative. Patient denies blood in stool, constipation, dark urine, fever, hematemesis, and melena. Patient's oral intake has been decreased for liquids and decreased for solids. Patient's urine output has been adequate.  Patient denies recent travel history. Patient has not had recent ingestion of possible contaminated food, toxic plants, or inappropriate medications/poisons. Reports eating makes stomach feel better.  She does report long term NSAID use, daily, takes IBU 1600 mg daily with Excedrin but has limited used of these over the last 2 weeks.  Takes to help treat generalized pain and migraine.  Also takes  Fioricet.  She has Meloxicam prescribed but reports does not take daily.  She also has Pantoprazole 40 mg that she takes daily.  Associated epistaxis in the AM, mild, well controlled.  Carafate has helped to control symptoms.  Past Medical History:   Diagnosis Date   Allergy    Hay fever/exercise induced asthma   AMA (advanced maternal age) multigravida 35+    Anxiety    on meds   Arthritis    generalized   Asthma    Blood transfusion 1990's   Depression    bipolar=on meds   GERD (gastroesophageal reflux disease)    on meds   Hyperlipidemia    on meds   Iron deficiency anemia 1990's   negative work up as to etiology; required iron infusion, blood transfusion.  [facility] hematologist  at [facility].    Left-sided Bell's palsy 02/03/2021   Migraine    Newborn product of in vitro fertilization (IVF) pregnancy    Pregnancy induced hypertension    on meds   Preterm labor    Sleep apnea    no CPAP   Thrombocytosis    Vaginal Pap smear, abnormal      No Known Allergies  Current Outpatient Medications on File Prior to Visit  Medication Sig   ALPRAZolam (XANAX) 1 MG tablet Take 1 mg by mouth at bedtime as needed.   ARIPiprazole (ABILIFY) 10 MG tablet Take 10 mg by mouth daily.   aspirin EC 81 MG tablet Take 1 tablet (81 mg total) by mouth daily. (Patient taking differently: Take 81 mg by mouth at bedtime.)   butalbital-acetaminophen-caffeine (FIORICET) 50-325-40 MG tablet TAKE 1 TO 2 TABLETS EVERY 6 HOURS AS NEEDED ONSET OF HEADACHE. MAX 6 TABS PER EVENT. MAX 30 A MONTH   clonazePAM (KLONOPIN) 1 MG tablet Take by mouth.   DimenhyDRINATE (MOTION SICKNESS RELIEF PO) Take 1-2 tablets by mouth at bedtime as needed (motion sickness).   erythromycin ophthalmic ointment Apply to eye.   lamoTRIgine (LAMICTAL) 200 MG tablet Take 200 mg by mouth at bedtime.   Lurasidone HCl 60 MG TABS Take 60 mg by mouth at bedtime.   meloxicam (MOBIC) 7.5 MG tablet Take 1 tablet (7.5 mg total) by mouth daily.   montelukast (SINGULAIR) 10 MG tablet TAKE ONE TABLET BY MOUTH EVERY DAY AT BEDTIME   pantoprazole (PROTONIX) 40 MG tablet Take 1 tablet (40 mg total) by mouth 2 (two) times daily.   promethazine (PHENERGAN) 25 MG tablet Take 1 tablet (25 mg total) by  mouth every 6 (six) hours as needed for nausea or vomiting (can cause fatigue). Max: 4 tablets per day   promethazine-dextromethorphan (PROMETHAZINE-DM) 6.25-15 MG/5ML syrup Take 5 mLs by mouth 4 (four) times daily as needed for cough.   SODIUM FLUORIDE 5000 SENSITIVE 1.1-5 % GEL PLEASE SEE ATTACHED FOR DETAILED DIRECTIONS   traZODone (DESYREL) 100 MG tablet Take 1.5 tablets by mouth at bedtime.   vortioxetine HBr (TRINTELLIX) 20 MG TABS tablet Take 20 mg by mouth daily.   Current Facility-Administered Medications on File Prior to Visit  Medication   0.9 %  sodium chloride infusion   ipratropium-albuterol (DUONEB) 0.5-2.5 (3) MG/3ML nebulizer solution 3 mL    ROS: all negative except what is noted in the HPI.   Physical Exam:  BP 102/68   Pulse 80   Temp 98.4 F (36.9 C)   Ht 5\' 6"  (1.676 m)   Wt 143 lb 3.2 oz (65 kg)   LMP 03/12/2012  SpO2 97%   BMI 23.11 kg/m   General Appearance: NAD.  Awake, conversant and cooperative. Eyes: PERRLA, EOMs intact.  Sclera white.  Conjunctiva without erythema. Sinuses: No frontal/maxillary tenderness.  No nasal discharge. Nares patent.  ENT/Mouth: Ext aud canals clear.  Bilateral TMs w/DOL and without erythema or bulging. Hearing intact.  Posterior pharynx without swelling or exudate.  Tonsils without swelling or erythema.  Neck: Supple.  No masses, nodules or thyromegaly. Respiratory: Effort is regular with non-labored breathing. Breath sounds are equal bilaterally without rales, rhonchi, wheezing or stridor.  Cardio: RRR with no MRGs. Brisk peripheral pulses without edema.  Abdomen: Active BS in all four quadrants.  Soft and non-tender without guarding, rebound tenderness, hernias or masses. Lymphatics: Non tender without lymphadenopathy.  Musculoskeletal: Full ROM, 5/5 strength, normal ambulation.  No clubbing or cyanosis. Skin: Appropriate color for ethnicity. Warm without rashes, lesions, ecchymosis, ulcers.  Neuro: CN II-XII grossly  normal. Normal muscle tone without cerebellar symptoms and intact sensation.   Psych: AO X 3,  appropriate mood and affect, insight and judgment.     Adela Glimpse, NP 12:58 PM [facility]

## 2023-08-19 NOTE — Patient Instructions (Signed)
 Abdominal Pain, Adult    Many things can cause belly (abdominal) pain. In most cases, belly pain is not a serious problem and can be watched and treated at home. But in some cases, it can be serious.  Your doctor will try to find the cause of your belly pain.  Follow these instructions at home:  Medicines  Take over-the-counter and prescription medicines only as told by your doctor.  Do not take medicines that help you poop (laxatives) unless told by your doctor.  General instructions  Watch your belly pain for any changes. Tell your doctor if the pain gets worse.  Drink enough fluid to keep your pee (urine) pale yellow.  Contact a doctor if:  Your belly pain changes or gets worse.  You have very bad cramping or bloating in your belly.  You vomit.  Your pain gets worse with meals, after eating, or with certain foods.  You have trouble pooping or have watery poop for more than 2-3 days.  You are not hungry, or you lose weight without trying.  You have signs of not getting enough fluid or water (dehydration). These may include:  Dark pee, very little pee, or no pee.  Cracked lips or dry mouth.  Feeling sleepy or weak.  You have pain when you pee or poop.  Your belly pain wakes you up at night.  You have blood in your pee.  You have a fever.  Get help right away if:  You cannot stop vomiting.  Your pain is only in one part of your belly, like on the right side.  You have bloody or black poop, or poop that looks like tar.  You have trouble breathing.  You have chest pain.  These symptoms may be an emergency. Get help right away. Call 911.  Do not wait to see if the symptoms will go away.  Do not drive yourself to the hospital.  This information is not intended to replace advice given to you by your health care provider. Make sure you discuss any questions you have with your health care provider.  Document Revised: 06/16/2022 Document Reviewed: 06/16/2022  Elsevier Patient Education  2024 [employer].

## 2023-08-28 ENCOUNTER — Other Ambulatory Visit: Payer: Self-pay | Admitting: Nurse Practitioner

## 2023-08-28 DIAGNOSIS — K219 Gastro-esophageal reflux disease without esophagitis: Secondary | ICD-10-CM

## 2023-09-05 ENCOUNTER — Other Ambulatory Visit: Payer: Self-pay

## 2023-09-05 ENCOUNTER — Telehealth: Payer: Self-pay

## 2023-09-05 DIAGNOSIS — K253 Acute gastric ulcer without hemorrhage or perforation: Secondary | ICD-10-CM

## 2023-09-05 NOTE — Telephone Encounter (Signed)
Refill request for Sulcrafate suspension. Ok to fill?

## 2023-09-08 ENCOUNTER — Other Ambulatory Visit: Payer: Self-pay | Admitting: Nurse Practitioner

## 2023-09-08 DIAGNOSIS — K253 Acute gastric ulcer without hemorrhage or perforation: Secondary | ICD-10-CM

## 2023-09-08 MED ORDER — SUCRALFATE 1 GM/10ML PO SUSP: 1.0000 g | Freq: Two times a day (BID) | ORAL | 0 refills | Status: DC

## 2023-09-08 NOTE — Telephone Encounter (Signed)
They want a medicaid provider to prescribe.  WE are not medicaid certified.  She will have to pay out of pocket or switch to a medicaid provider

## 2023-09-14 DIAGNOSIS — Z419 Encounter for procedure for purposes other than remedying health state, unspecified: Secondary | ICD-10-CM | POA: Diagnosis not present

## 2023-09-15 DIAGNOSIS — F3132 Bipolar disorder, current episode depressed, moderate: Secondary | ICD-10-CM | POA: Diagnosis not present

## 2023-09-15 DIAGNOSIS — F430 Acute stress reaction: Secondary | ICD-10-CM | POA: Diagnosis not present

## 2023-09-16 DIAGNOSIS — G245 Blepharospasm: Secondary | ICD-10-CM | POA: Diagnosis not present

## 2023-09-19 DIAGNOSIS — F430 Acute stress reaction: Secondary | ICD-10-CM | POA: Diagnosis not present

## 2023-09-19 DIAGNOSIS — F3132 Bipolar disorder, current episode depressed, moderate: Secondary | ICD-10-CM | POA: Diagnosis not present

## 2023-09-22 DIAGNOSIS — R1084 Generalized abdominal pain: Secondary | ICD-10-CM | POA: Diagnosis not present

## 2023-09-22 DIAGNOSIS — K253 Acute gastric ulcer without hemorrhage or perforation: Secondary | ICD-10-CM | POA: Diagnosis not present

## 2023-09-22 DIAGNOSIS — K219 Gastro-esophageal reflux disease without esophagitis: Secondary | ICD-10-CM | POA: Diagnosis not present

## 2023-09-22 DIAGNOSIS — R110 Nausea: Secondary | ICD-10-CM | POA: Diagnosis not present

## 2023-09-23 DIAGNOSIS — F3181 Bipolar II disorder: Secondary | ICD-10-CM | POA: Diagnosis not present

## 2023-09-23 DIAGNOSIS — F4329 Adjustment disorder with other symptoms: Secondary | ICD-10-CM | POA: Diagnosis not present

## 2023-09-23 DIAGNOSIS — G4700 Insomnia, unspecified: Secondary | ICD-10-CM | POA: Diagnosis not present

## 2023-09-23 DIAGNOSIS — Z79899 Other long term (current) drug therapy: Secondary | ICD-10-CM | POA: Diagnosis not present

## 2023-09-24 ENCOUNTER — Other Ambulatory Visit: Payer: Self-pay | Admitting: Nurse Practitioner

## 2023-09-24 DIAGNOSIS — K253 Acute gastric ulcer without hemorrhage or perforation: Secondary | ICD-10-CM

## 2023-09-26 ENCOUNTER — Ambulatory Visit: Admitting: Nurse Practitioner

## 2023-09-26 DIAGNOSIS — F3132 Bipolar disorder, current episode depressed, moderate: Secondary | ICD-10-CM | POA: Diagnosis not present

## 2023-09-26 DIAGNOSIS — F430 Acute stress reaction: Secondary | ICD-10-CM | POA: Diagnosis not present

## 2023-09-29 ENCOUNTER — Encounter: Admitting: Nurse Practitioner

## 2023-10-05 DIAGNOSIS — F3132 Bipolar disorder, current episode depressed, moderate: Secondary | ICD-10-CM | POA: Diagnosis not present

## 2023-10-05 DIAGNOSIS — F430 Acute stress reaction: Secondary | ICD-10-CM | POA: Diagnosis not present

## 2023-10-07 DIAGNOSIS — G510 Bell's palsy: Secondary | ICD-10-CM | POA: Diagnosis not present

## 2023-10-14 ENCOUNTER — Ambulatory Visit: Admitting: Nurse Practitioner

## 2023-10-14 DIAGNOSIS — G510 Bell's palsy: Secondary | ICD-10-CM | POA: Diagnosis not present

## 2023-10-15 DIAGNOSIS — Z419 Encounter for procedure for purposes other than remedying health state, unspecified: Secondary | ICD-10-CM | POA: Diagnosis not present

## 2023-10-17 DIAGNOSIS — F3132 Bipolar disorder, current episode depressed, moderate: Secondary | ICD-10-CM | POA: Diagnosis not present

## 2023-10-17 DIAGNOSIS — F430 Acute stress reaction: Secondary | ICD-10-CM | POA: Diagnosis not present

## 2023-10-18 ENCOUNTER — Encounter: Admitting: Nurse Practitioner

## 2023-10-21 DIAGNOSIS — R059 Cough, unspecified: Secondary | ICD-10-CM | POA: Diagnosis not present

## 2023-10-21 DIAGNOSIS — K529 Noninfective gastroenteritis and colitis, unspecified: Secondary | ICD-10-CM | POA: Diagnosis not present

## 2023-10-21 DIAGNOSIS — J029 Acute pharyngitis, unspecified: Secondary | ICD-10-CM | POA: Diagnosis not present

## 2023-10-21 DIAGNOSIS — J069 Acute upper respiratory infection, unspecified: Secondary | ICD-10-CM | POA: Diagnosis not present

## 2023-10-23 ENCOUNTER — Emergency Department (HOSPITAL_COMMUNITY)

## 2023-10-23 ENCOUNTER — Emergency Department (HOSPITAL_COMMUNITY)
Admission: EM | Admit: 2023-10-23 | Discharge: 2023-10-23 | Disposition: A | Discharge status: Home / Self Care | Attending: Emergency Medicine | Admitting: Emergency Medicine

## 2023-10-23 DIAGNOSIS — Z79899 Other long term (current) drug therapy: Secondary | ICD-10-CM | POA: Insufficient documentation

## 2023-10-23 DIAGNOSIS — J45909 Unspecified asthma, uncomplicated: Secondary | ICD-10-CM | POA: Diagnosis not present

## 2023-10-23 DIAGNOSIS — R109 Unspecified abdominal pain: Secondary | ICD-10-CM

## 2023-10-23 DIAGNOSIS — Z7982 Long term (current) use of aspirin: Secondary | ICD-10-CM | POA: Insufficient documentation

## 2023-10-23 DIAGNOSIS — I10 Essential (primary) hypertension: Secondary | ICD-10-CM | POA: Diagnosis not present

## 2023-10-23 DIAGNOSIS — R0902 Hypoxemia: Secondary | ICD-10-CM | POA: Diagnosis not present

## 2023-10-23 DIAGNOSIS — K529 Noninfective gastroenteritis and colitis, unspecified: Secondary | ICD-10-CM | POA: Diagnosis not present

## 2023-10-23 DIAGNOSIS — R1084 Generalized abdominal pain: Secondary | ICD-10-CM | POA: Diagnosis not present

## 2023-10-23 DIAGNOSIS — R0789 Other chest pain: Secondary | ICD-10-CM | POA: Diagnosis not present

## 2023-10-23 DIAGNOSIS — R112 Nausea with vomiting, unspecified: Secondary | ICD-10-CM | POA: Diagnosis not present

## 2023-10-23 DIAGNOSIS — R1013 Epigastric pain: Secondary | ICD-10-CM | POA: Diagnosis not present

## 2023-10-23 DIAGNOSIS — N3289 Other specified disorders of bladder: Secondary | ICD-10-CM | POA: Diagnosis not present

## 2023-10-23 HISTORY — DX: Noninfective gastroenteritis and colitis, unspecified: K52.9

## 2023-10-23 LAB — CBC
HCT: 39.2 % (ref 36.0–46.0)
Hemoglobin: 12.5 g/dL (ref 12.0–15.0)
MCH: 31 pg (ref 26.0–34.0)
MCHC: 31.9 g/dL (ref 30.0–36.0)
MCV: 97.3 fL (ref 80.0–100.0)
Platelets: 338 10*3/uL (ref 150–400)
RBC: 4.03 MIL/uL (ref 3.87–5.11)
RDW: 14.5 % (ref 11.5–15.5)
WBC: 17.7 10*3/uL — ABNORMAL HIGH (ref 4.0–10.5)
nRBC: 0 % (ref 0.0–0.2)

## 2023-10-23 LAB — COMPREHENSIVE METABOLIC PANEL
ALT: 13 U/L (ref 0–44)
AST: 14 U/L — ABNORMAL LOW (ref 15–41)
Albumin: 4 g/dL (ref 3.5–5.0)
Alkaline Phosphatase: 46 U/L (ref 38–126)
Anion gap: 9 (ref 5–15)
BUN: 19 mg/dL (ref 8–23)
CO2: 25 mmol/L (ref 22–32)
Calcium: 9.1 mg/dL (ref 8.9–10.3)
Chloride: 97 mmol/L — ABNORMAL LOW (ref 98–111)
Creatinine, Ser: 0.89 mg/dL (ref 0.44–1.00)
GFR, Estimated: >60 mL/min (ref >60)
Glucose, Bld: 108 mg/dL — ABNORMAL HIGH (ref 70–99)
Potassium: 3.5 mmol/L (ref 3.5–5.1)
Sodium: 131 mmol/L — ABNORMAL LOW (ref 135–145)
Total Bilirubin: 0.7 mg/dL (ref 0.0–1.2)
Total Protein: 7 g/dL (ref 6.5–8.1)

## 2023-10-23 LAB — LIPASE, BLOOD: Lipase: 26 U/L (ref 11–51)

## 2023-10-23 LAB — URINALYSIS, ROUTINE W REFLEX MICROSCOPIC
Bacteria, UA: NONE SEEN
Bilirubin Urine: NEGATIVE
Glucose, UA: NEGATIVE mg/dL
Ketones, ur: NEGATIVE mg/dL
Leukocytes,Ua: NEGATIVE
Nitrite: NEGATIVE
Protein, ur: NEGATIVE mg/dL
Specific Gravity, Urine: 1.02 (ref 1.005–1.030)
pH: 5 (ref 5.0–8.0)

## 2023-10-23 LAB — TROPONIN I (HIGH SENSITIVITY): Troponin I (High Sensitivity): 3 ng/L (ref <18)

## 2023-10-23 LAB — I-STAT CG4 LACTIC ACID, ED: Lactic Acid, Venous: 0.6 mmol/L (ref 0.5–1.9)

## 2023-10-23 MED ORDER — SODIUM CHLORIDE 0.9 % IV BOLUS
1000.0000 mL | Freq: Once | INTRAVENOUS | Status: AC
  Administered 2023-10-23: 1000 mL via INTRAVENOUS

## 2023-10-23 MED ORDER — ONDANSETRON 4 MG PO TBDP
4.0000 mg | ORAL_TABLET | Freq: Once | ORAL | Status: AC
  Administered 2023-10-23: 4 mg via ORAL
  Filled 2023-10-23: qty 1

## 2023-10-23 MED ORDER — OXYCODONE-ACETAMINOPHEN 5-325 MG PO TABS
1.0000 | ORAL_TABLET | Freq: Once | ORAL | Status: AC
  Administered 2023-10-23: 1 via ORAL
  Filled 2023-10-23: qty 1

## 2023-10-23 MED ORDER — FENTANYL CITRATE PF 50 MCG/ML IJ SOSY
50.0000 ug | PREFILLED_SYRINGE | Freq: Once | INTRAMUSCULAR | Status: AC
  Administered 2023-10-23: 50 ug via INTRAVENOUS
  Filled 2023-10-23: qty 1

## 2023-10-23 MED ORDER — AMOXICILLIN-POT CLAVULANATE 875-125 MG PO TABS
1.0000 | ORAL_TABLET | Freq: Once | ORAL | Status: AC
  Administered 2023-10-23: 1 via ORAL
  Filled 2023-10-23: qty 1

## 2023-10-23 MED ORDER — LIDOCAINE VISCOUS HCL 2 % MT SOLN
15.0000 mL | Freq: Once | OROMUCOSAL | Status: DC
  Filled 2023-10-23: qty 15

## 2023-10-23 MED ORDER — IOHEXOL 300 MG/ML  SOLN
100.0000 mL | Freq: Once | INTRAMUSCULAR | Status: AC | PRN
  Administered 2023-10-23: 100 mL via INTRAVENOUS

## 2023-10-23 MED ORDER — ALUM & MAG HYDROXIDE-SIMETH 200-200-20 MG/5ML PO SUSP
30.0000 mL | Freq: Once | ORAL | Status: AC
  Administered 2023-10-23: 30 mL via ORAL
  Filled 2023-10-23: qty 30

## 2023-10-23 MED ORDER — AMOXICILLIN-POT CLAVULANATE 875-125 MG PO TABS: 1.0000 | ORAL_TABLET | Freq: Two times a day (BID) | ORAL | 0 refills | Status: AC

## 2023-10-23 MED ORDER — ONDANSETRON HCL 4 MG/2ML IJ SOLN
4.0000 mg | Freq: Once | INTRAMUSCULAR | Status: AC
  Administered 2023-10-23: 4 mg via INTRAVENOUS
  Filled 2023-10-23: qty 2

## 2023-10-23 MED ORDER — OXYCODONE-ACETAMINOPHEN 5-325 MG PO TABS: 1.0000 | ORAL_TABLET | ORAL | 0 refills | Status: DC | PRN

## 2023-10-23 MED ORDER — ONDANSETRON 4 MG PO TBDP: 4.0000 mg | ORAL_TABLET | Freq: Three times a day (TID) | ORAL | 0 refills | Status: DC | PRN

## 2023-10-23 NOTE — ED Provider Notes (Signed)
 [facility] AT [facility] Provider Note   CSN: [id number] Arrival date & time: 10/23/23  1635     History  Chief Complaint  Patient presents with   Abdominal Pain   Nausea   Chest Pain    Bonnie Hall is a 63 y.o. female.  The history is provided by the patient and medical records. No language interpreter was used.  Abdominal Pain Pain location:  Generalized Pain quality: aching, cramping and sharp   Pain radiates to:  Chest Pain severity:  Severe Onset quality:  Gradual Duration:  4 days (worse) Timing:  Constant Progression:  Waxing and waning Chronicity:  New Context: not trauma   Relieved by:  Nothing Worsened by:  Palpation and eating Ineffective treatments:  None tried Associated symptoms: chest pain, chills, diarrhea, fatigue and nausea   Associated symptoms: no constipation, no cough, no dysuria, no fever, no shortness of breath and no vomiting   Chest Pain Associated symptoms: abdominal pain, fatigue and nausea   Associated symptoms: no back pain, no cough, no diaphoresis, no dizziness, no fever, no headache, no palpitations, no shortness of breath and no vomiting        Home Medications Prior to Admission medications   Medication Sig Start Date End Date Taking? Authorizing Provider  ALPRAZolam (XANAX) 1 MG tablet Take 1 mg by mouth at bedtime as needed.    [provider]  ARIPiprazole (ABILIFY) 10 MG tablet Take 10 mg by mouth daily.    [provider]  aspirin  EC 81 MG tablet Take 1 tablet (81 mg total) by mouth daily. Patient taking differently: Take 81 mg by mouth at bedtime. 01/16/15   Tonita Fallow, MD  butalbital -acetaminophen -caffeine  (FIORICET) 50-325-40 MG tablet TAKE 1 TO 2 TABLETS EVERY 6 HOURS AS NEEDED ONSET OF HEADACHE. MAX 6 TABS PER EVENT. MAX 30 A MONTH 09/28/22   Cranford, Tonya, NP  clonazePAM (KLONOPIN) 1 MG tablet Take by mouth. 05/14/23 08/19/23  [provider]   DimenhyDRINATE (MOTION SICKNESS RELIEF PO) Take 1-2 tablets by mouth at bedtime as needed (motion sickness).    [provider]  erythromycin  ophthalmic ointment Apply to eye. 06/10/23   [provider]  lamoTRIgine  (LAMICTAL ) 200 MG tablet Take 200 mg by mouth at bedtime.    [provider]  Lurasidone  HCl 60 MG TABS Take 60 mg by mouth at bedtime.    [provider]  meloxicam  (MOBIC ) 7.5 MG tablet Take 1 tablet (7.5 mg total) by mouth daily. 04/13/23   Dolphus Reiter, MD  montelukast  (SINGULAIR ) 10 MG tablet TAKE ONE TABLET BY MOUTH EVERY DAY AT BEDTIME 01/11/23   Wilkinson, Dana E, NP  pantoprazole  (PROTONIX ) 40 MG tablet TAKE 1 TABLET BY MOUTH TWICE A DAY 08/29/23   Wilkinson, Dana E, NP  promethazine  (PHENERGAN ) 25 MG tablet Take 1 tablet (25 mg total) by mouth every 6 (six) hours as needed for nausea or vomiting (can cause fatigue). Max: 4 tablets per day 01/15/21   Jeanine Knee, NP  promethazine -dextromethorphan (PROMETHAZINE -DM) 6.25-15 MG/5ML syrup Take 5 mLs by mouth 4 (four) times daily as needed for cough. 09/09/22   Wilkinson, Dana E, NP  SODIUM FLUORIDE 5000 SENSITIVE 1.1-5 % GEL PLEASE SEE ATTACHED FOR DETAILED DIRECTIONS 02/12/23   [provider]  sucralfate  (CARAFATE ) 1 GM/10ML suspension Take 10 mLs (1 g total) by mouth 2 (two) times daily. 09/08/23   Wilkinson, Dana E, NP  traZODone (DESYREL) 100 MG tablet Take 1.5 tablets  by mouth at bedtime. 06/16/22 08/19/23  [provider]  vortioxetine HBr (TRINTELLIX) 20 MG TABS tablet Take 20 mg by mouth daily.    [provider]      Allergies    Patient has no known allergies.    Review of Systems   Review of Systems  Constitutional:  Positive for chills and fatigue. Negative for diaphoresis and fever.  HENT:  Negative for congestion.   Respiratory:  Negative for cough, chest tightness, shortness of breath and wheezing.   Cardiovascular:  Positive for chest pain.  Negative for palpitations and leg swelling.  Gastrointestinal:  Positive for abdominal pain, diarrhea and nausea. Negative for abdominal distention, constipation and vomiting.  Genitourinary:  Negative for dysuria, flank pain and frequency.  Musculoskeletal:  Negative for back pain, neck pain and neck stiffness.  Skin:  Negative for rash and wound.  Neurological:  Negative for dizziness, light-headedness and headaches.  Psychiatric/Behavioral:  Negative for agitation and confusion.   All other systems reviewed and are negative.   Physical Exam Updated Vital Signs BP 113/65 (BP Location: Right Arm)   Pulse 90   Temp 98.6 F (37 C) (Oral)   Resp 20   Ht 5' 6 (1.676 m)   Wt 63 kg   LMP 03/12/2012   SpO2 99%   BMI 22.44 kg/m  Physical Exam Vitals and nursing note reviewed.  Constitutional:      General: She is not in acute distress.    Appearance: She is well-developed. She is not ill-appearing, toxic-appearing or diaphoretic.  HENT:     Head: Normocephalic and atraumatic.     Mouth/Throat:     Mouth: Mucous membranes are dry.     Pharynx: No oropharyngeal exudate or posterior oropharyngeal erythema.  Eyes:     Extraocular Movements: Extraocular movements intact.     Conjunctiva/sclera: Conjunctivae normal.     Pupils: Pupils are equal, round, and reactive to light.  Cardiovascular:     Rate and Rhythm: Normal rate and regular rhythm.     Heart sounds: No murmur heard. Pulmonary:     Effort: Pulmonary effort is normal. No respiratory distress.     Breath sounds: Normal breath sounds. No wheezing, rhonchi or rales.  Chest:     Chest wall: No tenderness.  Abdominal:     General: Abdomen is flat. There is no distension.     Palpations: Abdomen is soft.     Tenderness: There is abdominal tenderness in the right upper quadrant, right lower quadrant and epigastric area. There is no right CVA tenderness, left CVA tenderness, guarding or rebound.  Musculoskeletal:         General: No swelling or tenderness.     Cervical back: Neck supple. No tenderness.     Right lower leg: No edema.     Left lower leg: No edema.  Skin:    General: Skin is warm and dry.     Capillary Refill: Capillary refill takes less than 2 seconds.     Findings: No erythema or rash.  Neurological:     General: No focal deficit present.     Mental Status: She is alert.  Psychiatric:        Mood and Affect: Mood normal.     ED Results / Procedures / Treatments   Labs (all labs ordered are listed, but only abnormal results are displayed) Labs Reviewed  COMPREHENSIVE METABOLIC PANEL - Abnormal; Notable for the following components:      Result  Value   Sodium 131 (*)    Chloride 97 (*)    Glucose, Bld 108 (*)    AST 14 (*)    All other components within normal limits  CBC - Abnormal; Notable for the following components:   WBC 17.7 (*)    All other components within normal limits  URINALYSIS, ROUTINE W REFLEX MICROSCOPIC - Abnormal; Notable for the following components:   Hgb urine dipstick MODERATE (*)    All other components within normal limits  LIPASE, BLOOD  I-STAT CG4 LACTIC ACID, ED  TROPONIN I (HIGH SENSITIVITY)    EKG EKG Interpretation Date/Time:  Sunday October 23 2023 17:11:15 EST Ventricular Rate:  91 PR Interval:  144 QRS Duration:  88 QT Interval:  348 QTC Calculation: 429 R Axis:   -30  Text Interpretation: Sinus rhythm Left axis deviation RSR' in V1 or V2, probably normal variant when compared to prior, similar appearance. No STEMI Confirmed by Ginger Barefoot ([id number]) on 10/23/2023 7:13:26 PM  Radiology DG Chest 2 View Result Date: 10/23/2023 CLINICAL DATA:  Generalized abdominal pain and chest pain with diarrhea and nausea. EXAM: CHEST - 2 VIEW COMPARISON:  Feb 03, 2017 FINDINGS: The heart size and mediastinal contours are within normal limits. Both lungs are clear. The visualized skeletal structures are unremarkable. IMPRESSION: No active  cardiopulmonary disease. Electronically Signed   By: Suzen Dials M.D.   On: 10/23/2023 20:04   CT ABDOMEN PELVIS W CONTRAST Result Date: 10/23/2023 CLINICAL DATA:  Abdominal pain. EXAM: CT ABDOMEN AND PELVIS WITH CONTRAST TECHNIQUE: Multidetector CT imaging of the abdomen and pelvis was performed using the standard protocol following bolus administration of intravenous contrast. RADIATION DOSE REDUCTION: This exam was performed according to the departmental dose-optimization program which includes automated exposure control, adjustment of the mA and/or kV according to patient size and/or use of iterative reconstruction technique. CONTRAST:  OMNIPAQUE  IOHEXOL  300 MG/ML  SOLN COMPARISON:  Jan 30, 2020 FINDINGS: Lower chest: No acute abnormality. Hepatobiliary: No focal liver abnormality is seen. No gallstones, gallbladder wall thickening, or biliary dilatation. Pancreas: Unremarkable. No pancreatic ductal dilatation or surrounding inflammatory changes. Spleen: Normal in size without focal abnormality. Adrenals/Urinary Tract: Adrenal glands are unremarkable. Kidneys are normal, without renal calculi, focal lesion, or hydronephrosis. The urinary bladder is poorly distended and subsequently limited in evaluation. Stomach/Bowel: Stomach is within normal limits. The appendix is not clearly identified. No evidence of bowel dilatation. Mild thickening of the cecum and ascending colon is noted. Vascular/Lymphatic: Aortic atherosclerosis. No enlarged abdominal or pelvic lymph nodes. Reproductive: Uterus and bilateral adnexa are unremarkable. Other: No abdominal wall hernia or abnormality. No abdominopelvic ascites. Musculoskeletal: There is stable grade 1 anterolisthesis of the L4 vertebral body on L5. No acute osseous abnormality is identified. IMPRESSION: 1. Mild thickening of the cecum and ascending colon which may represent sequelae associated with mild colitis. 2. Stable grade 1 anterolisthesis of the L4  vertebral body on L5. 3. Aortic atherosclerosis. Aortic Atherosclerosis (ICD10-I70.0). Electronically Signed   By: Suzen Dials M.D.   On: 10/23/2023 20:04   US  Abdomen Limited RUQ (LIVER/GB) Result Date: 10/23/2023 CLINICAL DATA:  Right upper quadrant abdominal pain EXAM: ULTRASOUND ABDOMEN LIMITED RIGHT UPPER QUADRANT COMPARISON:  CT abdomen pelvis 10/23/2023 FINDINGS: Gallbladder: No gallstones or wall thickening visualized. No sonographic Murphy sign noted by sonographer. Common bile duct: Diameter: 2.3 mm Liver: No focal lesion identified. Within normal limits in parenchymal echogenicity. Portal vein is patent on color Doppler imaging with normal direction of  blood flow towards the liver. Other: None. IMPRESSION: No cholelithiasis or sonographic evidence for acute cholecystitis. Electronically Signed   By: Bard Moats M.D.   On: 10/23/2023 20:01    Procedures Procedures    Medications Ordered in ED Medications  alum & mag hydroxide-simeth (MAALOX/MYLANTA) 200-200-20 MG/5ML suspension 30 mL (30 mLs Oral Given 10/23/23 2001)    And  lidocaine  (XYLOCAINE ) 2 % viscous mouth solution 15 mL (has no administration in time range)  fentaNYL  (SUBLIMAZE ) injection 50 mcg (50 mcg Intravenous Given 10/23/23 2001)  ondansetron  (ZOFRAN ) injection 4 mg (4 mg Intravenous Given 10/23/23 2001)  sodium chloride  0.9 % bolus 1,000 mL (0 mLs Intravenous Stopped 10/23/23 2236)  iohexol  (OMNIPAQUE ) 300 MG/ML solution 100 mL (100 mLs Intravenous Contrast Given 10/23/23 1929)  amoxicillin -clavulanate (AUGMENTIN ) 875-125 MG per tablet 1 tablet (1 tablet Oral Given 10/23/23 2241)  oxyCODONE -acetaminophen  (PERCOCET/ROXICET) 5-325 MG per tablet 1 tablet (1 tablet Oral Given 10/23/23 2241)  ondansetron  (ZOFRAN -ODT) disintegrating tablet 4 mg (4 mg Oral Given 10/23/23 2241)    ED Course/ Medical Decision Making/ A&P                                 Medical Decision Making Amount and/or Complexity of Data Reviewed Labs:  ordered. Radiology: ordered.  Risk OTC drugs. Prescription drug management.    TENNILE STYLES is a 63 y.o. female with a past medical history significant for GERD, hypertension, migraines, hyperlipidemia, and asthma who presents with with abdominal discomfort, chest discomfort, shortness of breath, nausea, diarrhea, chills, and fatigue.  According to patient, for the last month or so she has had some on and off discomfort in her epigastric area and abdomen.  She reports that it been going to her chest.  She thinks it is an ulcer and has had pain with eating and drinking.  She reports burning discomfort as well and some nausea and burping.  She reports she started having diarrhea last few days as well as more nausea.  She reports the pain is severe now up to 9 out of 10 in severity.  She has had some chills and fatigue.  Reports no significant cough or congestion and reports the pain takes her breath away but denies shortness of breath.  Denies any trauma.  Denies any rashes due to shingles.  Reports no blood in her stools with the diarrhea.  On exam, lungs clear.  Chest nontender.  No murmur.  Abdomen is tender all over but worse in the right lower quadrant and right upper quadrant/epigastric area.  Bowel sounds were appreciated.  Flanks and back nontender.  No rash seen.  Dry mucous membranes.  Given patient's location discomfort, will get both CT and ultrasound to evaluate for possible appendicitis versus diverticulitis versus acute cholecystitis or other abnormality.  Will give her GI cocktail given her concern for possible ulcers and will get some pain and nausea medicine and fluids for her dry mucous membranes.  Will get a chest x-ray for the chest discomfort and get a troponin.  Will get EKG.  Anticipate reassessment after workup to determine disposition.      Workup returned showing evidence of likely colitis as the cause of symptoms.  Patient feeling better after medications.   Troponin negative.  We agreed to hold on doing a delta troponin at this time.  X-ray did not show pneumonia.  Patient feeling better.  Patient agrees with plan for antibiotics,  pain medicine and nausea medicine.  She will follow-up with PCP.  She will give prescription for pain medicine, nausea medicine, and antibiotics.  Patient able to drink and feel safe with discharge home.  Will discharge home for outpatient follow-up and good return precautions.        Final Clinical Impression(s) / ED Diagnoses Final diagnoses:  Colitis  Abdominal pain, unspecified abdominal location    Rx / DC Orders ED Discharge Orders          Ordered    amoxicillin -clavulanate (AUGMENTIN ) 875-125 MG tablet  2 times daily        10/23/23 2234    oxyCODONE -acetaminophen  (PERCOCET/ROXICET) 5-325 MG tablet  Every 4 hours PRN        10/23/23 2235    ondansetron  (ZOFRAN -ODT) 4 MG disintegrating tablet  Every 8 hours PRN        10/23/23 2235            Clinical Impression: 1. Colitis   2. Abdominal pain, unspecified abdominal location     Disposition: Discharge  Condition: Good  I have discussed the results, Dx and Tx plan with the pt(& family if present). He/she/they expressed understanding and agree(s) with the plan. Discharge instructions discussed at great length. Strict return precautions discussed and pt &/or family have verbalized understanding of the instructions. No further questions at time of discharge.    New Prescriptions   AMOXICILLIN -CLAVULANATE (AUGMENTIN ) 875-125 MG TABLET    Take 1 tablet by mouth 2 (two) times daily for 10 days.   ONDANSETRON  (ZOFRAN -ODT) 4 MG DISINTEGRATING TABLET    Take 1 tablet (4 mg total) by mouth every 8 (eight) hours as needed for nausea or vomiting.   OXYCODONE -ACETAMINOPHEN  (PERCOCET/ROXICET) 5-325 MG TABLET    Take 1 tablet by mouth every 4 (four) hours as needed for severe pain (pain score 7-10).    Follow Up: Tonita Fallow, MD [address] [place] [address] [phone]     [facility] Emergency Department at [facility] [address] [place]  [address] [phone]        Jerryl Holzhauer, Lonni PARAS, MD 10/23/23 [phone]

## 2023-10-23 NOTE — ED Notes (Signed)
 Urology at bedside.

## 2023-10-23 NOTE — ED Triage Notes (Signed)
 Pt BIBA from home. C/o gen abd pain, and chest pain for over a month, w/ diarrhea and nausea for 2x days.  Given 4 mg Zofran  IV by EMS

## 2023-10-23 NOTE — Discharge Instructions (Addendum)
 Your history, exam, workup today revealed colitis as the likely cause of your symptoms.  Please use the antibiotics, pain medicine, nausea medicine to help treat and manage her symptoms.  Please follow-up with your primary doctor and rest and stay hydrated.  If any symptoms change or worsen acutely, please return to the nearest emergency department.

## 2023-10-23 NOTE — ED Notes (Signed)
 [place] at bedside

## 2023-11-09 ENCOUNTER — Encounter: Payer: Self-pay | Admitting: Family Medicine

## 2023-11-09 ENCOUNTER — Ambulatory Visit: Admitting: Family Medicine

## 2023-11-09 VITALS — BP 100/60 | HR 78 | Temp 98.1°F | Ht 66.0 in | Wt 142.2 lb

## 2023-11-09 DIAGNOSIS — Z7689 Persons encountering health services in other specified circumstances: Secondary | ICD-10-CM | POA: Insufficient documentation

## 2023-11-09 DIAGNOSIS — K529 Noninfective gastroenteritis and colitis, unspecified: Secondary | ICD-10-CM | POA: Insufficient documentation

## 2023-11-09 DIAGNOSIS — Z2821 Immunization not carried out because of patient refusal: Secondary | ICD-10-CM | POA: Insufficient documentation

## 2023-11-09 DIAGNOSIS — R109 Unspecified abdominal pain: Secondary | ICD-10-CM

## 2023-11-09 MED ORDER — FAMOTIDINE 20 MG PO TABS
20.0000 mg | ORAL_TABLET | Freq: Two times a day (BID) | ORAL | 1 refills | Status: DC
Start: 2023-11-09 — End: 2023-12-01

## 2023-11-09 MED ORDER — HYDROCODONE-ACETAMINOPHEN 5-325 MG PO TABS: 1.0000 | ORAL_TABLET | Freq: Four times a day (QID) | ORAL | 0 refills | Status: AC | PRN

## 2023-11-09 MED ORDER — ONDANSETRON 4 MG PO TBDP: 4.0000 mg | ORAL_TABLET | Freq: Three times a day (TID) | ORAL | 0 refills | Status: DC | PRN

## 2023-11-09 MED ORDER — HYDROCODONE-ACETAMINOPHEN 5-325MG PREPACK (~~LOC~~: ORAL_TABLET | ORAL | 0 refills | Status: DC

## 2023-11-09 MED ORDER — PREDNISONE 10 MG (21) PO TBPK: ORAL_TABLET | ORAL | 0 refills | Status: DC

## 2023-11-09 NOTE — Progress Notes (Unsigned)
 I,Jameka J Llittleton, CMA,acting as a Neurosurgeon for [employer], NP.,have documented all relevant documentation on the behalf of Ellender Hose, NP,as directed by  Ellender Hose, NP while in the presence of Ellender Hose, NP.  Subjective:  Patient ID: Bonnie Hall , female    DOB: [birth year] , 63 y.o.   MRN: [id number]  Chief Complaint  Patient presents with   Establish Care   ER f/u    HPI  Patient is 63 year old female who presents today to establish primary care and for a hospital follow up. She went to the ER for severe stomach pain and chest pain on 10/23/2023, and after getting  CT abdomen/pelvis, she was diagnosed with colitis and got antibiotics for 14 days with Percocet tablets for pain for 10 days. Patient states that she still stomach pain and diarrhea. She is established with  Dr Vincente Poli, OB-GYN at [facility], [place]     Past Medical History:  Diagnosis Date   Allergy    Hay fever/exercise induced asthma   AMA (advanced maternal age) multigravida 35+    Anxiety    on meds   Arthritis    generalized   Asthma    Blood transfusion 1990's   Depression    bipolar=on meds   GERD (gastroesophageal reflux disease)    on meds   Hyperlipidemia    on meds   Iron deficiency anemia 1990's   negative work up as to etiology; required iron infusion, blood transfusion.  [facility] an hematologist  at [facility].    Left-sided Bell's palsy 02/03/2021   Migraine    Newborn product of in vitro fertilization (IVF) pregnancy    Pregnancy induced hypertension    on meds   Preterm labor    Sleep apnea    no CPAP   Thrombocytosis    Vaginal Pap smear, abnormal      Family History  Problem Relation Age of Onset   Alzheimer's disease Mother 84   Prostate cancer Father    Arrhythmia Father    Colon cancer Paternal Uncle 2   Colon cancer Paternal Uncle 63   Stroke Maternal Grandmother    Stroke Maternal Grandfather    Rheum arthritis Paternal Grandmother    Hearing loss  Daughter    Strabismus Daughter    Asperger's syndrome Daughter    ADD / ADHD Daughter    Intellectual disability Daughter    Polycystic ovary syndrome Daughter 72   Bipolar disorder Son    Colon polyps Neg Hx    Esophageal cancer Neg Hx    Stomach cancer Neg Hx    Rectal cancer Neg Hx      Current Outpatient Medications:    ALPRAZolam (XANAX) 1 MG tablet, Take 1 mg by mouth at bedtime as needed., Disp: , Rfl:    butalbital-acetaminophen-caffeine (FIORICET) 50-325-40 MG tablet, TAKE 1 TO 2 TABLETS EVERY 6 HOURS AS NEEDED ONSET OF HEADACHE. MAX 6 TABS PER EVENT. MAX 30 A MONTH, Disp: 30 tablet, Rfl: 0   clonazePAM (KLONOPIN) 1 MG tablet, Take by mouth., Disp: , Rfl:    erythromycin ophthalmic ointment, Apply to eye., Disp: , Rfl:    famotidine (PEPCID) 20 MG tablet, Take 1 tablet (20 mg total) by mouth 2 (two) times daily., Disp: 60 tablet, Rfl: 1   HYDROcodone-acetaminophen (VICODIN) 5-325 mg TABS tablet, Use as directed, Disp: 20 tablet, Rfl: 0   lamoTRIgine (LAMICTAL) 200 MG tablet, Take 200 mg by mouth at bedtime., Disp: ,  Rfl:    montelukast (SINGULAIR) 10 MG tablet, TAKE ONE TABLET BY MOUTH EVERY DAY AT BEDTIME, Disp: 90 tablet, Rfl: 3   pantoprazole (PROTONIX) 40 MG tablet, TAKE 1 TABLET BY MOUTH TWICE A DAY, Disp: 180 tablet, Rfl: 1   predniSONE (STERAPRED UNI-PAK 21 TAB) 10 MG (21) TBPK tablet, Use as directed, Disp: 21 tablet, Rfl: 0   SODIUM FLUORIDE 5000 SENSITIVE 1.1-5 % GEL, PLEASE SEE ATTACHED FOR DETAILED DIRECTIONS, Disp: , Rfl:    traZODone (DESYREL) 100 MG tablet, Take 1.5 tablets by mouth at bedtime., Disp: , Rfl:    ondansetron (ZOFRAN-ODT) 4 MG disintegrating tablet, Take 1 tablet (4 mg total) by mouth every 8 (eight) hours as needed for nausea or vomiting., Disp: 20 tablet, Rfl: 0  Current Facility-Administered Medications:    0.9 %  sodium chloride infusion, 500 mL, Intravenous, Once, Cunningham, Scott E, MD   ipratropium-albuterol (DUONEB) 0.5-2.5 (3) MG/3ML  nebulizer solution 3 mL, 3 mL, Nebulization, Once, Cranford, Tonya, NP   No Known Allergies   Review of Systems  Constitutional: Negative.   Eyes: Negative.   Gastrointestinal:  Positive for abdominal pain, diarrhea and nausea.  Musculoskeletal: Negative.   Skin: Negative.   Psychiatric/Behavioral: Negative.       Today's Vitals   11/09/23 0916  BP: 100/60  Pulse: 78  Temp: 98.1 F (36.7 C)  TempSrc: Oral  Weight: 142 lb 3.2 oz (64.5 kg)  Height: 5\' 6"  (1.676 m)  PainSc: 5   PainLoc: Abdomen   Body mass index is 22.95 kg/m.  Wt Readings from Last 3 Encounters:  11/09/23 142 lb 3.2 oz (64.5 kg)  10/23/23 139 lb (63 kg)  08/19/23 143 lb 3.2 oz (65 kg)    The 10-year ASCVD risk score (Arnett DK, et al., 2019) is: 2.1%   Values used to calculate the score:     Age: 25 years     Sex: Female     Is Non-Hispanic African American: No     Diabetic: No     Tobacco smoker: No     Systolic Blood Pressure: 100 mmHg     Is BP treated: No     HDL Cholesterol: 74 mg/dL     Total Cholesterol: 228 mg/dL  Objective:  Physical Exam Cardiovascular:     Rate and Rhythm: Normal rate and regular rhythm.  Pulmonary:     Effort: Pulmonary effort is normal.     Breath sounds: Normal breath sounds.  Abdominal:     Tenderness: There is abdominal tenderness.  Skin:    General: Skin is warm and dry.  Neurological:     Mental Status: She is alert and oriented to person, place, and time.  Psychiatric:        Mood and Affect: Mood normal.        Assessment And Plan:  Establishing care with new doctor, encounter for  Colitis -     Ambulatory referral to Gastroenterology  Abdominal pain, unspecified abdominal location  Herpes zoster vaccination declined  Other acute gastritis without hemorrhage  Other orders -     Famotidine; Take 1 tablet (20 mg total) by mouth 2 (two) times daily.  Dispense: 60 tablet; Refill: 1 -     predniSONE; Use as directed  Dispense: 21 tablet; Refill:  0 -     Ondansetron; Take 1 tablet (4 mg total) by mouth every 8 (eight) hours as needed for nausea or vomiting.  Dispense: 20 tablet; Refill: 0 -  HYDROcodone-acetaminophen; Use as directed  Dispense: 20 tablet; Refill: 0    Return in 2 months (on 01/07/2024), or if symptoms worsen or fail to improve, for physical.  Patient was given opportunity to ask questions. Patient verbalized understanding of the plan and was able to repeat key elements of the plan. All questions were answered to their satisfaction.    I, Ellender Hose, NP, have reviewed all documentation for this visit. The documentation on 11/09/23 for the exam, diagnosis, procedures, and orders are all accurate and complete.   IF YOU HAVE BEEN REFERRED TO A SPECIALIST, IT MAY TAKE 1-2 WEEKS TO SCHEDULE/PROCESS THE REFERRAL. IF YOU HAVE NOT HEARD FROM US/SPECIALIST IN TWO WEEKS, PLEASE GIVE [place] A CALL AT [phone].

## 2023-11-11 DIAGNOSIS — G510 Bell's palsy: Secondary | ICD-10-CM | POA: Diagnosis not present

## 2023-11-11 DIAGNOSIS — H04123 Dry eye syndrome of bilateral lacrimal glands: Secondary | ICD-10-CM | POA: Diagnosis not present

## 2023-11-12 DIAGNOSIS — Z419 Encounter for procedure for purposes other than remedying health state, unspecified: Secondary | ICD-10-CM | POA: Diagnosis not present

## 2023-11-15 DIAGNOSIS — F419 Anxiety disorder, unspecified: Secondary | ICD-10-CM | POA: Diagnosis not present

## 2023-11-15 DIAGNOSIS — H02401 Unspecified ptosis of right eyelid: Secondary | ICD-10-CM | POA: Diagnosis not present

## 2023-11-15 DIAGNOSIS — G244 Idiopathic orofacial dystonia: Secondary | ICD-10-CM | POA: Diagnosis not present

## 2023-11-15 DIAGNOSIS — G510 Bell's palsy: Secondary | ICD-10-CM | POA: Diagnosis not present

## 2023-11-16 ENCOUNTER — Ambulatory Visit: Payer: Self-pay | Admitting: Family Medicine

## 2023-11-16 ENCOUNTER — Ambulatory Visit: Payer: Self-pay | Admitting: Family

## 2023-11-16 NOTE — Telephone Encounter (Signed)
 Copied from CRM [id number]. Topic: Clinical - Red Word Triage >> Nov 16, 2023 12:00 PM Elle L wrote: Red Word that prompted transfer to Nurse Triage: The patient believes she has thrush as her tongue is white and it is going down her throat. She states that it is painful and burns. Reason for Disposition  [1] White patches that stick to tongue or inner cheek AND [2] can be wiped off  Answer Assessment - Initial Assessment Questions 1. SYMPTOM: "What's the main symptom you're concerned about?" (e.g., chapped lips, dry mouth, lump, sores)     I've had thrush going on.   I've had it before.    My tongue is white and I have a sore throat.   Sore throat since I went to hospital for colitis.   It's been hurting since Feb. 9, 2025   Getting worse and worse.   Now my tongue feels awful.   2. ONSET: "When did the  thrush  start?"     Since Feb. 9th 3. PAIN: "Is there any pain?" If Yes, ask: "How bad is it?" (Scale: 1-10; mild, moderate, severe)   - MILD (1-3):  doesn't interfere with eating or normal activities   - MODERATE (4-7): interferes with eating some solids and normal activities   - SEVERE (8-10):  excruciating pain, interferes with most normal activities   - SEVERE DYSPHAGIA: can't swallow liquids, drooling     moderate 4. CAUSE: "What do you think is causing the symptoms?"     I was in the hospital.  No antibiotics now bus was on antibiotics in the hospital    I was also on prednisone.  5. OTHER SYMPTOMS: "Do you have any other symptoms?" (e.g., fever, sore throat, toothache, swelling)     Sore throat 6. PREGNANCY: "Is there any chance you are pregnant?" "When was your last menstrual period?"     N/A  Protocols used: Mouth Symptoms-A-AH  Chief Complaint: Thrush on tongue and sore throat Symptoms:  above Frequency: Since Feb. 9th Pertinent Negatives: Patient denies It getting better Disposition: [] ED /[] Urgent Care (no appt availability in office) / [x] Appointment(In office/virtual)/ []   [facility]/ [] Home Care/ [] Refused Recommended Disposition /[] [facility]/ []  Follow-up with PCP Additional Notes: Appt made

## 2023-11-17 ENCOUNTER — Encounter: Payer: Self-pay | Admitting: Family Medicine

## 2023-11-17 ENCOUNTER — Ambulatory Visit (INDEPENDENT_AMBULATORY_CARE_PROVIDER_SITE_OTHER): Admitting: Family Medicine

## 2023-11-17 VITALS — BP 110/80 | HR 65 | Temp 98.2°F | Ht 66.0 in | Wt 142.0 lb

## 2023-11-17 DIAGNOSIS — R051 Acute cough: Secondary | ICD-10-CM

## 2023-11-17 DIAGNOSIS — B370 Candidal stomatitis: Secondary | ICD-10-CM

## 2023-11-17 DIAGNOSIS — R519 Headache, unspecified: Secondary | ICD-10-CM

## 2023-11-17 DIAGNOSIS — M542 Cervicalgia: Secondary | ICD-10-CM

## 2023-11-17 MED ORDER — BENZONATATE 100 MG PO CAPS: 100.0000 mg | ORAL_CAPSULE | Freq: Three times a day (TID) | ORAL | 0 refills | Status: DC | PRN

## 2023-11-17 MED ORDER — LIDOCAINE 5 % EX PTCH: 1.0000 | MEDICATED_PATCH | CUTANEOUS | 0 refills | Status: DC

## 2023-11-17 MED ORDER — NYSTATIN 100000 UNIT/ML MT SUSP: 5.0000 mL | Freq: Three times a day (TID) | OROMUCOSAL | 0 refills | Status: DC

## 2023-11-17 NOTE — Progress Notes (Addendum)
 I,Jameka J Llittleton, CMA,acting as a Neurosurgeon for [employer], NP.,have documented all relevant documentation on the behalf of Ellender Hose, NP,as directed by  Ellender Hose, NP while in the presence of Ellender Hose, NP.  Subjective:  Patient ID: Bonnie Hall , female    DOB: December 18, 1960 , 63 y.o.   MRN: [id number]  Chief Complaint  Patient presents with   Cough   Headache    HPI  Patient is a 63 year old female who presents today with complains of  thrush on her tongue which she states noticed a few days ago and also a sore in her gum that she rates 8/10 initially but later she said 3/10.  She also complains today of a dry cough,neck pain and a  dull headache. Patient states  that the headache has been ongoing since January. She states that her previous PCP had given her Fioricet to use as needed and she has also been using  Tylenol without any relief. Patient denies any trauma to the head or neck. Explained to patient that the neck pain might be the root cause of  the headache and offered to refer to orthopedic or Physical therapy  but she declined stating that she has being having headaches long before the neck pain  started some days ago.    Patient has no congestion,body aches or fever . She denies any nausea or vomiting at this time.     Past Medical History:  Diagnosis Date   Allergy    Hay fever/exercise induced asthma   AMA (advanced maternal age) multigravida 35+    Anxiety    on meds   Arthritis    generalized   Asthma    Blood transfusion 1990's   Depression    bipolar=on meds   GERD (gastroesophageal reflux disease)    on meds   Hyperlipidemia    on meds   Iron deficiency anemia 1990's   negative work up as to etiology; required iron infusion, blood transfusion.  Saw an hematologist  at [facility].    Left-sided Bell's palsy 02/03/2021   Migraine    Newborn product of in vitro fertilization (IVF) pregnancy    Pregnancy induced hypertension    on meds   Preterm labor     Sleep apnea    no CPAP   Thrombocytosis    Vaginal Pap smear, abnormal      Family History  Problem Relation Age of Onset   Alzheimer's disease Mother 57   Prostate cancer Father    Arrhythmia Father    Colon cancer Paternal Uncle 17   Colon cancer Paternal Uncle 24   Stroke Maternal Grandmother    Stroke Maternal Grandfather    Rheum arthritis Paternal Grandmother    Hearing loss Daughter    Strabismus Daughter    Asperger's syndrome Daughter    ADD / ADHD Daughter    Intellectual disability Daughter    Polycystic ovary syndrome Daughter 7   Bipolar disorder Son    Colon polyps Neg Hx    Esophageal cancer Neg Hx    Stomach cancer Neg Hx    Rectal cancer Neg Hx      Current Outpatient Medications:    ALPRAZolam (XANAX) 1 MG tablet, Take 1 mg by mouth at bedtime as needed., Disp: , Rfl:    benzonatate (TESSALON PERLES) 100 MG capsule, Take 1 capsule (100 mg total) by mouth 3 (three) times daily as needed., Disp: 30 capsule, Rfl: 0   erythromycin  ophthalmic ointment, Apply to eye., Disp: , Rfl:    famotidine (PEPCID) 20 MG tablet, Take 1 tablet (20 mg total) by mouth 2 (two) times daily., Disp: 60 tablet, Rfl: 1   lamoTRIgine (LAMICTAL) 200 MG tablet, Take 200 mg by mouth at bedtime., Disp: , Rfl:    lidocaine (LIDODERM) 5 %, Place 1 patch onto the skin daily. Remove & Discard patch within 12 hours or as directed by MD, Disp: 30 patch, Rfl: 0   magic mouthwash (nystatin, lidocaine, diphenhydrAMINE, alum & mag hydroxide) suspension, Swish and spit 5 mLs 3 (three) times daily., Disp: 180 mL, Rfl: 0   montelukast (SINGULAIR) 10 MG tablet, TAKE ONE TABLET BY MOUTH EVERY DAY AT BEDTIME, Disp: 90 tablet, Rfl: 3   ondansetron (ZOFRAN-ODT) 4 MG disintegrating tablet, Take 1 tablet (4 mg total) by mouth every 8 (eight) hours as needed for nausea or vomiting., Disp: 20 tablet, Rfl: 0   pantoprazole (PROTONIX) 40 MG tablet, TAKE 1 TABLET BY MOUTH TWICE A DAY, Disp: 180 tablet, Rfl:  1   predniSONE (STERAPRED UNI-PAK 21 TAB) 10 MG (21) TBPK tablet, Use as directed, Disp: 21 tablet, Rfl: 0   SODIUM FLUORIDE 5000 SENSITIVE 1.1-5 % GEL, PLEASE SEE ATTACHED FOR DETAILED DIRECTIONS, Disp: , Rfl:    clonazePAM (KLONOPIN) 1 MG tablet, Take by mouth., Disp: , Rfl:    traZODone (DESYREL) 100 MG tablet, Take 1.5 tablets by mouth at bedtime., Disp: , Rfl:   Current Facility-Administered Medications:    0.9 %  sodium chloride infusion, 500 mL, Intravenous, Once, Cunningham, Scott E, MD   ipratropium-albuterol (DUONEB) 0.5-2.5 (3) MG/3ML nebulizer solution 3 mL, 3 mL, Nebulization, Once, Cranford, Tonya, NP   No Known Allergies   Review of Systems  Constitutional: Negative.   HENT:  Positive for mouth sores.   Respiratory:  Positive for cough. Negative for shortness of breath.   Cardiovascular: Negative.   Genitourinary: Negative.   Musculoskeletal:  Positive for neck pain. Negative for neck stiffness.  Neurological:  Positive for headaches. Negative for dizziness.  Psychiatric/Behavioral:  The patient is hyperactive.      Today's Vitals   11/17/23 0908  BP: 110/80  Pulse: 65  Temp: 98.2 F (36.8 C)  TempSrc: Oral  Weight: 142 lb (64.4 kg)  Height: 5\' 6"  (1.676 m)  PainSc: 2   PainLoc: Neck   Body mass index is 22.92 kg/m.  Wt Readings from Last 3 Encounters:  11/17/23 142 lb (64.4 kg)  11/09/23 142 lb 3.2 oz (64.5 kg)  10/23/23 139 lb (63 kg)    The 10-year ASCVD risk score (Arnett DK, et al., 2019) is: 2.5%   Values used to calculate the score:     Age: 46 years     Sex: Female     Is Non-Hispanic African American: No     Diabetic: No     Tobacco smoker: No     Systolic Blood Pressure: 110 mmHg     Is BP treated: No     HDL Cholesterol: 74 mg/dL     Total Cholesterol: 228 mg/dL  Objective:  Physical Exam HENT:     Head: Normocephalic.     Mouth/Throat:     Comments: Thrush on tongue Cardiovascular:     Rate and Rhythm: Normal rate.  Pulmonary:      Effort: Pulmonary effort is normal.     Breath sounds: Normal breath sounds.  Abdominal:     General: Bowel sounds are normal.  Musculoskeletal:  Cervical back: No edema, erythema or signs of trauma. Normal range of motion.  Neurological:     Mental Status: She is alert.         Assessment And Plan:  Thrush, oral Assessment & Plan: Use Mouth wash as needed  Orders: -     magic mouthwash (nystatin, lidocaine, diphenhydrAMINE, alum & mag hydroxide) suspension; Swish and spit 5 mLs 3 (three) times daily.  Dispense: 180 mL; Refill: 0  Acute cough -     Benzonatate; Take 1 capsule (100 mg total) by mouth 3 (three) times daily as needed.  Dispense: 30 capsule; Refill: 0  Nonintractable headache, unspecified chronicity pattern, unspecified headache type -     Ambulatory referral to Neurology  Neck pain Assessment & Plan: Use Lidoderm patches as needed  Orders: -     Lidocaine; Place 1 patch onto the skin daily. Remove & Discard patch within 12 hours or as directed by MD  Dispense: 30 patch; Refill: 0    Return if symptoms worsen or fail to improve, for follw up.  Patient was given opportunity to ask questions. Patient verbalized understanding of the plan and was able to repeat key elements of the plan. All questions were answered to their satisfaction.   I, Ellender Hose, NP, have reviewed all documentation for this visit. The documentation on 11/25/2023 for the exam, diagnosis, procedures, and orders are all accurate and complete.     IF YOU HAVE BEEN REFERRED TO A SPECIALIST, IT MAY TAKE 1-2 WEEKS TO SCHEDULE/PROCESS THE REFERRAL. IF YOU HAVE NOT HEARD FROM US/SPECIALIST IN TWO WEEKS, PLEASE GIVE [place] A CALL AT [phone].

## 2023-11-18 ENCOUNTER — Ambulatory Visit: Payer: Self-pay | Admitting: Family Medicine

## 2023-11-18 NOTE — Telephone Encounter (Signed)
 Copied from CRM [id number]. Topic: Clinical - Red Word Triage >> Nov 18, 2023  2:37 PM Hamdi H wrote: Red Word that prompted transfer to Nurse Triage: Pain in stomach after eating and diarrhea, also has thrush.    Chief Complaint: Management for Symptoms Symptoms: Colitis, thrush Frequency: x 2 months Pertinent Negatives: Patient denies back pain, fever, urination pain, vomiting, blood in stool Disposition: [] ED /[] Urgent Care (no appt availability in office) / [] Appointment(In office/virtual)/ []  [facility]/ [] Home Care/ [] Refused Recommended Disposition /[] [facility]/ []  Follow-up with PCP Additional Notes: Patient called and advised that she wants to established---Her PCP had passed away. Patient went to the ER 10/23/2023 and was diagnosed with Colitis. Patient also states that her neck hurts Patient states that she has had a dull headache every day all day since she was in the hospital. Patient was referred to neurology. Patient also is referred to a GI doctor in April. Patient states that if she eats anything spicy her GI system gets aggravated.  Patient has been prescribed nausea medicine and pain medicine for her abdominal issues. Patient states she is still having pain Patient also states that she is going to need a physical soon too. Patient is not looking to be triaged today. She is trying to follow up and get a new doctor to have all her medical concerns in one location and not be referred out. Patient states that she may need a refill on medications that are helping her colitis. Patient states she is simply trying to find a new doctor who will be proactive in her care and there is nothing new or different with her symptoms today.  Patient given Care Advice and is also advised if things worsen to go to the emergency room.  Patient verbalized understanding.  This RN called the Clinical Access Line for [facility] due to patient wanting to get  established there as a patient.  This RN spoke with staff at [facility] about patient's concerns and wanting to get a new provider at their location.  They agreed to speak with her and schedule her appropriately if she decides to be established there. Patient is connected with them at this time.    Reason for Disposition  Abdominal pain is a chronic symptom (recurrent or ongoing AND present > 4 weeks)  Answer Assessment - Initial Assessment Questions 1. LOCATION: "Where does it hurt?"      Lower and Middle 2. RADIATION: "Does the pain shoot anywhere else?" (e.g., chest, back)     Sometimes up to ribcage 3. ONSET: "When did the pain begin?" (e.g., minutes, hours or days ago)      Back in beginning of January 4. SUDDEN: "Gradual or sudden onset?"     Just started 5. PATTERN "Does the pain come and go, or is it constant?"    - If it comes and goes: "How long does it last?" "Do you have pain now?"     (Note: Comes and goes means the pain is intermittent. It goes away completely between bouts.)    - If constant: "Is it getting better, staying the same, or getting worse?"      (Note: Constant means the pain never goes away completely; most serious pain is constant and gets worse.)      "Mild but it acts up every so often but not like it did when I went to the hospital" 6. SEVERITY: "How bad is the pain?"  (e.g., Scale 1-10;  mild, moderate, or severe)    - MILD (1-3): Doesn't interfere with normal activities, abdomen soft and not tender to touch.     - MODERATE (4-7): Interferes with normal activities or awakens from sleep, abdomen tender to touch.     - SEVERE (8-10): Excruciating pain, doubled over, unable to do any normal activities.       2 7. RECURRENT SYMPTOM: "Have you ever had this type of stomach pain before?" If Yes, ask: "When was the last time?" and "What happened that time?"      Went to ER beginning of Feb 8. CAUSE: "What do you think is causing the stomach pain?"      Unsure 9. RELIEVING/AGGRAVATING FACTORS: "What makes it better or worse?" (e.g., antacids, bending or twisting motion, bowel movement)     Tried different medications  10. OTHER SYMPTOMS: "Do you have any other symptoms?" (e.g., back pain, diarrhea, fever, urination pain, vomiting)       Diarrhea 11. PREGNANCY: "Is there any chance you are pregnant?" "When was your last menstrual period?"       No  Protocols used: Abdominal Pain - [facility]

## 2023-11-22 ENCOUNTER — Other Ambulatory Visit: Payer: Self-pay | Admitting: Family Medicine

## 2023-11-22 DIAGNOSIS — R109 Unspecified abdominal pain: Secondary | ICD-10-CM

## 2023-11-23 ENCOUNTER — Encounter: Payer: Self-pay | Admitting: Family Medicine

## 2023-11-24 ENCOUNTER — Ambulatory Visit: Payer: Self-pay | Admitting: Family Medicine

## 2023-11-24 ENCOUNTER — Other Ambulatory Visit: Payer: Self-pay | Admitting: Family Medicine

## 2023-11-24 NOTE — Telephone Encounter (Signed)
 Patient attempting call after calling in yesterday to check on status of refill requests for Hydrocodone, Zofran, and another medication for a cough due to the benzonatate not working effectively. Patient states she spoke with Marquette Old in the office yesterday to inform her of these medication requests but understands that the office is busy and hasn't heard back, so she wanted to follow up about these concerns. Patient reiterates she is unable to get into GI for another month and needs these medications to manage her symptoms.   Copied from CRM [id number]. Topic: Clinical - Red Word Triage >> Nov 24, 2023 12:54 PM Higinio Roger wrote: Red Word that prompted transfer to Nurse Triage: Patient states she will not be going to the emergency room or urgent care but she has severe pain from time to time and needs her pain medication (Hydrocodone) until she sees the GI doctor. Patient has Colitis Reason for Disposition  Caller requesting an appointment, triage offered and declined  Answer Assessment - Initial Assessment Questions 1. REASON FOR CALL or QUESTION: "What is your reason for calling today?" or "How can I best help you?" or "What question do you have that I can help answer?"     Medication concerns - see notes 2. CALLER: Document the source of call. (e.g., laboratory, patient).     Patient  Protocols used: PCP Call - No Triage-A-AH

## 2023-11-25 ENCOUNTER — Telehealth: Payer: Self-pay

## 2023-11-25 ENCOUNTER — Other Ambulatory Visit: Payer: Self-pay | Admitting: Family Medicine

## 2023-11-25 ENCOUNTER — Telehealth: Payer: Self-pay | Admitting: Family Medicine

## 2023-11-25 DIAGNOSIS — B370 Candidal stomatitis: Secondary | ICD-10-CM | POA: Insufficient documentation

## 2023-11-25 DIAGNOSIS — R519 Headache, unspecified: Secondary | ICD-10-CM | POA: Insufficient documentation

## 2023-11-25 DIAGNOSIS — M542 Cervicalgia: Secondary | ICD-10-CM | POA: Insufficient documentation

## 2023-11-25 DIAGNOSIS — R109 Unspecified abdominal pain: Secondary | ICD-10-CM

## 2023-11-25 DIAGNOSIS — R051 Acute cough: Secondary | ICD-10-CM | POA: Insufficient documentation

## 2023-11-25 NOTE — Telephone Encounter (Signed)
 Patient called regarding her medication refill on Zofran states she was told that a prescription will be sent to the pharmacy by pat and then again by nurse at our office directly. After reading notes and phone message patient did speak with pat but pat informed patient she will not be sending any Rx and the Rx she did offer to send patient refused. She did call the office on 3/14 but she spoke with a Triage nurse with [facility] NOT a nurse within the office. According to the [facility] rep patient was yelling and rude regarding the medication refill.

## 2023-11-25 NOTE — Telephone Encounter (Signed)
 Patient called on 11/24/2023 stating that Jerilynn Som were not working for her cough,, she needed a refill on zofran and also that she wanted a refill on pain medication until she can see the G.I doctor in April.  I told her that I couldn't give her anymore opioids because she was on Clonazepam already she got upset then she calm down. When she asked what cough medication I was prescribing for her, I said promethazine-dex., she said "forget about it,I already have that in my closet" and I said but it is not in your record, she it is in my closet. I have not been using it so I did not tell you guys.

## 2023-11-25 NOTE — Telephone Encounter (Signed)
 Copied from CRM [id number]. Topic: Clinical - Prescription Issue >> Nov 25, 2023 11:31 AM Clayton Bibles wrote: Reason for CRM: Dyneisha needs her ondansetron (ZOFRAN-ODT) 4 MG disintegrating tablet refilled. I show in chart that the refill was requested on 11/22/23 but has not been sent to her [facility]. Please send refill in. Thanks Ellee will be out of medication tomorrow (11/26/23)

## 2023-11-25 NOTE — Assessment & Plan Note (Signed)
 Use Mouth wash as needed

## 2023-11-25 NOTE — Assessment & Plan Note (Signed)
 Use Lidoderm patches as needed

## 2023-11-28 ENCOUNTER — Encounter: Payer: Self-pay | Admitting: Family

## 2023-11-28 ENCOUNTER — Ambulatory Visit: Admitting: Family

## 2023-11-28 VITALS — BP 124/80 | HR 79 | Temp 97.8°F | Resp 19 | Ht 66.0 in | Wt 139.6 lb

## 2023-11-28 DIAGNOSIS — I700 Atherosclerosis of aorta: Secondary | ICD-10-CM

## 2023-11-28 DIAGNOSIS — R42 Dizziness and giddiness: Secondary | ICD-10-CM

## 2023-11-28 DIAGNOSIS — J3089 Other allergic rhinitis: Secondary | ICD-10-CM | POA: Diagnosis not present

## 2023-11-28 DIAGNOSIS — K219 Gastro-esophageal reflux disease without esophagitis: Secondary | ICD-10-CM | POA: Diagnosis not present

## 2023-11-28 DIAGNOSIS — E782 Mixed hyperlipidemia: Secondary | ICD-10-CM | POA: Diagnosis not present

## 2023-11-28 DIAGNOSIS — Z7689 Persons encountering health services in other specified circumstances: Secondary | ICD-10-CM

## 2023-11-28 DIAGNOSIS — R519 Headache, unspecified: Secondary | ICD-10-CM

## 2023-11-28 DIAGNOSIS — K529 Noninfective gastroenteritis and colitis, unspecified: Secondary | ICD-10-CM

## 2023-11-28 DIAGNOSIS — F3175 Bipolar disorder, in partial remission, most recent episode depressed: Secondary | ICD-10-CM

## 2023-11-28 MED ORDER — ONDANSETRON 4 MG PO TBDP: 4.0000 mg | ORAL_TABLET | Freq: Three times a day (TID) | ORAL | 0 refills | Status: DC | PRN

## 2023-11-28 NOTE — Progress Notes (Signed)
 Provider: Richarda Blade FNP-C   Matison Nuccio, Donalee Citrin, NP  Patient Care Team: Tya Haughey, Donalee Citrin, NP as PCP - General (Family Medicine) Marcelle Overlie, MD as Attending Physician (Obstetrics and Gynecology) Kevan Rosebush, NP as Nurse Practitioner (Psychiatry)  Extended Emergency Contact Information Primary Emergency Contact: Uddin,Richard Address: [address]          [place], [place] [address] Darden [place] of [place] Work Phone: [phone] Mobile Phone: [phone] Relation: Spouse  Code Status:  Full Code  Goals of care: Advanced Directive information    11/28/2023   10:00 AM  Advanced Directives  Does Patient Have a Medical Advance Directive? No  Would patient like information on creating a medical advance directive? No - Patient declined     Chief Complaint  Patient presents with   Establish Care    New patient appointment.     Discussed the use of AI scribe software for clinical note transcription with the patient, who gave verbal consent to proceed.  History of Present Illness   Bonnie Hall is a 63 year old female with colitis ,Bipolar, Generalized GERD, Bell's palsy, who presents for establishing care and management of her symptoms.  She was recently diagnosed with colitis following hospitalization on February 9th. Initially suspecting an ulcer, imaging studies including MRIs and CT scans confirmed colitis. She experiences daily abdominal pain, which is less severe than in February, accompanied by nausea and diarrhea after eating. She takes Zofran 4 mg every eight hours as needed for nausea and Lomotil for diarrhea. An appointment with a gastroenterologist is scheduled for April 11th.  She reports a persistent dull headache since the onset of her gastrointestinal symptoms, unresponsive to extra strength Tylenol. She avoids migraine medications due to concerns about exacerbating her gastrointestinal issues. The headache is described as a 'dull  headache that will not go away' and occurs daily.  She experiences lightheadedness and dizziness throughout the day, feeling as if she might pass out, especially when walking or standing. She notes that her focus is off and she feels lightheaded even when driving. She has not taken Xanax recently, which she uses as needed for anxiety.  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 She has completed a prednisone pack and uses Tessalon for cough occasionally. She also uses Magic Mouthwash for oral thrush.  Her social history includes no smoking or alcohol use. She is a mother of three children, with the youngest being 63 years old. She has had a tonsillectomy in 1966 and C-sections in 2009 and 2018. She is up to date with her Pap smear, mammogram, flu, and COVID vaccinations.    Past Medical History:  Diagnosis Date   Allergy    Hay fever/exercise induced asthma   AMA (advanced maternal age) multigravida 35+    Anxiety    on meds   Arthritis    generalized   Asthma    Blood transfusion 1990's   Colitis 10/23/2023   Depression    bipolar=on meds   GERD (gastroesophageal reflux disease)    on meds   Hyperlipidemia    on meds   Iron deficiency anemia 1990's   negative work up as to etiology; required iron infusion, blood transfusion.  Saw an hematologist  at [facility].    Left-sided Bell's palsy 02/03/2021  Migraine    Newborn product of in vitro fertilization (IVF) pregnancy    Pregnancy induced hypertension    on meds   Preterm labor    Sleep apnea    no CPAP   Thrombocytosis    Vaginal Pap smear, abnormal    Past Surgical History:  Procedure Laterality Date   CESAREAN SECTION  2009   CESAREAN SECTION N/A 03/09/2017   Procedure: REPEAT CESAREAN SECTION;   Surgeon: Marcelle Overlie, MD;  Location: [facility] BIRTHING SU[facility];  Service: Obstetrics;  Laterality: N/A;   COLONOSCOPY  06/2012   Dr.Kaplan-MAC-movi(exc)-normal-10 yr recall   GYNECOLOGIC CRYOSURGERY     NASAL SEPTUM SURGERY  1983   TONSILLECTOMY  1966    No Known Allergies  Allergies as of 11/28/2023   No Known Allergies      Medication List        Accurate as of November 28, 2023 12:23 PM. If you have any questions, ask your nurse or doctor.          STOP taking these medications    predniSONE 10 MG (21) Tbpk tablet Commonly known as: STERAPRED UNI-PAK 21 TAB Stopped by: Netanel Yannuzzi C Shiasia Porro   Sodium Fluoride 5000 Sensitive 1.1-5 % Gel Generic drug: Sod Fluoride-Potassium Nitrate Stopped by: Donalee Citrin Tamiya Colello       TAKE these medications    ALPRAZolam 1 MG tablet Commonly known as: XANAX Take 1 mg by mouth at bedtime as needed.   benzonatate 100 MG capsule Commonly known as: Tessalon Perles Take 1 capsule (100 mg total) by mouth 3 (three) times daily as needed.   clonazePAM 1 MG tablet Commonly known as: KLONOPIN Take 0.75 mg by mouth daily.   erythromycin ophthalmic ointment Apply to eye.   famotidine 20 MG tablet Commonly known as: PEPCID Take 1 tablet (20 mg total) by mouth 2 (two) times daily.   lamoTRIgine 200 MG tablet Commonly known as: LAMICTAL Take 300 mg by mouth daily.   lidocaine 5 % Commonly known as: Lidoderm Place 1 patch onto the skin daily. Remove & Discard patch within 12 hours or as directed by MD   magic mouthwash (nystatin, lidocaine, diphenhydrAMINE, alum & mag hydroxide) suspension Swish and spit 5 mLs 3 (three) times daily.   montelukast 10 MG tablet Commonly known as: SINGULAIR TAKE ONE TABLET BY MOUTH EVERY DAY AT BEDTIME   ondansetron 4 MG disintegrating tablet Commonly known as: ZOFRAN-ODT Take 1 tablet (4 mg total) by mouth every 8 (eight) hours as needed for nausea or vomiting.   pantoprazole 40 MG tablet Commonly known  as: PROTONIX TAKE 1 TABLET BY MOUTH TWICE A DAY   promethazine-phenylephrine 6.25-5 MG/5ML Syrp Commonly known as: PROMETHAZINE VC Take 5 mLs by mouth every 4 (four) hours as needed for congestion.   traZODone 100 MG tablet Commonly known as: DESYREL Take 200 mg by mouth at bedtime.   vortioxetine HBr 20 MG Tabs tablet Commonly known as: TRINTELLIX Take 20 mg by mouth daily.        Review of Systems  Constitutional:  Negative for appetite change, chills, fatigue, fever and unexpected weight change.  HENT:  Negative for congestion, dental problem, ear discharge, ear pain, facial swelling, hearing loss, nosebleeds, postnasal drip, rhinorrhea, sinus pressure, sinus pain, sneezing, sore throat, tinnitus and trouble swallowing.   Eyes:  Negative for pain, discharge, redness, itching and visual disturbance.  Respiratory:  Negative for cough, chest tightness, shortness of breath and wheezing.   Cardiovascular:  Negative for chest  pain, palpitations and leg swelling.  Gastrointestinal:  Negative for abdominal distention, abdominal pain, blood in stool, constipation, diarrhea, nausea and vomiting.  Endocrine: Negative for cold intolerance, heat intolerance, polydipsia, polyphagia and polyuria.  Genitourinary:  Negative for difficulty urinating, dysuria, flank pain, frequency and urgency.  Musculoskeletal:  Negative for arthralgias, back pain, gait problem, joint swelling, myalgias, neck pain and neck stiffness.  Skin:  Negative for color change, pallor, rash and wound.  Neurological:  Positive for light-headedness. Negative for dizziness, syncope, speech difficulty, weakness, numbness and headaches.       Dull headache   Hematological:  Does not bruise/bleed easily.  Psychiatric/Behavioral:  Negative for agitation, behavioral problems, confusion, hallucinations, self-injury, sleep disturbance and suicidal ideas. The patient is nervous/anxious.        Follows up with psychiatrist for anxiety  and bipolar     Immunization History  Administered Date(s) Administered   Influenza Inj Mdck Quad With Preservative 07/13/2018   Influenza Split 07/10/2013, 07/04/2014, 06/17/2015   Influenza,inj,Quad PF,6+ Mos 09/28/2022   Influenza-Unspecified 08/01/2021   PFIZER(Purple Top)SARS-COV-2 Vaccination 12/07/2019, 12/22/2019, 03/30/2021, 11/20/2023   PPD Test 01/14/2014, 01/16/2015   Pneumococcal Polysaccharide-23 12/31/2011   Tdap 12/28/2010, 09/13/2016   Pertinent  Health Maintenance Due  Topic Date Due   INFLUENZA VACCINE  04/14/2023   MAMMOGRAM  10/15/2024   DEXA SCAN  11/18/2027   Colonoscopy  09/08/2029      04/23/2020   12:21 AM 01/28/2021    9:24 AM 04/14/2021    9:47 AM 11/09/2023    9:20 AM 11/28/2023    9:57 AM  Fall Risk  Falls in the past year?   0 0 0  Was there an injury with Fall?   0 0 0  Fall Risk Category Calculator   0 0 0  Fall Risk Category (Retired)   Low    (RETIRED) Patient Fall Risk Level Low fall risk Low fall risk Low fall risk    Patient at Risk for Falls Due to    No Fall Risks No Fall Risks  Fall risk Follow up    Falls evaluation completed Falls evaluation completed   Functional Status Survey:    Vitals:   11/28/23 1123 11/28/23 1126 11/28/23 1128 11/28/23 1131  BP: 120/74 120/68 128/78 124/80  Pulse: 71 68 77 79  Resp:      Temp:      SpO2: 96% 97% 92% 96%  Weight:      Height:       Body mass index is 22.53 kg/m. Physical Exam  VITALS: BP- 140/ GENERAL: Alert, cooperative, well developed, no acute distress. HEENT: Normocephalic, normal oropharynx, moist mucous membranes, eardrums normal, nose normal, throat normal, no nasal congestion, no sinus tenderness. CHEST: Clear to auscultation bilaterally, no wheezes, rhonchi, or crackles. CARDIOVASCULAR: Normal heart rate and rhythm, S1 and S2 normal without murmurs. ABDOMEN: Soft, non-tender, non-distended, without organomegaly, normal bowel sounds, hyperactive bowel sounds, no abdominal  tenderness. EXTREMITIES: No cyanosis or edema. NEUROLOGICAL: Cranial nerves grossly intact, moves all extremities without gross motor or sensory deficit. PSYCHIATRY/BEHAVIORAL: Mood stable    Labs reviewed: Recent Labs    03/29/23 1211 08/05/23 1126 10/23/23 1715  NA 139 139 131*  K 4.3 4.9 3.5  CL 105 102 97*  CO2 28 28 25   GLUCOSE 101* 95 108*  BUN 22 18 19   CREATININE 0.80 0.75 0.89  CALCIUM 9.5 9.7 9.1   Recent Labs    03/29/23 1211 08/05/23 1126 10/23/23 1715  AST 10  11 14*  ALT 8 10 13   ALKPHOS  --   --  46  BILITOT 0.3 0.3 0.7  PROT 6.8 7.0 7.0  ALBUMIN  --   --  4.0   Recent Labs    03/29/23 1211 08/05/23 1126 10/23/23 1715  WBC 5.4 6.7 17.7*  NEUTROABS 3,613 4,891  --   HGB 12.6 13.1 12.5  HCT 38.1 39.7 39.2  MCV 95.3 95.0 97.3  PLT 413* 464* 338   Lab Results  Component Value Date   TSH 0.91 11/10/2022   Lab Results  Component Value Date   HGBA1C 5.3 03/29/2023   Lab Results  Component Value Date   CHOL 228 (H) 06/29/2023   HDL 74 06/29/2023   LDLCALC 137 (H) 06/29/2023   TRIG 76 06/29/2023   CHOLHDL 3.1 06/29/2023    Significant Diagnostic Results in last 30 days:  No results found.  Assessment/Plan Colitis Colitis diagnosed in February with symptoms of daily abdominal pain, diarrhea, nausea, and burping. Pain is less severe than in February. Nausea managed with Zofran. Scheduled gastroenterology consultation on April 11th. Advised to avoid NSAIDs. - Continue Zofran 4 mg every 8 hours as needed for nausea - Follow up with gastroenterologist on April 11th  Dizziness and Lightheadedness New onset dizziness and lightheadedness, exacerbated by walking and standing. No recent Xanax use. Differential includes medication side effects or anemia. Plan to check orthostatic blood pressures and rule out anemia. - Check orthostatic blood pressures - Order CBC to rule out anemia or infection then consider CT of head or refer to  Neurology.  Chronic Headache Persistent dull headache since colitis onset, unresponsive to extra strength Tylenol. Differential includes tension headache or secondary to colitis. Consider neurologist referral if symptoms persist. - Consider referral to neurologist if symptoms persist  Aortic Atherosclerosis Mild aortic atherosclerosis on CT scan. Discussed dietary modifications to manage cholesterol and reduce plaque buildup. Advised against NSAIDs due to colitis. Recommended avoiding high-fat foods and increasing lean meats, plant-based proteins, and vegetables. - Implement dietary changes to reduce saturated fats and increase intake of lean meats, plant-based proteins, and vegetables - Recheck cholesterol levels after dietary changes - Consider starting cholesterol medication if levels remain high  Osteoarthritis Osteoarthritis affecting hands, feet, and legs.  General Health Maintenance Up to date with Pap smear, mammogram, flu shot, and COVID-19 vaccine. Considering pneumonia vaccine, hesitant about shingles vaccine due to husband's adverse reaction. Discussed benefits of pneumonia vaccine. - Administer pneumonia vaccine  Follow-up Plan for follow-up visits and tests to monitor health conditions. Importance of fasting lab work to assess cholesterol, thyroid, kidney, liver, and electrolytes discussed. - Schedule fasting lab work to check cholesterol, thyroid, kidney, liver, and electrolytes - Return for orthostatic blood pressure check and blood work - Follow up in 3 months   Family/ staff Communication: Reviewed plan of care with patient verbalized understanding   Labs/tests ordered:  - CBC with Differential/Platelet - CMP with eGFR([facility]) - TSH - Lipid panel   Next Appointment : Return in about 6 months (around 05/30/2024) for medical mangement of chronic issues.Fasting labs soon.Marland Kitchen   Spent 45 minutes of Face to face and non-face to face with patient  >50% time spent  counseling; reviewing medical record; tests; labs; documentation and developing future plan of care.   Caesar Bookman, NP

## 2023-11-29 ENCOUNTER — Other Ambulatory Visit

## 2023-11-29 DIAGNOSIS — E782 Mixed hyperlipidemia: Secondary | ICD-10-CM

## 2023-11-29 DIAGNOSIS — F3175 Bipolar disorder, in partial remission, most recent episode depressed: Secondary | ICD-10-CM

## 2023-11-29 DIAGNOSIS — K219 Gastro-esophageal reflux disease without esophagitis: Secondary | ICD-10-CM

## 2023-11-30 ENCOUNTER — Telehealth: Payer: Self-pay | Admitting: *Deleted

## 2023-11-30 DIAGNOSIS — E785 Hyperlipidemia, unspecified: Secondary | ICD-10-CM

## 2023-11-30 DIAGNOSIS — T50905A Adverse effect of unspecified drugs, medicaments and biological substances, initial encounter: Secondary | ICD-10-CM

## 2023-11-30 DIAGNOSIS — R42 Dizziness and giddiness: Secondary | ICD-10-CM

## 2023-11-30 DIAGNOSIS — Z7409 Other reduced mobility: Secondary | ICD-10-CM

## 2023-11-30 LAB — COMPREHENSIVE METABOLIC PANEL
AG Ratio: 1.8 (calc) (ref 1.0–2.5)
ALT: 8 U/L (ref 6–29)
AST: 11 U/L (ref 10–35)
Albumin: 4.3 g/dL (ref 3.6–5.1)
Alkaline phosphatase (APISO): 59 U/L (ref 37–153)
BUN: 16 mg/dL (ref 7–25)
CO2: 30 mmol/L (ref 20–32)
Calcium: 9.4 mg/dL (ref 8.6–10.4)
Chloride: 101 mmol/L (ref 98–110)
Creat: 0.77 mg/dL (ref 0.50–1.05)
Globulin: 2.4 g/dL (ref 1.9–3.7)
Glucose, Bld: 94 mg/dL (ref 65–99)
Potassium: 4.8 mmol/L (ref 3.5–5.3)
Sodium: 138 mmol/L (ref 135–146)
Total Bilirubin: 0.6 mg/dL (ref 0.2–1.2)
Total Protein: 6.7 g/dL (ref 6.1–8.1)
eGFR: 88 mL/min/{1.73_m2} (ref >=60)

## 2023-11-30 LAB — CBC WITH DIFFERENTIAL/PLATELET
Absolute Lymphocytes: 1162 {cells}/uL (ref 850–3900)
Absolute Monocytes: 530 {cells}/uL (ref 200–950)
Basophils Absolute: 47 {cells}/uL (ref 0–200)
Basophils Relative: 0.6 %
Eosinophils Absolute: 281 {cells}/uL (ref 15–500)
Eosinophils Relative: 3.6 %
HCT: 40 % (ref 35.0–45.0)
Hemoglobin: 13.2 g/dL (ref 11.7–15.5)
MCH: 31.6 pg (ref 27.0–33.0)
MCHC: 33 g/dL (ref 32.0–36.0)
MCV: 95.7 fL (ref 80.0–100.0)
MPV: 10.2 fL (ref 7.5–12.5)
Monocytes Relative: 6.8 %
Neutro Abs: 5780 {cells}/uL (ref 1500–7800)
Neutrophils Relative %: 74.1 %
Platelets: 431 10*3/uL — ABNORMAL HIGH (ref 140–400)
RBC: 4.18 10*6/uL (ref 3.80–5.10)
RDW: 12.9 % (ref 11.0–15.0)
Total Lymphocyte: 14.9 %
WBC: 7.8 10*3/uL (ref 3.8–10.8)

## 2023-11-30 LAB — LIPID PANEL
Cholesterol: 202 mg/dL — ABNORMAL HIGH (ref <200)
HDL: 60 mg/dL (ref >=50)
LDL Cholesterol (Calc): 120 mg/dL — ABNORMAL HIGH
Non-HDL Cholesterol (Calc): 142 mg/dL — ABNORMAL HIGH (ref <130)
Total CHOL/HDL Ratio: 3.4 (calc) (ref <5.0)
Triglycerides: 116 mg/dL (ref <150)

## 2023-11-30 LAB — TSH: TSH: 0.88 m[IU]/L (ref 0.40–4.50)

## 2023-11-30 MED ORDER — ATORVASTATIN CALCIUM 10 MG PO TABS: 10.0000 mg | ORAL_TABLET | Freq: Every day | ORAL | 3 refills | Status: DC

## 2023-11-30 NOTE — Telephone Encounter (Signed)
 Copied from CRM [id number]. Topic: Clinical - Lab/Test Results >> Nov 30, 2023 11:26 AM Prudencio Pair wrote: Reason for CRM: Patient called to check if lab results has came back. Advised pt that the provider has not reviewed them as of yet but once she does, the nurse will give her a call back. Patient will be awaiting a call back.   Awaiting Labs.

## 2023-11-30 NOTE — Telephone Encounter (Signed)
 Copied from CRM [id number]. Topic: Referral - Request for Referral >> Nov 30, 2023 11:27 AM Prudencio Pair wrote: Did the patient discuss referral with their provider in the last year? Yes (If No - schedule appointment) (If Yes - send message)  Appointment offered? No  Type of order/referral and detailed reason for visit: Patient requesting referral to neurologist about headaches, dizziness, & light headedness. States it's getting worse.   Preference of office, provider, location: N/A  If referral order, have you been seen by this specialty before? Yes, patient states she has been seen at [facility] before in regards to this issue.  (If Yes, this issue or another issue? When? Where?  Can we respond through MyChart? Yes   Spoke with patient and informed her that a referral was recently placed by another NP on 11/17/2023 for neurology. Patient states that the first available for [facility] is in July and she needs a sooner appointment, requesting referral to [facility] for dizziness and lightheadedness, which is affecting her walking. Patient states she was seen at [facility] before

## 2023-12-01 ENCOUNTER — Other Ambulatory Visit: Payer: Self-pay | Admitting: Family Medicine

## 2023-12-01 DIAGNOSIS — K529 Noninfective gastroenteritis and colitis, unspecified: Secondary | ICD-10-CM

## 2023-12-05 ENCOUNTER — Other Ambulatory Visit: Payer: Self-pay | Admitting: Family

## 2023-12-05 ENCOUNTER — Telehealth: Payer: Self-pay | Admitting: Family

## 2023-12-05 DIAGNOSIS — R051 Acute cough: Secondary | ICD-10-CM

## 2023-12-05 DIAGNOSIS — K529 Noninfective gastroenteritis and colitis, unspecified: Secondary | ICD-10-CM

## 2023-12-05 DIAGNOSIS — K219 Gastro-esophageal reflux disease without esophagitis: Secondary | ICD-10-CM

## 2023-12-05 MED ORDER — MONTELUKAST SODIUM 10 MG PO TABS: 10.0000 mg | ORAL_TABLET | Freq: Every day | ORAL | 1 refills | Status: DC

## 2023-12-05 MED ORDER — FAMOTIDINE 20 MG PO TABS: 20.0000 mg | ORAL_TABLET | Freq: Two times a day (BID) | ORAL | 1 refills | Status: DC

## 2023-12-05 MED ORDER — PANTOPRAZOLE SODIUM 40 MG PO TBEC: 40.0000 mg | DELAYED_RELEASE_TABLET | Freq: Two times a day (BID) | ORAL | 1 refills | Status: DC

## 2023-12-05 NOTE — Telephone Encounter (Signed)
 Copied from CRM [id number]. Topic: Referral - Question >> Dec 05, 2023  9:11 AM Hector Shade B wrote: Reason for CRM: Patient is needing assistance with a referral which was sent over to [facility]. Patient states that she doesn't have an appointment until July, and that's very concerning because she is experiencing migraines, dizziness, and being off balance on a daily basis and would like to see if she can see a neurologist through [facility]. Please call patient at [phone]    This is not a patient of Internal Medicine.

## 2023-12-05 NOTE — Telephone Encounter (Signed)
 Copied from CRM [id number]. Topic: Clinical - Medication Question >> Dec 05, 2023  9:06 AM Hector Shade B wrote: Reason for CRM: [phone] Patient has a concerned about her medications. She was told by the pharmacy because of it being mailorder and she has just established car with her provider, she needs to speak with someone in regards to showing establishment with new provider. Please call patient to assist with questions about the prescriptions.   Please verify if message was directed to CRM or to provider's office? Patient established care here on 11/28/2023.

## 2023-12-05 NOTE — Telephone Encounter (Signed)
 I called patient and pend all medications she requested refill on. Please provide refills on medications that are as needed. Patient also stated that referral to Neurology cannot get her in until July. So she would like another referral placed to [facility] instead to see if they can get her in sooner. Message routed back to PCP Ngetich, Donalee Citrin, NP

## 2023-12-06 MED ORDER — BENZONATATE 100 MG PO CAPS: 100.0000 mg | ORAL_CAPSULE | Freq: Three times a day (TID) | ORAL | 0 refills | Status: DC | PRN

## 2023-12-06 MED ORDER — ONDANSETRON 4 MG PO TBDP: 4.0000 mg | ORAL_TABLET | Freq: Three times a day (TID) | ORAL | 0 refills | Status: DC | PRN

## 2023-12-06 NOTE — Telephone Encounter (Signed)
Carla please review.

## 2023-12-06 NOTE — Telephone Encounter (Signed)
 Please forward referral to [facility] as requested.

## 2023-12-06 NOTE — Telephone Encounter (Signed)
 Spoke to Bonnie Hall. Let her know that Dr. Karel Jarvis is reviewing her referral and they will contact her once a determination is made.

## 2023-12-06 NOTE — Telephone Encounter (Signed)
 Message and medications re-forwarded to PCP Ngetich, Donalee Citrin, NP

## 2023-12-07 NOTE — Telephone Encounter (Signed)
 I spoke with patient yesterday, 12/06/23. I updated her on the status of her referral being reviewed by Dr. Karel Jarvis. They will call her once the provider has instructed the referral coordinator as to whether they will be able to treat her.

## 2023-12-10 ENCOUNTER — Other Ambulatory Visit: Payer: Self-pay | Admitting: Family Medicine

## 2023-12-12 ENCOUNTER — Telehealth: Payer: Self-pay

## 2023-12-12 NOTE — Telephone Encounter (Signed)
 Patient called and notified.

## 2023-12-12 NOTE — Telephone Encounter (Signed)
 Copied from CRM [id number]. Topic: Clinical - Medical Advice >> Dec 12, 2023  2:59 PM Hamdi H wrote: Reason for CRM: Patient would like a nurse to call her back to explain the fasting lab requirements.

## 2023-12-21 NOTE — Progress Notes (Addendum)
 12/21/2023 Bonnie Hall [id number] [birth year]   Chief Complaint: Nausea, upper and lower abdominal pain, loose stools  History of Present Illness: Bonnie Hall is a 63 year old female with a past medical history of anxiety, depression, migraine headaches, arthritis, asthma, IDA, GERD and colon polyps. She is known by Dr. Bridgett Hall.   She presented to the ED 10/23/2023 with N/V, epigastric and abdominal pain. She also noted having diarrhea for a few days. Troponin levels were negative. Chest xray was negative.  RUQ sonogram showed a normal liver and gallbladder. CTAP showed evidence of colitis to the cecum and ascending colon.  She was prescribed Augmentin  875mg  bid x 2 weeks, Oxycodone  PRN and Ondansetron  PRN and was discharged home.  No prior history of colitis.    She presents today for further evaluation regarding generalized abdominal pain and diarrhea.  Since she completed the course of Augmentin  as noted above, she continues to have intermittent generalized abdominal pain, more noticeable to the lower abdomen than upper abdomen with loose mud like stools several times daily.  She stated having watery diarrhea 09/2023 which occurred shortly after eating any food.  No specific food triggers.  No bloody diarrhea.    She has a history of GERD. She was seen by her PCP 07/2023 and underwent H. pylori breath test due to having upper abdominal discomfort and burping which was negative.  She was prescribed Sucralfate  3 times daily with no improvement then subsequently was treated with Famotidine  20 mg daily.  She remains on Pantoprazole  40 mg twice daily.  She denies having any heartburn or dysphagia.  She had frequent headaches and took Ibuprofen  200 mg 2 tabs daily for 4 to 6 months which she discontinued 10/2023.  She also endorses having chronic nosebleeds for many years.  She underwent a colonoscopy 09/08/2022 which identified 1 tubular adenomatous polyp removed from the descending  colon and 1 hyperplastic polyp removed from the distal sigmoid colon.  She was advised to repeat a colonoscopy in 7 years.  No known family history of colon polyps, colorectal cancer or IBD.     Latest Ref Rng & Units 11/29/2023    8:16 AM 10/23/2023    5:15 PM 08/05/2023   11:26 AM  CBC  WBC 3.8 - 10.8 Thousand/uL 7.8  17.7  6.7   Hemoglobin 11.7 - 15.5 g/dL 19.1  47.8  29.5   Hematocrit 35.0 - 45.0 % 40.0  39.2  39.7   Platelets 140 - 400 Thousand/uL 431  338  464        Latest Ref Rng & Units 11/29/2023    8:16 AM 10/23/2023    5:15 PM 08/05/2023   11:26 AM  CMP  Glucose 65 - 99 mg/dL 94  621  95   BUN 7 - 25 mg/dL 16  19  18    Creatinine 0.50 - 1.05 mg/dL 3.08  6.57  8.46   Sodium 135 - 146 mmol/L 138  131  139   Potassium 3.5 - 5.3 mmol/L 4.8  3.5  4.9   Chloride 98 - 110 mmol/L 101  97  102   CO2 20 - 32 mmol/L 30  25  28    Calcium  8.6 - 10.4 mg/dL 9.4  9.1  9.7   Total Protein 6.1 - 8.1 g/dL 6.7  7.0  7.0   Total Bilirubin 0.2 - 1.2 mg/dL 0.6  0.7  0.3   Alkaline Phos 38 - 126 U/L  46  AST 10 - 35 U/L 11  14  11    ALT 6 - 29 U/L 8  13  10     Lipase 26  CTAP 10/23/2023: FINDINGS: Lower chest: No acute abnormality.   Hepatobiliary: No focal liver abnormality is seen. No gallstones, gallbladder wall thickening, or biliary dilatation.   Pancreas: Unremarkable. No pancreatic ductal dilatation or surrounding inflammatory changes.   Spleen: Normal in size without focal abnormality.   Adrenals/Urinary Tract: Adrenal glands are unremarkable. Kidneys are normal, without renal calculi, focal lesion, or hydronephrosis. The urinary bladder is poorly distended and subsequently limited in evaluation.   Stomach/Bowel: Stomach is within normal limits. The appendix is not clearly identified. No evidence of bowel dilatation. Mild thickening of the cecum and ascending colon is noted.   Vascular/Lymphatic: Aortic atherosclerosis. No enlarged abdominal or pelvic lymph nodes.    Reproductive: Uterus and bilateral adnexa are unremarkable.   Other: No abdominal wall hernia or abnormality. No abdominopelvic ascites.   Musculoskeletal: There is stable grade 1 anterolisthesis of the L4 vertebral body on L5. No acute osseous abnormality is identified.   IMPRESSION: 1. Mild thickening of the cecum and ascending colon which may represent sequelae associated with mild colitis. 2. Stable grade 1 anterolisthesis of the L4 vertebral body on L5. 3. Aortic atherosclerosis.  RUQ SONO 10/23/2023: FINDINGS: Gallbladder:   No gallstones or wall thickening visualized. No sonographic Murphy sign noted by sonographer.   Common bile duct:   Diameter: 2.3 mm   Liver:   No focal lesion identified. Within normal limits in parenchymal echogenicity. Portal vein is patent on color Doppler imaging with normal direction of blood flow towards the liver.   Other: None.   IMPRESSION: No cholelithiasis or sonographic evidence for acute cholecystitis.   PAST GI PROCEDURES:   Colonoscopy 09/08/2022: - One 5 mm polyp in the descending colon, removed with a cold snare. Resected and retrieved.  - One 7 mm polyp in the distal sigmoid colon, removed with a cold snare. Resected and retrieved.  - Small internal hemorrhoids. - 7 year recall  1. Surgical [P], colon, descending polyp x1, polyp (1) - TUBULAR ADENOMA (MULTIPLE FRAGMENTS) - NEGATIVE FOR HIGH-GRADE DYSPLASIA OR MALIGNANCY 2. Surgical [P], colon, sigmoid polyp x1, polyp (1) - COLONIC MUCOSA WITH HYPERPLASTIC CHANGES AND UNDERLYING REACTIVE LYMPHOID AGGREGATE - NEGATIVE FOR MALIGNANCY   Past Medical History:  Diagnosis Date   Allergy    Hay fever/exercise induced asthma   AMA (advanced maternal age) multigravida 35+    Anxiety    on meds   Arthritis    generalized   Asthma    Blood transfusion 1990's   Colitis 10/23/2023   Depression    bipolar=on meds   GERD (gastroesophageal reflux disease)    on meds    Hyperlipidemia    on meds   Iron deficiency anemia 1990's   negative work up as to etiology; required iron infusion, blood transfusion.  Saw an hematologist  at [facility].    Left-sided Bell's palsy 02/03/2021   Migraine    Newborn product of in vitro fertilization (IVF) pregnancy    Pregnancy induced hypertension    on meds   Preterm labor    Sleep apnea    no CPAP   Thrombocytosis    Vaginal Pap smear, abnormal    Past Surgical History:  Procedure Laterality Date   CESAREAN SECTION  2009   CESAREAN SECTION N/A 03/09/2017   Procedure: REPEAT CESAREAN SECTION;  Surgeon: Thurman Flores, MD;  Location: [facility];  Service: Obstetrics;  Laterality: N/A;   COLONOSCOPY  06/2012   Dr.Kaplan-MAC-movi(exc)-normal-10 yr recall   GYNECOLOGIC CRYOSURGERY     NASAL SEPTUM SURGERY  1983   TONSILLECTOMY  1966    Past Medical History:  Diagnosis Date   Allergy    Hay fever/exercise induced asthma   AMA (advanced maternal age) multigravida 35+    Anxiety    on meds   Arthritis    generalized   Asthma    Blood transfusion 1990's   Colitis 10/23/2023   Depression    bipolar=on meds   GERD (gastroesophageal reflux disease)    on meds   Hyperlipidemia    on meds   Iron deficiency anemia 1990's   negative work up as to etiology; required iron infusion, blood transfusion.  Saw an hematologist  at [facility].    Left-sided Bell's palsy 02/03/2021   Migraine    Newborn product of in vitro fertilization (IVF) pregnancy    Pregnancy induced hypertension    on meds   Preterm labor    Sleep apnea    no CPAP   Thrombocytosis    Vaginal Pap smear, abnormal    Past Surgical History:  Procedure Laterality Date   CESAREAN SECTION  2009   CESAREAN SECTION N/A 03/09/2017   Procedure: REPEAT CESAREAN SECTION;  Surgeon: Thurman Flores, MD;  Location: [facility]IRTH[facility] SU[facility];  Service: Obstetrics;  Laterality: N/A;   COLONOSCOPY  06/2012   Dr.Kaplan-MAC-movi(exc)-normal-10 yr recall    GYNECOLOGIC CRYOSURGERY     NASAL SEPTUM SURGERY  1983   TONSILLECTOMY  1966   Current Outpatient Medications on File Prior to Visit  Medication Sig Dispense Refill   ALPRAZolam (XANAX) 1 MG tablet Take 1 mg by mouth at bedtime as needed.     atorvastatin  (LIPITOR) 10 MG tablet Take 1 tablet (10 mg total) by mouth daily. 90 tablet 3   benzonatate  (TESSALON  PERLES) 100 MG capsule Take 1 capsule (100 mg total) by mouth 3 (three) times daily as needed. 30 capsule 0   erythromycin  ophthalmic ointment Apply to eye.     famotidine  (PEPCID ) 20 MG tablet Take 1 tablet (20 mg total) by mouth 2 (two) times daily. 180 tablet 1   lamoTRIgine  (LAMICTAL ) 200 MG tablet Take 300 mg by mouth daily.     lidocaine  (LIDODERM ) 5 % Place 1 patch onto the skin daily. Remove & Discard patch within 12 hours or as directed by MD 30 patch 0   montelukast  (SINGULAIR ) 10 MG tablet Take 1 tablet (10 mg total) by mouth at bedtime. 90 tablet 1   ondansetron  (ZOFRAN -ODT) 4 MG disintegrating tablet Take 1 tablet (4 mg total) by mouth every 8 (eight) hours as needed for nausea or vomiting. 30 tablet 0   pantoprazole  (PROTONIX ) 40 MG tablet Take 1 tablet (40 mg total) by mouth 2 (two) times daily. 180 tablet 1   promethazine -phenylephrine  (PROMETHAZINE  VC) 6.25-5 MG/5ML SYRP Take 5 mLs by mouth every 4 (four) hours as needed for congestion.     vortioxetine HBr (TRINTELLIX) 20 MG TABS tablet Take 20 mg by mouth daily.     clonazePAM (KLONOPIN) 1 MG tablet Take 0.75 mg by mouth daily.     magic mouthwash (nystatin , lidocaine , diphenhydrAMINE , alum & mag hydroxide) suspension Swish and spit 5 mLs 3 (three) times daily. (Patient not taking: Reported on 12/22/2023) 180 mL 0   traZODone (DESYREL) 100 MG tablet Take 200 mg by mouth at bedtime.  Current Facility-Administered Medications on File Prior to Visit  Medication Dose Route Frequency Provider Last Rate Last Admin   0.9 %  sodium chloride  infusion  500 mL Intravenous Once  Cunningham, Scott E, MD       No Known Allergies  Current Medications, Allergies, Past Medical History, Past Surgical History, Family History and Social History were reviewed in [employer] record.  Review of Systems:   Constitutional: Negative for fever, sweats, chills or weight loss.  Respiratory: Negative for shortness of breath.   Cardiovascular: Negative for chest pain, palpitations and leg swelling.  Gastrointestinal: See HPI.  Musculoskeletal: Negative for back pain or muscle aches.  Neurological: Negative for dizziness, headaches or paresthesias.   Physical Exam:  BP 126/80   Pulse 77   Ht 5\' 6"  (1.676 m)   Wt 139 lb (63 kg)   LMP 03/12/2012   BMI 22.44 kg/m   Wt Readings from Last 3 Encounters:  12/22/23 139 lb (63 kg)  11/28/23 139 lb 9.6 oz (63.3 kg)  11/17/23 142 lb (64.4 kg)    General: 63 year old female in no acute distress. Head: Normocephalic and atraumatic. Eyes: No scleral icterus. Conjunctiva pink . Ears: Normal auditory acuity. Mouth: Dentition intact. No ulcers or lesions.  Lungs: Clear throughout to auscultation. Heart: Regular rate and rhythm, no murmur. Abdomen: Soft, nontender and nondistended. No masses or hepatomegaly. Normal bowel sounds x 4 quadrants.  Rectal: Deferred.  Musculoskeletal: Symmetrical with no gross deformities. Extremities: No edema. Neurological: Alert oriented x 4. No focal deficits.  Psychological: Alert and cooperative. Normal mood and affect  Assessment and Recommendations:  63 year old female with generalized abdominal pain and diarrhea. CTAP done in the ED 10/23/2023 identified colon wall thickening to the cecum and ascending colon concerning for colitis treated with a course of Augmentin .  She continues to have lower abdominal pain and diarrhea.  No fevers. - CBC, CMP, CRP - Fecal calprotectin and Diatherix C. Difficile - Push fluids, bland diet - Repeat CTAP due to persistent generalized  abdominal pain, lower abdomen > upper abdomen, assess for progressive colitis.  - Await the above lab and CTAP results, she may require a future diagnostic colonoscopy - Dicyclomine  10 mg 1 tab p.o. every 8 hours, stop if dizziness/lightheadedness occurs - No Ibuprofen   GERD, epigastric pain. RUQ sono 10/23/2023 showed a normal gallbladder.  - Continue Pantoprazole  40 mg twice daily - EGD if epigastric pain persists or worsens   History of colon polyps.  One tubular adenomatous polyp and one hyperplastic polyp removed from the colon per colonoscopy 08/2022. - Next colon polyp surveillance colonoscopy due 08/2029  ADDENDUM: Refer to phone note 01/12/2024 as follows: n review of her Epic records, she was seen by neurology Dr. Dotty Gee 01/05/2024 d/t having headaches, dizziness and balance issues with falls. She has a brain MRI scheduled 01/16/2024. Started on Gabapentin  Q HS   Brain MRI 01/23/2024: Mild white matter changes primarily in the frontal lobes for which differential would include early chronic microvascular ischemia, chronic migraines or vasculitis. Old microhemorrhage in the right parietal lobe.  Neurology clearance received from Dr. Dotty Gee, see phone note 01/26/2024 as follows:  "From a neurologic standpoint, she is cleared"   Patient to proceed with EGD and colonoscopy with Dr. Bridgett Hall as scheduled 02/15/2024

## 2023-12-22 ENCOUNTER — Other Ambulatory Visit

## 2023-12-22 ENCOUNTER — Encounter: Payer: Self-pay | Admitting: Neurology

## 2023-12-22 ENCOUNTER — Encounter: Payer: Self-pay | Admitting: Nurse Practitioner

## 2023-12-22 ENCOUNTER — Ambulatory Visit (INDEPENDENT_AMBULATORY_CARE_PROVIDER_SITE_OTHER): Admitting: Nurse Practitioner

## 2023-12-22 VITALS — BP 126/80 | HR 77 | Ht 66.0 in | Wt 139.0 lb

## 2023-12-22 DIAGNOSIS — R197 Diarrhea, unspecified: Secondary | ICD-10-CM

## 2023-12-22 DIAGNOSIS — K219 Gastro-esophageal reflux disease without esophagitis: Secondary | ICD-10-CM | POA: Diagnosis not present

## 2023-12-22 DIAGNOSIS — R1084 Generalized abdominal pain: Secondary | ICD-10-CM

## 2023-12-22 DIAGNOSIS — Z860101 Personal history of adenomatous and serrated colon polyps: Secondary | ICD-10-CM

## 2023-12-22 DIAGNOSIS — R1013 Epigastric pain: Secondary | ICD-10-CM | POA: Diagnosis not present

## 2023-12-22 DIAGNOSIS — R1030 Lower abdominal pain, unspecified: Secondary | ICD-10-CM | POA: Diagnosis not present

## 2023-12-22 DIAGNOSIS — K529 Noninfective gastroenteritis and colitis, unspecified: Secondary | ICD-10-CM

## 2023-12-22 DIAGNOSIS — Z860102 Personal history of hyperplastic colon polyps: Secondary | ICD-10-CM

## 2023-12-22 LAB — C-REACTIVE PROTEIN: CRP: <1 mg/dL (ref 0.5–20.0)

## 2023-12-22 LAB — COMPREHENSIVE METABOLIC PANEL WITH GFR
ALT: 8 U/L (ref 0–35)
AST: 11 U/L (ref 0–37)
Albumin: 4.5 g/dL (ref 3.5–5.2)
Alkaline Phosphatase: 53 U/L (ref 39–117)
BUN: 15 mg/dL (ref 6–23)
CO2: 31 meq/L (ref 19–32)
Calcium: 9.4 mg/dL (ref 8.4–10.5)
Chloride: 102 meq/L (ref 96–112)
Creatinine, Ser: 0.81 mg/dL (ref 0.40–1.20)
GFR: 78 mL/min (ref >60.00)
Glucose, Bld: 90 mg/dL (ref 70–99)
Potassium: 4.2 meq/L (ref 3.5–5.1)
Sodium: 139 meq/L (ref 135–145)
Total Bilirubin: 0.5 mg/dL (ref 0.2–1.2)
Total Protein: 6.8 g/dL (ref 6.0–8.3)

## 2023-12-22 LAB — CBC WITH DIFFERENTIAL/PLATELET
Basophils Absolute: 0.1 10*3/uL (ref 0.0–0.1)
Basophils Relative: 2.1 % (ref 0.0–3.0)
Eosinophils Absolute: 0.3 10*3/uL (ref 0.0–0.7)
Eosinophils Relative: 4.9 % (ref 0.0–5.0)
HCT: 40 % (ref 36.0–46.0)
Hemoglobin: 13.2 g/dL (ref 12.0–15.0)
Lymphocytes Relative: 25 % (ref 12.0–46.0)
Lymphs Abs: 1.3 10*3/uL (ref 0.7–4.0)
MCHC: 33.1 g/dL (ref 30.0–36.0)
MCV: 96.9 fl (ref 78.0–100.0)
Monocytes Absolute: 0.3 10*3/uL (ref 0.1–1.0)
Monocytes Relative: 6.7 % (ref 3.0–12.0)
Neutro Abs: 3.2 10*3/uL (ref 1.4–7.7)
Neutrophils Relative %: 61.3 % (ref 43.0–77.0)
Platelets: 389 10*3/uL (ref 150.0–400.0)
RBC: 4.12 Mil/uL (ref 3.87–5.11)
RDW: 14.2 % (ref 11.5–15.5)
WBC: 5.2 10*3/uL (ref 4.0–10.5)

## 2023-12-22 MED ORDER — DICYCLOMINE HCL 10 MG PO CAPS: 10.0000 mg | ORAL_CAPSULE | Freq: Three times a day (TID) | ORAL | 0 refills | Status: DC | PRN

## 2023-12-22 NOTE — Patient Instructions (Signed)
 You have been scheduled for a CT scan of the abdomen and pelvis at [facility], 1st floor Radiology. You are scheduled on 12/28/23 at 1:30 pm. You should arrive 15-20 minutes prior to your appointment time for registration.    If you have any questions regarding your exam or if you need to reschedule, you may call [facility] at [phone] between the hours of 8:00 am and 5:00 pm, Monday-Friday.   Your provider has requested that you go to the basement level for lab work before leaving today. Press "B" on the elevator. The lab is located at the first door on the left as you exit the elevator.  Your provider has ordered "Diatherix" stool testing for you. You have received a kit from our office today containing all necessary supplies to complete this test. Please carefully read the stool collection instructions provided in the kit before opening the accompanying materials. In addition, be sure there is a label providing your full name and date of birth on the "puritan opti-swab" tube that is supplied in the kit (if you do not see a label with this information on your test tube, please make [place] aware before test collection!). After completing the test, you should secure the purtian tube into the specimen biohazard bag. The [facility] E-Req sheet (including date and time of specimen collection) should be placed into the outside pocket of the specimen biohazard bag and returned to the [place] lab (basement floor of [facility]) within 3 days of collection. Please make sure to give the specimen to a staff member at the lab. DO NOT leave the specimen on the counter.   If the specimen date and time (can be found in the upper right boxed portion of the sheet) are not filled out on the E-Req sheet, the test will NOT be performed.   Due to recent changes in healthcare laws, you may see the results of your imaging and laboratory studies on MyChart before your provider  has had a chance to review them.  We understand that in some cases there may be results that are confusing or concerning to you. Not all laboratory results come back in the same time frame and the provider may be waiting for multiple results in order to interpret others.  Please give [place] 48 hours in order for your provider to thoroughly review all the results before contacting the office for clarification of your results.   Thank you for trusting me with your gastrointestinal care!   Alcide Evener, CRNP

## 2023-12-23 ENCOUNTER — Other Ambulatory Visit: Payer: Self-pay

## 2023-12-23 ENCOUNTER — Telehealth: Payer: Self-pay

## 2023-12-23 DIAGNOSIS — R1084 Generalized abdominal pain: Secondary | ICD-10-CM

## 2023-12-23 DIAGNOSIS — R197 Diarrhea, unspecified: Secondary | ICD-10-CM

## 2023-12-23 DIAGNOSIS — K529 Noninfective gastroenteritis and colitis, unspecified: Secondary | ICD-10-CM

## 2023-12-23 NOTE — Telephone Encounter (Signed)
 Copied from CRM [id number]. Topic: General - Other >> Dec 23, 2023 12:05 PM Adrianna P wrote: Reason for CRM: patient is unsure if her four month labs are fasting or non fasting please contact patient

## 2023-12-23 NOTE — Telephone Encounter (Signed)
 Noted.

## 2023-12-23 NOTE — Telephone Encounter (Signed)
 Just to confirm. Patient doesn't need lab before 6 month follow up? AVS says nothing about labs prior to appointment. Only to follow up in 6 months and to have labs soon. Someone outside our office scheduled the patient to have labs in the middle of July when she returns 01/04/2024 for fasting labs and has 6 month follow up scheduled for September. Message routed back to PCP Ngetich, Donalee Citrin, NP

## 2023-12-23 NOTE — Telephone Encounter (Signed)
 There is confusion because the patient called back to be scheduled and she was told differently by call center. She was told to come back fasting for Hepatic Panel and come back Non fasting for 4 month labs. I have spoken to patient and corrected this error. Message routed back to PCP Ngetich, Donalee Citrin, NP

## 2023-12-23 NOTE — Telephone Encounter (Signed)
 According to last month 11/29/2023 lab results patient was to start on atrovastatin 10 mg tablet then have Hepatic in 4 weeks and Fasting lipid panel done in 4 months.

## 2023-12-24 DIAGNOSIS — Z419 Encounter for procedure for purposes other than remedying health state, unspecified: Secondary | ICD-10-CM | POA: Diagnosis not present

## 2023-12-24 LAB — CALPROTECTIN, FECAL: Calprotectin, Fecal: 84 ug/g (ref 0–120)

## 2023-12-28 ENCOUNTER — Ambulatory Visit (HOSPITAL_COMMUNITY)

## 2023-12-30 ENCOUNTER — Ambulatory Visit (HOSPITAL_COMMUNITY)
Admission: RE | Admit: 2023-12-30 | Discharge: 2023-12-30 | Disposition: A | Discharge status: Home / Self Care | Source: Ambulatory Visit | Attending: Nurse Practitioner

## 2023-12-30 DIAGNOSIS — R1084 Generalized abdominal pain: Secondary | ICD-10-CM | POA: Insufficient documentation

## 2023-12-30 MED ORDER — SODIUM CHLORIDE (PF) 0.9 % IJ SOLN
INTRAMUSCULAR | Status: AC
  Filled 2023-12-30: qty 50

## 2023-12-30 MED ORDER — IOHEXOL 300 MG/ML  SOLN
100.0000 mL | Freq: Once | INTRAMUSCULAR | Status: AC | PRN
  Administered 2023-12-30: 100 mL via INTRAVENOUS

## 2024-01-02 ENCOUNTER — Telehealth: Payer: Self-pay | Admitting: Nurse Practitioner

## 2024-01-02 DIAGNOSIS — R197 Diarrhea, unspecified: Secondary | ICD-10-CM

## 2024-01-02 MED ORDER — HYOSCYAMINE SULFATE 0.125 MG SL SUBL: SUBLINGUAL_TABLET | SUBLINGUAL | 0 refills | Status: DC

## 2024-01-02 NOTE — Telephone Encounter (Signed)
 Patient is advised that we have requested expedited reading from radiology and will reach out with results. In addition, patient is advised that we will send hyoscyamine  for her to use in place of dicyclomine  to see if this gives her more relief. She verbalizes understanding. She is reminded of ER precautions.

## 2024-01-02 NOTE — Telephone Encounter (Signed)
 Bonnie Hall, I called [facility] (spoke with Gaylin Ke) and requested an urgent read on her CTAP which was completed on 12/30/2023. Please let patient know that I will contact her once I receive the results. Pls send RX for Hyoscyamine  0.125mg  one tab SL Q 6 to 8 hrs PRN for abdominal pain # 30, no refills. As previously noted, agree with plan for patient to go to the ED if her abdominal pain worsens.  Dr. Bridgett Camps, fecal calprotectin level was normal. Pls let me know if you have other recommendations as we await CTAP results .THX.

## 2024-01-02 NOTE — Telephone Encounter (Signed)
 Left message for patient to call back

## 2024-01-02 NOTE — Telephone Encounter (Signed)
 Patient states she is still experiencing abdominal pain. Requesting to speak with a nurse.

## 2024-01-02 NOTE — Telephone Encounter (Signed)
 Patient calls indicating that despite the addition of dicyclomine  10 mg every 8 hours, she continues to have generalized abdominal pain. States that this pain goes to her breastbone and down into the groin. Describes this as as intermittent and at times dull, at times sharp. "Doubles me over." Denies diarrhea or other bowel changes at this time. Patient requests "something different for pain."  I have advised patient that we are currently awaiting CT results and have been seeing about a 2.5 week turn around time for results but explained we will reach out as soon as available.  She was previously advised to go to the ER over the weekend for any severe abdominal pain or diarrhea but wrote back saying she "wont go to the ER again because there is nothing they can do. They've already run all the tests that they can."  Please advise with any additional recommendations

## 2024-01-03 NOTE — Telephone Encounter (Signed)
 I have reviewed the CT scan and it shows no concerning features; no colitis; no significant inflammation in the abdomen or pelvis to explain her symptoms At this point I agree with her antispasmodic Would repeat the fecal calprotectin and if it still is borderline or elevated colonoscopy would likely be the next step for abdominal pain This may slowly improve is her gut microbiome rebalances postinfectious

## 2024-01-04 ENCOUNTER — Other Ambulatory Visit

## 2024-01-04 DIAGNOSIS — R7989 Other specified abnormal findings of blood chemistry: Secondary | ICD-10-CM

## 2024-01-04 DIAGNOSIS — T50905A Adverse effect of unspecified drugs, medicaments and biological substances, initial encounter: Secondary | ICD-10-CM

## 2024-01-04 DIAGNOSIS — E785 Hyperlipidemia, unspecified: Secondary | ICD-10-CM

## 2024-01-04 NOTE — Addendum Note (Signed)
 Addended by: Glennette Lanius on: 01/04/2024 11:22 AM   Modules accepted: Orders

## 2024-01-04 NOTE — Progress Notes (Unsigned)
 NEUROLOGY CONSULTATION NOTE  DELLAR TRABER MRN: [id number] DOB: Aug 23, 1961  Referring provider: Christean Courts, NP Primary care provider: Christean Courts, NP  Reason for consult:  headache and dizziness  Assessment/Plan:   Possible nummular headache, atypical as it is frontal rather than unilateral parietal Lightheadedness/near-syncope - query if related to hypotension.  While she normally has normal blood pressure, blood pressure today was unusually low while experiencing a spell.   Sudden episodic gait instability - unclear etiology.  No consistent with BPPV.  Not associated with sudden leg weakness or other focal abnormality Migraine without aura, without status migrainosus, not intractable   Check MRI/MRA of brain to evaluate for secondary causes of new onset headaches and gait instability For treatment of headache, start gabapentin 100mg  at bedtime. We can titrate to 300mg  at bedtime in 4 weeks if needed. Further recommendations pending results.  Otherwise, follow up 6 months.  Total time spent in chart and face to face with patient:  47 minutes   Subjective:  Bonnie Hall is a 63 year old female with HLD, asthma, colitis, Bipolar disorder, depression and anxiety, Meig's syndrome and history of left-sided Bell's palsy who presents for headache and dizziness.  History supplemented by internal medicine notes.  She was hospitalized on February 9 for abdominal pain, nausea and diarrhea and was diagnosed with colitis.  Since then, she has had a persistent dull small circumscribed pressure/throbbing pain in the frontal region between or above either eyebrow.  Occurs daily and lasts all day.  No associated photophobia, phonophobia or visual disturbance.  Has nausea due to colitis but not specifically associated with these headaches.  No specific triggers or exacerbating factors.  Does not respond to her Fioricet or OTC analgesics such as Tylenol  or ibuprofen .  She has cervical  spondylosis with some neck pain and underwent chiropractic manipulation which did not  help the headache  At the same time, she has been experiencing lightheadedness, a sensation that she is going to pass out.  No associated tunnel vision, diaphoresis, chest pain or palpitations.  Not positional and may occur any time, even just while quietly sitting.  It has never happened while driving.  Occurs daily.    She also endorses brief episodes of dizziness.  While walking, she may suddenly lose balance and veer to either side.  Lasts seconds to a couple of minutes.  No associated dizziness or similar sensation in her head.  Leg does not give out.  It just happens.  It has caused her to bump into walls and furniture.  This also occurs daily.  She has longstanding history of migraines that are currently well controlled.  They usually start in back of head or front and radiates diffusely, a severe pounding pain associated with nausea, vomiting, photophobia and phonophobia.  If Fioricet taken early, gets relief in 15 minutes.     She had an unremarkable MRI of the brain without contrast on 01/28/2021 for left sided facial weakness which was ultimately found to be Bell's palsy.    Past NSAIDS/analgesics:  none Past anti-emetic:  promethazine  25mg  Past antihypertensive medications:  labetolol Past antidepressant medications:  Wellbutrin   Current NSAIDS/analgesics:  Fioricet, Tylenol , ibuprofen  Current triptans:  none Current ergotamine:  none Current anti-emetic:  Zofran -ODT 4mg  Current muscle relaxants:  none Current Antihypertensive medications:  none Current Antidepressant medications:  Trintellix Current Anticonvulsant medications:  lamotrigine  300mg  daily Current anti-CGRP:  none Other medications:  alprazolam   PAST MEDICAL HISTORY: Past Medical History:  Diagnosis Date   Allergy    Hay fever/exercise induced asthma   AMA (advanced maternal age) multigravida 35+    Anxiety    on meds    Arthritis    generalized   Asthma    Blood transfusion 1990's   Colitis 10/23/2023   Depression    bipolar=on meds   GERD (gastroesophageal reflux disease)    on meds   Hyperlipidemia    on meds   Iron deficiency anemia 1990's   negative work up as to etiology; required iron infusion, blood transfusion.  S[facility] an hematologist  at [facility].    Left-sided Bell's palsy 02/03/2021   Migraine    Newborn product of in vitro fertilization (IVF) pregnancy    Pregnancy induced hypertension    on meds   Preterm labor    Sleep apnea    no CPAP   Thrombocytosis    Vaginal Pap smear, abnormal     PAST SURGICAL HISTORY: Past Surgical History:  Procedure Laterality Date   CESAREAN SECTION  2009   CESAREAN SECTION N/A 03/09/2017   Procedure: REPEAT CESAREAN SECTION;  Surgeon: Thurman Flores, MD;  Location: [facility] BIRTH[facility] SUIT[facility];  Service: Obstetrics;  Laterality: N/A;   COLONOSCOPY  06/2012   Dr.Kaplan-MAC-movi(exc)-normal-10 yr recall   GYNECOLOGIC CRYOSURGERY     NASAL SEPTUM SURGERY  1983   TONSILLECTOMY  1966    MEDICATIONS: Current Outpatient Medications on File Prior to Visit  Medication Sig Dispense Refill   ALPRAZolam (XANAX) 1 MG tablet Take 1 mg by mouth at bedtime as needed.     atorvastatin  (LIPITOR) 10 MG tablet Take 1 tablet (10 mg total) by mouth daily. 90 tablet 3   benzonatate  (TESSALON  PERLES) 100 MG capsule Take 1 capsule (100 mg total) by mouth 3 (three) times daily as needed. 30 capsule 0   clonazePAM (KLONOPIN) 1 MG tablet Take 0.75 mg by mouth daily.     erythromycin  ophthalmic ointment Apply to eye.     famotidine  (PEPCID ) 20 MG tablet Take 1 tablet (20 mg total) by mouth 2 (two) times daily. 180 tablet 1   hyoscyamine  (LEVSIN SL) 0.125 MG SL tablet Dissolve 1 tablet under the tongue every 6-8 hours as needed for abdominal pain 30 tablet 0   lamoTRIgine  (LAMICTAL ) 200 MG tablet Take 300 mg by mouth daily.     lidocaine  (LIDODERM ) 5 % Place 1 patch onto the skin  daily. Remove & Discard patch within 12 hours or as directed by MD 30 patch 0   magic mouthwash (nystatin , lidocaine , diphenhydrAMINE , alum & mag hydroxide) suspension Swish and spit 5 mLs 3 (three) times daily. (Patient not taking: Reported on 12/22/2023) 180 mL 0   montelukast  (SINGULAIR ) 10 MG tablet Take 1 tablet (10 mg total) by mouth at bedtime. 90 tablet 1   ondansetron  (ZOFRAN -ODT) 4 MG disintegrating tablet Take 1 tablet (4 mg total) by mouth every 8 (eight) hours as needed for nausea or vomiting. 30 tablet 0   pantoprazole  (PROTONIX ) 40 MG tablet Take 1 tablet (40 mg total) by mouth 2 (two) times daily. 180 tablet 1   promethazine -phenylephrine  (PROMETHAZINE  VC) 6.25-5 MG/5ML SYRP Take 5 mLs by mouth every 4 (four) hours as needed for congestion.     traZODone (DESYREL) 100 MG tablet Take 200 mg by mouth at bedtime.     vortioxetine HBr (TRINTELLIX) 20 MG TABS tablet Take 20 mg by mouth daily.     Current Facility-Administered Medications on File Prior to Visit  Medication  Dose Route Frequency Provider Last Rate Last Admin   0.9 %  sodium chloride  infusion  500 mL Intravenous Once Elois Hair, MD        ALLERGIES: No Known Allergies  FAMILY HISTORY: Family History  Problem Relation Age of Onset   Alzheimer's disease Mother 17   Prostate cancer Father    Arrhythmia Father    Hearing loss Daughter    Strabismus Daughter    Asperger's syndrome Daughter    ADD / ADHD Daughter    Intellectual disability Daughter    Polycystic ovary syndrome Daughter 23   Bipolar disorder Son    Colon cancer Paternal Uncle 75   Colon cancer Paternal Uncle 15   Stroke Maternal Grandmother    Stroke Maternal Grandfather    Rheum arthritis Paternal Grandmother    Colon polyps Neg Hx    Esophageal cancer Neg Hx    Stomach cancer Neg Hx    Rectal cancer Neg Hx     Objective:  Blood pressure (!) 100/54, pulse 70, height 5\' 6"  (1.676 m), weight 139 lb (63 kg), last menstrual period  03/12/2012, SpO2 97%. General: No acute distress.  Patient appears well-groomed.   Head:  Normocephalic/atraumatic Eyes:  fundi examined but not visualized Neck: supple, no paraspinal tenderness, full range of motion Heart: regular rate and rhythm Neurological Exam: Mental status: alert and oriented to person, place, and time, speech fluent and not dysarthric, language intact. Cranial nerves: CN I: not tested CN II: pupils equal, round and reactive to light, visual fields intact CN III, IV, VI:  full range of motion, no nystagmus, no ptosis CN V: facial sensation intact. CN VII: upper and lower face symmetric CN VIII: hearing intact CN IX, X: gag intact, uvula midline CN XI: sternocleidomastoid and trapezius muscles intact CN XII: tongue midline Bulk & Tone: normal, no fasciculations. Motor:  muscle strength 5/5 throughout Sensation:  Pinprick and vibratory sensation intact. Deep Tendon Reflexes:  2+ throughout,  toes downgoing.   Finger to nose testing:  Without dysmetria.   Gait:  Normal station and stride.  Able to turn and walk in tandem.  Romberg negative.    Thank you for allowing me to take part in the care of this patient.  Janne Members, DO  CC: Christean Courts, NP

## 2024-01-04 NOTE — Telephone Encounter (Signed)
 Patient has been advised of CT results and recommendations as per Dr Bridgett Camps. Patient to pick up calprotectin test kit and return asap. Patient states that she wants to continue testing until she "gets to the bottom of this." "I know my body and something is wrong." Patient is advised that should fecal calprotectin be elevated, we would likely move forward with colonoscopy. Patient states she has not yet tried hyoscyamine  and is waiting until she has significant pain to see if it is helpful.

## 2024-01-04 NOTE — Telephone Encounter (Signed)
 Dotty, pls contact patient and let her know Dr. Marietta Shorter recommendations below. Pls provider her with another order for a fecal calprotectin test (prior fecal calprotectin test did not show significant inflammation, showed borderline inflammation). If repeat fecal calprotectin level is elevated will need to schedule her for a diagnostic colonoscopy. THX.

## 2024-01-05 ENCOUNTER — Encounter: Payer: Self-pay | Admitting: Neurology

## 2024-01-05 ENCOUNTER — Ambulatory Visit (INDEPENDENT_AMBULATORY_CARE_PROVIDER_SITE_OTHER): Admitting: Neurology

## 2024-01-05 ENCOUNTER — Other Ambulatory Visit

## 2024-01-05 VITALS — BP 100/54 | HR 70 | Ht 66.0 in | Wt 139.0 lb

## 2024-01-05 DIAGNOSIS — R42 Dizziness and giddiness: Secondary | ICD-10-CM

## 2024-01-05 DIAGNOSIS — G43009 Migraine without aura, not intractable, without status migrainosus: Secondary | ICD-10-CM

## 2024-01-05 DIAGNOSIS — G4489 Other headache syndrome: Secondary | ICD-10-CM

## 2024-01-05 DIAGNOSIS — R55 Syncope and collapse: Secondary | ICD-10-CM

## 2024-01-05 DIAGNOSIS — R197 Diarrhea, unspecified: Secondary | ICD-10-CM

## 2024-01-05 LAB — HEPATIC FUNCTION PANEL
AG Ratio: 2.4 (calc) (ref 1.0–2.5)
ALT: 9 U/L (ref 6–29)
AST: 12 U/L (ref 10–35)
Albumin: 4.5 g/dL (ref 3.6–5.1)
Alkaline phosphatase (APISO): 51 U/L (ref 37–153)
Bilirubin, Direct: 0.1 mg/dL (ref 0.0–0.2)
Globulin: 1.9 g/dL (ref 1.9–3.7)
Indirect Bilirubin: 0.4 mg/dL (ref 0.2–1.2)
Total Bilirubin: 0.5 mg/dL (ref 0.2–1.2)
Total Protein: 6.4 g/dL (ref 6.1–8.1)

## 2024-01-05 LAB — CBC WITH DIFFERENTIAL/PLATELET
Absolute Lymphocytes: 1151 {cells}/uL (ref 850–3900)
Absolute Monocytes: 488 {cells}/uL (ref 200–950)
Basophils Absolute: 78 {cells}/uL (ref 0–200)
Basophils Relative: 1.2 %
Eosinophils Absolute: 371 {cells}/uL (ref 15–500)
Eosinophils Relative: 5.7 %
HCT: 40.1 % (ref 35.0–45.0)
Hemoglobin: 13 g/dL (ref 11.7–15.5)
MCH: 31.1 pg (ref 27.0–33.0)
MCHC: 32.4 g/dL (ref 32.0–36.0)
MCV: 95.9 fL (ref 80.0–100.0)
MPV: 10.5 fL (ref 7.5–12.5)
Monocytes Relative: 7.5 %
Neutro Abs: 4414 {cells}/uL (ref 1500–7800)
Neutrophils Relative %: 67.9 %
Platelets: 409 10*3/uL — ABNORMAL HIGH (ref 140–400)
RBC: 4.18 10*6/uL (ref 3.80–5.10)
RDW: 12.4 % (ref 11.0–15.0)
Total Lymphocyte: 17.7 %
WBC: 6.5 10*3/uL (ref 3.8–10.8)

## 2024-01-05 LAB — LIPID PANEL
Cholesterol: 160 mg/dL (ref <200)
HDL: 64 mg/dL (ref >=50)
LDL Cholesterol (Calc): 80 mg/dL
Non-HDL Cholesterol (Calc): 96 mg/dL (ref <130)
Total CHOL/HDL Ratio: 2.5 (calc) (ref <5.0)
Triglycerides: 82 mg/dL (ref <150)

## 2024-01-05 NOTE — Patient Instructions (Signed)
 Check MRI and MRA of head.  Further recommendations pending results.- Start gabapentin 100mg  at bedtime.  If no improvement in 4 weeks, contact me and we can increase dose. Limit use of pain relievers to no more than 9 days out of the month to prevent risk of rebound or medication-overuse headache. Follow up 6 months.

## 2024-01-06 ENCOUNTER — Other Ambulatory Visit: Payer: Self-pay | Admitting: Neurology

## 2024-01-06 ENCOUNTER — Telehealth: Payer: Self-pay | Admitting: Neurology

## 2024-01-06 MED ORDER — GABAPENTIN 100 MG PO CAPS: 100.0000 mg | ORAL_CAPSULE | Freq: Every day | ORAL | 5 refills | Status: DC

## 2024-01-06 NOTE — Telephone Encounter (Signed)
 Patient advised, Script has been sent over.

## 2024-01-06 NOTE — Telephone Encounter (Signed)
 Pt. Calling saying pharmacy never got Rx Gabapentin: [facility] [address], please call when script is sent

## 2024-01-08 LAB — CALPROTECTIN, FECAL: Calprotectin, Fecal: 118 ug/g (ref 0–120)

## 2024-01-10 ENCOUNTER — Telehealth: Payer: Self-pay | Admitting: Rheumatology

## 2024-01-10 NOTE — Telephone Encounter (Signed)
 Pt called requesting a change in her medication. Pt stated meloxicam  it is not helping her anymore and would like something stronger. Pt is requesting a call back.

## 2024-01-10 NOTE — Progress Notes (Unsigned)
 Office Visit Note  Patient: Bonnie Hall             Date of Birth: 01-22-1961           MRN: [id number]             PCP: Estil Heman, NP Referring: Estil Heman, NP Visit Date: 01/11/2024 Occupation: @GUAROCC @  Subjective:  Pain in multiple joints   History of Present Illness: Bonnie Hall is a 63 y.o. female with history of osteoarthritis.  Patient has not been seen since April 2024 by Dr. Alvira Josephs.  She was referred to pain management in July 2024.      Activities of Daily Living:  Patient reports morning stiffness for *** {minute/hour:19697}.   Patient {ACTIONS;DENIES/REPORTS:21021675::"Denies"} nocturnal pain.  Difficulty dressing/grooming: {ACTIONS;DENIES/REPORTS:21021675::"Denies"} Difficulty climbing stairs: {ACTIONS;DENIES/REPORTS:21021675::"Denies"} Difficulty getting out of chair: {ACTIONS;DENIES/REPORTS:21021675::"Denies"} Difficulty using hands for taps, buttons, cutlery, and/or writing: {ACTIONS;DENIES/REPORTS:21021675::"Denies"}  No Rheumatology ROS completed.   PMFS History:  Patient Active Problem List   Diagnosis Date Noted   Thrush, oral 11/25/2023   Acute cough 11/25/2023   Nonintractable headache 11/25/2023   Neck pain 11/25/2023   Establishing care with new doctor, encounter for 11/09/2023   Colitis 11/09/2023   Abdominal pain 11/09/2023   Herpes zoster vaccination declined 11/09/2023   Family history of Alzheimer's disease 04/14/2021   Aortic atherosclerosis (HCC) - per CT 01/30/2020 01/30/2020   Stress at home 10/01/2019   Caloric malnutrition (HCC) 10/01/2019   Mixed hyperlipidemia 07/31/2019   S/P cesarean section 03/09/2017   Abnormal glucose 01/16/2015   Vitamin D  deficiency 01/14/2014   Medication management 01/14/2014   Labile hypertension 01/14/2014   Allergy    Sleep apnea    Migraine    Bipolar disorder (manic depression) (HCC)    GERD (gastroesophageal reflux disease)     Past Medical History:  Diagnosis  Date   Allergy    Hay fever/exercise induced asthma   AMA (advanced maternal age) multigravida 35+    Anxiety    on meds   Arthritis    generalized   Asthma    Blood transfusion 1990's   Colitis 10/23/2023   Depression    bipolar=on meds   GERD (gastroesophageal reflux disease)    on meds   Hyperlipidemia    on meds   Iron deficiency anemia 1990's   negative work up as to etiology; required iron infusion, blood transfusion.  Saw an hematologist  at [facility].    Left-sided Bell's palsy 02/03/2021   Migraine    Newborn product of in vitro fertilization (IVF) pregnancy    Pregnancy induced hypertension    on meds   Preterm labor    Sleep apnea    no CPAP   Thrombocytosis    Vaginal Pap smear, abnormal     Family History  Problem Relation Age of Onset   Alzheimer's disease Mother 3   Prostate cancer Father    Arrhythmia Father    Hearing loss Daughter    Strabismus Daughter    Asperger's syndrome Daughter    ADD / ADHD Daughter    Intellectual disability Daughter    Polycystic ovary syndrome Daughter 4   Bipolar disorder Son    Colon cancer Paternal Uncle 52   Colon cancer Paternal Uncle 63   Stroke Maternal Grandmother    Stroke Maternal Grandfather    Rheum arthritis Paternal Grandmother    Colon polyps Neg Hx    Esophageal cancer Neg Hx  Stomach cancer Neg Hx    Rectal cancer Neg Hx    Past Surgical History:  Procedure Laterality Date   CESAREAN SECTION  2009   CESAREAN SECTION N/A 03/09/2017   Procedure: REPEAT CESAREAN SECTION;  Surgeon: Thurman Flores, MD;  Location: [facility] BIRTHING SUIT[facility];  Service: Obstetrics;  Laterality: N/A;   COLONOSCOPY  06/2012   Dr.Kaplan-MAC-movi(exc)-normal-10 yr recall   GYNECOLOGIC CRYOSURGERY     NASAL SEPTUM SURGERY  1983   TONSILLECTOMY  1966   Social History   Social History Narrative   Right handed   Drinks caffeine    Two story hom   Immunization History  Administered Date(s) Administered   Influenza Inj Mdck  Quad With Preservative 07/13/2018   Influenza Split 07/10/2013, 07/04/2014, 06/17/2015   Influenza,inj,Quad PF,6+ Mos 09/28/2022   Influenza-Unspecified 08/01/2021   PFIZER(Purple Top)SARS-COV-2 Vaccination 12/07/2019, 12/22/2019, 03/30/2021, 11/20/2023   PNEUMOCOCCAL CONJUGATE-20 11/28/2023   PPD Test 01/14/2014, 01/16/2015   Pneumococcal Polysaccharide-23 12/31/2011   Tdap 12/28/2010, 09/13/2016     Objective: Vital Signs: LMP 03/12/2012    Physical Exam Vitals and nursing note reviewed.  Constitutional:      Appearance: She is well-developed.  HENT:     Head: Normocephalic and atraumatic.  Eyes:     Conjunctiva/sclera: Conjunctivae normal.  Cardiovascular:     Rate and Rhythm: Normal rate and regular rhythm.     Heart sounds: Normal heart sounds.  Pulmonary:     Effort: Pulmonary effort is normal.     Breath sounds: Normal breath sounds.  Abdominal:     General: Bowel sounds are normal.     Palpations: Abdomen is soft.  Musculoskeletal:     Cervical back: Normal range of motion.  Lymphadenopathy:     Cervical: No cervical adenopathy.  Skin:    General: Skin is warm and dry.     Capillary Refill: Capillary refill takes less than 2 seconds.  Neurological:     Mental Status: She is alert and oriented to person, place, and time.  Psychiatric:        Behavior: Behavior normal.      Musculoskeletal Exam: ***  CDAI Exam: CDAI Score: -- Patient Global: --; Provider Global: -- Swollen: --; Tender: -- Joint Exam 01/11/2024   No joint exam has been documented for this visit   There is currently no information documented on the homunculus. Go to the Rheumatology activity and complete the homunculus joint exam.  Investigation: No additional findings.  Imaging: CT ABDOMEN PELVIS W CONTRAST Result Date: 01/03/2024 CLINICAL DATA:  Abdominal pain EXAM: CT ABDOMEN AND PELVIS WITH CONTRAST TECHNIQUE: Multidetector CT imaging of the abdomen and pelvis was performed using  the standard protocol following bolus administration of intravenous contrast. RADIATION DOSE REDUCTION: This exam was performed according to the departmental dose-optimization program which includes automated exposure control, adjustment of the mA and/or kV according to patient size and/or use of iterative reconstruction technique. CONTRAST:  OMNIPAQUE  IOHEXOL  300 MG/ML  SOLN COMPARISON:  CT abdomen and pelvis October 23, 2023 FINDINGS: Lower chest: No infiltrates or consolidations, no pleural effusions Hepatobiliary: Liver normal size no masses no biliary dilatation. Gallbladder unremarkable. No gallstones. Pancreas: Pancreas normal size. No masses calcifications or inflammatory changes. Spleen: Spleen normal size.  No masses. Adrenals/Urinary Tract: Adrenal glands are normal size. Follow-up recommended. Kidneys are normal. No masses calcifications or hydronephrosis Following the intravenous administration of contrast material there is prompt bilateral symmetric nephrograms with symmetric excretion of contrast in the pelvicalyceal system and ureters. There  is no persistent intraluminal filling defects or evidence of papillary necrosis. Both ureters are normal size and trajectory. The bladder appears grossly unremarkable without evidence of masses. Please note the delayed images did not include bladder or pelvis. Stomach/Bowel: No small or large bowel obstruction or inflammatory changes. Moderate amount of residual fecal material throughout the colon without obstruction or constipation. Vascular/Lymphatic: No significant vascular findings are present. No enlarged abdominal or pelvic lymph nodes. Reproductive: .  No masses. Bladder unremarkable. Other: Anterior abdominal wall unremarkable without evidence of umbilical or inguinal hernias Musculoskeletal: Visualized portion of the thoracolumbar spine and pelvic structures grossly unremarkable without evidence of fracture bony abnormalities or soft tissue masses.  IMPRESSION: *No acute intra-abdominal or pelvic pathology. *Moderate amount of residual fecal material throughout the colon without obstruction or constipation. Previously described inflammatory changes of the cecum and no longer present. Electronically Signed   By: Fredrich Jefferson M.D.   On: 01/03/2024 11:59    Recent Labs: Lab Results  Component Value Date   WBC 6.5 01/04/2024   HGB 13.0 01/04/2024   PLT 409 (H) 01/04/2024   NA 139 12/22/2023   K 4.2 12/22/2023   CL 102 12/22/2023   CO2 31 12/22/2023   GLUCOSE 90 12/22/2023   BUN 15 12/22/2023   CREATININE 0.81 12/22/2023   BILITOT 0.5 01/04/2024   ALKPHOS 53 12/22/2023   AST 12 01/04/2024   ALT 9 01/04/2024   PROT 6.4 01/04/2024   ALBUMIN 4.5 12/22/2023   CALCIUM  9.4 12/22/2023   GFRAA 105 09/09/2020    Speciality Comments: No specialty comments available.  Procedures:  No procedures performed Allergies: Patient has no known allergies.   Assessment / Plan:     Visit Diagnoses: Primary osteoarthritis of both hands - RF-,XR 11/09/22: consistent with OA. Referred to pain management-not a good candidate for narcotics. Meloxicam  not effective. CRP WNL 12/22/23.  Chronic pain of both knees - XR unremarkable on 11/09/22.  Primary osteoarthritis of both feet - XR 11/09/22: consistent with OA.  DDD (degenerative disc disease), cervical  Postural kyphosis of thoracic region  Spondylosis of lumbar spine  Trochanteric bursitis of both hips  Positive ANA (antinuclear antibody)  Vitamin D  deficiency  Labile hypertension  Aortic atherosclerosis (HCC) - per CT 01/30/2020  Mixed hyperlipidemia  History of gastroesophageal reflux (GERD)  Bipolar disorder, in partial remission, most recent episode depressed (HCC)  Other migraine without status migrainosus, not intractable  Family history of Alzheimer's disease  Orders: No orders of the defined types were placed in this encounter.  No orders of the defined types were  placed in this encounter.   Face-to-face time spent with patient was *** minutes. Greater than 50% of time was spent in counseling and coordination of care.  Follow-Up Instructions: No follow-ups on file.   Romayne Clubs, PA-C  Note - This record has been created using Dragon software.  Chart creation errors have been sought, but may not always  have been located. Such creation errors do not reflect on  the standard of medical care.

## 2024-01-10 NOTE — Telephone Encounter (Signed)
 Returned call to patient. Advised patient we do not prescribe pain medication. Patient advised we have not seen her in over a year. Patient advised she will need to schedule a follow up visit. Patient scheduled for 01/11/2024 at 7:50 am.

## 2024-01-11 ENCOUNTER — Ambulatory Visit: Attending: Physician Assistant | Admitting: Physician Assistant

## 2024-01-11 ENCOUNTER — Ambulatory Visit

## 2024-01-11 ENCOUNTER — Other Ambulatory Visit: Payer: Self-pay | Admitting: *Deleted

## 2024-01-11 ENCOUNTER — Telehealth: Payer: Self-pay | Admitting: *Deleted

## 2024-01-11 ENCOUNTER — Encounter: Payer: Self-pay | Admitting: Physician Assistant

## 2024-01-11 ENCOUNTER — Encounter: Payer: Self-pay | Admitting: *Deleted

## 2024-01-11 VITALS — BP 98/63 | HR 69 | Resp 15 | Ht 66.0 in | Wt 143.0 lb

## 2024-01-11 DIAGNOSIS — R1084 Generalized abdominal pain: Secondary | ICD-10-CM

## 2024-01-11 DIAGNOSIS — G8929 Other chronic pain: Secondary | ICD-10-CM

## 2024-01-11 DIAGNOSIS — E782 Mixed hyperlipidemia: Secondary | ICD-10-CM

## 2024-01-11 DIAGNOSIS — R7689 Other specified abnormal immunological findings in serum: Secondary | ICD-10-CM

## 2024-01-11 DIAGNOSIS — G43809 Other migraine, not intractable, without status migrainosus: Secondary | ICD-10-CM

## 2024-01-11 DIAGNOSIS — M25561 Pain in right knee: Secondary | ICD-10-CM | POA: Diagnosis not present

## 2024-01-11 DIAGNOSIS — M19072 Primary osteoarthritis, left ankle and foot: Secondary | ICD-10-CM

## 2024-01-11 DIAGNOSIS — M7061 Trochanteric bursitis, right hip: Secondary | ICD-10-CM

## 2024-01-11 DIAGNOSIS — Z820 Family history of epilepsy and other diseases of the nervous system: Secondary | ICD-10-CM

## 2024-01-11 DIAGNOSIS — M47816 Spondylosis without myelopathy or radiculopathy, lumbar region: Secondary | ICD-10-CM

## 2024-01-11 DIAGNOSIS — Z8719 Personal history of other diseases of the digestive system: Secondary | ICD-10-CM

## 2024-01-11 DIAGNOSIS — M5442 Lumbago with sciatica, left side: Secondary | ICD-10-CM

## 2024-01-11 DIAGNOSIS — M19071 Primary osteoarthritis, right ankle and foot: Secondary | ICD-10-CM

## 2024-01-11 DIAGNOSIS — M5441 Lumbago with sciatica, right side: Secondary | ICD-10-CM

## 2024-01-11 DIAGNOSIS — M25562 Pain in left knee: Secondary | ICD-10-CM

## 2024-01-11 DIAGNOSIS — M19041 Primary osteoarthritis, right hand: Secondary | ICD-10-CM | POA: Diagnosis not present

## 2024-01-11 DIAGNOSIS — M79641 Pain in right hand: Secondary | ICD-10-CM

## 2024-01-11 DIAGNOSIS — R0989 Other specified symptoms and signs involving the circulatory and respiratory systems: Secondary | ICD-10-CM

## 2024-01-11 DIAGNOSIS — R197 Diarrhea, unspecified: Secondary | ICD-10-CM

## 2024-01-11 DIAGNOSIS — M19042 Primary osteoarthritis, left hand: Secondary | ICD-10-CM

## 2024-01-11 DIAGNOSIS — M7062 Trochanteric bursitis, left hip: Secondary | ICD-10-CM

## 2024-01-11 DIAGNOSIS — M51369 Other intervertebral disc degeneration, lumbar region without mention of lumbar back pain or lower extremity pain: Secondary | ICD-10-CM

## 2024-01-11 DIAGNOSIS — R195 Other fecal abnormalities: Secondary | ICD-10-CM

## 2024-01-11 DIAGNOSIS — F3175 Bipolar disorder, in partial remission, most recent episode depressed: Secondary | ICD-10-CM

## 2024-01-11 DIAGNOSIS — M5030 Other cervical disc degeneration, unspecified cervical region: Secondary | ICD-10-CM

## 2024-01-11 DIAGNOSIS — M79642 Pain in left hand: Secondary | ICD-10-CM

## 2024-01-11 DIAGNOSIS — I700 Atherosclerosis of aorta: Secondary | ICD-10-CM

## 2024-01-11 DIAGNOSIS — E559 Vitamin D deficiency, unspecified: Secondary | ICD-10-CM

## 2024-01-11 DIAGNOSIS — R768 Other specified abnormal immunological findings in serum: Secondary | ICD-10-CM

## 2024-01-11 DIAGNOSIS — M4004 Postural kyphosis, thoracic region: Secondary | ICD-10-CM

## 2024-01-11 MED ORDER — NA SULFATE-K SULFATE-MG SULF 17.5-3.13-1.6 GM/177ML PO SOLN: ORAL | 0 refills | Status: DC

## 2024-01-11 NOTE — Telephone Encounter (Signed)
-----   Message from Romayne Clubs sent at 01/11/2024 10:32 AM EDT ----- X-rays of the lumbar spine were consistent with multilevel spondylosis and L4-L5 spondylolisthesis.  Please notify the patient.   Please offer referral to PT and a spine specialist.

## 2024-01-11 NOTE — Telephone Encounter (Signed)
 Spoke with patient moments ago to advise that Dr Bridgett Camps recommends colonoscopy following her 2 borderline elevated fecal calprotectin tests and continuing diarrhea/abdominal pain.   Patient did schedule colonsocopoy (1st available 02/15/24). However she wants Marlin Simmonds to know that she has also been using the recently prescribed hyosycamine 1 tablet very 6-8 hours and is has also been ineffective in treating her intense abdominal pain. States that she previously had oxycodone  from ER and hydrocodone  both which did help. Patient says she is not asking for either of those medications as she knows we do not prescribe these, but just wants to make us  aware of what things have worked for her thus far.

## 2024-01-11 NOTE — Progress Notes (Signed)
 X-rays of the pelvis revealed findings consistent with osteoarthritis of both SI joints.

## 2024-01-11 NOTE — Progress Notes (Signed)
 X-rays of the lumbar spine were consistent with multilevel spondylosis and L4-L5 spondylolisthesis.  Please notify the patient.   Please offer referral to PT and a spine specialist.

## 2024-01-12 ENCOUNTER — Telehealth: Payer: Self-pay | Admitting: Nurse Practitioner

## 2024-01-12 ENCOUNTER — Encounter: Admitting: Family Medicine

## 2024-01-12 NOTE — Telephone Encounter (Signed)
 Patient called and stated that she would like to speak to [facility] regarding a couple of question she had. Patient did not want to give anymore information. Patient is requesting a call back. Please advise.

## 2024-01-12 NOTE — Telephone Encounter (Signed)
 Spoke to patient who had questions about follow up timing. We discussed that this will be dependent on colonoscopy findings. Patient requests to be added to the wait list for sooner appointment than 02/15/24 if possible. Patient also inquires again about a different medication to help her abdominal pain as antispasmotics have been ineffective for her.

## 2024-01-12 NOTE — Telephone Encounter (Signed)
 Patient calls back today stating that her abdominal pain is very intense and causing her to "double over" at times. States the antispasmotics prescribed did not work. Says she took 2 hyoscyamine  last night that did not help the pain. She requests "something on the stronger end" to help her pain. Patient is scheduled for 02/15/24 colonoscopy and did ask to be added to the wait list so she can come sooner if possible.

## 2024-01-12 NOTE — Telephone Encounter (Signed)
 I called Tilford Foley, she continues to have generalized abdominal pain from "below rib cage down to groin area". No diarrhea. She is passing 3 to 4 brown stools daily. No severe abdominal pain at this time. Follow up CT was negative for colitis.   In review of her Epic records, she was seen by neurology Dr. Dotty Gee 01/05/2024 d/t having headaches, dizziness and balance issues with falls. She has a brain MRI scheduled 01/16/2024. Started on Gabapentin  Q HS.   I advised the patient not to take any further Hyoscyamine  and recommended Ibgard one po tid otc. I did not recommend any narcotic pain med. I instructed patient to go to the ED if she has severe pain. Patient also informed she will need to complete her neuro evaluation prior to proceeding with a colonoscopy which is scheduled 02/15/2024. Colonoscopy may need to be rescheduled to a later date.

## 2024-01-13 ENCOUNTER — Telehealth: Payer: Self-pay | Admitting: *Deleted

## 2024-01-13 NOTE — Telephone Encounter (Signed)
 Please refer her to pain management. Thank you.

## 2024-01-13 NOTE — Telephone Encounter (Signed)
 Patient contacted the office stating she saw Bonnie Hall on 01/11/2024. Patient states Bonnie Hall was supposed to talk to Bonnie Hall about a stronger anti-inflammatory medication for her. Patient states the Mobic  she was prescribed "is doing nothing". Patient states she is in pain all over her body and some days she has trouble walking.   Per Bonnie Hall's note: Unfortunately she is unable to take Tylenol  or NSAIDs due to an exacerbation of colitis symptoms. She was previously referred to pain management and was evaluated by Dr. Sharl Hall on 06/28/2023 at which time the use of narcotics was discouraged given concurrent use of several psychiatric medications. She was given a list of natural antiinflammatories including tumeric, tart cherry, ginger, and omega 3.   Explained to patient that with this noted in her chart it is not likely that Bonnie Hall will prescribe an NSAID for her. Patient states "she needs to prescribe something for this pain. I need something to take, I can hardly walk." Advised patient I would forward the message and that our office is closed and it may be Monday before she receives a response.

## 2024-01-15 LAB — MUTATED CITRULLINATED VIMENTIN (MCV) ANTIBODY: MUTATED CITRULLINATED VIMENTIN (MCV) AB: <20 U/mL (ref <20)

## 2024-01-15 LAB — RHEUMATOID FACTOR: Rheumatoid fact SerPl-aCnc: <10 [IU]/mL (ref <14)

## 2024-01-15 LAB — C-REACTIVE PROTEIN: CRP: <3 mg/L (ref <8.0)

## 2024-01-15 LAB — SEDIMENTATION RATE: Sed Rate: 2 mm/h (ref 0–30)

## 2024-01-15 LAB — CYCLIC CITRUL PEPTIDE ANTIBODY, IGG: Cyclic Citrullin Peptide Ab: <16 U

## 2024-01-15 NOTE — Progress Notes (Signed)
 Tests for rheumatoid arthritis including rheumatoid factor, anti-CCP and MCV antibodies were negative.  Sed rate normal and C-reactive protein normal which indicates no inflammation.

## 2024-01-15 NOTE — Progress Notes (Signed)
 Addendum: Reviewed and agree with assessment and management plan. Asha Grumbine, Carie Caddy, MD

## 2024-01-15 NOTE — Telephone Encounter (Signed)
 Agree, we can proceed to colonoscopy assuming no major neurologic changes in the patient's status or should the neurology evaluation change this decision.

## 2024-01-16 ENCOUNTER — Other Ambulatory Visit: Payer: Self-pay | Admitting: *Deleted

## 2024-01-16 DIAGNOSIS — M79642 Pain in left hand: Secondary | ICD-10-CM

## 2024-01-16 DIAGNOSIS — M542 Cervicalgia: Secondary | ICD-10-CM

## 2024-01-16 DIAGNOSIS — G8929 Other chronic pain: Secondary | ICD-10-CM

## 2024-01-16 DIAGNOSIS — M79671 Pain in right foot: Secondary | ICD-10-CM

## 2024-01-16 NOTE — Telephone Encounter (Signed)
 I called patient, patient verbalized understanding.

## 2024-01-17 ENCOUNTER — Telehealth: Payer: Self-pay | Admitting: Nurse Practitioner

## 2024-01-17 DIAGNOSIS — R195 Other fecal abnormalities: Secondary | ICD-10-CM

## 2024-01-17 DIAGNOSIS — R197 Diarrhea, unspecified: Secondary | ICD-10-CM

## 2024-01-17 DIAGNOSIS — K529 Noninfective gastroenteritis and colitis, unspecified: Secondary | ICD-10-CM

## 2024-01-17 DIAGNOSIS — R1084 Generalized abdominal pain: Secondary | ICD-10-CM

## 2024-01-17 NOTE — Telephone Encounter (Signed)
 Left message for patient to call back

## 2024-01-17 NOTE — Telephone Encounter (Signed)
 Patient called and asked to speak with you regarding colonoscopy.  She also wants to talk to you about adding an EGD.  Please call patient and advise.  Thank you.

## 2024-01-18 ENCOUNTER — Ambulatory Visit
Admission: RE | Admit: 2024-01-18 | Discharge: 2024-01-18 | Disposition: A | Discharge status: Home / Self Care | Source: Ambulatory Visit | Attending: Neurology

## 2024-01-18 DIAGNOSIS — R42 Dizziness and giddiness: Secondary | ICD-10-CM

## 2024-01-18 DIAGNOSIS — R55 Syncope and collapse: Secondary | ICD-10-CM

## 2024-01-18 NOTE — Telephone Encounter (Signed)
 Yes, I can add the egd that same day

## 2024-01-18 NOTE — Telephone Encounter (Signed)
 JMP-  Patient is currently on for colonoscopy 02/15/24. Next available double opening is 03/20/24. Can I squeeze endo and colon in 30 minute slot 6/4 or do I need to move her out to 7/8?   Currently, you are scheduled for afternoon EGD, COLON, EGD/COLON, COLON, EGD

## 2024-01-18 NOTE — Telephone Encounter (Signed)
 Returned patient's call. She was in the middle of a dental appointment. States she will call back at a later time.

## 2024-01-18 NOTE — Telephone Encounter (Signed)
 Spoke to patient who would like to know if she may have endoscopy added on to her already scheduled colonoscopy 02/15/24 (for persistent borderline elevated fecal calprotectin). It does look like there was mention at office visit on 12/22/23 of possible endoscopy if patient had persistent abdominal pain.  Please confirm if ok to add endoscopy procedure on.

## 2024-01-18 NOTE — Telephone Encounter (Signed)
 Patient returning call. Please call at [phone].  Thank you

## 2024-01-18 NOTE — Telephone Encounter (Signed)
 Bonnie Hall, ok to add EGD if she continues to have epigastric/upper abdominal pain.  Dr. Pink Bridges

## 2024-01-18 NOTE — Telephone Encounter (Signed)
 Patient advised that we will add endoscopy to her already scheduled colonoscopy on 02/15/24. She verbalizes understanding and expresses her thankfulness to Dr Bridgett Camps for working in the endoscopy.

## 2024-01-23 ENCOUNTER — Encounter: Payer: Self-pay | Admitting: Family

## 2024-01-23 DIAGNOSIS — Z419 Encounter for procedure for purposes other than remedying health state, unspecified: Secondary | ICD-10-CM | POA: Diagnosis not present

## 2024-01-24 ENCOUNTER — Telehealth: Payer: Self-pay | Admitting: Neurology

## 2024-01-24 ENCOUNTER — Ambulatory Visit: Payer: Self-pay

## 2024-01-24 MED ORDER — GABAPENTIN 300 MG PO CAPS: 300.0000 mg | ORAL_CAPSULE | Freq: Every day | ORAL | 0 refills | Status: DC

## 2024-01-24 NOTE — Progress Notes (Signed)
 Patient advised. Per patient she feels like the Gabapentin  not helping at all. Advised patient of Dr.Jaffe note from her last visit. For treatment of headache, start gabapentin  100mg  at bedtime. We can titrate to 300mg  at bedtime in 4 weeks if needed.  Please call in a week to let us  know how it is going.  Patient also wanted to let Dr.Jaffe that her neck bothering her for a while now since February. It is worse to move her head. Per patient she wants to know what to do.

## 2024-01-24 NOTE — Telephone Encounter (Signed)
 Patient advised of Dr.Jaffe note, We advised to take her blood pressure when she feels dizzy to see if it is low.  Otherwise, I would follow up with her PCP.  The dizziness didn't really seem neurologic.

## 2024-01-24 NOTE — Telephone Encounter (Signed)
 She can just go ahead and send a prescription over for gabapentin  300mg  capsule at bedtime.  I would first increase the 100mg  capsule to 2 pills at bedtime for a week, then start the 300mg  capsule.    The gabapentin  may help with that.  However, I can't really comment too much on the neck pain since that was not discussed.    I would probably address it with the physician who was working it up.  She had X-rays done last year which show arthritis.    Patient will discuss the neck pain with Rheumatology

## 2024-01-24 NOTE — Telephone Encounter (Signed)
 Pt called in stating she just spoke with [facility] and she forgot to tell her that she is still getting Dizzy. She will fall against the wall from it.

## 2024-01-25 NOTE — Telephone Encounter (Signed)
 Bonnie Hall,   See 01/05/24 telephone note. Patient has had neurology evaluation (also available in EPIC). Ok for her to proceed with scheduled endo/colon on 02/15/24 based on neurology evaluation?

## 2024-01-26 NOTE — Telephone Encounter (Signed)
 Excellent

## 2024-01-26 NOTE — Telephone Encounter (Signed)
  Bonnie Hall 1961/07/23 [id number]  01/26/24   Dear Dr Festus Hubert:  We have scheduled the above named patient for an endoscopy and colonoscopy procedure. Our records show that she has recently undergone neurologic evaluation for headaches, dizziness and syncope.  Please advise as to whether this patient is cleared from a neurology standpoint to move forward with endoscopy and colonoscopy using propofol sedation on 02/15/24 as scheduled.  Please route your response to Rudi Corwin, RN.   Sincerely,    [facility]

## 2024-01-26 NOTE — Telephone Encounter (Signed)
 Bonnie Hall, refer to phone note 5/4. Thank you for keeping track of her neuro eval. She had a brain MRI 5/7 which showed  mild white matter changes primarily in the frontal lobes for which differential would include early chronic microvascular ischemia, chronic migraines or vasculitis and old microhemorrhage in the right parietal lobe. Brain MRA pending. Please send an official neurology clearance to Dr. Jaffee which is required prior to the patient proceeding with an EGD/colonoscopy on 02/15/2024. THX.

## 2024-01-26 NOTE — Telephone Encounter (Signed)
===  View-only below this line=== ----- Message ----- From: Merriam Abbey, DO Sent: 01/26/2024   8:19 AM EDT To: Glennette Lanius, RN  From a neurologic standpoint, she is cleared

## 2024-01-26 NOTE — Telephone Encounter (Signed)
 Bonnie Hall-  Just an FYI. Patient has been cleared by neurology to move forward with procedure as scheduled.

## 2024-01-31 ENCOUNTER — Ambulatory Visit (INDEPENDENT_AMBULATORY_CARE_PROVIDER_SITE_OTHER): Admitting: Family

## 2024-01-31 ENCOUNTER — Other Ambulatory Visit: Payer: Self-pay | Admitting: Family

## 2024-01-31 ENCOUNTER — Ambulatory Visit: Payer: Self-pay | Admitting: Family

## 2024-01-31 ENCOUNTER — Encounter: Payer: Self-pay | Admitting: Family

## 2024-01-31 VITALS — BP 132/78 | HR 68 | Temp 96.2°F | Ht 66.0 in | Wt 143.2 lb

## 2024-01-31 DIAGNOSIS — R2681 Unsteadiness on feet: Secondary | ICD-10-CM

## 2024-01-31 DIAGNOSIS — R413 Other amnesia: Secondary | ICD-10-CM

## 2024-01-31 DIAGNOSIS — K529 Noninfective gastroenteritis and colitis, unspecified: Secondary | ICD-10-CM

## 2024-01-31 DIAGNOSIS — R519 Headache, unspecified: Secondary | ICD-10-CM

## 2024-01-31 DIAGNOSIS — R1084 Generalized abdominal pain: Secondary | ICD-10-CM | POA: Diagnosis not present

## 2024-01-31 DIAGNOSIS — M5416 Radiculopathy, lumbar region: Secondary | ICD-10-CM

## 2024-01-31 DIAGNOSIS — M542 Cervicalgia: Secondary | ICD-10-CM | POA: Diagnosis not present

## 2024-01-31 MED ORDER — ONDANSETRON 4 MG PO TBDP: 4.0000 mg | ORAL_TABLET | Freq: Three times a day (TID) | ORAL | 3 refills | Status: AC | PRN

## 2024-01-31 NOTE — Telephone Encounter (Signed)
 Chief Complaint: Chronic neck pain  Symptoms: Severe pain in stomach every day, headaches, lightheadedness  Disposition: [x] Appointment (In office)  Additional Notes: Patient states she saw a neurologist for her headaches. Pt had an MRI and states nothing was determined. Pt states she was put on gabapentin  which she states has "so far not worked for headaches." Pt then went to see GI who did a scan of her stomach. Pt states she still is having "severe pain in stomach" and follows this with "it is not bad enough to go to the hospital so don't even throw that out there." Pt states she was seen in the hospital in February in which she was diagnosed with colitis. Pt states she is scheduled for a colonoscopy. Pt also went to see a spine doctor who has her scheduled for an MRI of her back. Pt states her back pain is "wrapping around the sides." Pt states her rheumatologist stated they do not give pain medicine. Pt states she needs pain medicine as needed. Pt scheduled for an appointment today with PCP in office.    Copied from CRM [id number]. Topic: Clinical - Red Word Triage >> Jan 31, 2024 11:23 AM Bonnie Hall wrote: Red Word that prompted transfer to Nurse Triage: Patient Neck pain since 10/2023-Headaches/fatigue/ colitis diagnosis/Sever stomach pain Reason for Disposition  [1] SEVERE neck pain (e.g., excruciating, unable to do any normal activities) AND [2] not improved after 2 hours of pain medicine  Answer Assessment - Initial Assessment Questions Neck pain constant since February when turning from side to side or bending it, 8-9/10 pain level No pain when holding neck still Not sure what causing the neck pain Stomach pain when bending over that "takes breath away;" varies 6-7/10 intermittent pain level Back pain that "takes breath away"  Protocols used: Neck Pain or Stiffness-A-AH

## 2024-01-31 NOTE — Telephone Encounter (Signed)
 Noted

## 2024-02-02 ENCOUNTER — Telehealth: Payer: Self-pay | Admitting: *Deleted

## 2024-02-02 DIAGNOSIS — M542 Cervicalgia: Secondary | ICD-10-CM

## 2024-02-02 NOTE — Telephone Encounter (Signed)
 Will send order as requested to [facility] then imaging center will call you to schedule appointment.

## 2024-02-02 NOTE — Telephone Encounter (Signed)
 Please verify MRI for ? Patient recently had MRI of the Brain order by DR.Adam.

## 2024-02-02 NOTE — Telephone Encounter (Signed)
 Copied from CRM [id number]. Topic: General - Other >> Jarrick Fjeld 22, 2025  3:33 PM Shelby Dessert H wrote: Reason for CRM: Patient is requesting a callback to see if provider will send her for a MRI. Patients callback number is [phone].  Patient was seen on 01/31/2024

## 2024-02-02 NOTE — Telephone Encounter (Signed)
 Patient would like a MRI of her Neck due to pain and loss of range of motion. Wants to go to [facility].

## 2024-02-03 NOTE — Telephone Encounter (Signed)
 Patient Aware -

## 2024-02-06 NOTE — Progress Notes (Signed)
 Provider: Christean Courts FNP-C  Allahna Husband, Elijio Guadeloupe, NP  Patient Care Team: Dorann Davidson, Elijio Guadeloupe, NP as PCP - General (Family Medicine) Thurman Flores, MD as Attending Physician (Obstetrics and Gynecology) Bucky Cardinal, NP as Nurse Practitioner (Psychiatry)  Extended Emergency Contact Information Primary Emergency Contact: Klostermann,Richard Address: [address]          [place], [place] [address] [place] Work Phone: [phone] Mobile Phone: [phone] Relation: Spouse  Code Status:  Full code Goals of care: Advanced Directive information    01/05/2024    9:04 AM  Advanced Directives  Does Patient Have a Medical Advance Directive? No     Chief Complaint  Patient presents with   Neck Pain    Notes from Triage nurse: Pt also went to see a spine doctor who has her scheduled for an MRI of her back. Pt states her back pain is "wrapping around the sides." Pt states her rheumatologist stated they do not give pain medicine. Pt states she needs pain medicine as needed. Pt scheduled for an appointment today with PCP in office. Pt states also having stomach pain is seeing GI Pt having headaches also pain level 5 is seeing Neurologist    Discussed the use of AI scribe software for clinical note transcription with the patient, who gave verbal consent to proceed.  History of Present Illness   ESBEIDY MCLAINE is a 63 year old female who presents with persistent abdominal pain and headaches.  She experiences persistent abdominal pain that is severe, sometimes taking her breath away, and radiates from under her breasts to the groin area on both sides. Frequent burping and flatulence accompany the pain. A CT scan on April 18 showed a moderate amount of fecal matter throughout the colon. She is scheduled for a colonoscopy on June 5. Hyoscyamine  and dicyclomine  have been prescribed for pain but are ineffective. An over-the-counter product, Ivigard, worsened her  symptoms. She has frequent bowel movements, approximately four to five times a day, and experiences pain when bending over or walking.  She experiences daily headaches that are not typical migraines. An MRI of the brain was performed, and she was prescribed gabapentin , starting at 100 mg and increasing to 300 mg by Saturday, with no improvement in symptoms. No alcohol use.  She reports neck pain that worsens with movement, such as turning or tilting her head. The pain radiates around her neck and sometimes down her back. No imaging has been done for her neck, and previous specialists have not addressed this issue. No tenderness in the back but tenderness in the upper spine and abdomen.  She experiences episodes of lightheadedness and imbalance, leading to falls and running into walls. Blood pressure readings during these episodes have been normal. She describes the sensation as an equilibrium problem rather than dizziness.  Her current medications include Lamictal  300 mg daily, trazodone 200 mg at bedtime, alprazolam 1 mg at bedtime as needed, clonazepam 0.75 mg daily, erythromycin  ointment for the eye, lidocaine  patch, Trintellix 20 mg daily, Zofran  4 mg as needed, gabapentin  up to 300 mg, Protonix  twice daily, Pepcid  20 mg twice daily, montelukast , and Lipitor 10 mg daily. She no longer uses promethazine  syrup or sucralfate  solution.  She has three children at home and is actively involved in their care.   Past Medical History:  Diagnosis Date   Allergy    Hay fever/exercise induced asthma   AMA (advanced maternal age) multigravida 35+    Anxiety  on meds   Arthritis    generalized   Asthma    Blood transfusion 1990's   Colitis 10/23/2023   Depression    bipolar=on meds   GERD (gastroesophageal reflux disease)    on meds   Hyperlipidemia    on meds   Iron deficiency anemia 1990's   negative work up as to etiology; required iron infusion, blood transfusion.  S[facility] an hematologist  at  [facility].    Left-sided Bell's palsy 02/03/2021   Migraine    Newborn product of in vitro fertilization (IVF) pregnancy    Pregnancy induced hypertension    on meds   Preterm labor    Sleep apnea    no CPAP   Thrombocytosis    Vaginal Pap smear, abnormal    Past Surgical History:  Procedure Laterality Date   CESAREAN SECTION  2009   CESAREAN SECTION N/A 03/09/2017   Procedure: REPEAT CESAREAN SECTION;  Surgeon: Thurman Flores, MD;  Location: [facility] BIRTHING SU[facility];  Service: Obstetrics;  Laterality: N/A;   COLONOSCOPY  06/2012   Dr.Kaplan-MAC-movi(exc)-normal-10 yr recall   GYNECOLOGIC CRYOSURGERY     NASAL SEPTUM SURGERY  1983   TONSILLECTOMY  1966    No Known Allergies  Outpatient Encounter Medications as of 01/31/2024  Medication Sig   ALPRAZolam (XANAX) 1 MG tablet Take 1 mg by mouth at bedtime as needed.   atorvastatin  (LIPITOR) 10 MG tablet Take 1 tablet (10 mg total) by mouth daily.   benzonatate  (TESSALON  PERLES) 100 MG capsule Take 1 capsule (100 mg total) by mouth 3 (three) times daily as needed.   erythromycin  ophthalmic ointment Apply to eye.   famotidine  (PEPCID ) 20 MG tablet Take 1 tablet (20 mg total) by mouth 2 (two) times daily.   gabapentin  (NEURONTIN ) 300 MG capsule Take 1 capsule (300 mg total) by mouth at bedtime.   lamoTRIgine  (LAMICTAL ) 200 MG tablet Take 300 mg by mouth daily.   lidocaine  (LIDODERM ) 5 % Place 1 patch onto the skin daily. Remove & Discard patch within 12 hours or as directed by MD   montelukast  (SINGULAIR ) 10 MG tablet Take 1 tablet (10 mg total) by mouth at bedtime.   pantoprazole  (PROTONIX ) 40 MG tablet Take 1 tablet (40 mg total) by mouth 2 (two) times daily.   vortioxetine HBr (TRINTELLIX) 20 MG TABS tablet Take 20 mg by mouth daily.   [DISCONTINUED] ondansetron  (ZOFRAN -ODT) 4 MG disintegrating tablet DISSOLVE ONE TABLET ON TONGUE EVERY 8 HOURS AS NEEDED FOR NAUSEA OR VOMITING   clonazePAM (KLONOPIN) 1 MG tablet Take 0.75 mg by mouth daily.    ondansetron  (ZOFRAN -ODT) 4 MG disintegrating tablet Take 1 tablet (4 mg total) by mouth every 8 (eight) hours as needed for nausea or vomiting. DISSOLVE ONE TABLET ON TONGUE EVERY 8 HOURS AS NEEDED FOR NAUSEA OR VOMITING   traZODone (DESYREL) 100 MG tablet Take 200 mg by mouth at bedtime.   [DISCONTINUED] hyoscyamine  (LEVSIN SL) 0.125 MG SL tablet Dissolve 1 tablet under the tongue every 6-8 hours as needed for abdominal pain (Patient not taking: Reported on 01/31/2024)   [DISCONTINUED] Na Sulfate-K Sulfate-Mg Sulfate concentrate (SUPREP) 17.5-3.13-1.6 GM/177ML SOLN Use as directed; may use generic; goodrx card if insurance will not cover generic (Patient not taking: Reported on 01/31/2024)   [DISCONTINUED] promethazine -phenylephrine  (PROMETHAZINE  VC) 6.25-5 MG/5ML SYRP Take 5 mLs by mouth every 4 (four) hours as needed for congestion. (Patient not taking: Reported on 01/31/2024)   Facility-Administered Encounter Medications as of 01/31/2024  Medication  0.9 %  sodium chloride  infusion    Review of Systems  Constitutional:  Negative for appetite change, chills, fatigue, fever and unexpected weight change.  HENT:  Negative for congestion, dental problem, ear discharge, ear pain, facial swelling, hearing loss, nosebleeds, postnasal drip, rhinorrhea, sinus pressure, sinus pain, sneezing, sore throat, tinnitus and trouble swallowing.   Eyes:  Negative for pain, discharge, redness, itching and visual disturbance.  Respiratory:  Negative for cough, chest tightness, shortness of breath and wheezing.   Cardiovascular:  Negative for chest pain, palpitations and leg swelling.  Gastrointestinal:  Positive for abdominal pain. Negative for abdominal distention, blood in stool, constipation, diarrhea, nausea and vomiting.       Burping,bloated and Flatulence   Endocrine: Negative for cold intolerance.  Genitourinary:  Negative for difficulty urinating, dysuria, flank pain, frequency and urgency.   Musculoskeletal:  Positive for arthralgias, back pain and neck pain. Negative for gait problem, joint swelling, myalgias and neck stiffness.  Skin:  Negative for color change, pallor, rash and wound.  Neurological:  Negative for dizziness, syncope, speech difficulty, weakness, light-headedness and numbness.       Chronic headche  Hematological:  Does not bruise/bleed easily.  Psychiatric/Behavioral:  Negative for agitation, behavioral problems, confusion, hallucinations, self-injury, sleep disturbance and suicidal ideas. The patient is not nervous/anxious.     Immunization History  Administered Date(s) Administered   Influenza Inj Mdck Quad With Preservative 07/13/2018   Influenza Split 07/10/2013, 07/04/2014, 06/17/2015   Influenza,inj,Quad PF,6+ Mos 09/28/2022   Influenza-Unspecified 08/01/2021   MMR 01/16/2024   PFIZER(Purple Top)SARS-COV-2 Vaccination 12/07/2019, 12/22/2019, 03/30/2021, 11/20/2023   PNEUMOCOCCAL CONJUGATE-20 11/28/2023   PPD Test 01/14/2014, 01/16/2015   Pneumococcal Polysaccharide-23 12/31/2011   Respiratory Syncytial Virus Vaccine,Recomb Aduvanted(Arexvy) 01/16/2024   Tdap 12/28/2010, 09/13/2016   Pertinent  Health Maintenance Due  Topic Date Due   INFLUENZA VACCINE  04/13/2024   MAMMOGRAM  10/15/2024   DEXA SCAN  11/18/2027   Colonoscopy  09/08/2029      04/14/2021    9:47 AM 11/09/2023    9:20 AM 11/28/2023    9:57 AM 01/05/2024    9:04 AM 01/31/2024    1:06 PM  Fall Risk  Falls in the past year? 0 0 0 0 0  Was there an injury with Fall? 0 0 0 0 0  Fall Risk Category Calculator 0 0 0 0 0  Fall Risk Category (Retired) Low      (RETIRED) Patient Fall Risk Level Low fall risk      Patient at Risk for Falls Due to  No Fall Risks No Fall Risks  No Fall Risks  Fall risk Follow up  Falls evaluation completed Falls evaluation completed Falls evaluation completed Falls evaluation completed   Functional Status Survey:    Vitals:   01/31/24 1306  BP: 132/78   Pulse: 68  Temp: (!) 96.2 F (35.7 C)  SpO2: 98%  Weight: 143 lb 3.2 oz (65 kg)  Height: 5\' 6"  (1.676 m)   Body mass index is 23.11 kg/m. Physical Exam GENERAL: Alert, cooperative, well developed, no acute distress HEENT: Normocephalic, normal oropharynx, moist mucous membranes CHEST: Clear to auscultation bilaterally, no wheezes, rhonchi, or crackles CARDIOVASCULAR: Normal heart rate and rhythm, S1 and S2 normal without murmurs ABDOMEN: Soft, tenderness in upper abdomen and under ribs, non-distended, without organomegaly, normal bowel sounds EXTREMITIES: No cyanosis or edema MUSCULOSKELETAL: Tenderness in upper spine NEUROLOGICAL: Cranial nerves grossly intact, moves all extremities without gross motor or sensory deficit  SKIN: No  rash,no lesion or erythema   PSYCHIATRY/BEHAVIORAL: Mood stable   Labs reviewed: Recent Labs    10/23/23 1715 11/29/23 0816 12/22/23 0951  NA 131* 138 139  K 3.5 4.8 4.2  CL 97* 101 102  CO2 25 30 31   GLUCOSE 108* 94 90  BUN 19 16 15   CREATININE 0.89 0.77 0.81  CALCIUM  9.1 9.4 9.4   Recent Labs    10/23/23 1715 11/29/23 0816 12/22/23 0951 01/04/24 0811  AST 14* 11 11 12   ALT 13 8 8 9   ALKPHOS 46  --  53  --   BILITOT 0.7 0.6 0.5 0.5  PROT 7.0 6.7 6.8 6.4  ALBUMIN 4.0  --  4.5  --    Recent Labs    11/29/23 0816 12/22/23 0951 01/04/24 0811  WBC 7.8 5.2 6.5  NEUTROABS 5,780 3.2 4,414  HGB 13.2 13.2 13.0  HCT 40.0 40.0 40.1  MCV 95.7 96.9 95.9  PLT 431* 389.0 409*   Lab Results  Component Value Date   TSH 0.88 11/29/2023   Lab Results  Component Value Date   HGBA1C 5.3 03/29/2023   Lab Results  Component Value Date   CHOL 160 01/04/2024   HDL 64 01/04/2024   LDLCALC 80 01/04/2024   TRIG 82 01/04/2024   CHOLHDL 2.5 01/04/2024    Significant Diagnostic Results in last 30 days:  MR BRAIN WO CONTRAST Result Date: 01/23/2024 EXAMINATION: MR BRAIN WO CONTRAST HISTORY: Headache, new onset (Age >= 51y); Dizzniess,  Syncope TECHNIQUE: MRI of the brain performed without IV contrast. COMPARISON: 01/28/2021 FINDINGS: No evidence for intracranial mass, hemorrhage, or acute infarct. There are a few nonspecific bilateral subcortical white matter T2 hyperintensities predominantly in the frontal lobes. The ventricles are symmetric and the basilar cisterns appear patent. Major intracranial flow voids are preserved. The paranasal sinuses and mastoid air cells are well aerated. The orbits appear normal. There is an old microhemorrhage in the right parietal lobe. IMPRESSION: Mild white matter changes primarily in the frontal lobes for which differential would include early chronic microvascular ischemia, chronic migraines or vasculitis. Old microhemorrhage in the right parietal lobe. Electronically signed by: Italy Engel MD 01/23/2024 07:02 AM EDT RP Workstation: [id number]   XR Pelvis 1-2 Views Result Date: 01/11/2024 Mild SI joint sclerosis was noted.  No significant joint space narrowing was noted.  No erosive changes were noted. 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  XR Lumbar Spine 2-3 Views Result Date: 01/11/2024 Dextroscoliosis was noted.  Multilevel spondylosis with L4-L5 spondylolisthesis was noted.  Facet joint arthropathy was noted. Impression: These findings suggestive of multilevel spondylosis, and L4-L5 spondylolisthesis   Assessment/Plan  Abdominal pain Persistent abdominal pain with episodes severe enough to take breath away, extending from under the breast to the groin, associated with burping and flatulence. Previous imaging showed moderate fecal matter in colon, no obstruction or acute pathology. GI specialist suspects constipation, but she disagrees. Previous treatments including hyoscyamine , dicyclomine , and Ivigard were ineffective or worsened symptoms. Scheduled for colonoscopy and endoscopy on June 4th. - Proceed with scheduled colonoscopy and endoscopy on June  4th. - Start probiotic, such as Florastor, to help with bloating. - Use Zofran  4 mg disintegrating tablet as needed for nausea, at least 30 minutes before meals. - Refer to pain management specialist for evaluation and management of abdominal pain.  Chronic low back pain with radiculopathy Chronic low back pain with radicular symptoms, primarily on the right side, radiating to hip and leg,  exacerbated by movement. MRI of lower back pending. Pain management referral requested due to inadequate relief from current medications and need for further evaluation. - Proceed with scheduled MRI of lower back. - Contact [facility] to add neck MRI to existing lower back MRI order. - Refer to pain management specialist for evaluation and management of low back pain.  Neck pain Chronic neck pain exacerbated by movement, radiating to shoulders. No imaging done yet. Suspected to be related to spine issues. Pain management referral requested due to inadequate relief from current medications and need for further evaluation. - Contact  [facility] to add neck MRI to existing lower back MRI order. - Refer to pain management specialist for evaluation and management of neck pain.  Chronic headaches Chronic frontal headaches, not typical migraines, persistent daily. Gabapentin  prescribed by neurologist, currently titrating dose to 300 mg. No significant relief reported yet. - Continue gabapentin , increase to 300 mg as planned. - Follow up with neurologist for further evaluation and management.  Unsteady gait  Episodes of lightheadedness and imbalance, leading to falls. Neurologist attributed to low blood pressure, but she reports normal readings during episodes. Described as an equilibrium problem rather than dizziness. - Follow up with neurologist for further evaluation of equilibrium disturbance.  Memory loss  MRI of brain showed microvascular ischemia, described as 'old  person' changes. No specific symptoms attributed to this finding.   Family/ staff Communication: Reviewed plan of care with patient verbalized understanding  Labs/tests ordered: None   Next Appointment: Return if symptoms worsen or fail to improve.   Total time: minutes. Greater than 50% of total time spent doing patient education regarding functional Abdominal pain,memory issues,unsteady Gait,chronic headaches,Neck pain ,chronic back pain,health maintenance including symptom/medication management.   Estil Heman, NP

## 2024-02-07 ENCOUNTER — Telehealth: Payer: Self-pay

## 2024-02-07 NOTE — Telephone Encounter (Signed)
 Copied from CRM [id number]. Topic: Referral - Status >> Feb 07, 2024  9:45 AM Shelby Dessert H wrote: Reason for CRM: Rachael W from [facility] called and stated that the patient was denied due to not being in network and she can't be seen in there office.

## 2024-02-07 NOTE — Telephone Encounter (Signed)
 Message routed to [facility].B referral coordinator. n

## 2024-02-09 ENCOUNTER — Other Ambulatory Visit: Payer: Self-pay | Admitting: *Deleted

## 2024-02-09 ENCOUNTER — Telehealth: Payer: Self-pay | Admitting: *Deleted

## 2024-02-09 DIAGNOSIS — R051 Acute cough: Secondary | ICD-10-CM

## 2024-02-09 MED ORDER — BENZONATATE 100 MG PO CAPS
100.0000 mg | ORAL_CAPSULE | Freq: Three times a day (TID) | ORAL | 0 refills | Status: DC | PRN
Start: 2024-02-09 — End: 2024-04-26

## 2024-02-09 NOTE — Telephone Encounter (Signed)
 [facility] [phone] option 1 and option 3 , is calling stated that they received a referral for Neck imaging. They wanting to know what the provider is wanting:  An Angiogram or Cervical Spine For Neck Pain.   Please Advise.

## 2024-02-09 NOTE — Telephone Encounter (Signed)
 Refill Requested from pharmacy Pended Rx and sent to [facility] for approval.

## 2024-02-09 NOTE — Telephone Encounter (Signed)
 Patient had an MRI of the lumbar spine ordered by Neurosurgery but also wanted MRI for the cervical spine.patient was advised to get in contact with [facility] to add MRI of the cervical spine if indicated.

## 2024-02-10 ENCOUNTER — Telehealth: Payer: Self-pay | Admitting: Nurse Practitioner

## 2024-02-10 ENCOUNTER — Telehealth: Payer: Self-pay | Admitting: Sports Medicine

## 2024-02-10 NOTE — Telephone Encounter (Signed)
 Forwarded to Clinical Intake, Jasmine

## 2024-02-10 NOTE — Telephone Encounter (Signed)
 Yes.That's same order.

## 2024-02-10 NOTE — Telephone Encounter (Signed)
 Inbound call from patient requesting to speak with Dottie regarding a few questions she has. Patient did not wish to disclose further information. Please advise, thank you.

## 2024-02-10 NOTE — Telephone Encounter (Signed)
 Copied from CRM [id number]. Topic: Referral - Question >> Feb 10, 2024  1:16 PM Lenon Radar A wrote: Reason for CRM: Patient called in regarding request from Dr. Nedra Ball needed to be sent to [facility] to be sent over. Please contact patient with any questions at [phone].

## 2024-02-10 NOTE — Telephone Encounter (Signed)
 Patient calls to state that she has constant abdominal pain and has previously been told to go to the emergency room for severe pain. Patient spends large amount of time in conversation stating that her pain does not warrant going to the ER and spending 7-8 hours waiting only to be bumped back even further when another emergent case comes in. She states that she wants to know why [facility] will not prescribe pain medications for her. I explained that we do not provide pain medication but have recently sent antispasmotics for pain which she says did not help her.   Patient is advised that we will need to complete endoscopy./colonoscopy on 02/15/24 with direct visualization of GI tract to make additional recommendations.

## 2024-02-10 NOTE — Telephone Encounter (Signed)
 I called patient and she states that you have to place the order for it to be done. She states that they will not do this order unless it's placed by PCP.  Patient has "MR ANGIO NECK W WO CONTRAST" schedule already. Is this the same thing? Message routed back to PCP Ngetich, Elijio Guadeloupe, NP

## 2024-02-11 ENCOUNTER — Other Ambulatory Visit: Payer: Self-pay | Admitting: Neurology

## 2024-02-13 ENCOUNTER — Telehealth: Payer: Self-pay | Admitting: Family

## 2024-02-13 NOTE — Telephone Encounter (Signed)
 Carla please review...  Copied from CRM [id number]. Topic: Referral - Question >> Feb 13, 2024  8:17 AM Lenon Radar A wrote: Reason for CRM: Patient called in regarding referral that was sent in for pain management to [facility] does not accept her insurance and she prefers not to go there. Patient does not want to be referred to [facility], [facility] or [facility] center. Please place new referral for patient that her insurance accepts. If any questions please contact patient at [phone].

## 2024-02-13 NOTE — Telephone Encounter (Signed)
 Ok, thank you for clarifying. I have let patient know.

## 2024-02-15 ENCOUNTER — Ambulatory Visit (AMBULATORY_SURGERY_CENTER): Admitting: Internal Medicine

## 2024-02-15 ENCOUNTER — Encounter: Payer: Self-pay | Admitting: Internal Medicine

## 2024-02-15 ENCOUNTER — Other Ambulatory Visit: Payer: Self-pay | Admitting: Family

## 2024-02-15 VITALS — BP 97/72 | HR 59 | Temp 98.3°F | Resp 13 | Ht 66.0 in | Wt 139.0 lb

## 2024-02-15 DIAGNOSIS — R197 Diarrhea, unspecified: Secondary | ICD-10-CM

## 2024-02-15 DIAGNOSIS — D120 Benign neoplasm of cecum: Secondary | ICD-10-CM

## 2024-02-15 DIAGNOSIS — R933 Abnormal findings on diagnostic imaging of other parts of digestive tract: Secondary | ICD-10-CM

## 2024-02-15 DIAGNOSIS — D125 Benign neoplasm of sigmoid colon: Secondary | ICD-10-CM

## 2024-02-15 DIAGNOSIS — K2950 Unspecified chronic gastritis without bleeding: Secondary | ICD-10-CM

## 2024-02-15 DIAGNOSIS — M542 Cervicalgia: Secondary | ICD-10-CM

## 2024-02-15 DIAGNOSIS — K648 Other hemorrhoids: Secondary | ICD-10-CM

## 2024-02-15 DIAGNOSIS — R1030 Lower abdominal pain, unspecified: Secondary | ICD-10-CM

## 2024-02-15 DIAGNOSIS — K219 Gastro-esophageal reflux disease without esophagitis: Secondary | ICD-10-CM

## 2024-02-15 MED ORDER — FLUCONAZOLE 100 MG PO TABS: ORAL_TABLET | ORAL | 0 refills | Status: DC

## 2024-02-15 MED ORDER — SODIUM CHLORIDE 0.9 % IV SOLN
500.0000 mL | INTRAVENOUS | Status: DC
Start: 2024-02-15 — End: 2024-02-15

## 2024-02-15 NOTE — Op Note (Signed)
 [facility] Patient Name: Bonnie Hall Procedure Date: 02/15/2024 3:27 PM MRN: [id number] Endoscopist: Nannette Babe , MD, [id number] Age: 63 Referring MD:  Date of Birth: 04-Jun-1961 Gender: Female Account #: [id number] Procedure:                Colonoscopy Indications:              Generalized abdominal pain, Lower abdominal pain,                            Clinically significant diarrhea of unexplained                            origin with borderline elevated fecal calprotectin                            x 2 (84 and 118), abnormal CT scan colon in Feb                            2025 with thickening in right colon raising                            question of colitis though not seen on repeat CT                            scan in April 2025; screening colon in Dec 2023                            with adenoma x 1 Medicines:                Monitored Anesthesia Care Procedure:                Pre-Anesthesia Assessment:                           - Prior to the procedure, a History and Physical                            was performed, and patient medications and                            allergies were reviewed. The patient's tolerance of                            previous anesthesia was also reviewed. The risks                            and benefits of the procedure and the sedation                            options and risks were discussed with the patient.                            All questions were answered, and informed consent  was obtained. Prior Anticoagulants: The patient has                            taken no anticoagulant or antiplatelet agents. ASA                            Grade Assessment: III - A patient with severe                            systemic disease. After reviewing the risks and                            benefits, the patient was deemed in satisfactory                            condition to undergo the  procedure.                           After obtaining informed consent, the colonoscope                            was passed under direct vision. Throughout the                            procedure, the patient's blood pressure, pulse, and                            oxygen saturations were monitored continuously. The                            Olympus Scope SN [id number] was introduced through the                            anus and advanced to the terminal ileum. The                            colonoscopy was performed without difficulty. The                            patient tolerated the procedure well. The quality                            of the bowel preparation was good. The terminal                            ileum, ileocecal valve, appendiceal orifice, and                            rectum were photographed. Scope In: 3:49:34 PM Scope Out: 4:04:48 PM Scope Withdrawal Time: 0 hours 13 minutes 21 seconds  Total Procedure Duration: 0 hours 15 minutes 14 seconds  Findings:                 The digital rectal exam was normal.  The terminal ileum appeared normal.                           A 3 mm polyp was found in the cecum. The polyp was                            sessile. The polyp was removed with a cold snare.                            Resection and retrieval were complete.                           A 5 mm polyp was found in the sigmoid colon. The                            polyp was sessile. The polyp was removed with a                            cold snare. Resection and retrieval were complete.                           The exam was otherwise without abnormality.                           Biopsies for histology were taken with a cold                            forceps from the right colon and left colon for                            evaluation of microscopic colitis.                           Internal hemorrhoids were found during                             retroflexion. The hemorrhoids were small. Complications:            No immediate complications. Estimated Blood Loss:     Estimated blood loss was minimal. Impression:               - The examined portion of the ileum was normal.                           - One 3 mm polyp in the cecum, removed with a cold                            snare. Resected and retrieved.                           - One 5 mm polyp in the sigmoid colon, removed with                            a  cold snare. Resected and retrieved.                           - The examination was otherwise normal.                           - Small internal hemorrhoids.                           - Biopsies were taken with a cold forceps from the                            right colon and left colon for evaluation of                            microscopic colitis.                           - No evidence of inflammatory bowel disease or                            macroscopic colitis. Recommendation:           - Patient has a contact number available for                            emergencies. The signs and symptoms of potential                            delayed complications were discussed with the                            patient. Return to normal activities tomorrow.                            Written discharge instructions were provided to the                            patient.                           - Resume previous diet.                           - Continue present medications.                           - Await pathology results.                           - Repeat colonoscopy is recommended for                            surveillance. The colonoscopy date will be                            determined after pathology results from today's  exam become available for review. Nannette Babe, MD 02/15/2024 4:14:25 PM This report has been signed electronically.

## 2024-02-15 NOTE — Patient Instructions (Addendum)
 Resume previous diet and medications - Flucanazole ordered for candida (yeast) esophagitis.  Biopsy results will be sent via MyChart or letter along with follow up timeline for repeat colonoscopy.  YOU HAD AN ENDOSCOPIC PROCEDURE TODAY AT THE [facility]:   Refer to the procedure report that was given to you for any specific questions about what was found during the examination.  If the procedure report does not answer your questions, please call your gastroenterologist to clarify.  If you requested that your care partner not be given the details of your procedure findings, then the procedure report has been included in a sealed envelope for you to review at your convenience later.  YOU SHOULD EXPECT: Some feelings of bloating in the abdomen. Passage of more gas than usual.  Walking can help get rid of the air that was put into your GI tract during the procedure and reduce the bloating. If you had a lower endoscopy (such as a colonoscopy or flexible sigmoidoscopy) you may notice spotting of blood in your stool or on the toilet paper. If you underwent a bowel prep for your procedure, you may not have a normal bowel movement for a few days.  Please Note:  You might notice some irritation and congestion in your nose or some drainage.  This is from the oxygen used during your procedure.  There is no need for concern and it should clear up in a day or so.  SYMPTOMS TO REPORT IMMEDIATELY:  Following lower endoscopy (colonoscopy or flexible sigmoidoscopy):  Excessive amounts of blood in the stool  Significant tenderness or worsening of abdominal pains  Swelling of the abdomen that is new, acute  Fever of 100F or higher  Following upper endoscopy (EGD)  Vomiting of blood or coffee ground material  New chest pain or pain under the shoulder blades  Painful or persistently difficult swallowing  New shortness of breath  Fever of 100F or higher  Black, tarry-looking stools  For urgent or  emergent issues, a gastroenterologist can be reached at any hour by calling [phone]. Do not use MyChart messaging for urgent concerns.    DIET:  We do recommend a small meal at first, but then you may proceed to your regular diet.  Drink plenty of fluids but you should avoid alcoholic beverages for 24 hours.  ACTIVITY:  You should plan to take it easy for the rest of today and you should NOT DRIVE or use heavy machinery until tomorrow (because of the sedation medicines used during the test).    FOLLOW UP: Our staff will call the number listed on your records the next business day following your procedure.  We will call around 7:15- 8:00 am to check on you and address any questions or concerns that you may have regarding the information given to you following your procedure. If we do not reach you, we will leave a message.     If any biopsies were taken you will be contacted by phone or by letter within the next 1-3 weeks.  Please call us  at [phone] if you have not heard about the biopsies in 3 weeks.    SIGNATURES/CONFIDENTIALITY: You and/or your care partner have signed paperwork which will be entered into your electronic medical record.  These signatures attest to the fact that that the information above on your After Visit Summary has been reviewed and is understood.  Full responsibility of the confidentiality of this discharge information lies with you and/or your care-partner.

## 2024-02-15 NOTE — Op Note (Signed)
 [facility] Patient Name: Bonnie Hall Procedure Date: 02/15/2024 3:28 PM MRN: [id number] Endoscopist: Nannette Babe , MD, [id number] Age: 63 Referring MD:  Date of Birth: 10/04/1960 Gender: Female Account #: [id number] Procedure:                Upper GI endoscopy Indications:              Lower abdominal pain, Generalized abdominal pain,                            Gastro-esophageal reflux disease with chronic cough                            and throat clearing, Diarrhea; currently on BID                            pantoprazole  and famotidine  Medicines:                Monitored Anesthesia Care Procedure:                Pre-Anesthesia Assessment:                           - Prior to the procedure, a History and Physical                            was performed, and patient medications and                            allergies were reviewed. The patient's tolerance of                            previous anesthesia was also reviewed. The risks                            and benefits of the procedure and the sedation                            options and risks were discussed with the patient.                            All questions were answered, and informed consent                            was obtained. Prior Anticoagulants: The patient has                            taken no anticoagulant or antiplatelet agents. ASA                            Grade Assessment: III - A patient with severe                            systemic disease. After reviewing the risks and  benefits, the patient was deemed in satisfactory                            condition to undergo the procedure.                           After obtaining informed consent, the endoscope was                            passed under direct vision. Throughout the                            procedure, the patient's blood pressure, pulse, and                            oxygen saturations  were monitored continuously. The                            Olympus Scope [id number] was introduced through the                            mouth, and advanced to the second part of duodenum.                            The upper GI endoscopy was accomplished without                            difficulty. The patient tolerated the procedure                            well. Scope In: Scope Out: Findings:                 Patchy, yellow plaques were found in the upper                            third of the esophagus and in the middle third of                            the esophagus.                           The entire examined stomach was normal. Biopsies                            were taken with a cold forceps for Helicobacter                            pylori testing.                           The examined duodenum was normal. Biopsies for                            histology were taken with a cold forceps for  evaluation of celiac disease. Complications:            No immediate complications. Estimated Blood Loss:     Estimated blood loss was minimal. Impression:               - Esophageal plaques were found, consistent with                            candidiasis.                           - Normal stomach. Biopsied.                           - Normal examined duodenum. Biopsied. Recommendation:           - Patient has a contact number available for                            emergencies. The signs and symptoms of potential                            delayed complications were discussed with the                            patient. Return to normal activities tomorrow.                            Written discharge instructions were provided to the                            patient.                           - Resume previous diet.                           - Continue present medications.                           - Begin fluconazole  200 mg x 1 day, 100 mg x 13                             days for Candida esophagitis.                           - Await pathology results.                           - See the other procedure note for documentation of                            additional recommendations. Nannette Babe, MD 02/15/2024 4:09:53 PM This report has been signed electronically.

## 2024-02-15 NOTE — Progress Notes (Signed)
 Vss nad trans to pacu

## 2024-02-15 NOTE — Progress Notes (Signed)
 GASTROENTEROLOGY PROCEDURE H&P NOTE   Primary Care Physician: Estil Heman, NP    Reason for Procedure:  Lower abdominal pain, diarrhea, abnormal CT scan showing right sided colitis in February, borderline elevated fecal calprotectin, GERD  Plan:    EGD/colon  Patient is appropriate for endoscopic procedure(s) in the ambulatory ([facility]) setting.  The nature of the procedure, as well as the risks, benefits, and alternatives were carefully and thoroughly reviewed with the patient. Ample time for discussion and questions allowed. The patient understood, was satisfied, and agreed to proceed.     HPI: Bonnie Hall is a 63 y.o. female who presents for EGD and colonoscopy.  Medical history as below.  Tolerated the prep.  No recent chest pain or shortness of breath.  No abdominal pain today.  Past Medical History:  Diagnosis Date   Allergy    Hay fever/exercise induced asthma   AMA (advanced maternal age) multigravida 35+    Anxiety    on meds   Arthritis    generalized   Asthma    Blood transfusion 1990's   Colitis 10/23/2023   Depression    bipolar=on meds   GERD (gastroesophageal reflux disease)    on meds   Hyperlipidemia    on meds   Iron deficiency anemia 1990's   negative work up as to etiology; required iron infusion, blood transfusion.  Saw an hematologist  at [facility].    Left-sided Bell's palsy 02/03/2021   Migraine    Newborn product of in vitro fertilization (IVF) pregnancy    Pregnancy induced hypertension    on meds   Preterm labor    Sleep apnea    no CPAP   Thrombocytosis    Vaginal Pap smear, abnormal     Past Surgical History:  Procedure Laterality Date   CESAREAN SECTION  2009   CESAREAN SECTION N/A 03/09/2017   Procedure: REPEAT CESAREAN SECTION;  Surgeon: Thurman Flores, MD;  Location: [facility];  Service: Obstetrics;  Laterality: N/A;   COLONOSCOPY  06/2012   Dr.Kaplan-MAC-movi(exc)-normal-10 yr recall   GYNECOLOGIC  CRYOSURGERY     NASAL SEPTUM SURGERY  1983   TONSILLECTOMY  1966    Prior to Admission medications   Medication Sig Start Date End Date Taking? Authorizing Provider  atorvastatin  (LIPITOR) 10 MG tablet Take 1 tablet (10 mg total) by mouth daily. 11/30/23  Yes Ngetich, Dinah C, NP  benzonatate  (TESSALON  PERLES) 100 MG capsule Take 1 capsule (100 mg total) by mouth 3 (three) times daily as needed. 02/09/24 02/08/25 Yes Ngetich, Dinah C, NP  clonazePAM (KLONOPIN) 1 MG tablet Take 0.75 mg by mouth daily. 05/14/23 02/15/24 Yes [provider]  famotidine  (PEPCID ) 20 MG tablet Take 1 tablet (20 mg total) by mouth 2 (two) times daily. 12/05/23  Yes Ngetich, Dinah C, NP  gabapentin  (NEURONTIN ) 300 MG capsule TAKE 1 CAPSULE BY MOUTH EVERYDAY AT BEDTIME 02/13/24  Yes Jaffe, Adam R, DO  lamoTRIgine  (LAMICTAL ) 200 MG tablet Take 300 mg by mouth daily.   Yes [provider]  montelukast  (SINGULAIR ) 10 MG tablet Take 1 tablet (10 mg total) by mouth at bedtime. 12/05/23  Yes Ngetich, Dinah C, NP  ondansetron  (ZOFRAN -ODT) 4 MG disintegrating tablet Take 1 tablet (4 mg total) by mouth every 8 (eight) hours as needed for nausea or vomiting. DISSOLVE ONE TABLET ON TONGUE EVERY 8 HOURS AS NEEDED FOR NAUSEA OR VOMITING 01/31/24  Yes Ngetich, Dinah C, NP  pantoprazole  (PROTONIX ) 40 MG tablet Take  1 tablet (40 mg total) by mouth 2 (two) times daily. 12/05/23  Yes Ngetich, Dinah C, NP  traZODone (DESYREL) 100 MG tablet Take 200 mg by mouth at bedtime. 06/16/22 02/15/24 Yes [provider]  vortioxetine HBr (TRINTELLIX) 20 MG TABS tablet Take 20 mg by mouth daily.   Yes [provider]  ALPRAZolam (XANAX) 1 MG tablet Take 1 mg by mouth at bedtime as needed.    [provider]  erythromycin  ophthalmic ointment Apply to eye. 06/10/23   [provider]  lidocaine  (LIDODERM ) 5 % Place 1 patch onto the skin daily. Remove & Discard patch within 12 hours or as directed by MD 11/17/23    Melodie Spry, NP    Current Outpatient Medications  Medication Sig Dispense Refill   atorvastatin  (LIPITOR) 10 MG tablet Take 1 tablet (10 mg total) by mouth daily. 90 tablet 3   benzonatate  (TESSALON  PERLES) 100 MG capsule Take 1 capsule (100 mg total) by mouth 3 (three) times daily as needed. 30 capsule 0   clonazePAM (KLONOPIN) 1 MG tablet Take 0.75 mg by mouth daily.     famotidine  (PEPCID ) 20 MG tablet Take 1 tablet (20 mg total) by mouth 2 (two) times daily. 180 tablet 1   gabapentin  (NEURONTIN ) 300 MG capsule TAKE 1 CAPSULE BY MOUTH EVERYDAY AT BEDTIME 30 capsule 0   lamoTRIgine  (LAMICTAL ) 200 MG tablet Take 300 mg by mouth daily.     montelukast  (SINGULAIR ) 10 MG tablet Take 1 tablet (10 mg total) by mouth at bedtime. 90 tablet 1   ondansetron  (ZOFRAN -ODT) 4 MG disintegrating tablet Take 1 tablet (4 mg total) by mouth every 8 (eight) hours as needed for nausea or vomiting. DISSOLVE ONE TABLET ON TONGUE EVERY 8 HOURS AS NEEDED FOR NAUSEA OR VOMITING 60 tablet 3   pantoprazole  (PROTONIX ) 40 MG tablet Take 1 tablet (40 mg total) by mouth 2 (two) times daily. 180 tablet 1   traZODone (DESYREL) 100 MG tablet Take 200 mg by mouth at bedtime.     vortioxetine HBr (TRINTELLIX) 20 MG TABS tablet Take 20 mg by mouth daily.     ALPRAZolam (XANAX) 1 MG tablet Take 1 mg by mouth at bedtime as needed.     erythromycin  ophthalmic ointment Apply to eye.     lidocaine  (LIDODERM ) 5 % Place 1 patch onto the skin daily. Remove & Discard patch within 12 hours or as directed by MD 30 patch 0   Current Facility-Administered Medications  Medication Dose Route Frequency Provider Last Rate Last Admin   0.9 %  sodium chloride  infusion  500 mL Intravenous Continuous Tonica Brasington, Amber Bail, MD        Allergies as of 02/15/2024   (No Known Allergies)    Family History  Problem Relation Age of Onset   Alzheimer's disease Mother 20   Prostate cancer Father    Arrhythmia Father    Hearing loss Daughter     Strabismus Daughter    Asperger's syndrome Daughter    ADD / ADHD Daughter    Intellectual disability Daughter    Polycystic ovary syndrome Daughter 69   Bipolar disorder Son    Colon cancer Paternal Uncle 72   Colon cancer Paternal Uncle 73   Stroke Maternal Grandmother    Stroke Maternal Grandfather    Rheum arthritis Paternal Grandmother    Colon polyps Neg Hx    Esophageal cancer Neg Hx    Stomach cancer Neg Hx    Rectal cancer Neg  Hx     Social History   Socioeconomic History   Marital status: Married    Spouse name: Not on file   Number of children: 2   Years of education: Not on file   Highest education level: Bachelor's degree (e.g., BA, AB, BS)  Occupational History    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  Tobacco Use   Smoking status: Never    Passive exposure: Never   Smokeless tobacco: Never  Vaping Use   Vaping status: Never Used  Substance and Sexual Activity   Alcohol use: No   Drug use: No   Sexual activity: Not Currently    Partners: Male    Birth control/protection: Post-menopausal  Other Topics Concern   Not on file  Social History Narrative   Right handed   Drinks caffeine    Two story hom   Social Drivers of Health   Financial Resource Strain: Not on file  Food Insecurity: Not on file  Transportation Needs: Not on file  Physical Activity: Inactive (08/01/2019)   Exercise Vital Sign    Days of Exercise per Week: 0 days    Minutes of Exercise per Session: 0 min  Stress: Stress Concern Present (08/01/2019)   [employer] of Occupational Health - Occupational Stress Questionnaire    Feeling of Stress : Very much  Social Connections: Not on file  Intimate Partner Violence: Not on file    Physical Exam: Vital signs in last 24 hours: @BP  111/66   Pulse 67   Temp 98.3 F (36.8 C) (Temporal)   Ht 5\' 6"  (1.676 m)   Wt 139 lb (63 kg)   LMP 03/12/2012   SpO2 96%   BMI 22.44 kg/m  GEN: NAD EYE: Sclerae anicteric ENT: MMM CV:  Non-tachycardic Pulm: CTA b/l GI: Soft, NT/ND NEURO:  Alert & Oriented x 3   Laurell Pond, MD [facility]  02/15/2024 3:29 PM

## 2024-02-16 ENCOUNTER — Telehealth: Payer: Self-pay

## 2024-02-16 ENCOUNTER — Other Ambulatory Visit

## 2024-02-16 ENCOUNTER — Ambulatory Visit
Admission: RE | Admit: 2024-02-16 | Discharge: 2024-02-16 | Discharge status: Home / Self Care | Source: Ambulatory Visit | Attending: Family | Admitting: Family

## 2024-02-16 DIAGNOSIS — M542 Cervicalgia: Secondary | ICD-10-CM

## 2024-02-16 MED ORDER — GADOPICLENOL 0.5 MMOL/ML IV SOLN
6.0000 mL | Freq: Once | INTRAVENOUS | Status: AC | PRN
  Administered 2024-02-16: 6 mL via INTRAVENOUS

## 2024-02-16 NOTE — Telephone Encounter (Signed)
 Left message

## 2024-02-20 ENCOUNTER — Telehealth: Payer: Self-pay | Admitting: Neurology

## 2024-02-20 ENCOUNTER — Telehealth: Payer: Self-pay

## 2024-02-20 ENCOUNTER — Telehealth: Payer: Self-pay | Admitting: Internal Medicine

## 2024-02-20 LAB — SURGICAL PATHOLOGY

## 2024-02-20 NOTE — Telephone Encounter (Signed)
 Pt is wanting to speak to someone about what is going on with her. She is having lightheadness and falling over to the side, her headaches are getting worse not better, she has appt in Oct with Dr Festus Hubert and is on the wait list. She states that the Gabapentin  is not working.   She uses the [facility] on [address]

## 2024-02-20 NOTE — Telephone Encounter (Signed)
 Mychart message sent to patient.

## 2024-02-20 NOTE — Telephone Encounter (Signed)
 Pt called an informed For the headache, we can increase gabapentin  to 300mg  twice daily. Dr Festus Hubert doesn't  have an explanation for the lightheadedness and the confusion/slurred speech is new to me.  But her MRI and MRA of the head did not reveal anything concerning and Dr Festus Hubert would recommend following back up with her PCP to find other potential causes.  Pt is very upset said that Dr Festus Hubert is passing the buck that she wants him to call her. She said that he is taken this very lightly her problems are neurological and she should not be sent to her PCP Dr Festus Hubert should be taken care of this. She said God forbids she dies because he doesn't want to take care of her problems

## 2024-02-20 NOTE — Telephone Encounter (Signed)
 Inbound call from patient, would like to speak with a nurse, states she wants an urgent appt with Marlin Simmonds to discuss the abdominal pain she is having. She states she has done every test Marlin Simmonds has ordered but does not see any improvement in her symptoms. She would like to discuss further plan of care.

## 2024-02-20 NOTE — Telephone Encounter (Signed)
 Pt wanted you to know that her BP has been normal at times of light headiness Pt gabapentin  300 mg not working headaches are getting worse none of the headache RX are working,  She is asking what is plan B  She stated that she is having confusing and slurred speech that has gradually gotten worse since her appointment in April no start date  No weakness noted on either side She has had falls but no injuries noted just has trouble getting up when she falls,

## 2024-02-20 NOTE — Telephone Encounter (Signed)
 Dr Bridgett Camps,   Please review patient's recent endo/colon pathology report and advise.  Patient calls with continued complaints of abdominal pain and wants appointment to discuss.

## 2024-02-20 NOTE — Telephone Encounter (Signed)
 Copied from CRM [id number]. Topic: Clinical - Medical Advice >> Feb 20, 2024 10:38 AM Shelby Dessert H wrote: Reason for CRM: Patient is calling to speak to provider about her different test and labs and needs her to look at the whole overall picture of what's going on, patients callback number is [phone].

## 2024-02-21 ENCOUNTER — Encounter: Payer: Self-pay | Admitting: Neurology

## 2024-02-21 ENCOUNTER — Ambulatory Visit: Payer: Self-pay | Admitting: Internal Medicine

## 2024-02-21 ENCOUNTER — Telehealth: Payer: Self-pay | Admitting: Neurology

## 2024-02-21 NOTE — Telephone Encounter (Signed)
 Pt called and wants to speak with jaffe only. She doesn't want to speak with any nurse. If a nurse calls to talk, she will keep calling and asking for jaffe.

## 2024-02-21 NOTE — Telephone Encounter (Signed)
 See result note from EGD/colon pathology result

## 2024-02-22 NOTE — Telephone Encounter (Signed)
 [name], RN 02/21/2024  1:31 PM EDT Back to [name] with pt and she is aware of results and recommendations per Dr Bridgett Camps. Recall entered for 7 years. Pt scheduled to see Everett Hitt NP 03/05/24@1 :30pm. Pt aware of appt.   Nannette Babe, MD 02/21/2024 12:58 PM EDT     Pt called requesting advice and pathology results.  See recent telephone note from Jewelene Morton, RN   Please make patient aware that her stomach biopsies showed mild to moderate chronic inflammation in the stomach but no evidence of H. pylori infection. The small bowel biopsies were normal The random colon biopsies were normal The 2 small polyps that were removed were benign adenomas   Please place recall colonoscopy for 7 years --this replaces any previous recall in place; thus next colonoscopy recommended June 2032   She has questions regarding ongoing abdominal pain: She has had CT scan which did not show pathology; previous CT scan suggested right sided colitis but this was not seen at colonoscopy nor at most recent CT.  It did suggest constipation with the patient expressed to me at colonoscopy that she does not feel constipated.   Thus, I would have her come back to the office to see Marlin Simmonds or myself to discuss further; if functional in nature could consider TCA but would have to discuss with patient in person first   Phone call and thus no pathology letter needed

## 2024-02-23 DIAGNOSIS — Z419 Encounter for procedure for purposes other than remedying health state, unspecified: Secondary | ICD-10-CM | POA: Diagnosis not present

## 2024-02-24 ENCOUNTER — Telehealth: Payer: Self-pay | Admitting: Family

## 2024-02-24 NOTE — Telephone Encounter (Signed)
 MRI of the brain done 01/18/2024 results already in the chart do not need to request for results from [facility].

## 2024-02-24 NOTE — Telephone Encounter (Signed)
 Copied from CRM [id number]. Topic: Medical Record Request - Other >> Feb 24, 2024 10:51 AM Blair Bumpers wrote: Reason for CRM: Patient called in stating that she signed a form for the clinic to send out to multiple locations to get copies of MRI & notes. There was one that patient states she could not remember and is calling back to provide that information. She states it was for an MRI Brain Scan & Angio Head that was done at [facility] on 01/18/24. States she trying to make sure that Dr. Nedra Ball has all of the records and information so that they can sit down & talk about next steps. Patient states there should be a blank form in her file that she signed in the office.   Called patient and verified location she needed added to release of information and have faxed request to [facility] at [phone] Patient would like us  to call her when we get the results so Nedra Ball can go over them with patient

## 2024-02-24 NOTE — Telephone Encounter (Signed)
 MRI of the brain was ordered by another provider.Per chart results were already discussed with patient by Dr.Adam R.Jaffe.Please see Mychart message.

## 2024-02-24 NOTE — Telephone Encounter (Signed)
 Called patient no answer, patient called us  wanting to be seen to go over MRI of her head , per Her PCP she was already following up with her [facility] doctor about the issue per her Mychart messages and corresponding appointment that was made for 6/19. Patient just needs to follow up with appointments she already has made. If she calls back

## 2024-02-24 NOTE — Telephone Encounter (Signed)
 Please advise when you have went over results so patient can be scheduled

## 2024-02-28 ENCOUNTER — Encounter: Payer: Self-pay | Admitting: Physical Medicine & Rehabilitation

## 2024-02-28 ENCOUNTER — Encounter: Attending: Physical Medicine & Rehabilitation | Admitting: Physical Medicine & Rehabilitation

## 2024-02-28 VITALS — BP 101/65 | HR 78 | Ht 65.0 in | Wt 144.0 lb

## 2024-02-28 DIAGNOSIS — M542 Cervicalgia: Secondary | ICD-10-CM | POA: Insufficient documentation

## 2024-02-28 DIAGNOSIS — M545 Low back pain, unspecified: Secondary | ICD-10-CM | POA: Insufficient documentation

## 2024-02-28 DIAGNOSIS — G8929 Other chronic pain: Secondary | ICD-10-CM | POA: Insufficient documentation

## 2024-02-28 NOTE — Progress Notes (Signed)
 Subjective:    Patient ID: Bonnie Hall, female    DOB: [birth year], 63 y.o.   MRN: [id number]  HPI  CC:  Low back and mid back pain  Pain worsening since February of this year, no discrete injury   Was having cervical and lumbar pain as well as headache pain Sees a neurologist for HA and Dizziness On multiple medications for depression as well as anxiety.  She is on Klonopin.  We discussed that benzodiazepines have a black box warning in combination with opiates.  The patient did mention that she gets good relief with hydrocodone  and oxycodone .  We discussed the patient's complaint of worsening pain in low back area however  pain score average from today and last visit (which was around 8 months ago) both 7/10.  Last rheumatology note Tests for rheumatoid arthritis including rheumatoid factor, anti-CCP and MCV antibodies were negative.  Sed rate normal and C-reactive protein normal which indicates no inflammation.     Pain Inventory Average Pain 7 Pain Right Now 8 My pain is constant, sharp, and stabbing  In the last 24 hours, has pain interfered with the following? General activity 7 Relation with others 7 Enjoyment of life 7 What TIME of day is your pain at its worst? morning , daytime, evening, and night Sleep (in general) Good  Pain is worse with: walking, sitting, inactivity, standing, and some activites Pain improves with: medication Relief from Meds: 8  Family History  Problem Relation Age of Onset   Alzheimer's disease Mother 57   Prostate cancer Father    Arrhythmia Father    Hearing loss Daughter    Strabismus Daughter    Asperger's syndrome Daughter    ADD / ADHD Daughter    Intellectual disability Daughter    Polycystic ovary syndrome Daughter 9   Bipolar disorder Son    Colon cancer Paternal Uncle 60   Colon cancer Paternal Uncle 81   Stroke Maternal Grandmother    Stroke Maternal Grandfather    Rheum arthritis Paternal Grandmother    Colon  polyps Neg Hx    Esophageal cancer Neg Hx    Stomach cancer Neg Hx    Rectal cancer Neg Hx    Social History   Socioeconomic History   Marital status: Married    Spouse name: Not on file   Number of children: 2   Years of education: Not on file   Highest education level: Bachelor's degree (e.g., BA, AB, BS)  Occupational History    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  Tobacco Use   Smoking status: Never    Passive exposure: Never   Smokeless tobacco: Never  Vaping Use   Vaping status: Never Used  Substance and Sexual Activity   Alcohol use: No   Drug use: No   Sexual activity: Not Currently    Partners: Male    Birth control/protection: Post-menopausal  Other Topics Concern   Not on file  Social History Narrative   Right handed   Drinks caffeine    Two story hom   Social Drivers of Health   Financial Resource Strain: Not on file  Food Insecurity: Not on file  Transportation Needs: Not on file  Physical Activity: Inactive (08/01/2019)   Exercise Vital Sign    Days of Exercise per Week: 0 days    Minutes of Exercise per Session: 0 min  Stress: Stress Concern Present (08/01/2019)   [employer] of Occupational Health - Occupational Stress Questionnaire    Feeling of Stress :  Very much  Social Connections: Not on file   Past Surgical History:  Procedure Laterality Date   CESAREAN SECTION  2009   CESAREAN SECTION N/A 03/09/2017   Procedure: REPEAT CESAREAN SECTION;  Surgeon: Thurman Flores, MD;  Location: [facility] BIRTHING SUIT[facility];  Service: Obstetrics;  Laterality: N/A;   COLONOSCOPY  06/2012   Dr.Kaplan-MAC-movi(exc)-normal-10 yr recall   GYNECOLOGIC CRYOSURGERY     NASAL SEPTUM SURGERY  1983   TONSILLECTOMY  1966   Past Surgical History:  Procedure Laterality Date   CESAREAN SECTION  2009   CESAREAN SECTION N/A 03/09/2017   Procedure: REPEAT CESAREAN SECTION;  Surgeon: Thurman Flores, MD;  Location: [facility] BIRTHING SUITES;  Service: Obstetrics;  Laterality: N/A;    COLONOSCOPY  06/2012   Dr.Kaplan-MAC-movi(exc)-normal-10 yr recall   GYNECOLOGIC CRYOSURGERY     NASAL SEPTUM SURGERY  1983   TONSILLECTOMY  1966   Past Medical History:  Diagnosis Date   Allergy    Hay fever/exercise induced asthma   AMA (advanced maternal age) multigravida 35+    Anxiety    on meds   Arthritis    generalized   Asthma    Blood transfusion 1990's   Colitis 10/23/2023   Depression    bipolar=on meds   GERD (gastroesophageal reflux disease)    on meds   Hyperlipidemia    on meds   Iron deficiency anemia 1990's   negative work up as to etiology; required iron infusion, blood transfusion.  Saw an hematologist  at [facility].    Left-sided Bell's palsy 02/03/2021   Migraine    Newborn product of in vitro fertilization (IVF) pregnancy    Pregnancy induced hypertension    on meds   Preterm labor    Sleep apnea    no CPAP   Thrombocytosis    Vaginal Pap smear, abnormal    Ht 5' 5 (1.651 m)   Wt 144 lb (65.3 kg)   LMP 03/12/2012   BMI 23.96 kg/m   Opioid Risk Score:   Fall Risk Score:  `1  Depression screen [facility] 2/9     02/28/2024   10:31 AM 11/28/2023    9:58 AM 11/09/2023    9:21 AM 06/28/2023   10:23 AM 09/09/2021   10:17 AM 09/09/2020   10:22 AM 08/01/2019    2:09 PM  Depression screen PHQ 2/9  Decreased Interest 1 2 2 2 3 3 3   Down, Depressed, Hopeless 1 2 2 2 3 3 3   PHQ - 2 Score 2 4 4 4 6 6 6   Altered sleeping  0 1 1 2 3 3   Tired, decreased energy  2 2 2 3 3 3   Change in appetite  1 2 1 3 3 3   Feeling bad or failure about yourself   2 2 2 3 3 3   Trouble concentrating  2 0 1 3 3 3   Moving slowly or fidgety/restless  0 0 0 1 3 1   Suicidal thoughts  0 0 0 0 0 0  PHQ-9 Score  11 11 11 21 24 22   Difficult doing work/chores  Very difficult Very difficult  Very difficult Very difficult Very difficult    Review of Systems  Musculoskeletal:  Positive for back pain and neck pain.  Neurological:  Positive for light-headedness and headaches.  All  other systems reviewed and are negative.      Objective:   Physical Exam  General No acute distress Mood and affect appropriate Extremities without edema Lumbar spine has normal  range of motion there is mild tenderness palpation lumbar paraspinals around L4-S1 region. There is no tenderness along the thoracic spine there is mild kyphosis Cervical spine has no tenderness along the spinous processes or along the cervical paraspinals.  Her cervical spine range of motion was 50 to 75% in all directions.  No evidence of crepitus. Negative foraminal compression Motor strength is 5/5 bilateral deltoid by stress grip hip flexor knee extensor ankle dorsiflexor Negative straight leg raise bilaterally Sensation normal bilateral upper lower extremities to light touch Deep tendon reflexes 1+ bilateral patellar and ankle as well as triceps 3+ bilateral bicep and brachial radialis. Ambulates without assistive device no evidence of greater knee stability Speech mildly pressured no evidence of dysarthria Mental status no evidence of lethargy      Assessment & Plan:   1.  Cervical spondylosis, MRI from 02/16/2024 reviewed there is evidence of disc degenerative bulge and spondylitic changes at C5-6 and C6-7.  No cord signal changes that I can appreciate.  She has no weakness or numbness but has some mild hyperreflexia at biceps and brachial radialis.  We discussed that her feeling of neck stiffness and pain would be consistent with these findings but fortunately she does not have any signs of cervical radiculopathy. 2.  Chronic lumbar pain she has had a recent MRI ordered by [facility] she plans to follow-up with them I did not have access to these films but will request report. Her lumbar exam is relatively unremarkable I suspect her lumbar pain is multifactorial and likely degenerative in nature.  She has no clear-cut signs of lumbar radiculopathy. We discussed the importance of  physical therapy and keeping up with home exercise  even once therapy has concluded.  If she does not feel like she has had any benefit she will follow-up with me. We discussed that she may have some degree of hypersensitivity to pain but not clear-cut fibromyalgia.  I do agree that she may benefit from a trial of gabapentin  which she has recently started.  We discussed other treatment options including Lyrica.  Not a good candidate for SNRI since she is already on SSRI

## 2024-02-28 NOTE — Progress Notes (Signed)
 NEUROLOGY FOLLOW UP OFFICE NOTE  [name] [id number]  Assessment/Plan:   Lightheadedness/near syncope.  Not associated with orthostatic hypotension.  Must evaluate underlying cardiac etiology. Chronic tension type headache vs new daily persistent headache Sudden episodic gait instability - unclear etiology.  Not consistent with dizziness.  Not associated with sudden leg weakness or other focal abnormality Etiology for constellation of symptoms unclear.  They clearly became evident after her episode of colitis, raising possibility of some chronic post-inflammatory syndrome   For treatment of headaches, would prefer to use nortriptyline or topiramate.  Second line would be divalproex.  Will contact her psychiatrist's office to determine if they have a preference to one or that either medication is not recommended. Refer to cardiology for further evaluation of near syncope Physical therapy for gait instability Follow up 6 months.  Total time spent in chart and face to face with patient:  47 minutes   Subjective:  Bonnie Hall is a 63 year old female with HLD, asthma, colitis, Bipolar disorder, depression and anxiety, Meig's syndrome and history of left-sided Bell's palsy who follows up for headache and dizziness.  UPDATE: MRI and MRA of head on 01/18/2024 revealed mild white matter changes in the predominantly frontal lobes and one old chronic microhemorrhage in the right parietal lobe but no acute intracranial abnormality, aneurysm or high-grade stenosis.  Feels that symptoms are getting worse.  Started gabapentin  300mg  at bedtime for headaches.  Not effective but did not want to increase dose because she read that gabapentin  may contribute to developing dementia.  However, her spine specialist recommended increasing gabapentin  to three times daily for the back pain.    Headaches remain persistent, a dull 3-4/10 frontal aching.  Episodes of lightheadedness (like she  is going to pass out) occurs spontaneously, lasts 1 to 2 minutes.  Does not occur with headache.  Not aware of palpitations.  Since last visit, she has continued to have episodes of losing balance in which she suddenly will stumble to one side or the other.  She fell on a couple of occasions in the middle of the night when she got up to use the bathroom.  Not associated with lightheadedness.  Current NSAIDS/analgesics:  Fioricet Current triptans:  none Current ergotamine:  none Current anti-emetic:  Zofran -ODT 4mg  Current muscle relaxants:  none Current Antihypertensive medications:  none Current Antidepressant medications:  Trintellix Current Anticonvulsant medications:  lamotrigine  300mg  daily, gabapentin  300mg  three times daily Current anti-CGRP:  none Other medications:  alprazolam  Caffeine :  No coffee.  A little bit of cola daily. Hydrates Sleep hygiene:  wakes up once in middle of night and once early in morning to use bathroom.  Able to quickly fall asleep.  Snores.  Was previously diagnosed with OSA years ago and used CPAP for short time.  No excessive daytime sleepiness.    HISTORY: She was hospitalized on February 9 for abdominal pain, nausea and diarrhea and was diagnosed with colitis.  Since then, she has had a persistent dull small circumscribed pressure/throbbing pain in the frontal region between or above either eyebrow.  Occurs daily and lasts all day.  No associated photophobia, phonophobia or visual disturbance.  Has nausea due to colitis but not specifically associated with these headaches.  No specific triggers or exacerbating factors.  Does not respond to her Fioricet or OTC analgesics such as Tylenol  or ibuprofen .  She has cervical spondylosis with some neck pain and underwent chiropractic manipulation which did not  help the  headache  At the same time, she has been experiencing lightheadedness, a sensation that she is going to pass out.  No associated tunnel vision,  diaphoresis, chest pain or palpitations.  Not positional and may occur any time, even just while quietly sitting.  It has never happened while driving.  Occurs daily.    She also endorses brief episodes of dizziness.  While walking, she may suddenly lose balance and veer to either side.  Lasts seconds to a couple of minutes.  No associated dizziness or similar sensation in her head.  Leg does not give out.  It just happens.  It has caused her to bump into walls and furniture.  This also occurs daily.  She has longstanding history of migraines that are currently well controlled.  They usually start in back of head or front and radiates diffusely, a severe (10/10) pounding pain associated with nausea, vomiting, photophobia and phonophobia.  If Fioricet taken early, gets relief in 15 minutes.  Hasn't had one in a while.     She had an unremarkable MRI of the brain without contrast on 01/28/2021 for left sided facial weakness which was ultimately found to be Bell's palsy.    Past NSAIDS/analgesics:  Tylenol , ibuprofen  Past anti-emetic:  promethazine  25mg  Past antihypertensive medications:  labetolol Past antidepressant medications:  Wellbutrin Past antiepileptic medication:  none     PAST MEDICAL HISTORY: Past Medical History:  Diagnosis Date   Allergy    Hay fever/exercise induced asthma   AMA (advanced maternal age) multigravida 35+    Anxiety    on meds   Arthritis    generalized   Asthma    Blood transfusion 1990's   Colitis 10/23/2023   Depression    bipolar=on meds   GERD (gastroesophageal reflux disease)    on meds   Hyperlipidemia    on meds   Iron deficiency anemia 1990's   negative work up as to etiology; required iron infusion, blood transfusion.  Saw an hematologist  at [facility].    Left-sided Bell's palsy 02/03/2021   Migraine    Newborn product of in vitro fertilization (IVF) pregnancy    Pregnancy induced hypertension    on meds   Preterm labor    Sleep apnea    no  CPAP   Thrombocytosis    Vaginal Pap smear, abnormal     MEDICATIONS: Current Outpatient Medications on File Prior to Visit  Medication Sig Dispense Refill   ALPRAZolam (XANAX) 1 MG tablet Take 1 mg by mouth at bedtime as needed.     atorvastatin  (LIPITOR) 10 MG tablet Take 1 tablet (10 mg total) by mouth daily. 90 tablet 3   azithromycin  (ZITHROMAX ) 250 MG tablet TAKE 2 TABLETS BY MOUTH TODAY, THEN TAKE 1 TABLET DAILY FOR 4 DAYS AS DIRECTED     benzonatate  (TESSALON  PERLES) 100 MG capsule Take 1 capsule (100 mg total) by mouth 3 (three) times daily as needed. 30 capsule 0   clonazePAM (KLONOPIN) 1 MG tablet Take 0.75 mg by mouth daily.     dicyclomine  (BENTYL ) 10 MG capsule TAKE 1 CAPSULE (10 MG TOTAL) BY MOUTH EVERY 8 (EIGHT) HOURS AS NEEDED (ABDOMINAL PAIN).     erythromycin  ophthalmic ointment Apply to eye.     famotidine  (PEPCID ) 20 MG tablet Take 1 tablet (20 mg total) by mouth 2 (two) times daily. 180 tablet 1   fluconazole  (DIFLUCAN ) 100 MG tablet Take 2 tablets (200 mg total) by mouth daily for 1 day, THEN 1  tablet (100 mg total) daily for 13 days. 15 tablet 0   gabapentin  (NEURONTIN ) 100 MG capsule Take 1 capsule by mouth at bedtime.     gabapentin  (NEURONTIN ) 300 MG capsule TAKE 1 CAPSULE BY MOUTH EVERYDAY AT BEDTIME (Patient not taking: Reported on 02/28/2024) 30 capsule 0   gabapentin  (NEURONTIN ) 300 MG capsule Take 1 capsule by mouth.     hyoscyamine  (LEVSIN  SL) 0.125 MG SL tablet DISSOLVE 1 TABLET UNDER THE TONGUE EVERY 6-8 HOURS AS NEEDED FOR ABDOMINAL PAIN     lamoTRIgine  (LAMICTAL ) 200 MG tablet Take 300 mg by mouth daily.     lidocaine  (LIDODERM ) 5 % Place 1 patch onto the skin daily. Remove & Discard patch within 12 hours or as directed by MD 30 patch 0   meloxicam  (MOBIC ) 7.5 MG tablet Take 1 tablet by mouth daily. (Patient not taking: Reported on 02/28/2024)     montelukast  (SINGULAIR ) 10 MG tablet Take 1 tablet (10 mg total) by mouth at bedtime. 90 tablet 1   Na  Sulfate-K Sulfate-Mg Sulfate concentrate (SUPREP) 17.5-3.13-1.6 GM/177ML SOLN See admin instructions. (Patient not taking: Reported on 02/28/2024)     ondansetron  (ZOFRAN -ODT) 4 MG disintegrating tablet Take 1 tablet (4 mg total) by mouth every 8 (eight) hours as needed for nausea or vomiting. DISSOLVE ONE TABLET ON TONGUE EVERY 8 HOURS AS NEEDED FOR NAUSEA OR VOMITING 60 tablet 3   oxyCODONE -acetaminophen  (PERCOCET/ROXICET) 5-325 MG tablet TAKE 1 TABLET BY MOUTH EVERY 4 (FOUR) HOURS AS NEEDED FOR SEVERE PAIN (PAIN SCORE 7-10).     pantoprazole  (PROTONIX ) 40 MG tablet Take 1 tablet (40 mg total) by mouth 2 (two) times daily. 180 tablet 1   predniSONE  (DELTASONE ) 20 MG tablet TAKE 3 TABLETS BY MOUTH DAILY FOR 5 DAYS, THEN 2 TABLETS DAILY FOR 5 DAYS (Patient not taking: Reported on 02/28/2024)     PREDNISONE , PAK, PO TAKE 6 TABLETS ON DAY 1 AS DIRECTED ON PACKAGE AND DECREASE BY 1 TAB EACH DAY FOR A TOTAL OF 6 DAYS     sucralfate  (CARAFATE ) 1 GM/10ML suspension TAKE 10ML BY MOUTH TWICE A DAY     traZODone (DESYREL) 100 MG tablet Take 200 mg by mouth at bedtime.     vortioxetine HBr (TRINTELLIX) 20 MG TABS tablet Take 20 mg by mouth daily.     No current facility-administered medications on file prior to visit.    ALLERGIES: No Known Allergies  FAMILY HISTORY: Family History  Problem Relation Age of Onset   Alzheimer's disease Mother 23   Prostate cancer Father    Arrhythmia Father    Hearing loss Daughter    Strabismus Daughter    Asperger's syndrome Daughter    ADD / ADHD Daughter    Intellectual disability Daughter    Polycystic ovary syndrome Daughter 64   Bipolar disorder Son    Colon cancer Paternal Uncle 68   Colon cancer Paternal Uncle 12   Stroke Maternal Grandmother    Stroke Maternal Grandfather    Rheum arthritis Paternal Grandmother    Colon polyps Neg Hx    Esophageal cancer Neg Hx    Stomach cancer Neg Hx    Rectal cancer Neg Hx       Objective:  Blood pressure  105/60, pulse 79, height 5' 6 (1.676 m), weight 145 lb (65.8 kg), last menstrual period 03/12/2012, SpO2 96%. General: No acute distress.  Patient appears well-groomed.   Head:  Normocephalic/atraumatic Eyes:  Fundi examined but not visualized Neck: supple, no  paraspinal tenderness, full range of motion Heart:  Regular rate and rhythm Lungs:  Clear to auscultation bilaterally Back: No paraspinal tenderness Neurological Exam: alert and oriented.  Speech fluent and not dysarthric, language intact.  CN II-XII intact. Bulk and tone normal, muscle strength 5/5 throughout.  Sensation to temperature and vibration intact.  Deep tendon reflexes 2+ throughout, toes downgoing.  Finger to nose testing intact.  Gait normal.  Able to turn but now has some difficulty with tandem walk.  Romberg negative.   Juliene Dunnings, DO  CC: Roxan Plough, NP

## 2024-02-28 NOTE — Patient Instructions (Addendum)
 Heating pad for low back   Complete PT and if no improvement come back to see me   I will need to review the Lumbar MRI and will request copy from [facility]  Dr Festus Hubert will treat HA  May ask Psychiatry about weaning clonazepam

## 2024-03-01 ENCOUNTER — Ambulatory Visit (INDEPENDENT_AMBULATORY_CARE_PROVIDER_SITE_OTHER): Admitting: Neurology

## 2024-03-01 ENCOUNTER — Encounter: Payer: Self-pay | Admitting: Neurology

## 2024-03-01 VITALS — BP 105/60 | HR 79 | Ht 66.0 in | Wt 145.0 lb

## 2024-03-01 DIAGNOSIS — G44229 Chronic tension-type headache, not intractable: Secondary | ICD-10-CM | POA: Diagnosis not present

## 2024-03-01 DIAGNOSIS — R55 Syncope and collapse: Secondary | ICD-10-CM

## 2024-03-01 NOTE — Patient Instructions (Signed)
 Will contact your psychiatrist's office to find out which medication is okay to start Refer to cardiology regarding lightheadedness Agree with physical therapy Follow up 6 months

## 2024-03-04 ENCOUNTER — Encounter: Payer: Self-pay | Admitting: Neurology

## 2024-03-04 NOTE — Progress Notes (Unsigned)
 03/04/2024 Bonnie Hall [id number] 10-26-1960   Chief Complaint:  History of Present Illness: Bonnie Hall is a 63 year old female with a past medical history of anxiety, depression, migraine headaches, arthritis, asthma, IDA, GERD and colon polyps. She is known by Dr. Albertus.     She underwent a colonoscopy 09/08/2022 which identified 1 tubular adenomatous polyp removed from the descending colon and 1 hyperplastic polyp removed from the distal sigmoid colon. She was advised to repeat a colonoscopy in 7 years. No known family history of colon polyps, colorectal cancer or IBD.   CTAP with contrast 12/30/2023:  FINDINGS: Lower chest: No infiltrates or consolidations, no pleural effusions   Hepatobiliary: Liver normal size no masses no biliary dilatation. Gallbladder unremarkable. No gallstones.   Pancreas: Pancreas normal size. No masses calcifications or inflammatory changes.   Spleen: Spleen normal size.  No masses.   Adrenals/Urinary Tract: Adrenal glands are normal size. Follow-up recommended. Kidneys are normal. No masses calcifications or hydronephrosis   Following the intravenous administration of contrast material there is prompt bilateral symmetric nephrograms with symmetric excretion of contrast in the pelvicalyceal system and ureters. There is no persistent intraluminal filling defects or evidence of papillary necrosis.   Both ureters are normal size and trajectory.   The bladder appears grossly unremarkable without evidence of masses.   Please note the delayed images did not include bladder or pelvis.   Stomach/Bowel: No small or large bowel obstruction or inflammatory changes.   Moderate amount of residual fecal material throughout the colon without obstruction or constipation.   Vascular/Lymphatic: No significant vascular findings are present. No enlarged abdominal or pelvic lymph nodes.   Reproductive: .  No masses.   Bladder  unremarkable.   Other: Anterior abdominal wall unremarkable without evidence of umbilical or inguinal hernias   Musculoskeletal: Visualized portion of the thoracolumbar spine and pelvic structures grossly unremarkable without evidence of fracture bony abnormalities or soft tissue masses.   IMPRESSION: *No acute intra-abdominal or pelvic pathology. *Moderate amount of residual fecal material throughout the colon without obstruction or constipation.   Previously described inflammatory changes of the cecum and no longer present.  PAST GI PROCEDURES:  EGD 02/15/2024: - Esophageal plaques were found, consistent with candidiasis.  - Normal stomach. Biopsied.  - Normal examined duodenum. Biopsied.  - Treated with Fluconazole  200mg  x 1 day then 100mg  every day x 13 days  Colonoscopy 02/15/2024: - The examined portion of the ileum was normal.  - One 3 mm polyp in the cecum, removed with a cold snare. Resected and retrieved.  - One 5 mm polyp in the sigmoid colon, removed with a cold snare. Resected and retrieved.  - The examination was otherwise normal. - Small internal hemorrhoids. - Biopsies were taken with a cold forceps from the right colon and left colon for evaluation of microscopic colitis.  - No evidence of inflammatory bowel disease or macroscopic colitis - 7 year recall colonoscopy   1. Surgical [P], duodenal biopsy :      -  DUODENAL MUCOSA WITH SIGNIFICANT PATHOLOGY.       2. 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      -  AN IMMUNOHISTOCHEMICAL STAIN FOR HELICOBACTER PYLORI ORGANISMS IS PENDING AND      WILL BE REPORTED IN AN ADDENDUM.       3. Surgical [P], colon, cecum polyp x 1,  sigmoid polyp x 1, polyp (2) :      -  TUBULAR ADENOMA, FRAGMENTS.       4. Surgical [P], random colon biopsy sites :      -  COLONIC MUCOSA WITH NO SIGNIFICANT PATHOLOGY.         Colonoscopy 09/08/2022: -  One 5 mm polyp in the descending colon, removed with a cold snare. Resected and retrieved.  - One 7 mm polyp in the distal sigmoid colon, removed with a cold snare. Resected and retrieved.  - Small internal hemorrhoids. - 7 year recall  1. Surgical [P], colon, descending polyp x1, polyp (1)  - TUBULAR ADENOMA (MULTIPLE FRAGMENTS) - NEGATIVE FOR HIGH-GRADE DYSPLASIA OR MALIGNANCY 2. Surgical [P], colon, sigmoid polyp x1, polyp (1) - COLONIC MUCOSA WITH HYPERPLASTIC CHANGES AND UNDERLYING REACTIVE LYMPHOID AGGREGATE - NEGATIVE FOR MALIGNANCY     Current Medications, Allergies, Past Medical History, Past Surgical History, Family History and Social History were reviewed in [employer] record.   Review of Systems:   Constitutional: Negative for fever, sweats, chills or weight loss.  Respiratory: Negative for shortness of breath.   Cardiovascular: Negative for chest pain, palpitations and leg swelling.  Gastrointestinal: See HPI.  Musculoskeletal: Negative for back pain or muscle aches.  Neurological: Negative for dizziness, headaches or paresthesias.    Physical Exam: LMP 03/12/2012  General: in no acute distress. Head: Normocephalic and atraumatic. Eyes: No scleral icterus. Conjunctiva pink . Ears: Normal auditory acuity. Mouth: Dentition intact. No ulcers or lesions.  Lungs: Clear throughout to auscultation. Heart: Regular rate and rhythm, no murmur. Abdomen: Soft, nontender and nondistended. No masses or hepatomegaly. Normal bowel sounds x 4 quadrants.  Rectal: Deferred.  Musculoskeletal: Symmetrical with no gross deformities. Extremities: No edema. Neurological: Alert oriented x 4. No focal deficits.  Psychological: Alert and cooperative. Normal mood and affect  Assessment and Recommendations: ***

## 2024-03-05 ENCOUNTER — Ambulatory Visit (INDEPENDENT_AMBULATORY_CARE_PROVIDER_SITE_OTHER): Admitting: Nurse Practitioner

## 2024-03-05 ENCOUNTER — Encounter: Payer: Self-pay | Admitting: Nurse Practitioner

## 2024-03-05 ENCOUNTER — Encounter: Payer: Self-pay | Admitting: Neurology

## 2024-03-05 VITALS — BP 110/70 | HR 76 | Ht 63.75 in | Wt 145.0 lb

## 2024-03-05 DIAGNOSIS — K219 Gastro-esophageal reflux disease without esophagitis: Secondary | ICD-10-CM | POA: Diagnosis not present

## 2024-03-05 DIAGNOSIS — R1084 Generalized abdominal pain: Secondary | ICD-10-CM | POA: Diagnosis not present

## 2024-03-05 DIAGNOSIS — Z860101 Personal history of adenomatous and serrated colon polyps: Secondary | ICD-10-CM

## 2024-03-05 DIAGNOSIS — R109 Unspecified abdominal pain: Secondary | ICD-10-CM

## 2024-03-05 NOTE — Patient Instructions (Signed)
 Please follow up with your Neurologist and Primary Care Physician regarding your chronic pain treatment.  Please follow up sooner if symptoms increase or worsen  Due to recent changes in healthcare laws, you may see the results of your imaging and laboratory studies on MyChart before your provider has had a chance to review them.  We understand that in some cases there may be results that are confusing or concerning to you. Not all laboratory results come back in the same time frame and the provider may be waiting for multiple results in order to interpret others.  Please give us  48 hours in order for your provider to thoroughly review all the results before contacting the office for clarification of your results.   Thank you for trusting me with your gastrointestinal care!   Elida Shawl, NP _______________________________________________________  If your blood pressure at your visit was 140/90 or greater, please contact your primary care physician to follow up on this.  _______________________________________________________  If you are age 25 or older, your body mass index should be between 23-30. Your Body mass index is 25.08 kg/m. If this is out of the aforementioned range listed, please consider follow up with your Primary Care Provider.  If you are age 10 or younger, your body mass index should be between 19-25. Your Body mass index is 25.08 kg/m. If this is out of the aformentioned range listed, please consider follow up with your Primary Care Provider.   ________________________________________________________  The [facility] providers would like to encourage you to use MYCHART to communicate with providers for non-urgent requests or questions.  Due to long hold times on the telephone, sending your provider a message by [facility] may be a faster and more efficient way to get a response.  Please allow 48 business hours for a response.  Please remember that this is for non-urgent  requests.  _______________________________________________________

## 2024-03-07 ENCOUNTER — Ambulatory Visit: Payer: Self-pay | Admitting: Adult Health

## 2024-03-07 NOTE — Progress Notes (Signed)
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 -  continue Gabapentin  and Lamictal  -  I see that she was seen at the [facility] on 02/28/24 for neck pain -  Please follow up with [facility]

## 2024-03-14 ENCOUNTER — Encounter: Payer: Self-pay | Admitting: Neurology

## 2024-03-14 ENCOUNTER — Other Ambulatory Visit: Payer: Self-pay | Admitting: Neurology

## 2024-03-19 ENCOUNTER — Ambulatory Visit: Admitting: Neurology

## 2024-03-22 ENCOUNTER — Ambulatory Visit: Admitting: Family

## 2024-03-24 DIAGNOSIS — Z419 Encounter for procedure for purposes other than remedying health state, unspecified: Secondary | ICD-10-CM | POA: Diagnosis not present

## 2024-03-30 ENCOUNTER — Other Ambulatory Visit: Payer: Self-pay

## 2024-03-30 ENCOUNTER — Emergency Department (HOSPITAL_BASED_OUTPATIENT_CLINIC_OR_DEPARTMENT_OTHER)

## 2024-03-30 ENCOUNTER — Emergency Department (HOSPITAL_BASED_OUTPATIENT_CLINIC_OR_DEPARTMENT_OTHER)
Admission: EM | Admit: 2024-03-30 | Discharge: 2024-03-30 | Disposition: A | Discharge status: Home / Self Care | Attending: Emergency Medicine | Admitting: Emergency Medicine

## 2024-03-30 ENCOUNTER — Encounter (HOSPITAL_BASED_OUTPATIENT_CLINIC_OR_DEPARTMENT_OTHER): Payer: Self-pay

## 2024-03-30 DIAGNOSIS — S0990XA Unspecified injury of head, initial encounter: Secondary | ICD-10-CM | POA: Diagnosis present

## 2024-03-30 DIAGNOSIS — W01198A Fall on same level from slipping, tripping and stumbling with subsequent striking against other object, initial encounter: Secondary | ICD-10-CM | POA: Insufficient documentation

## 2024-03-30 DIAGNOSIS — Z23 Encounter for immunization: Secondary | ICD-10-CM | POA: Diagnosis not present

## 2024-03-30 DIAGNOSIS — S0101XA Laceration without foreign body of scalp, initial encounter: Secondary | ICD-10-CM | POA: Diagnosis not present

## 2024-03-30 DIAGNOSIS — E785 Hyperlipidemia, unspecified: Secondary | ICD-10-CM

## 2024-03-30 DIAGNOSIS — Y92009 Unspecified place in unspecified non-institutional (private) residence as the place of occurrence of the external cause: Secondary | ICD-10-CM | POA: Diagnosis not present

## 2024-03-30 MED ORDER — TETANUS-DIPHTH-ACELL PERTUSSIS 5-2.5-18.5 LF-MCG/0.5 IM SUSY
0.5000 mL | PREFILLED_SYRINGE | Freq: Once | INTRAMUSCULAR | Status: AC
  Administered 2024-03-30: 0.5 mL via INTRAMUSCULAR
  Filled 2024-03-30: qty 0.5

## 2024-03-30 NOTE — ED Triage Notes (Signed)
 Pt c/o really bad fall onto concrete- tripped & fell, couldn't catch myself so my head caught the brunt of the fall. Advises she was in PT & advised to come to ED for eval/ CT to r/o head bleed. Denies thinners, neuro exam WDL  Migraine med this AM

## 2024-03-30 NOTE — ED Provider Notes (Signed)
 [place] EMERGENCY DEPARTMENT AT [facility] Provider Note   CSN: [id number] Arrival date & time: 03/30/24  1209     Patient presents with: Fall and Head Injury   Bonnie Hall is a 63 y.o. female.   63 year old female with a past medical history of anxiety, depression presents to the ED status post mechanical fall.  Patient reports she was bringing on her groceries inside the house yesterday when she tripped on the steps and did not brace her fall, does report striking the left side of her head.  There was no loss of consciousness.  She was able to get herself up, they did call EMS who evaluated patient and suggested she be evaluated in the hospital.  Patient reports she did not want to come to the hospital today.  She was receiving PT, and physical therapist did tell her that they would not be able to provide more therapy if she was not evaluated in the emergency department prior to this.  She does have a 3 cm laceration to the left side of her head, this is continued to ooze and bleed since the incident.  She is currently not on any blood thinners, no headache, no nausea, no vomiting.  The history is provided by the patient.  Fall Pertinent negatives include no headaches.  Head Injury Associated symptoms: no headaches        Prior to Admission medications   Medication Sig Start Date End Date Taking? Authorizing Provider  ALPRAZolam (XANAX) 1 MG tablet Take 1 mg by mouth at bedtime as needed.    [provider]  atorvastatin  (LIPITOR) 10 MG tablet Take 1 tablet (10 mg total) by mouth daily. 11/30/23   Ngetich, Dinah C, NP  benzonatate  (TESSALON  PERLES) 100 MG capsule Take 1 capsule (100 mg total) by mouth 3 (three) times daily as needed. 02/09/24 02/08/25  Ngetich, Dinah C, NP  clonazePAM (KLONOPIN) 1 MG tablet Take 0.75 mg by mouth daily. 05/14/23 03/05/24  [provider]  erythromycin  ophthalmic ointment Apply to eye. 06/10/23   [provider]  famotidine  (PEPCID ) 20 MG tablet Take 1 tablet (20 mg total) by mouth 2 (two) times daily. 12/05/23   Ngetich, Dinah C, NP  gabapentin  (NEURONTIN ) 300 MG capsule TAKE 1 CAPSULE BY MOUTH EVERYDAY AT BEDTIME Patient taking differently: Take 300 mg by mouth 3 (three) times daily. 02/13/24   Skeet Juliene SAUNDERS, DO  lamoTRIgine  (LAMICTAL ) 200 MG tablet Take 300 mg by mouth daily.    [provider]  lidocaine  (LIDODERM ) 5 % Place 1 patch onto the skin daily. Remove & Discard patch within 12 hours or as directed by MD 11/17/23   Petrina Pries, NP  montelukast  (SINGULAIR ) 10 MG tablet Take 1 tablet (10 mg total) by mouth at bedtime. 12/05/23   Ngetich, Dinah C, NP  ondansetron  (ZOFRAN -ODT) 4 MG disintegrating tablet Take 1 tablet (4 mg total) by mouth every 8 (eight) hours as needed for nausea or vomiting. DISSOLVE ONE TABLET ON TONGUE EVERY 8 HOURS AS NEEDED FOR NAUSEA OR VOMITING 01/31/24   Ngetich, Dinah C, NP  pantoprazole  (PROTONIX ) 40 MG tablet Take 1 tablet (40 mg total) by mouth 2 (two) times daily. 12/05/23   Ngetich, Dinah C, NP  traZODone (DESYREL) 100 MG tablet Take 200 mg by mouth at bedtime. 06/16/22 03/05/24  [provider]  vortioxetine HBr (TRINTELLIX) 20 MG TABS tablet Take 20 mg by mouth daily.    [provider]  Allergies: Patient has no known allergies.    Review of Systems  Constitutional:  Negative for chills and fever.  Skin:  Positive for wound.  Neurological:  Negative for dizziness and headaches.    Updated Vital Signs BP 113/62 (BP Location: Right Arm)   Pulse 70   Temp 98 F (36.7 C)   Resp 18   LMP 03/12/2012   SpO2 97%   Physical Exam Vitals and nursing note reviewed.  Constitutional:      Appearance: Normal appearance.  HENT:     Head: Normocephalic.      Mouth/Throat:     Mouth: Mucous membranes are moist.  Neck:     Comments: No cervical spine tenderness.  Cardiovascular:     Rate and Rhythm: Normal rate.  Pulmonary:      Effort: Pulmonary effort is normal.  Abdominal:     General: Abdomen is flat.  Musculoskeletal:     Cervical back: Normal range of motion and neck supple.  Skin:    General: Skin is warm and dry.  Neurological:     Mental Status: She is alert and oriented to person, place, and time.     Comments: Alert, oriented, thought content appropriate. Speech fluent without evidence of aphasia. Able to follow 2 step commands without difficulty.  Cranial Nerves:  II:  Peripheral visual fields grossly normal, pupils, round, reactive to light III,IV, VI: ptosis not present, extra-ocular motions intact bilaterally  V,VII: smile symmetric, facial light touch sensation equal VIII: hearing grossly normal bilaterally  IX,X: midline uvula rise  XI: bilateral shoulder shrug equal and strong XII: midline tongue extension  Motor:  5/5 in upper and lower extremities bilaterally including strong and equal grip strength and dorsiflexion/plantar flexion Sensory: light touch normal in all extremities.  Cerebellar: normal finger-to-nose with bilateral upper extremities, pronator drift negative Gait: normal gait and balance       (all labs ordered are listed, but only abnormal results are displayed) Labs Reviewed - No data to display  EKG: None  Radiology: CT Head Wo Contrast Result Date: 03/30/2024 CLINICAL DATA:  63 year old female status post fall onto concrete. EXAM: CT HEAD WITHOUT CONTRAST TECHNIQUE: Contiguous axial images were obtained from the base of the skull through the vertex without intravenous contrast. RADIATION DOSE REDUCTION: This exam was performed according to the departmental dose-optimization program which includes automated exposure control, adjustment of the mA and/or kV according to patient size and/or use of iterative reconstruction technique. COMPARISON:  Brain MRI 01/17/2021 and head CT 01/28/2021. FINDINGS: Brain: Cerebral volume is within normal limits for age. No midline shift,  ventriculomegaly, mass effect, evidence of mass lesion, intracranial hemorrhage or evidence of cortically based acute infarction. Gray-white matter differentiation is within normal limits throughout the brain. Vascular: No suspicious intracranial vascular hyperdensity. Skull: Appears stable and intact. No acute osseous abnormality identified. Sinuses/Orbits: Clear aside from chronic trace layering fluid in the left sphenoid unchanged from 2022. Other: No acute orbit or scalp soft tissue injury identified. IMPRESSION: No acute traumatic injury identified. Normal for age noncontrast CT appearance of the brain. Electronically Signed   By: VEAR Hurst M.D.   On: 03/30/2024 12:55     .Laceration Repair  Date/Time: 03/30/2024 2:57 PM  Performed by: Ambrose Wile, PA-C Authorized by: Shantina Chronister, PA-C   Consent:    Consent obtained:  Verbal   Consent given by:  Patient   Risks discussed:  Infection and pain   Alternatives discussed:  No treatment Universal protocol:  Patient identity confirmed:  Verbally with patient Anesthesia:    Anesthesia method:  None Laceration details:    Location:  Scalp   Scalp location:  Crown   Length (cm):  3   Depth (mm):  1 Skin repair:    Repair method:  Staples   Number of staples:  3 Approximation:    Approximation:  Close Repair type:    Repair type:  Simple Post-procedure details:    Dressing:  Open (no dressing)   Procedure completion:  Tolerated well, no immediate complications    Medications Ordered in the ED  Tdap (BOOSTRIX ) injection 0.5 mL (has no administration in time range)                                    Medical Decision Making Amount and/or Complexity of Data Reviewed Radiology: ordered.  Risk Prescription drug management.   Patient presents to the ED status post mechanical fall which occurred approximately around 7 PM last night, states that she could not brace her fall and struck the left side of her head on concrete.  Not  on any blood thinners, no loss of consciousness, no headaches noted.  She is here as physical therapy refused to continue her visitations unless she was evaluated due to this fall.  Her vitals are within normal limits, she is neurologically intact.  CT scan of her head did not show any intracranial hemorrhage.  I discussed risks and benefits of repairing the laceration to her scalp, I do feel that even though this is past the timeframe in which we would close it due to the risk of infection, I do not feel that this wound would heal in a timely manner.   I discussed case with attending Dr. Dreama, who agrees with wound repair at this time. Patient tolerated this well, after placement of 3 stiches. Stable for discharge.    Portions of this note were generated with Scientist, clinical (histocompatibility and immunogenetics). Dictation errors may occur despite best attempts at proofreading.     Final diagnoses:  Injury of head, initial encounter  Laceration of scalp without foreign body, initial encounter    ED Discharge Orders     None          Maureen Broad, PA-C 03/30/24 1500    Dreama Longs, MD 03/31/24 [phone]

## 2024-03-30 NOTE — ED Notes (Signed)
Reviewed discharge instructions and home care with pt. Pt verbalized understanding and had no further questions. Pt exited ED without complications.

## 2024-03-30 NOTE — Discharge Instructions (Addendum)
 You had 3 staples placed to your head, please have these removed within 7-10 days.  Your Head CT was negative on todays visit.

## 2024-04-02 ENCOUNTER — Other Ambulatory Visit

## 2024-04-02 LAB — LIPID PANEL
Cholesterol: 154 mg/dL (ref <200)
HDL: 69 mg/dL (ref >=50)
LDL Cholesterol (Calc): 70 mg/dL
Non-HDL Cholesterol (Calc): 85 mg/dL (ref <130)
Total CHOL/HDL Ratio: 2.2 (calc) (ref <5.0)
Triglycerides: 72 mg/dL (ref <150)

## 2024-04-03 ENCOUNTER — Telehealth: Payer: Self-pay

## 2024-04-03 NOTE — Telephone Encounter (Signed)
 Spoke with patient, patient aware that as long as the area is healed we will remove

## 2024-04-03 NOTE — Telephone Encounter (Signed)
 Copied from CRM [id number]. Topic: General - Other >> Apr 03, 2024  2:11 PM Miquel SAILOR wrote: Reason for CRM: Patient called to confirm on 07/24 if she can get her staples removed from  head due to head wound on 07/18 went to ER. Called and confirmed added  to notes

## 2024-04-04 ENCOUNTER — Ambulatory Visit: Payer: Self-pay | Admitting: Family

## 2024-04-05 ENCOUNTER — Ambulatory Visit: Admitting: Family

## 2024-04-05 ENCOUNTER — Encounter: Payer: Self-pay | Admitting: Family

## 2024-04-05 VITALS — BP 110/68 | HR 76 | Temp 97.8°F | Resp 19 | Ht 63.5 in | Wt 140.8 lb

## 2024-04-05 DIAGNOSIS — R1084 Generalized abdominal pain: Secondary | ICD-10-CM | POA: Diagnosis not present

## 2024-04-05 DIAGNOSIS — R42 Dizziness and giddiness: Secondary | ICD-10-CM | POA: Diagnosis not present

## 2024-04-05 DIAGNOSIS — Z4802 Encounter for removal of sutures: Secondary | ICD-10-CM | POA: Diagnosis not present

## 2024-04-05 DIAGNOSIS — M47812 Spondylosis without myelopathy or radiculopathy, cervical region: Secondary | ICD-10-CM

## 2024-04-05 NOTE — Patient Instructions (Signed)
-   Notify provider for any signs of infection such as redness,drainage or fever

## 2024-04-10 ENCOUNTER — Ambulatory Visit: Admitting: Neurology

## 2024-04-13 NOTE — Progress Notes (Signed)
 Provider: Roxan Plough FNP-C   Richey Doolittle, Roxan BROCKS, NP  Patient Care Team: Jatorian Renault, Roxan BROCKS, NP as PCP - General (Family Medicine) Mat Browning, MD as Attending Physician (Obstetrics and Gynecology) Johnetta Ralene NOVAK, NP as Nurse Practitioner (Psychiatry)  Extended Emergency Contact Information Primary Emergency Contact: Thiemann,Richard Address: [address]          [place], [place] [address] [place] Work Phone: [phone] Mobile Phone: [phone] Relation: Spouse  Code Status:  Full Code  Goals of care: Advanced Directive information    04/05/2024    1:18 PM  Advanced Directives  Does Patient Have a Medical Advance Directive? No  Would patient like information on creating a medical advance directive? No - Patient declined     Chief Complaint  Patient presents with   Suture / Staple Removal    Check up     Discussed the use of AI scribe software for clinical note transcription with the patient, who gave verbal consent to proceed.  History of Present Illness   Bonnie Hall is a 63 year old female who presents with persistent neck and abdominal pain, dizziness, and headaches.  She experienced a fall on July 18th, tripping on the steps to her kitchen and hitting her head on bricks, resulting in a scalp laceration. She did not seek immediate medical attention but went to the hospital the following day. She reports that the hospital performed a CT scan and told her that intracranial hemorrhage and concussion were ruled out. She received a tetanus shot and had three staples placed in her scalp, which were removed after seven days without complications.  She has severe neck pain, described as preventing her from bending her head side to side or front to back. She reports having an MRI of her neck but states she did not receive the results. The neck pain is severe and radiates around her sides to her hips.  She reports severe abdominal pain,  causing her to double over in pain at times. Previous evaluations, including a colonoscopy and endoscopy, found no abnormalities.  She experiences dizziness, balance problems, jumbled speech, and headaches that are not relieved by prescribed migraine medications or over-the-counter treatments. She has been prescribed gabapentin  three times a day, but it is not alleviating her symptoms.  She mentions a history of spine pain and has been evaluated by multiple specialists, including a neurologist and a spine doctor, but feels her symptoms are not being addressed comprehensively. She expresses concern that her symptoms may be interconnected and not solely due to aging.  No blood thinners or aspirin  use. No issues with bowel movements, stating she moves her bowels about seven times a day without diarrhea or bloating. Her weight has decreased slightly from 145 to 140.8 pounds.    Past Medical History:  Diagnosis Date   Allergy    Hay fever/exercise induced asthma   AMA (advanced maternal age) multigravida 35+    Anxiety    on meds   Arthritis    generalized   Asthma    Blood transfusion 1990's   Colitis 10/23/2023   Depression    bipolar=on meds   GERD (gastroesophageal reflux disease)    on meds   Hyperlipidemia    on meds   Iron deficiency anemia 1990's   negative work up as to etiology; required iron infusion, blood transfusion.  Saw an hematologist  at [facility].    Left-sided Bell's palsy 02/03/2021   Migraine    Newborn  product of in vitro fertilization (IVF) pregnancy    Pregnancy induced hypertension    on meds   Preterm labor    Sleep apnea    no CPAP   Thrombocytosis    Vaginal Pap smear, abnormal    Past Surgical History:  Procedure Laterality Date   CESAREAN SECTION  2009   CESAREAN SECTION N/A 03/09/2017   Procedure: REPEAT CESAREAN SECTION;  Surgeon: Mat Browning, MD;  Location: [facility] BIRTHING SU[facility];  Service: Obstetrics;  Laterality: N/A;   COLONOSCOPY  06/2012    Dr.Kaplan-MAC-movi(exc)-normal-10 yr recall   GYNECOLOGIC CRYOSURGERY     NASAL SEPTUM SURGERY  1983   TONSILLECTOMY  1966    No Known Allergies  Allergies as of 04/05/2024   No Known Allergies      Medication List        Accurate as of April 05, 2024 11:59 PM. If you have any questions, ask your nurse or doctor.          ALPRAZolam 1 MG tablet Commonly known as: XANAX Take 1 mg by mouth at bedtime as needed.   atorvastatin  10 MG tablet Commonly known as: LIPITOR Take 1 tablet (10 mg total) by mouth daily.   benzonatate  100 MG capsule Commonly known as: Tessalon  Perles Take 1 capsule (100 mg total) by mouth 3 (three) times daily as needed.   clonazePAM 1 MG tablet Commonly known as: KLONOPIN Take 0.75 mg by mouth daily.   erythromycin  ophthalmic ointment Apply to eye.   famotidine  20 MG tablet Commonly known as: PEPCID  Take 1 tablet (20 mg total) by mouth 2 (two) times daily.   gabapentin  300 MG capsule Commonly known as: NEURONTIN  TAKE 1 CAPSULE BY MOUTH EVERYDAY AT BEDTIME What changed: See the new instructions.   lamoTRIgine  200 MG tablet Commonly known as: LAMICTAL  Take 300 mg by mouth daily.   lidocaine  5 % Commonly known as: Lidoderm  Place 1 patch onto the skin daily. Remove & Discard patch within 12 hours or as directed by MD   montelukast  10 MG tablet Commonly known as: SINGULAIR  Take 1 tablet (10 mg total) by mouth at bedtime.   ondansetron  4 MG disintegrating tablet Commonly known as: ZOFRAN -ODT Take 1 tablet (4 mg total) by mouth every 8 (eight) hours as needed for nausea or vomiting. DISSOLVE ONE TABLET ON TONGUE EVERY 8 HOURS AS NEEDED FOR NAUSEA OR VOMITING   pantoprazole  40 MG tablet Commonly known as: PROTONIX  Take 1 tablet (40 mg total) by mouth 2 (two) times daily.   traZODone 100 MG tablet Commonly known as: DESYREL Take 200 mg by mouth at bedtime.   vortioxetine HBr 20 MG Tabs tablet Commonly known as: TRINTELLIX Take 20  mg by mouth daily.        Review of Systems  Constitutional:  Negative for appetite change, chills, fatigue, fever and unexpected weight change.  HENT:  Negative for congestion, dental problem, ear discharge, ear pain, facial swelling, hearing loss, nosebleeds, postnasal drip, rhinorrhea, sinus pressure, sinus pain, sneezing, sore throat, tinnitus and trouble swallowing.   Eyes:  Negative for pain, discharge, redness, itching and visual disturbance.  Respiratory:  Negative for cough, chest tightness, shortness of breath and wheezing.   Cardiovascular:  Negative for chest pain, palpitations and leg swelling.  Gastrointestinal:  Negative for abdominal distention, abdominal pain, blood in stool, constipation, diarrhea, nausea and vomiting.  Endocrine: Negative for cold intolerance, heat intolerance, polydipsia, polyphagia and polyuria.  Genitourinary:  Negative for difficulty urinating, dysuria, flank pain,  frequency and urgency.  Musculoskeletal:  Negative for arthralgias, back pain, gait problem, joint swelling, myalgias, neck pain and neck stiffness.  Skin:  Negative for color change, pallor, rash and wound.  Neurological:  Negative for dizziness, syncope, speech difficulty, weakness, light-headedness, numbness and headaches.  Hematological:  Does not bruise/bleed easily.  Psychiatric/Behavioral:  Negative for agitation, behavioral problems, confusion, hallucinations, self-injury, sleep disturbance and suicidal ideas. The patient is not nervous/anxious.     Immunization History  Administered Date(s) Administered   Influenza Inj Mdck Quad With Preservative 07/13/2018   Influenza Split 07/10/2013, 07/04/2014, 06/17/2015   Influenza,inj,Quad PF,6+ Mos 09/28/2022   Influenza-Unspecified 08/01/2021   MMR 01/16/2024   PFIZER(Purple Top)SARS-COV-2 Vaccination 12/07/2019, 12/22/2019, 03/30/2021, 11/20/2023   PNEUMOCOCCAL CONJUGATE-20 11/28/2023   PPD Test 01/14/2014, 01/16/2015   Pneumococcal  Polysaccharide-23 12/31/2011   Respiratory Syncytial Virus Vaccine,Recomb Aduvanted(Arexvy) 01/16/2024   Tdap 12/28/2010, 09/13/2016, 03/30/2024   Pertinent  Health Maintenance Due  Topic Date Due   INFLUENZA VACCINE  04/13/2024   MAMMOGRAM  10/15/2024   DEXA SCAN  11/18/2027   Colonoscopy  02/15/2031      01/05/2024    9:04 AM 01/31/2024    1:06 PM 02/28/2024   10:31 AM 03/01/2024   10:59 AM 04/05/2024    1:17 PM  Fall Risk  Falls in the past year? 0 0 0 1 1  Was there an injury with Fall? 0 0 0 0 1  Fall Risk Category Calculator 0 0 0 2 3  Patient at Risk for Falls Due to  No Fall Risks   Impaired balance/gait  Fall risk Follow up Falls evaluation completed Falls evaluation completed  Falls evaluation completed Falls evaluation completed   Functional Status Survey:    Vitals:   04/05/24 1321  BP: 110/68  Pulse: 76  Resp: 19  Temp: 97.8 F (36.6 C)  SpO2: 96%  Weight: 140 lb 12.8 oz (63.9 kg)  Height: 5' 3.5 (1.613 m)   Body mass index is 24.55 kg/m. Physical Exam MEASUREMENTS: Weight- 140.8. GENERAL: Alert, cooperative, well developed, no acute distress. HEENT: Normocephalic, normal oropharynx, moist mucous membranes. NECK: Supple, no nuchal rigidity. CHEST: Clear to auscultation bilaterally, no wheezes, rhonchi, or crackles. CARDIOVASCULAR: Normal heart rate and rhythm, S1 and S2 normal without murmurs. ABDOMEN: Soft, non-tender, non-distended, without organomegaly, normal bowel sounds. EXTREMITIES: No cyanosis or edema. NEUROLOGICAL: Cranial nerves grossly intact, moves all extremities without gross motor or sensory deficit. SKIN: Scalp wound healed, no bleeding.   Labs reviewed: Recent Labs    10/23/23 1715 11/29/23 0816 12/22/23 0951  NA 131* 138 139  K 3.5 4.8 4.2  CL 97* 101 102  CO2 25 30 31   GLUCOSE 108* 94 90  BUN 19 16 15   CREATININE 0.89 0.77 0.81  CALCIUM  9.1 9.4 9.4   Recent Labs    10/23/23 1715 11/29/23 0816 12/22/23 0951  01/04/24 0811  AST 14* 11 11 12   ALT 13 8 8 9   ALKPHOS 46  --  53  --   BILITOT 0.7 0.6 0.5 0.5  PROT 7.0 6.7 6.8 6.4  ALBUMIN 4.0  --  4.5  --    Recent Labs    11/29/23 0816 12/22/23 0951 01/04/24 0811  WBC 7.8 5.2 6.5  NEUTROABS 5,780 3.2 4,414  HGB 13.2 13.2 13.0  HCT 40.0 40.0 40.1  MCV 95.7 96.9 95.9  PLT 431* 389.0 409*   Lab Results  Component Value Date   TSH 0.88 11/29/2023   Lab Results  Component Value Date   HGBA1C 5.3 03/29/2023   Lab Results  Component Value Date   CHOL 154 04/02/2024   HDL 69 04/02/2024   LDLCALC 70 04/02/2024   TRIG 72 04/02/2024   CHOLHDL 2.2 04/02/2024    Significant Diagnostic Results in last 30 days:  CT Head Wo Contrast Result Date: 03/30/2024 CLINICAL DATA:  63 year old female status post fall onto concrete. EXAM: CT HEAD WITHOUT CONTRAST TECHNIQUE: Contiguous axial images were obtained from the base of the skull through the vertex without intravenous contrast. RADIATION DOSE REDUCTION: This exam was performed according to the departmental dose-optimization program which includes automated exposure control, adjustment of the mA and/or kV according to patient size and/or use of iterative reconstruction technique. COMPARISON:  Brain MRI 01/17/2021 and head CT 01/28/2021. FINDINGS: Brain: Cerebral volume is within normal limits for age. No midline shift, ventriculomegaly, mass effect, evidence of mass lesion, intracranial hemorrhage or evidence of cortically based acute infarction. Gray-white matter differentiation is within normal limits throughout the brain. Vascular: No suspicious intracranial vascular hyperdensity. Skull: Appears stable and intact. No acute osseous abnormality identified. Sinuses/Orbits: Clear aside from chronic trace layering fluid in the left sphenoid unchanged from 2022. Other: No acute orbit or scalp soft tissue injury identified. IMPRESSION: No acute traumatic injury identified. Normal for age noncontrast CT  appearance of the brain. Electronically Signed   By: VEAR Hurst M.D.   On: 03/30/2024 12:55    Assessment/Plan  Cervicogenic dizziness Symptoms include jumbled speech, balance problems, neck pain, and headaches. Evaluated by multiple specialists without a definitive diagnosis. She researched cervicogenic dizziness and believes it matches her symptoms. - Review all specialist reports and imaging studies - Consider referral to a neurologist or spine specialist for further evaluation  Degenerative cervical spondylosis MRI indicates degenerative spondylosis with mild stenosis at C5-C6 and C6-C7. Experiences severe neck pain, potentially related to other symptoms. - Refer to [facility] for evaluation  Abdominal pain Severe abdominal pain persists despite previous evaluations, including a colonoscopy and endoscopy showing no abnormalities. Plans to switch to a more compassionate gastroenterologist. - Facilitate switch to Dr. Marysue for gastroenterological evaluation - Review gastroenterologist's notes for further insights  Scalp laceration Sustained a scalp laceration after tripping and hitting her head on bricks. Treated with three staples and a tetanus shot. CT scan ruled out intracranial hemorrhage and concussion. Staples removed, wound healed without infection. - Advise gentle hair washing for the next 48 hours   Family/ staff Communication: Reviewed plan of care with patient verbalized understanding   Labs/tests ordered: None   Next Appointment : Return if symptoms worsen or fail to improve.   Spent 30 minutes of Face to face and non-face to face with patient  >50% time spent counseling; reviewing medical record; tests; labs; documentation and developing future plan of care.   Roxan JAYSON Plough, NP

## 2024-04-17 ENCOUNTER — Telehealth: Payer: Self-pay

## 2024-04-17 NOTE — Telephone Encounter (Signed)
 Who ordered the therapy- would wait until her first session to see if she needs any additional orders

## 2024-04-17 NOTE — Telephone Encounter (Signed)
 It is in the note- CT scan of her head did not show any intracranial hemorrhage.

## 2024-04-17 NOTE — Telephone Encounter (Signed)
 Copied from CRM [id number]. Topic: Referral - Question >> Apr 17, 2024  2:04 PM Alfonso ORN wrote: Reason for CRM: patient already have a referral for physcial therapy and need to add additional therapy for patient 's neck , reason , due to the MRI results patient can not move her head side to side or back and forth Patient currently at [facility] ortho    ----------------------------------------------------------------------- From previous Reason for Contact - Referral Request: Did the patient discuss referral with their provider in the last year?   (If No - schedule appointment) (If Yes - send message)  Appointment offered?    Type of order/referral and detailed reason for visit:   Preference of office, provider, location:   If referral order, have you been seen by this specialty before?   (If Yes, this issue or another issue? When? Where?  Can we respond through MyChart?

## 2024-04-17 NOTE — Telephone Encounter (Signed)
 Copied from CRM [id number]. Topic: Clinical - Medical Advice >> Apr 17, 2024  2:07 PM Alfonso ORN wrote: Reason for CRM: patient had staples remove last thursday by Roxan Plough. the hospital verbally stated patient do not have a brain bleed or concussion from the CT , but did not put on the after summary paper , However patients physical Therapist   need a note stated that patient do not have a brain bleed or concussion need a note soon as possible before her upcoming appointment with her therapist Thursday 04/19/24 The therapist will not see patient until they have note stating patient do not have a brain bleed or concussion    Please call patient at  to let know [phone]

## 2024-04-18 NOTE — Telephone Encounter (Signed)
 Pt called and stated that her therapist is not accepting the notes in the CT scan as a clearance. They need an actual letter clearing the pt from the doctor. Pt is asking if this letter can be printed today before 1:30p. Please contact her at [phone].

## 2024-04-18 NOTE — Telephone Encounter (Signed)
 PT clearance letter written and printed.will give to Adm staff at the front desk for patient to pickup.

## 2024-04-24 DIAGNOSIS — Z419 Encounter for procedure for purposes other than remedying health state, unspecified: Secondary | ICD-10-CM | POA: Diagnosis not present

## 2024-04-25 ENCOUNTER — Ambulatory Visit: Admitting: Neurology

## 2024-04-26 ENCOUNTER — Encounter: Payer: Self-pay | Admitting: Family

## 2024-04-26 ENCOUNTER — Ambulatory Visit (INDEPENDENT_AMBULATORY_CARE_PROVIDER_SITE_OTHER): Admitting: Family

## 2024-04-26 VITALS — BP 114/68 | HR 72 | Temp 97.7°F | Resp 20 | Ht 63.5 in | Wt 139.8 lb

## 2024-04-26 DIAGNOSIS — M542 Cervicalgia: Secondary | ICD-10-CM

## 2024-04-26 DIAGNOSIS — M549 Dorsalgia, unspecified: Secondary | ICD-10-CM

## 2024-04-26 DIAGNOSIS — R051 Acute cough: Secondary | ICD-10-CM

## 2024-04-26 DIAGNOSIS — R2681 Unsteadiness on feet: Secondary | ICD-10-CM | POA: Diagnosis not present

## 2024-04-26 MED ORDER — BENZONATATE 100 MG PO CAPS: 100.0000 mg | ORAL_CAPSULE | Freq: Three times a day (TID) | ORAL | 0 refills | Status: AC | PRN

## 2024-04-26 NOTE — Progress Notes (Signed)
 Provider: Roxan Plough FNP-C  Ronson Hagins, Roxan BROCKS, NP  Patient Care Team: Elaura Calix, Roxan BROCKS, NP as PCP - General (Family Medicine) Mat Browning, MD as Attending Physician (Obstetrics and Gynecology) Johnetta Ralene NOVAK, NP as Nurse Practitioner (Psychiatry)  Extended Emergency Contact Information Primary Emergency Contact: Sanguinetti,Richard Address: [address]          [place], [place] [address] [place] Work Phone: [phone] Mobile Phone: [phone] Relation: Spouse  Code Status:  Full Code  Goals of care: Advanced Directive information    04/05/2024    1:18 PM  Advanced Directives  Does Patient Have a Medical Advance Directive? No  Would patient like information on creating a medical advance directive? No - Patient declined     Chief Complaint  Patient presents with   Referral    Discuss ortho referral.    Discussed the use of AI scribe software for clinical note transcription with the patient, who gave verbal consent to proceed.  History of Present Illness   Bonnie Hall is a 63 year old female who presents with neck pain and balance issues.  She experiences ongoing neck pain with an inability to move her neck side to side and front to back without pain. An MRI has been previously performed for her neck. She is seeking a referral for physical therapy to address her neck pain.  She is experiencing balance issues, which are not associated with dizziness. She is already attending physical therapy for her lower back and would like to address her balance issues during these sessions.  She reports pain in her upper back, specifically between the shoulder blades, and in her lower back extending down to her hips. She is currently undergoing physical therapy for her lower back but notes that her upper back pain was not included in the referral.  She is taking gabapentin  300 mg three times a day, ondansetron , and benzonatate , and requests  refills for these medications. She reports numbness in her big toe.  She mentions ongoing stomach pain and plans to discuss this with her gastroenterologist, as she disagrees with the previous assessment of constipation.   Past Medical History:  Diagnosis Date   Allergy    Hay fever/exercise induced asthma   AMA (advanced maternal age) multigravida 35+    Anxiety    on meds   Arthritis    generalized   Asthma    Blood transfusion 1990's   Colitis 10/23/2023   Depression    bipolar=on meds   GERD (gastroesophageal reflux disease)    on meds   Hyperlipidemia    on meds   Iron deficiency anemia 1990's   negative work up as to etiology; required iron infusion, blood transfusion.  S[facility] an hematologist  at [facility].    Left-sided Bell's palsy 02/03/2021   Migraine    Newborn product of in vitro fertilization (IVF) pregnancy    Pregnancy induced hypertension    on meds   Preterm labor    Sleep apnea    no CPAP   Thrombocytosis    Vaginal Pap smear, abnormal    Past Surgical History:  Procedure Laterality Date   CESAREAN SECTION  2009   CESAREAN SECTION N/A 03/09/2017   Procedure: REPEAT CESAREAN SECTION;  Surgeon: Mat Browning, MD;  Location: [facility] BIRTHING SU[facility];  Service: Obstetrics;  Laterality: N/A;   COLONOSCOPY  06/2012   Dr.Kaplan-MAC-movi(exc)-normal-10 yr recall   GYNECOLOGIC CRYOSURGERY     NASAL SEPTUM SURGERY  [phone]  TONSILLECTOMY  1966    No Known Allergies  Outpatient Encounter Medications as of 04/26/2024  Medication Sig   atorvastatin  (LIPITOR) 10 MG tablet Take 1 tablet (10 mg total) by mouth daily.   erythromycin  ophthalmic ointment Apply to eye.   famotidine  (PEPCID ) 20 MG tablet Take 1 tablet (20 mg total) by mouth 2 (two) times daily.   gabapentin  (NEURONTIN ) 300 MG capsule TAKE 1 CAPSULE BY MOUTH EVERYDAY AT BEDTIME (Patient taking differently: 300 mg 3 (three) times daily.)   lamoTRIgine  (LAMICTAL ) 200 MG tablet Take 300 mg by mouth daily.    lidocaine  (LIDODERM ) 5 % Place 1 patch onto the skin daily. Remove & Discard patch within 12 hours or as directed by MD   montelukast  (SINGULAIR ) 10 MG tablet Take 1 tablet (10 mg total) by mouth at bedtime.   pantoprazole  (PROTONIX ) 40 MG tablet Take 1 tablet (40 mg total) by mouth 2 (two) times daily.   traZODone (DESYREL) 100 MG tablet Take 200 mg by mouth at bedtime.   vortioxetine HBr (TRINTELLIX) 20 MG TABS tablet Take 20 mg by mouth daily.   ALPRAZolam (XANAX) 1 MG tablet Take 1 mg by mouth at bedtime as needed.   benzonatate  (TESSALON  PERLES) 100 MG capsule Take 1 capsule (100 mg total) by mouth 3 (three) times daily as needed.   ondansetron  (ZOFRAN -ODT) 4 MG disintegrating tablet Take 1 tablet (4 mg total) by mouth every 8 (eight) hours as needed for nausea or vomiting. DISSOLVE ONE TABLET ON TONGUE EVERY 8 HOURS AS NEEDED FOR NAUSEA OR VOMITING   [DISCONTINUED] benzonatate  (TESSALON  PERLES) 100 MG capsule Take 1 capsule (100 mg total) by mouth 3 (three) times daily as needed.   No facility-administered encounter medications on file as of 04/26/2024.    Review of Systems  Immunization History  Administered Date(s) Administered   Influenza Inj Mdck Quad With Preservative 07/13/2018   Influenza Split 07/10/2013, 07/04/2014, 06/17/2015   Influenza,inj,Quad PF,6+ Mos 09/28/2022   Influenza-Unspecified 08/01/2021   MMR 01/16/2024   PFIZER(Purple Top)SARS-COV-2 Vaccination 12/07/2019, 12/22/2019, 03/30/2021, 11/20/2023   PNEUMOCOCCAL CONJUGATE-20 11/28/2023   PPD Test 01/14/2014, 01/16/2015   Pneumococcal Polysaccharide-23 12/31/2011   Respiratory Syncytial Virus Vaccine,Recomb Aduvanted(Arexvy) 01/16/2024   Tdap 12/28/2010, 09/13/2016, 03/30/2024   Pertinent  Health Maintenance Due  Topic Date Due   INFLUENZA VACCINE  04/13/2024   MAMMOGRAM  10/15/2024   DEXA SCAN  11/18/2027   Colonoscopy  02/15/2031      01/05/2024    9:04 AM 01/31/2024    1:06 PM 02/28/2024   10:31 AM  03/01/2024   10:59 AM 04/05/2024    1:17 PM  Fall Risk  Falls in the past year? 0 0 0 1 1  Was there an injury with Fall? 0 0 0 0 1  Fall Risk Category Calculator 0 0 0 2 3  Patient at Risk for Falls Due to  No Fall Risks   Impaired balance/gait  Fall risk Follow up Falls evaluation completed Falls evaluation completed  Falls evaluation completed Falls evaluation completed   Functional Status Survey:    Vitals:   04/26/24 1332  BP: 114/68  Pulse: 72  Resp: 20  Temp: 97.7 F (36.5 C)  SpO2: 98%  Weight: 139 lb 12.8 oz (63.4 kg)  Height: 5' 3.5 (1.613 m)   Body mass index is 24.38 kg/m. Physical Exam  GENERAL: Alert, cooperative, well developed, no acute distress. HEENT: Normocephalic, normal oropharynx, moist mucous membranes. NECK: Limited range of motion with pain on  movement. CHEST: Clear to auscultation bilaterally, no wheezes, rhonchi, or crackles. CARDIOVASCULAR: Normal heart rate and rhythm, S1 and S2 normal without murmurs. ABDOMEN: Soft, non-tender, non-distended, without organomegaly, normal bowel sounds. EXTREMITIES: No cyanosis or edema. MUSCULOSKELETAL: Pain in upper back between shoulder blades without tenderness on palpation. Pain in lower back extending to hips. NEUROLOGICAL: Cranial nerves grossly intact, moves all extremities without gross motor or sensory deficit. SKIN: Head wound completely healed, no redness.   Labs reviewed: Recent Labs    10/23/23 1715 11/29/23 0816 12/22/23 0951  NA 131* 138 139  K 3.5 4.8 4.2  CL 97* 101 102  CO2 25 30 31   GLUCOSE 108* 94 90  BUN 19 16 15   CREATININE 0.89 0.77 0.81  CALCIUM  9.1 9.4 9.4   Recent Labs    10/23/23 1715 11/29/23 0816 12/22/23 0951 01/04/24 0811  AST 14* 11 11 12   ALT 13 8 8 9   ALKPHOS 46  --  53  --   BILITOT 0.7 0.6 0.5 0.5  PROT 7.0 6.7 6.8 6.4  ALBUMIN 4.0  --  4.5  --    Recent Labs    11/29/23 0816 12/22/23 0951 01/04/24 0811  WBC 7.8 5.2 6.5  NEUTROABS 5,780 3.2 4,414   HGB 13.2 13.2 13.0  HCT 40.0 40.0 40.1  MCV 95.7 96.9 95.9  PLT 431* 389.0 409*   Lab Results  Component Value Date   TSH 0.88 11/29/2023   Lab Results  Component Value Date   HGBA1C 5.3 03/29/2023   Lab Results  Component Value Date   CHOL 154 04/02/2024   HDL 69 04/02/2024   LDLCALC 70 04/02/2024   TRIG 72 04/02/2024   CHOLHDL 2.2 04/02/2024    Significant Diagnostic Results in last 30 days:  CT Head Wo Contrast Result Date: 03/30/2024 CLINICAL DATA:  63 year old female status post fall onto concrete. EXAM: CT HEAD WITHOUT CONTRAST TECHNIQUE: Contiguous axial images were obtained from the base of the skull through the vertex without intravenous contrast. RADIATION DOSE REDUCTION: This exam was performed according to the departmental dose-optimization program which includes automated exposure control, adjustment of the mA and/or kV according to patient size and/or use of iterative reconstruction technique. COMPARISON:  Brain MRI 01/17/2021 and head CT 01/28/2021. FINDINGS: Brain: Cerebral volume is within normal limits for age. No midline shift, ventriculomegaly, mass effect, evidence of mass lesion, intracranial hemorrhage or evidence of cortically based acute infarction. Gray-white matter differentiation is within normal limits throughout the brain. Vascular: No suspicious intracranial vascular hyperdensity. Skull: Appears stable and intact. No acute osseous abnormality identified. Sinuses/Orbits: Clear aside from chronic trace layering fluid in the left sphenoid unchanged from 2022. Other: No acute orbit or scalp soft tissue injury identified. IMPRESSION: No acute traumatic injury identified. Normal for age noncontrast CT appearance of the brain. Electronically Signed   By: VEAR Hurst M.D.   On: 03/30/2024 12:55    Assessment/Plan  Neck pain with limited range of motion Chronic neck pain with limited range of motion, persistent and unchanged. Pain is present with movement in all  directions. - Refer to [facility] for neck pain management.  Upper back pain (thoracic spine) Chronic upper back pain located between the shoulder blades, persistent and unchanged. No tenderness upon examination. - Refer to [facility] for upper back pain management.  Chronic lower back pain with lower extremity numbness Chronic lower back pain extending to the hips, with associated numbness in the big toe, likely due  to nerve involvement from the back. - Refer to [facility] for lower back pain management.  Balance impairment Ongoing balance issues without associated dizziness. - Refer to [facility] for balance therapy.  Medication management She inquired about medication refills and pharmacy coordination for ondansetron , gabapentin , and benzonatate . - Confirm ondansetron  refill with [facility] by Mail. - Coordinate gabapentin  refill with neurosurgery for Meds by Mail. - Refill benzonatate  with refills for Meds by Mail.  Healed scalp laceration, post-staple removal Scalp laceration site is completely healed with no signs of infection or redness.  Family/ staff Communication: Reviewed plan of care with patient verbalized understanding  Labs/tests ordered: None   Next Appointment: Return if symptoms worsen or fail to improve.   Total time: 20 minutes. Greater than 50% of total time spent doing patient education regarding balance impairment,Neck pain chronic back pain,health maintenance including symptom/medication management.   Roxan JAYSON Plough, NP

## 2024-05-03 ENCOUNTER — Ambulatory Visit (INDEPENDENT_AMBULATORY_CARE_PROVIDER_SITE_OTHER): Admitting: Nurse Practitioner

## 2024-05-03 ENCOUNTER — Encounter: Payer: Self-pay | Admitting: Nurse Practitioner

## 2024-05-03 ENCOUNTER — Other Ambulatory Visit

## 2024-05-03 VITALS — BP 114/70 | HR 75 | Ht 63.5 in | Wt 140.2 lb

## 2024-05-03 DIAGNOSIS — G8929 Other chronic pain: Secondary | ICD-10-CM | POA: Diagnosis not present

## 2024-05-03 DIAGNOSIS — R1084 Generalized abdominal pain: Secondary | ICD-10-CM

## 2024-05-03 DIAGNOSIS — R109 Unspecified abdominal pain: Secondary | ICD-10-CM

## 2024-05-03 DIAGNOSIS — K219 Gastro-esophageal reflux disease without esophagitis: Secondary | ICD-10-CM | POA: Diagnosis not present

## 2024-05-03 DIAGNOSIS — K2950 Unspecified chronic gastritis without bleeding: Secondary | ICD-10-CM

## 2024-05-03 DIAGNOSIS — R0989 Other specified symptoms and signs involving the circulatory and respiratory systems: Secondary | ICD-10-CM | POA: Diagnosis not present

## 2024-05-03 DIAGNOSIS — Z860101 Personal history of adenomatous and serrated colon polyps: Secondary | ICD-10-CM

## 2024-05-03 LAB — BASIC METABOLIC PANEL WITH GFR
BUN: 17 mg/dL (ref 6–23)
CO2: 30 meq/L (ref 19–32)
Calcium: 9.2 mg/dL (ref 8.4–10.5)
Chloride: 102 meq/L (ref 96–112)
Creatinine, Ser: 0.76 mg/dL (ref 0.40–1.20)
GFR: 83.99 mL/min (ref >60.00)
Glucose, Bld: 90 mg/dL (ref 70–99)
Potassium: 4.2 meq/L (ref 3.5–5.1)
Sodium: 140 meq/L (ref 135–145)

## 2024-05-03 NOTE — Progress Notes (Signed)
 Bonnie Hall

## 2024-05-03 NOTE — Patient Instructions (Signed)
 Your provider has requested that you go to the basement level for lab work before leaving today. Press B on the elevator. The lab is located at the first door on the left as you exit the elevator.  You have been scheduled for a CT scan of the abdomen and pelvis at [facility], 1st floor Radiology. You are scheduled on 05/10/24 at 2:30 pm. You should arrive 15 minutes prior to your appointment time for registration.  Please follow the written instructions below on the day of your exam:   1) Do not eat anything after 10:30 am  (4 hours prior to your test)    You may take any medications as prescribed with a small amount of water, if necessary. If you take any of the following medications: METFORMIN, GLUCOPHAGE, GLUCOVANCE, AVANDAMET, RIOMET, FORTAMET, ACTOPLUS MET, JANUMET, GLUMETZA or METAGLIP, you MAY be asked to HOLD this medication 48 hours AFTER the exam.   The purpose of you drinking the oral contrast is to aid in the visualization of your intestinal tract. The contrast solution may cause some diarrhea. Depending on your individual set of symptoms, you may also receive an intravenous injection of x-ray contrast/dye. Plan on being at [facility] for 45 minutes or longer, depending on the type of exam you are having performed.   If you have any questions regarding your exam or if you need to reschedule, you may call [facility] at [phone] between the hours of 8:00 am and 5:00 pm, Monday-Friday.    Due to recent changes in healthcare laws, you may see the results of your imaging and laboratory studies on MyChart before your provider has had a chance to review them.  We understand that in some cases there may be results that are confusing or concerning to you. Not all laboratory results come back in the same time frame and the provider may be waiting for multiple results in order to interpret others.  Please give us  48 hours in order for your provider to thoroughly review all  the results before contacting the office for clarification of your results.   Thank you for choosing me and [facility].  Elida Shawl, CRNP

## 2024-05-03 NOTE — Progress Notes (Signed)
 05/03/2024 SHARLIZE HOAR [id number] 04/17/1961   Chief Complaint: Abdominal pain   History of Present Illness: Alayna Mabe. Bonnie Hall is a 63 year old female with a past medical history of anxiety, depression, migraine headaches, arthritis, asthma, IDA, GERD and colon polyps. She is known by Dr. Albertus.   I last saw patient in office 03/05/2024 for follow-up regarding persistent generalized abdominal pain, as previously reviewed CTAP 12/30/2023 showed a moderate amount of stool in the colon otherwise no acute intraabdominal/pelvic pathology to explain her symptoms. She subsequently underwent an EGD and colonoscopy 02/15/2024. The EGD identified esophageal candidiasis which was treated with Fluconazole  x 14 days. Gastric biopsies showed mild to moderate chronic inactive gastritis without evidence of H. pylori and with the duodenum was normal, biopsies negative for celiac disease. The colonoscopy identified 2 tubular adenomatous polyps removed from the cecum and sigmoid colon and colon biopsies were negative for microscopic colitis/IBD. At the time of that office visit, she continued to have generalized abdominal pain and she was advised to follow-up with her neurologist to determine if Nortriptyline would be an appropriate treatment for her headaches as well as for her abdominal pain.  I heard a right abdominal bruit on exam and advised the patient to follow-up in 6 to 8 weeks to repeat an abdominal exam. She presents today for further follow-up.  Currently, she continues to have central abdominal pain which starts below the umbilicus and radiates up to the central upper abdomen which occurs most days and last for 1 to 5 minutes and rates the pain an 8-9, sometimes pain levels loss and if she bends forward in the morning she develops excruciating pain which takes her breath away.  She previously took MiraLAX to increase stool output without improvement. She is passing a normal brown formed stool 2-3  times daily.  No bloody or black stools.  No GERD symptoms, remains on Pantoprazole  40 mg twice daily.  She continues to have issues with lightheadedness and balance disorder followed by neurologist Dr. Skeet.  She questions if she has cervicogenic pain syndrome.  Labs 12/22/2023: WBC 5.2.  Hemoglobin 13.2.  Platelets 389.  Normal LFTs.  CRP less than 1.  Fecal calprotectin level 84.  Sed rate 2.  Labs 01/11/2024: CRP less than 3.  CTAP 12/30/2023:  COMPARISON:  CT abdomen and pelvis October 23, 2023   FINDINGS: Lower chest: No infiltrates or consolidations, no pleural effusions   Hepatobiliary: Liver normal size no masses no biliary dilatation. Gallbladder unremarkable. No gallstones.   Pancreas: Pancreas normal size. No masses calcifications or inflammatory changes.   Spleen: Spleen normal size.  No masses.   Adrenals/Urinary Tract: Adrenal glands are normal size. Follow-up recommended. Kidneys are normal. No masses calcifications or hydronephrosis   Following the intravenous administration of contrast material there is prompt bilateral symmetric nephrograms with symmetric excretion of contrast in the pelvicalyceal system and ureters. There is no persistent intraluminal filling defects or evidence of papillary necrosis.   Both ureters are normal size and trajectory.   The bladder appears grossly unremarkable without evidence of masses.   Please note the delayed images did not include bladder or pelvis.   Stomach/Bowel: No small or large bowel obstruction or inflammatory changes.   Moderate amount of residual fecal material throughout the colon without obstruction or constipation.   Vascular/Lymphatic: No significant vascular findings are present. No enlarged abdominal or pelvic lymph nodes.   Reproductive: .  No masses.   Bladder unremarkable.  Other: Anterior abdominal wall unremarkable without evidence of umbilical or inguinal hernias   Musculoskeletal:  Visualized portion of the thoracolumbar spine and pelvic structures grossly unremarkable without evidence of fracture bony abnormalities or soft tissue masses.   IMPRESSION: *No acute intra-abdominal or pelvic pathology. *Moderate amount of residual fecal material throughout the colon without obstruction or constipation.   Previously described inflammatory changes of the cecum and no longer present.    PAST GI PROCEDURES:  EGD 6/042025: - Esophageal plaques were found, consistent with candidiasis.  - Normal stomach. Biopsied.  - Normal examined duodenum. Biopsied.   Colonoscopy 02/15/2024: - The examined portion of the ileum was normal.  - One 3 mm polyp in the cecum, removed with a cold snare. Resected and retrieved.  - One 5 mm polyp in the sigmoid colon, removed with a cold snare. Resected and retrieved.  - The examination was otherwise normal. - Small internal hemorrhoids.  - Biopsies were taken with a cold forceps from the right colon and left colon for evaluation of microscopic colitis.  - No evidence of inflammatory bowel disease or macroscopic colitis  1. Surgical [P], duodenal biopsy :      -  DUODENAL MUCOSA WITH SIGNIFICANT PATHOLOGY.       2. 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      -  AN IMMUNOHISTOCHEMICAL STAIN FOR HELICOBACTER PYLORI ORGANISMS IS PENDING AND      WILL BE REPORTED IN AN ADDENDUM.       3. Surgical [P], colon, cecum polyp x 1, sigmoid polyp x 1, polyp (2) :      -  TUBULAR ADENOMA, FRAGMENTS.       4. Surgical [P], random colon biopsy sites :      -  COLONIC MUCOSA WITH NO SIGNIFICANT PATHOLOGY.   Colonoscopy 09/08/2022: - One 5 mm polyp in the descending colon, removed with a cold snare. Resected and retrieved.  - One 7 mm polyp in the distal sigmoid colon, removed with a cold snare. Resected and retrieved.  - Small internal hemorrhoids. - 7 year recall  1.  Surgical [P], colon, descending polyp x1, polyp (1) - TUBULAR ADENOMA (MULTIPLE FRAGMENTS) - NEGATIVE FOR HIGH-GRADE DYSPLASIA OR MALIGNANCY 2. Surgical [P], colon, sigmoid polyp x1, polyp (1) - COLONIC MUCOSA WITH HYPERPLASTIC CHANGES AND UNDERLYING REACTIVE LYMPHOID AGGREGATE - NEGATIVE FOR MALIGNANCY    Current Outpatient Medications on File Prior to Visit  Medication Sig Dispense Refill   ALPRAZolam (XANAX) 1 MG tablet Take 1 mg by mouth at bedtime as needed.     atorvastatin  (LIPITOR) 10 MG tablet Take 1 tablet (10 mg total) by mouth daily. 90 tablet 3   benzonatate  (TESSALON  PERLES) 100 MG capsule Take 1 capsule (100 mg total) by mouth 3 (three) times daily as needed. 30 capsule 0   erythromycin  ophthalmic ointment Apply to eye.     famotidine  (PEPCID ) 20 MG tablet Take 1 tablet (20 mg total) by mouth 2 (two) times daily. 180 tablet 1   gabapentin  (NEURONTIN ) 300 MG capsule TAKE 1 CAPSULE BY MOUTH EVERYDAY AT BEDTIME (Patient taking differently: 300 mg 3 (three) times daily.) 30 capsule 0   lamoTRIgine  (LAMICTAL ) 200 MG tablet Take 300 mg by mouth daily.     lidocaine  (LIDODERM ) 5 % Place 1 patch onto the skin daily. Remove & Discard patch within 12 hours or as directed by MD 30  patch 0   montelukast  (SINGULAIR ) 10 MG tablet Take 1 tablet (10 mg total) by mouth at bedtime. 90 tablet 1   ondansetron  (ZOFRAN -ODT) 4 MG disintegrating tablet Take 1 tablet (4 mg total) by mouth every 8 (eight) hours as needed for nausea or vomiting. DISSOLVE ONE TABLET ON TONGUE EVERY 8 HOURS AS NEEDED FOR NAUSEA OR VOMITING 60 tablet 3   pantoprazole  (PROTONIX ) 40 MG tablet Take 1 tablet (40 mg total) by mouth 2 (two) times daily. 180 tablet 1   vortioxetine HBr (TRINTELLIX) 20 MG TABS tablet Take 20 mg by mouth daily.     No current facility-administered medications on file prior to visit.   No Known Allergies  Current Medications, Allergies, Past Medical History, Past Surgical History, Family History  and Social History were reviewed in [employer] record.  Review of Systems:   Constitutional: Negative for fever, sweats, chills or weight loss.  Respiratory: Negative for shortness of breath.   Cardiovascular: Negative for chest pain, palpitations and leg swelling.  Gastrointestinal: See HPI.  Musculoskeletal: Negative for back pain or muscle aches.  Neurological: Lightheadedness and balance issues.  Physical Exam: LMP 03/12/2012  BP 114/70   Pulse 75   Ht 5' 3.5 (1.613 m)   Wt 140 lb 4 oz (63.6 kg)   LMP 03/12/2012   BMI 24.45 kg/m   Wt Readings from Last 3 Encounters:  05/03/24 140 lb 4 oz (63.6 kg)  04/26/24 139 lb 12.8 oz (63.4 kg)  04/05/24 140 lb 12.8 oz (63.9 kg)    General: 63 year old female in no acute distress. Head: Normocephalic and atraumatic. Eyes: No scleral icterus. Conjunctiva pink . Ears: Normal auditory acuity. Mouth: Dentition intact. No ulcers or lesions.  Lungs: Clear throughout to auscultation. Heart: Regular rate and rhythm, no murmur. Abdomen: Soft, nontender and nondistended. No masses or hepatomegaly. + bruit RUQ/right mid abdomen.  Rectal: Deferred.  Musculoskeletal: Symmetrical with no gross deformities. Extremities: No edema. Neurological: Alert oriented x 4. No focal deficits.  Psychological: Alert and cooperative. Normal mood and affect  Assessment and Recommendations:  63 year old female with chronic generalized abdominal pain of unclear etiology. CTAP 12/2023 showed a moderate amount of stool throughout the colon otherwise was unrevealing. No improvement on MiraLAX. EGD and colonoscopy 02/2024 findings did not explain her abdominal pain. Right abdominal bruit on exam, potentially concerning for ischemic etiology. - Abdominal/pelvic CTA with IV contrast - BMP, check BUN/creatinine level prior to the patient receiving IV contrast - Further recommendations to be determined after CTA results reviewed   GERD, stable.  EGD 02/2024 showed candidiasis esophagitis treated with a course of Fluconazole  x 14 days, chronic inactive gastritis without evidence of H. pylori. - Continue Pantoprazole  40 mg twice daily   History of colon polyps.  Colonoscopy 02/2024 identified 2 tubular adenomatous polyps removed from the colon. - Next colon polyp surveillance colonoscopy due 02/2031  Lightheadedness with gait instability, etiology unclear -Continue follow-up with neurologist  Headaches, on Gabapentin .  Deferred Nortriptyline recommendations to neurologist Dr. Skeet.

## 2024-05-04 ENCOUNTER — Ambulatory Visit: Payer: Self-pay | Admitting: Nurse Practitioner

## 2024-05-04 DIAGNOSIS — I773 Arterial fibromuscular dysplasia: Secondary | ICD-10-CM

## 2024-05-04 DIAGNOSIS — R0989 Other specified symptoms and signs involving the circulatory and respiratory systems: Secondary | ICD-10-CM

## 2024-05-09 ENCOUNTER — Other Ambulatory Visit: Payer: Self-pay | Admitting: Family

## 2024-05-09 DIAGNOSIS — M542 Cervicalgia: Secondary | ICD-10-CM

## 2024-05-09 NOTE — Telephone Encounter (Unsigned)
 Copied from CRM #[id number]. Topic: Clinical - Medication Refill >> May 09, 2024  4:31 PM Miquel SAILOR wrote: Medication: lidocaine  (LIDODERM ) 5 % [[id number]]  Has the patient contacted their pharmacy? Yes (Agent: If no, request that the patient contact the pharmacy for the refill. If patient does not wish to contact the pharmacy document the reason why and proceed with request.) (Agent: If yes, when and what did the pharmacy advise?)  This is the patient's preferred pharmacy:    [facility] - [place], [place] - [address] [place] [address] Phone: [phone] Fax: [phone]  Is this the correct pharmacy for this prescription? Yes If no, delete pharmacy and type the correct one.   Has the prescription been filled recently? Yes  Is the patient out of the medication? No 1 st refill  Has the patient been seen for an appointment in the last year OR does the patient have an upcoming appointment? Yes  Can we respond through MyChart? Yes  Agent: Please be advised that Rx refills may take up to 3 business days. We ask that you follow-up with your pharmacy.

## 2024-05-10 ENCOUNTER — Ambulatory Visit (HOSPITAL_COMMUNITY)

## 2024-05-10 ENCOUNTER — Telehealth: Payer: Self-pay | Admitting: Rheumatology

## 2024-05-10 MED ORDER — LIDOCAINE 5 % EX PTCH: 1.0000 | MEDICATED_PATCH | CUTANEOUS | 5 refills | Status: AC

## 2024-05-10 NOTE — Telephone Encounter (Signed)
 Pt left a voice messaging requesting someone in clinic to call her about her medication. Pt stated she thought it was pain medicine and not anti-inflammatory. Pt would like to discuss this.

## 2024-05-10 NOTE — Telephone Encounter (Signed)
 Patient states she misunderstood what the previous anti-inflammatory (meloxicam ) was meant to do. She thought it would work like a pain medication versus an anti-inflammatory. Patient would like to come in to discuss treatment options for inflammation. I did clarify with the patient that we do not prescribe pain medications or narcotics. Patient verbalized understanding. Patient is scheduled for 05/15/2024 at 9:30am.

## 2024-05-11 NOTE — Progress Notes (Signed)
 Office Visit Note  Patient: Bonnie Hall             Date of Birth: 1961/02/09           MRN: [id number]             PCP: Leonarda Roxan JAYSON, NP Referring: Leonarda Roxan JAYSON, NP Visit Date: 05/15/2024 Occupation: @GUAROCC @  Subjective:  Discuss meloxicam  use   History of Present Illness: CARALYNN GELBER is a 63 y.o. female with history of osteoarthritis.   Patient was seen in the office on 01/11/2024.  Patient states since her last office visit her joint pain and joint stiffness have progressively been worsening.  Her morning stiffness has been lasting up to 15 minutes daily.  She has been experiencing total body pain which is also been causing interrupted sleep at night.  Patient has been going to physical therapy for lower back pain and will be starting physical therapy for her neck as well.  Patient describes the sensation in her joints as an aching pain.  Patient presented today to discuss treatment options.  She has tried ibuprofen  and Tylenol  with minimal to no relief.  Patient states that she previously tried taking meloxicam  but thought that it was a pain reliever and did not have immediate benefit so she discontinued. She has been taking Protonix  40 mg 1 tablet twice daily as prescribed and Pepcid  20 mg twice daily.  She is not currently experiencing any GI symptoms.   Activities of Daily Living:  Patient reports morning stiffness for 10-15 minutes.   Patient Reports nocturnal pain.  Difficulty dressing/grooming: Denies Difficulty climbing stairs: Denies Difficulty getting out of chair: Denies Difficulty using hands for taps, buttons, cutlery, and/or writing: Denies  Review of Systems  Constitutional:  Positive for fatigue.  HENT:  Positive for mouth dryness. Negative for mouth sores.   Eyes:  Positive for dryness.  Respiratory:  Negative for shortness of breath.   Cardiovascular:  Negative for chest pain and palpitations.  Gastrointestinal:  Negative for blood in  stool, constipation and diarrhea.  Endocrine: Negative for increased urination.  Genitourinary:  Negative for involuntary urination.  Musculoskeletal:  Positive for joint pain, gait problem, joint pain, myalgias, morning stiffness, muscle tenderness and myalgias. Negative for joint swelling and muscle weakness.  Skin:  Negative for color change, rash, hair loss and sensitivity to sunlight.  Allergic/Immunologic: Positive for susceptible to infections.  Neurological:  Positive for dizziness and headaches.  Hematological:  Negative for swollen glands.  Psychiatric/Behavioral:  Positive for depressed mood and sleep disturbance. The patient is nervous/anxious.     PMFS History:  Patient Active Problem List   Diagnosis Date Noted   Thrush, oral 11/25/2023   Nonintractable headache 11/25/2023   Neck pain 11/25/2023   Establishing care with new doctor, encounter for 11/09/2023   Colitis 11/09/2023   Abdominal pain 11/09/2023   Herpes zoster vaccination declined 11/09/2023   Family history of Alzheimer's disease 04/14/2021   Aortic atherosclerosis (HCC) - per CT 01/30/2020 01/30/2020   Stress at home 10/01/2019   Caloric malnutrition (HCC) 10/01/2019   Mixed hyperlipidemia 07/31/2019   S/P cesarean section 03/09/2017   Abnormal glucose 01/16/2015   Vitamin D  deficiency 01/14/2014   Medication management 01/14/2014   Labile hypertension 01/14/2014   Allergy    Sleep apnea    Migraine    Bipolar disorder (manic depression) (HCC)    GERD (gastroesophageal reflux disease)     Past Medical History:  Diagnosis Date  Allergy    Hay fever/exercise induced asthma   AMA (advanced maternal age) multigravida 35+    Anxiety    on meds   Arthritis    generalized   Asthma    Blood transfusion 1990's   Colitis 10/23/2023   Depression    bipolar=on meds   GERD (gastroesophageal reflux disease)    on meds   Hyperlipidemia    on meds   Iron deficiency anemia 1990's   negative work up as  to etiology; required iron infusion, blood transfusion.  Saw an hematologist  at [facility].    Left-sided Bell's palsy 02/03/2021   Migraine    Newborn product of in vitro fertilization (IVF) pregnancy    Pregnancy induced hypertension    on meds   Preterm labor    Sleep apnea    no CPAP   Thrombocytosis    Vaginal Pap smear, abnormal     Family History  Problem Relation Age of Onset   Alzheimer's disease Mother 57   Prostate cancer Father    Arrhythmia Father    Hearing loss Daughter    Strabismus Daughter    Asperger's syndrome Daughter    ADD / ADHD Daughter    Intellectual disability Daughter    Polycystic ovary syndrome Daughter 90   Bipolar disorder Son    Colon cancer Paternal Uncle 87   Colon cancer Paternal Uncle 65   Stroke Maternal Grandmother    Stroke Maternal Grandfather    Rheum arthritis Paternal Grandmother    Colon polyps Neg Hx    Esophageal cancer Neg Hx    Stomach cancer Neg Hx    Rectal cancer Neg Hx    Past Surgical History:  Procedure Laterality Date   CESAREAN SECTION  2009   CESAREAN SECTION N/A 03/09/2017   Procedure: REPEAT CESAREAN SECTION;  Surgeon: Mat Browning, MD;  Location: [facility];  Service: Obstetrics;  Laterality: N/A;   COLONOSCOPY  06/2012   Dr.Kaplan-MAC-movi(exc)-normal-10 yr recall   GYNECOLOGIC CRYOSURGERY     NASAL SEPTUM SURGERY  1983   TONSILLECTOMY  1966   Social History   Social History Narrative   Right handed   Drinks caffeine    Two story hom   Immunization History  Administered Date(s) Administered   Influenza Inj Mdck Quad With Preservative 07/13/2018   Influenza Split 07/10/2013, 07/04/2014, 06/17/2015   Influenza,inj,Quad PF,6+ Mos 09/28/2022   Influenza-Unspecified 08/01/2021   MMR 01/16/2024   PFIZER(Purple Top)SARS-COV-2 Vaccination 12/07/2019, 12/22/2019, 03/30/2021, 11/20/2023   PNEUMOCOCCAL CONJUGATE-20 11/28/2023   PPD Test 01/14/2014, 01/16/2015   Pneumococcal Polysaccharide-23  12/31/2011   Respiratory Syncytial Virus Vaccine,Recomb Aduvanted(Arexvy) 01/16/2024   Tdap 12/28/2010, 09/13/2016, 03/30/2024     Objective: Vital Signs: BP 120/78 (BP Location: Left Arm, Patient Position: Sitting, Cuff Size: Normal)   Pulse 63   Resp 12   Ht 5' 4 (1.626 m)   Wt 138 lb (62.6 kg)   LMP 03/12/2012   BMI 23.69 kg/m    Physical Exam Vitals and nursing note reviewed.  Constitutional:      Appearance: She is well-developed.  HENT:     Head: Normocephalic and atraumatic.  Eyes:     Conjunctiva/sclera: Conjunctivae normal.  Cardiovascular:     Rate and Rhythm: Normal rate and regular rhythm.     Heart sounds: Normal heart sounds.  Pulmonary:     Effort: Pulmonary effort is normal.     Breath sounds: Normal breath sounds.  Abdominal:     General: Bowel  sounds are normal.     Palpations: Abdomen is soft.  Musculoskeletal:     Cervical back: Normal range of motion.  Lymphadenopathy:     Cervical: No cervical adenopathy.  Skin:    General: Skin is warm and dry.     Capillary Refill: Capillary refill takes less than 2 seconds.  Neurological:     Mental Status: She is alert and oriented to person, place, and time.  Psychiatric:        Behavior: Behavior normal.      Musculoskeletal Exam: C-spine has limited range of motion with lateral rotation.  Discomfort with extension of the cervical spine.  Limited mobility of the lumbar spine.  Shoulder joints, elbow joints, wrist joints, MCPs, PIPs, DIPs are good range of motion with no synovitis.  PIP and DIP thickening consistent with osteoarthritis of both hands.  Complete fist formation bilaterally.  Hip joints have good range of motion with no groin pain. No tenderness over the trochanteric bursa.  Knee joints have good range of motion no warmth or effusion.  Ankle joints have good range of motion with no tenderness or joint swelling.  CDAI Exam: CDAI Score: -- Patient Global: --; Provider Global: -- Swollen: --;  Tender: -- Joint Exam 05/15/2024   No joint exam has been documented for this visit   There is currently no information documented on the homunculus. Go to the Rheumatology activity and complete the homunculus joint exam.  Investigation: No additional findings.  Imaging: No results found.  Recent Labs: Lab Results  Component Value Date   WBC 6.5 01/04/2024   HGB 13.0 01/04/2024   PLT 409 (H) 01/04/2024   NA 140 05/03/2024   K 4.2 05/03/2024   CL 102 05/03/2024   CO2 30 05/03/2024   GLUCOSE 90 05/03/2024   BUN 17 05/03/2024   CREATININE 0.76 05/03/2024   BILITOT 0.5 01/04/2024   ALKPHOS 53 12/22/2023   AST 12 01/04/2024   ALT 9 01/04/2024   PROT 6.4 01/04/2024   ALBUMIN 4.5 12/22/2023   CALCIUM  9.2 05/03/2024   GFRAA 105 09/09/2020    Speciality Comments: No specialty comments available.  Procedures:  No procedures performed Allergies: Patient has no known allergies.   Assessment / Plan:     Visit Diagnoses: Primary osteoarthritis of both hands -RF negative, anti-CCP negative, MCV negative: X-rays were consistent with osteoarthritis.  Previously referred to pain management-- not a good candidate for narcotics due to interactions with psychiatric medications. ESR and CRP within normal limits on 01/11/2024.  Patient continues to experience intermittent pain and stiffness involving both hands.  She has no synovitis on examination today.  She has tried taking ibuprofen  and Tylenol  over-the-counter with minimal to no relief.  Patient would like to reinitiate meloxicam  to see if it will help alleviate her arthralgias and joint stiffness she has been experiencing.  Reviewed indications, contraindications, potential side effects of meloxicam  today in detail.  All questions were addressed.  Encouraged patient to take meloxicam  with food and avoid the use of over-the-counter NSAIDs. Discussed the concern for GI side effects--patient is currently taking Protonix  and Pepcid  as  recommended and her symptoms have been well-controlled.   A prescription for meloxicam  7.5 mg 1 tablet twice daily as needed was sent to the pharmacy today. She will notify us  if she cannot tolerate taking meloxicam .  She will return for updated lab work in 1 month--standing orders placed today.  She will follow-up in the office in 6 months or sooner  if needed.  Medication monitoring encounter -BMP updated on 05/03/2024: Creatinine 0.76 and GFR 83.99.  She will return for updated lab work in 1 month -standing orders for CBC and CMP were placed today.  plan: CBC with Differential/Platelet, Comprehensive metabolic panel with GFR  Primary osteoarthritis of both feet - XR 11/09/22: consistent with OA. Intermittent stiffness especially at night.  Good range of motion of both ankle joints with no tenderness or synovitis.  Chronic pain of both knees - XR unremarkable on 11/09/22.  He has been experiencing increased discomfort and stiffness in both knee joints.  On examination she has good range of motion of both knee joints with no warmth or effusion.  DDD (degenerative disc disease), cervical: MRI of the cervical spine ordered on 02/16/2024: Degenerative spondylosis with mild multilevel foraminal narrowing most notably at the left C5-C6 C6-C7.   She has been experiencing increased pain and stiffness in the cervical spine.  She has discomfort with lateral rotation and extension of the cervical spine.  She will be starting physical therapy for the neck pain and stiffness she has been experiencing.  Postural kyphosis of thoracic region  Spondylosis of lumbar spine: Chronic pain.  Currently going to PT once weekly.   Trochanteric bursitis of both hips: Not currently symptomatic.  Positive ANA (antinuclear antibody) - ANA 1: 80 nuclear, dense fine speckled on 03/10/2022.  No clinical features of systemic lupus. 11/10/2022: Double-stranded DNA negative, complements within normal limits, CK within normal limits, TSH  within normal limits, RNP negative, Ro antibody negative, La antibody negative, and RF negative.   Other medical conditions are listed as follows:  Vitamin D  deficiency  Labile hypertension: Blood pressure was 120/78 today in the office.  Aortic atherosclerosis (HCC) - per CT 01/30/2020  Mixed hyperlipidemia  History of gastroesophageal reflux (GERD)  Bipolar disorder, in partial remission, most recent episode depressed (HCC)  Other migraine without status migrainosus, not intractable  Family history of Alzheimer's disease    Orders: Orders Placed This Encounter  Procedures   CBC with Differential/Platelet   Comprehensive metabolic panel with GFR   Meds ordered this encounter  Medications   meloxicam  (MOBIC ) 7.5 MG tablet    Sig: Take 1 tablet (7.5 mg total) by mouth 2 (two) times daily as needed for pain.    Dispense:  60 tablet    Refill:  0     Follow-Up Instructions: Return in about 6 months (around 11/12/2024) for Osteoarthritis.   Waddell CHRISTELLA Craze, PA-C  Note - This record has been created using Dragon software.  Chart creation errors have been sought, but may not always  have been located. Such creation errors do not reflect on  the standard of medical care.

## 2024-05-15 ENCOUNTER — Encounter: Payer: Self-pay | Admitting: Physician Assistant

## 2024-05-15 ENCOUNTER — Ambulatory Visit: Attending: Physician Assistant | Admitting: Physician Assistant

## 2024-05-15 VITALS — BP 120/78 | HR 63 | Resp 12 | Ht 64.0 in | Wt 138.0 lb

## 2024-05-15 DIAGNOSIS — G43809 Other migraine, not intractable, without status migrainosus: Secondary | ICD-10-CM

## 2024-05-15 DIAGNOSIS — F3175 Bipolar disorder, in partial remission, most recent episode depressed: Secondary | ICD-10-CM

## 2024-05-15 DIAGNOSIS — M5030 Other cervical disc degeneration, unspecified cervical region: Secondary | ICD-10-CM

## 2024-05-15 DIAGNOSIS — R0989 Other specified symptoms and signs involving the circulatory and respiratory systems: Secondary | ICD-10-CM

## 2024-05-15 DIAGNOSIS — G8929 Other chronic pain: Secondary | ICD-10-CM

## 2024-05-15 DIAGNOSIS — I700 Atherosclerosis of aorta: Secondary | ICD-10-CM

## 2024-05-15 DIAGNOSIS — M19071 Primary osteoarthritis, right ankle and foot: Secondary | ICD-10-CM | POA: Diagnosis not present

## 2024-05-15 DIAGNOSIS — M19072 Primary osteoarthritis, left ankle and foot: Secondary | ICD-10-CM

## 2024-05-15 DIAGNOSIS — M47816 Spondylosis without myelopathy or radiculopathy, lumbar region: Secondary | ICD-10-CM

## 2024-05-15 DIAGNOSIS — Z820 Family history of epilepsy and other diseases of the nervous system: Secondary | ICD-10-CM

## 2024-05-15 DIAGNOSIS — M25561 Pain in right knee: Secondary | ICD-10-CM

## 2024-05-15 DIAGNOSIS — M25562 Pain in left knee: Secondary | ICD-10-CM

## 2024-05-15 DIAGNOSIS — M7061 Trochanteric bursitis, right hip: Secondary | ICD-10-CM

## 2024-05-15 DIAGNOSIS — M4004 Postural kyphosis, thoracic region: Secondary | ICD-10-CM

## 2024-05-15 DIAGNOSIS — E782 Mixed hyperlipidemia: Secondary | ICD-10-CM

## 2024-05-15 DIAGNOSIS — M19042 Primary osteoarthritis, left hand: Secondary | ICD-10-CM

## 2024-05-15 DIAGNOSIS — Z5181 Encounter for therapeutic drug level monitoring: Secondary | ICD-10-CM

## 2024-05-15 DIAGNOSIS — M19041 Primary osteoarthritis, right hand: Secondary | ICD-10-CM

## 2024-05-15 DIAGNOSIS — R768 Other specified abnormal immunological findings in serum: Secondary | ICD-10-CM

## 2024-05-15 DIAGNOSIS — M7062 Trochanteric bursitis, left hip: Secondary | ICD-10-CM

## 2024-05-15 DIAGNOSIS — E559 Vitamin D deficiency, unspecified: Secondary | ICD-10-CM

## 2024-05-15 DIAGNOSIS — Z8719 Personal history of other diseases of the digestive system: Secondary | ICD-10-CM

## 2024-05-15 MED ORDER — MELOXICAM 7.5 MG PO TABS: 7.5000 mg | ORAL_TABLET | Freq: Two times a day (BID) | ORAL | 0 refills | Status: DC | PRN

## 2024-05-15 NOTE — Patient Instructions (Signed)
 Standing Labs We placed an order today for your standing lab work.   Please have your standing labs drawn in 1 month   Please have your labs drawn 2 weeks prior to your appointment so that the provider can discuss your lab results at your appointment, if possible.  Please note that you may see your imaging and lab results in MyChart before we have reviewed them. We will contact you once all results are reviewed. Please allow our office up to 72 hours to thoroughly review all of the results before contacting the office for clarification of your results.  WALK-IN LAB HOURS  Monday through Thursday from 8:00 am -12:30 pm and 1:00 pm-4:30 pm and Friday from 8:00 am-12:00 pm.  Patients with office visits requiring labs will be seen before walk-in labs.  You may encounter longer than normal wait times. Please allow additional time. Wait times may be shorter on  Monday and Thursday afternoons.  We do not book appointments for walk-in labs. We appreciate your patience and understanding with our staff.   Labs are drawn by [facility]. Please bring your co-pay at the time of your lab draw.  You may receive a bill from [facility] for your lab work.  Please note if you are on Hydroxychloroquine  and and an order has been placed for a Hydroxychloroquine  level,  you will need to have it drawn 4 hours or more after your last dose.  If you wish to have your labs drawn at another location, please call the office 24 hours in advance so we can fax the orders.  The office is located at [address], [place], [place] [address]   If you have any questions regarding directions or hours of operation,  please call [phone].   As a reminder, please drink plenty of water prior to coming for your lab work. Thanks!

## 2024-05-22 NOTE — Telephone Encounter (Signed)
 Bonnie Hall is requesting a later time for her BOTOX appointment. She states that the day is fine she needs an appointment around 10:45. She has to take her children to school and she resides in [place]. Please advise.
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
## 2024-05-23 ENCOUNTER — Ambulatory Visit (HOSPITAL_COMMUNITY)

## 2024-05-23 NOTE — Telephone Encounter (Signed)
 Spoke with patient and she states she takes her child to school and get there until after 10 am- time changed to 1040 am- patient confirmed

## 2024-05-25 DIAGNOSIS — Z419 Encounter for procedure for purposes other than remedying health state, unspecified: Secondary | ICD-10-CM | POA: Diagnosis not present

## 2024-05-26 ENCOUNTER — Ambulatory Visit (HOSPITAL_BASED_OUTPATIENT_CLINIC_OR_DEPARTMENT_OTHER)
Admission: RE | Admit: 2024-05-26 | Discharge: 2024-05-26 | Disposition: A | Discharge status: Home / Self Care | Source: Ambulatory Visit | Attending: Nurse Practitioner | Admitting: Nurse Practitioner

## 2024-05-26 DIAGNOSIS — G8929 Other chronic pain: Secondary | ICD-10-CM | POA: Diagnosis present

## 2024-05-26 DIAGNOSIS — R0989 Other specified symptoms and signs involving the circulatory and respiratory systems: Secondary | ICD-10-CM | POA: Diagnosis present

## 2024-05-26 DIAGNOSIS — R109 Unspecified abdominal pain: Secondary | ICD-10-CM | POA: Insufficient documentation

## 2024-05-26 MED ORDER — IOHEXOL 350 MG/ML SOLN
100.0000 mL | Freq: Once | INTRAVENOUS | Status: AC | PRN
  Administered 2024-05-26: 100 mL via INTRAVENOUS

## 2024-05-27 NOTE — Progress Notes (Signed)
 Addendum: Reviewed and agree with assessment and management plan. If unexplained abdominal pain persists and her CTA is unrevealing a second opinion at a [facility] would be appropriate. Malay Fantroy, Gordy HERO, MD

## 2024-05-30 ENCOUNTER — Ambulatory Visit: Admitting: Family

## 2024-06-11 ENCOUNTER — Ambulatory Visit: Admitting: Cardiology

## 2024-06-13 ENCOUNTER — Ambulatory Visit: Attending: Internal Medicine | Admitting: Internal Medicine

## 2024-06-13 ENCOUNTER — Encounter: Payer: Self-pay | Admitting: *Deleted

## 2024-06-13 ENCOUNTER — Other Ambulatory Visit: Payer: Self-pay | Admitting: *Deleted

## 2024-06-13 VITALS — BP 118/73 | HR 56 | Ht 64.0 in | Wt 141.0 lb

## 2024-06-13 DIAGNOSIS — R001 Bradycardia, unspecified: Secondary | ICD-10-CM | POA: Insufficient documentation

## 2024-06-13 DIAGNOSIS — R55 Syncope and collapse: Secondary | ICD-10-CM

## 2024-06-13 DIAGNOSIS — I700 Atherosclerosis of aorta: Secondary | ICD-10-CM | POA: Diagnosis not present

## 2024-06-13 NOTE — Progress Notes (Signed)
 Cardiology Office Note:  .    Date:  06/13/2024  ID:  Bonnie Hall, DOB Jul 01, 1961, MRN [id number] PCP: Bonnie Roxan JAYSON, NP  [facility] Providers Cardiologist:  Bonnie DELENA Leavens, MD     CC: jaw pain Consulted for the evaluation of light-headness at the behest of Bonnie Hall   History of Present Illness: .    Bonnie Hall is a 63 y.o. female who presents with potential cardiac syncope. She is accompanied by her daughter. She was referred by Bonnie Hall and sees Bonnie Hall  She experiences persistent lightheadedness and balance issues, with episodes of veering to the side while walking. She has not experienced syncope but confirms lightheadedness rather than dizziness. Earlier in the year, she experienced periodic chest aches, which have since resolved. She did not report chest pain, pressure, or tightness radiating to her arm or neck at the time of the visit.  She experiences shortness of breath when climbing stairs, which she attributes to lack of regular exercise. No swelling in her legs or abdomen, and no palpitations or irregular heartbeats.  In February, she visited the emergency room with severe symptoms including dizziness, stomach pain, nausea, and back pain. Since then, she has experienced ongoing lightheadedness, balance issues, stomach issues, and back pain. She suspects these symptoms are related but has not received a definitive diagnosis.  Additionally, she reports frequent teeth aches occurring mainly in the morning and occasionally throughout the day.  Relevant histories: .  Social  - father has history of PPM for unclear indication ROS: As per HPI.   Studies Reviewed: .     Cardiac Studies & Procedures   ______________________________________________________________________________________________     ECHOCARDIOGRAM  ECHOCARDIOGRAM COMPLETE 12/24/2016  Narrative *[place]* *[facility]* [address] [place], [place] [address] [phone]  ------------------------------------------------------------------- Transthoracic Echocardiography  Patient:    Bonnie Hall MR #:       [id number] Study Date: 12/24/2016 Gender:     F Age:        56 Height:     167.6 cm Weight:     86.6 kg BSA:        2.04 m^2 Pt. Status: Room:  ATTENDING    Default, Provider [phone] Bonnie Hall [id number] ORDERING     Provider, Historical REFERRING    Provider, Historical PERFORMING   [facility], Outpatient SONOGRAPHER  Bonnie Hall  cc:  ------------------------------------------------------------------- LV EF: 60% -   65%  ------------------------------------------------------------------- Indications:      Dyspnea 786.09.  ------------------------------------------------------------------- History:   Risk factors:  Hypertension. Dyslipidemia.  ------------------------------------------------------------------- Study Conclusions  - Left ventricle: The cavity size was normal. There was mild concentric hypertrophy. Systolic function was normal. The estimated ejection fraction was in the range of 60% to 65%. Wall motion was normal; there were no regional wall motion abnormalities. Doppler parameters are consistent with abnormal left ventricular relaxation (grade 1 diastolic dysfunction). Doppler parameters are consistent with indeterminate ventricular filling pressure. - Aortic valve: Transvalvular velocity was within the normal range. There was no stenosis. There was no regurgitation. Valve area (VTI): 1.94 cm^2. Valve area (Vmax): 1.96 cm^2. Valve area (Vmean): 2.04 cm^2. - Mitral valve: Transvalvular velocity was within the normal range. There was no evidence for stenosis. There was trivial regurgitation. - Right ventricle: The cavity size was normal. Wall thickness was normal. Systolic function was normal. - Atrial septum: No defect or patent foramen ovale was  identified. - Tricuspid valve: There was  no regurgitation. - Pulmonary arteries: Systolic pressure was within the normal range. PA peak pressure: 23 mm Hg (S). - Global longitudinal strain -22.6%.  ------------------------------------------------------------------- Study data:  The previous study was not available, so comparison was made to the report of 01/16/2016.  Study status:  Routine. Procedure:  Transthoracic echocardiography. Image quality was adequate.  Study completion:  There were no complications. Transthoracic echocardiography.  M-mode, complete 2D, spectral Doppler, and color Doppler.  Birthdate:  Patient birthdate: 1961/07/31.  Age:  Patient is 63 yr old.  Sex:  Gender: female. BMI: 30.8 kg/m^2.  Blood pressure:     125/73  Patient status: Outpatient.  Study date:  Study date: 12/24/2016. Study time: 09:23 AM.  Location:  Echo laboratory.  -------------------------------------------------------------------  ------------------------------------------------------------------- Left ventricle:  The cavity size was normal. There was mild concentric hypertrophy. Systolic function was normal. The estimated ejection fraction was in the range of 60% to 65%. Wall motion was normal; there were no regional wall motion abnormalities. Doppler parameters are consistent with abnormal left ventricular relaxation (grade 1 diastolic dysfunction). Doppler parameters are consistent with indeterminate ventricular filling pressure.  ------------------------------------------------------------------- Aortic valve:   Trileaflet; normal thickness leaflets. Mobility was not restricted.  Doppler:  Transvalvular velocity was within the normal range. There was no stenosis. There was no regurgitation. VTI ratio of LVOT to aortic valve: 0.85. Valve area (VTI): 1.94 cm^2. Indexed valve area (VTI): 0.95 cm^2/m^2. Peak velocity ratio of LVOT to aortic valve: 0.86. Valve area (Vmax): 1.96  cm^2. Indexed valve area (Vmax): 0.96 cm^2/m^2. Mean velocity ratio of LVOT to aortic valve: 0.9. Valve area (Vmean): 2.04 cm^2. Indexed valve area (Vmean): 1 cm^2/m^2.    Mean gradient (S): 6 mm Hg. Peak gradient (S): 13 mm Hg.  ------------------------------------------------------------------- Aorta:  Aortic root: The aortic root was normal in size.  ------------------------------------------------------------------- Mitral valve:   Structurally normal valve.   Mobility was not restricted.  Doppler:  Transvalvular velocity was within the normal range. There was no evidence for stenosis. There was trivial regurgitation.  ------------------------------------------------------------------- Left atrium:  The atrium was normal in size.  ------------------------------------------------------------------- Atrial septum:  No defect or patent foramen ovale was identified.  ------------------------------------------------------------------- Right ventricle:  The cavity size was normal. Wall thickness was normal. Systolic function was normal.  ------------------------------------------------------------------- Pulmonic valve:    Structurally normal valve.   Cusp separation was normal.  Doppler:  Transvalvular velocity was within the normal range. There was no evidence for stenosis. There was no regurgitation.  ------------------------------------------------------------------- Tricuspid valve:   Structurally normal valve.    Doppler: Transvalvular velocity was within the normal range. There was no regurgitation.  ------------------------------------------------------------------- Pulmonary artery:   The main pulmonary artery was normal-sized. Systolic pressure was within the normal range.  ------------------------------------------------------------------- Right atrium:  The atrium was normal in  size.  ------------------------------------------------------------------- Pericardium:  There was no pericardial effusion.  ------------------------------------------------------------------- Systemic veins: Inferior vena cava: The vessel was normal in size. The respirophasic diameter changes were in the normal range (>= 50%), consistent with normal central venous pressure.  ------------------------------------------------------------------- Measurements  Left ventricle                          Value           Reference LV ID, ED, PLAX chordal          (L)    42.9   mm       43 - 52 LV ID, ES, PLAX chordal  28.8   mm       23 - 38 LV fx shortening, PLAX chordal          33     %        >=29 LV PW thickness, ED                     11.7   mm       ---------- IVS/LV PW ratio, ED                     1.05            <=1.3 Stroke volume, 2D                       62     ml       ---------- Stroke volume/bsa, 2D                   30     ml/m^2   ---------- LV e&', lateral                          10.6   cm/s     ---------- LV E/e&', lateral                        6.31            ---------- LV e&', medial                           6.2    cm/s     ---------- LV E/e&', medial                         10.79           ---------- LV e&', average                          8.4    cm/s     ---------- LV E/e&', average                        7.96            ---------- Longitudinal strain, TDI                23     %        ----------  Ventricular septum                      Value           Reference IVS thickness, ED                       12.3   mm       ----------  LVOT                                    Value           Reference LVOT ID, S                              17  mm       ---------- LVOT area                               2.27   cm^2     ---------- LVOT peak velocity, S                   157    cm/s     ---------- LVOT mean velocity, S                   107    cm/s      ---------- LVOT VTI, S                             27.1   cm       ---------- LVOT peak gradient, S                   10     mm Hg    ----------  Aortic valve                            Value           Reference Aortic valve peak velocity, S           182    cm/s     ---------- Aortic valve mean velocity, S           119    cm/s     ---------- Aortic valve VTI, S                     31.7   cm       ---------- Aortic mean gradient, S                 6      mm Hg    ---------- Aortic peak gradient, S                 13     mm Hg    ---------- VTI ratio, LVOT/AV                      0.85            ---------- Aortic valve area, VTI                  1.94   cm^2     ---------- Aortic valve area/bsa, VTI              0.95   cm^2/m^2 ---------- Velocity ratio, peak, LVOT/AV           0.86            ---------- Aortic valve area, peak velocity        1.96   cm^2     ---------- Aortic valve area/bsa, peak             0.96   cm^2/m^2 ---------- velocity Velocity ratio, mean, LVOT/AV           0.9             ---------- Aortic valve area, mean velocity        2.04   cm^2     ---------- Aortic valve area/bsa, mean  1      cm^2/m^2 ---------- velocity  Aorta                                   Value           Reference Aortic root ID, ED                      33     mm       ----------  Left atrium                             Value           Reference LA ID, A-P, ES                          41     mm       ---------- LA ID/bsa, A-P                          2.01   cm/m^2   <=2.2 LA volume, S                            47.1   ml       ---------- LA volume/bsa, S                        23.1   ml/m^2   ---------- LA volume, ES, 1-p A4C                  43.8   ml       ---------- LA volume/bsa, ES, 1-p A4C              21.5   ml/m^2   ---------- LA volume, ES, 1-p A2C                  46.1   ml       ---------- LA volume/bsa, ES, 1-p A2C              22.6   ml/m^2   ----------  Mitral valve                             Value           Reference Mitral E-wave peak velocity             66.9   cm/s     ---------- Mitral A-wave peak velocity             83.9   cm/s     ---------- Mitral deceleration time                201    ms       150 - 230 Mitral E/A ratio, peak                  0.8             ----------  Pulmonary arteries                      Value  Reference PA pressure, S, DP                      23     mm Hg    <=30  Tricuspid valve                         Value           Reference Tricuspid regurg peak velocity          223.84 cm/s     ---------- Tricuspid peak RV-RA gradient           20     mm Hg    ---------- Tricuspid maximal regurg                223.84 cm/s     ---------- velocity, PISA  Right atrium                            Value           Reference RA ID, S-I, ES, A4C                     40.5   mm       34 - 49 RA area, ES, A4C                        9.46   cm^2     8.3 - 19.5 RA volume, ES, A/L                      17.2   ml       ---------- RA volume/bsa, ES, A/L                  8.4    ml/m^2   ----------  Systemic veins                          Value           Reference Estimated CVP                           3      mm Hg    ----------  Right ventricle                         Value           Reference RV ID, minor axis, ED, A4C base         29.1   mm       ---------- TAPSE                                   18.2   mm       ---------- RV pressure, S, DP                      23     mm Hg    <=30 RV s&', lateral, S                       10.2   cm/s     ----------  Legend: (L)  and  (H)  mark values outside specified reference range.  ------------------------------------------------------------------- Prepared and Electronically Authenticated by  Annabella Scarce, MD 2018-04-13T13:39:24          ______________________________________________________________________________________________      RADIOLOGY CT abdomen and pelvis: Mitral  valve calcifications, dilated right ventricle, scattered aortic atherosclerosis, no coronary artery calcification, proximal LAD, LCX, and RCA not well visualized (05/26/2024)  DIAGNOSTIC EKG: Sinus bradycardia (06/13/2024)   Physical Exam:    VS:  BP 118/73   Pulse (!) 56   Ht 5' 4 (1.626 m)   Wt 141 lb (64 kg)   LMP 03/12/2012   SpO2 95%   BMI 24.20 kg/m    Wt Readings from Last 3 Encounters:  06/13/24 141 lb (64 kg)  05/15/24 138 lb (62.6 kg)  05/03/24 140 lb 4 oz (63.6 kg)    Gen: no distress   Neck: No JVD Cardiac: No Rubs or Gallops, no Murmur, regular bradycardia, +2 radial pulses Respiratory: Clear to auscultation bilaterally, normal effort, normal  respiratory rate GI: Soft, nontender, non-distended  MS: No  edema;  moves all extremities Integument: Skin feels warm Neuro:  At time of evaluation, alert and oriented to person/place/time/situation  Psych: Normal affect, patient feels ok   ASSESSMENT AND PLAN: .     An EKG was ordered for syncope and shows SBRAD  Dizziness and balance disturbance for possible cardiac etiology Dizziness and balance disturbances without clear evidence of cardiac syncope. Symptoms include lightheadedness and gait changes, less likely to be cardiac in origin. No significant palpitations or syncope events. Sinus bradycardia observed but no evidence of CHB. - Order stress test to evaluate cardiac function during exertion (teeth pain with normal dental exam by her dentist, with cardiac risk factors) - Check cholesterol levels on the day of the stress test (LDL Direct) - If stress test is normal and symptoms persist, consider echocardiogram to rule out structural heart issues  Sinus bradycardia Sinus bradycardia observed on EKG. Heart rate is low but not considered malignant. No significant arrhythmias or palpitations reported. - no plan for monitor at this time  Aortic atherosclerosis Hyperlipidemia Mild aortic atherosclerosis noted on  CT scan. No coronary artery calcification observed, but proximal LAD, CERC, and RCA not well visualized. Emphasis on aggressive prevention to avoid future cardiac events. - Encourage regular exercise to improve cardiovascular health - Discuss lifestyle modifications for cardiovascular risk reduction - LDL goal < 70; no change to statin at this time  If no cardiac issues, PRN f/u   Bonnie Leavens, MD FASE [facility] Cardiologist [facility]  [address] [place], [address] [place], [place] [address] [phone]  12:48 PM

## 2024-06-13 NOTE — Patient Instructions (Signed)
 Medication Instructions:  Your physician recommends that you continue on your current medications as directed. Please refer to the Current Medication list given to you today.   *If you need a refill on your cardiac medications before your next appointment, please call your pharmacy*  Lab Work: Have lab work done when you come back for stress test.  LDL.  Lab is on the first floor of our building If you have labs (blood work) drawn today and your tests are completely normal, you will receive your results only by: MyChart Message (if you have MyChart) OR A paper copy in the mail If you have any lab test that is abnormal or we need to change your treatment, we will call you to review the results.  Testing/Procedures: Your physician has requested that you have an exercise stress myoview. For further information please visit [email or web address] Please follow instruction sheet, as given.   Follow-Up: At [facility], you and your health needs are our priority.  As part of our continuing mission to provide you with exceptional heart care, our providers are all part of one team.  This team includes your primary Cardiologist (physician) and Advanced Practice Providers or APPs (Physician Assistants and Nurse Practitioners) who all work together to provide you with the care you need, when you need it.  Your next appointment:   As needed based on test results  Provider:   Stanly DELENA Leavens, MD    We recommend signing up for the patient portal called MyChart.  Sign up information is provided on this After Visit Summary.  MyChart is used to connect with patients for Virtual Visits (Telemedicine).  Patients are able to view lab/test results, encounter notes, upcoming appointments, etc.  Non-urgent messages can be sent to your provider as well.   To learn more about what you can do with MyChart, go to [email or web address].   Other Instructions

## 2024-06-15 ENCOUNTER — Other Ambulatory Visit: Payer: Self-pay | Admitting: Physician Assistant

## 2024-06-15 ENCOUNTER — Other Ambulatory Visit: Payer: Self-pay | Admitting: Rheumatology

## 2024-06-15 MED ORDER — MELOXICAM 7.5 MG PO TABS: 7.5000 mg | ORAL_TABLET | Freq: Two times a day (BID) | ORAL | 2 refills | Status: DC | PRN

## 2024-06-15 NOTE — Telephone Encounter (Signed)
 Last Fill: 05/15/2024 (30 day supply)  Labs: 01/04/2024 Platelets 409, 05/03/2024 BMP WNL  Next Visit: 11/12/2024  Last Visit: 05/15/2024  DX:  Primary osteoarthritis of both hands   Current Dose per office note 05/15/2024: Meloxicam  7.5 mg 1 tablet twice daily as needed   Okay to refill Meloxicam ?

## 2024-06-15 NOTE — Telephone Encounter (Signed)
 Patient contacted the office to request a medication refill.   1. Name of Medication: Meloxicam   2. How are you currently taking this medication (dosage and times per day)? 7.5 mg /2 times daily   3. What pharmacy would you like for that to be sent to? [facility] [employer]  Patient also requested a prescription of Meloxicam  be sent to [facility].  Patient is requesting both be sent at the same time because it takes approximately 20 days for the refill to be processed.

## 2024-06-26 NOTE — Progress Notes (Unsigned)
 NEUROLOGY FOLLOW UP OFFICE NOTE  Bonnie Hall [id number]  Assessment/Plan:   Lightheadedness/near syncope.  Not associated with orthostatic hypotension.  Must evaluate underlying cardiac etiology. Chronic tension type headache vs new daily persistent headache Sudden episodic gait instability - unclear etiology.  Not consistent with dizziness.  Not associated with sudden leg weakness or other focal abnormality Etiology for constellation of symptoms unclear.  They clearly became evident after her episode of colitis, raising possibility of some chronic post-inflammatory syndrome   For treatment of headaches, would prefer to use nortriptyline or topiramate.  Second line would be divalproex.  Will contact her psychiatrist's office to determine if they have a preference to one or that either medication is not recommended. Refer to cardiology for further evaluation of near syncope Physical therapy for gait instability Follow up 6 months.  Total time spent in chart and face to face with patient:  47 minutes   Subjective:  Bonnie Hall is a 63 year old female with HLD, asthma, colitis, Bipolar disorder, depression and anxiety, Meig's syndrome and history of left-sided Bell's palsy who follows up for headache and dizziness.  UPDATE: To evaluate for cervicogenic cause of her symptoms, she had an MRI of the cervical spine without contrast on 02/16/2024 which revealed degenerative spondylosis and mild multilevel foraminal narrowing most notable on the left at C5-6 and C6-7 with mild effacement of the ventral thecal sac causing mild spinal stenosis.  Findings not believed to be contributing to her symptoms.   She remains on gabapentin  300mg  three times daily as she is hesitant about increasing dose.  ***.    She has been going to PT for her gait.  She had a mechanical fall on 7/18 when she tripped on the steps while carrying in the groceries.  Seen in the ED.  CT head negative for acute  intracranial abnormalities.    Continues to feel lightheaded.  Evaluated by cardiology.  EKG revealed sinus bradycardia but nothing significant.  Symptoms not felt to be cardiac in nature but stress test has been ordered.    Current NSAIDS/analgesics:  meloxicam  7.5mg  Current triptans:  none Current ergotamine:  none Current anti-emetic:  Zofran -ODT 4mg  Current muscle relaxants:  none Current Antihypertensive medications:  none Current Antidepressant medications:  Trintellix, trazodone 200mg  at bedtime Current Anticonvulsant medications:  lamotrigine  300mg  daily, gabapentin  300mg  three times daily Current anti-CGRP:  none Other medications:  alprazolam  Caffeine :  No coffee.  A little bit of cola daily. Hydrates Sleep hygiene:  wakes up once in middle of night and once early in morning to use bathroom.  Able to quickly fall asleep.  Snores.  Was previously diagnosed with OSA years ago and used CPAP for short time.  No excessive daytime sleepiness.    HISTORY: Syncope/near syncope, persistent headache, gait instability.: She was hospitalized on October 23, 2023 for abdominal pain, nausea and diarrhea and was diagnosed with colitis.  Since then, she has had a persistent dull small circumscribed pressure/throbbing pain in the frontal region between or above either eyebrow.  Occurs daily and lasts all day.  No associated photophobia, phonophobia or visual disturbance.  Has nausea due to colitis but not specifically associated with these headaches.  No specific triggers or exacerbating factors.  Does not respond to her Fioricet or OTC analgesics such as Tylenol  or ibuprofen .  She has cervical spondylosis with some neck pain and underwent chiropractic manipulation which did not  help the headache  At the same time, she has  been experiencing lightheadedness, a sensation that she is going to pass out.  No associated tunnel vision, diaphoresis, chest pain or palpitations.  Not positional and may occur  any time, even just while quietly sitting.  It has never happened while driving.  Occurs daily.    She also endorses brief episodes of dizziness.  While walking, she may suddenly lose balance and veer to either side.  Lasts seconds to a couple of minutes.  No associated dizziness or similar sensation in her head.  Leg does not give out.  It just happens.  It has caused her to bump into walls and furniture.  This also occurs daily.    MRI and MRA of head on 01/18/2024 revealed mild white matter changes in the predominantly frontal lobes and one old chronic microhemorrhage in the right parietal lobe but no acute intracranial abnormality, aneurysm or high-grade stenosis.  Migraines: She has longstanding history of migraines that are currently well controlled.  They usually start in back of head or front and radiates diffusely, a severe (10/10) pounding pain associated with nausea, vomiting, photophobia and phonophobia.  If Fioricet taken early, gets relief in 15 minutes.  Hasn't had one in a while.     She had an unremarkable MRI of the brain without contrast on 01/28/2021 for left sided facial weakness which was ultimately found to be Bell's palsy.    Past NSAIDS/analgesics:  Tylenol , ibuprofen , Fioricet Past anti-emetic:  promethazine  25mg  Past antihypertensive medications:  labetolol Past antidepressant medications:  Wellbutrin Past antiepileptic medication:  none     PAST MEDICAL HISTORY: Past Medical History:  Diagnosis Date   Allergy    Hay fever/exercise induced asthma   AMA (advanced maternal age) multigravida 35+    Anxiety    on meds   Arthritis    generalized   Asthma    Blood transfusion 1990's   Colitis 10/23/2023   Depression    bipolar=on meds   GERD (gastroesophageal reflux disease)    on meds   Hyperlipidemia    on meds   Iron deficiency anemia 1990's   negative work up as to etiology; required iron infusion, blood transfusion.  Saw an hematologist  at [facility].     Left-sided Bell's palsy 02/03/2021   Migraine    Newborn product of in vitro fertilization (IVF) pregnancy    Pregnancy induced hypertension    on meds   Preterm labor    Sleep apnea    no CPAP   Thrombocytosis    Vaginal Pap smear, abnormal     MEDICATIONS: Current Outpatient Medications on File Prior to Visit  Medication Sig Dispense Refill   ALPRAZolam (XANAX) 1 MG tablet Take 1 mg by mouth at bedtime as needed.     atorvastatin  (LIPITOR) 10 MG tablet Take 1 tablet (10 mg total) by mouth daily. 90 tablet 3   benzonatate  (TESSALON  PERLES) 100 MG capsule Take 1 capsule (100 mg total) by mouth 3 (three) times daily as needed. 30 capsule 0   erythromycin  ophthalmic ointment Apply to eye.     famotidine  (PEPCID ) 20 MG tablet Take 1 tablet (20 mg total) by mouth 2 (two) times daily. 180 tablet 1   gabapentin  (NEURONTIN ) 300 MG capsule TAKE 1 CAPSULE BY MOUTH EVERYDAY AT BEDTIME (Patient taking differently: 300 mg 3 (three) times daily.) 30 capsule 0   lamoTRIgine  (LAMICTAL ) 200 MG tablet Take 300 mg by mouth daily.     lidocaine  (LIDODERM ) 5 % Place 1 patch onto the  skin daily. Remove & Discard patch within 12 hours or as directed by MD 30 patch 5   meloxicam  (MOBIC ) 7.5 MG tablet Take 1 tablet (7.5 mg total) by mouth 2 (two) times daily as needed for pain. 60 tablet 2   montelukast  (SINGULAIR ) 10 MG tablet Take 1 tablet (10 mg total) by mouth at bedtime. 90 tablet 1   ondansetron  (ZOFRAN -ODT) 4 MG disintegrating tablet Take 1 tablet (4 mg total) by mouth every 8 (eight) hours as needed for nausea or vomiting. DISSOLVE ONE TABLET ON TONGUE EVERY 8 HOURS AS NEEDED FOR NAUSEA OR VOMITING 60 tablet 3   pantoprazole  (PROTONIX ) 40 MG tablet Take 1 tablet (40 mg total) by mouth 2 (two) times daily. 180 tablet 1   traZODone (DESYREL) 100 MG tablet Take 200 mg by mouth at bedtime.     vortioxetine HBr (TRINTELLIX) 20 MG TABS tablet Take 20 mg by mouth daily.     No current facility-administered  medications on file prior to visit.    ALLERGIES: No Known Allergies  FAMILY HISTORY: Family History  Problem Relation Age of Onset   Alzheimer's disease Mother 88   Prostate cancer Father    Arrhythmia Father    Hearing loss Daughter    Strabismus Daughter    Asperger's syndrome Daughter    ADD / ADHD Daughter    Intellectual disability Daughter    Polycystic ovary syndrome Daughter 36   Bipolar disorder Son    Colon cancer Paternal Uncle 64   Colon cancer Paternal Uncle 89   Stroke Maternal Grandmother    Stroke Maternal Grandfather    Rheum arthritis Paternal Grandmother    Colon polyps Neg Hx    Esophageal cancer Neg Hx    Stomach cancer Neg Hx    Rectal cancer Neg Hx       Objective:  Blood pressure 105/60, pulse 79, height 5' 6 (1.676 m), weight 145 lb (65.8 kg), last menstrual period 03/12/2012, SpO2 96%. General: No acute distress.  Patient appears well-groomed.   Head:  Normocephalic/atraumatic Eyes:  Fundi examined but not visualized Neck: supple, no paraspinal tenderness, full range of motion Heart:  Regular rate and rhythm Lungs:  Clear to auscultation bilaterally Back: No paraspinal tenderness Neurological Exam: alert and oriented.  Speech fluent and not dysarthric, language intact.  CN II-XII intact. Bulk and tone normal, muscle strength 5/5 throughout.  Sensation to temperature and vibration intact.  Deep tendon reflexes 2+ throughout, toes downgoing.  Finger to nose testing intact.  Gait normal.  Able to turn but now has some difficulty with tandem walk.  Romberg negative.   Juliene Dunnings, DO  CC: Roxan Plough, NP

## 2024-06-27 ENCOUNTER — Encounter: Payer: Self-pay | Admitting: Neurology

## 2024-06-27 ENCOUNTER — Ambulatory Visit (INDEPENDENT_AMBULATORY_CARE_PROVIDER_SITE_OTHER): Admitting: Neurology

## 2024-06-27 VITALS — BP 106/64 | HR 82 | Ht 64.0 in | Wt 144.4 lb

## 2024-06-27 DIAGNOSIS — R42 Dizziness and giddiness: Secondary | ICD-10-CM

## 2024-06-27 DIAGNOSIS — G43009 Migraine without aura, not intractable, without status migrainosus: Secondary | ICD-10-CM

## 2024-06-27 MED ORDER — SUMATRIPTAN SUCCINATE 50 MG PO TABS: 50.0000 mg | ORAL_TABLET | ORAL | 5 refills | Status: AC | PRN

## 2024-06-27 MED ORDER — AIMOVIG 140 MG/ML ~~LOC~~ SOAJ: 140.0000 mg | SUBCUTANEOUS | 5 refills | Status: AC

## 2024-06-27 NOTE — Patient Instructions (Signed)
 Start Aimovig 140mg  every 28 days - if no improvement in 3 months, contact me Stop Fioricet and over the counter analgesics Take sumatriptan at earliest onset of headache.  May repeat after 2 hours.  Maximum 2 tablets in 24 hours. Limit use of pain relievers to no more than 9 days out of the month to prevent risk of rebound or medication-overuse headache. Keep headache diary Follow up 6 months.

## 2024-06-28 ENCOUNTER — Encounter (HOSPITAL_COMMUNITY)

## 2024-06-28 ENCOUNTER — Other Ambulatory Visit: Payer: Self-pay | Admitting: Family

## 2024-06-28 DIAGNOSIS — K219 Gastro-esophageal reflux disease without esophagitis: Secondary | ICD-10-CM

## 2024-07-04 ENCOUNTER — Ambulatory Visit: Admitting: Neurology

## 2024-07-11 ENCOUNTER — Encounter: Payer: Self-pay | Admitting: Vascular Surgery

## 2024-07-11 ENCOUNTER — Ambulatory Visit: Attending: Vascular Surgery | Admitting: Vascular Surgery

## 2024-07-11 VITALS — BP 112/73 | HR 64 | Temp 97.9°F | Ht 64.0 in | Wt 143.9 lb

## 2024-07-11 DIAGNOSIS — I773 Arterial fibromuscular dysplasia: Secondary | ICD-10-CM | POA: Diagnosis not present

## 2024-07-11 NOTE — Progress Notes (Signed)
 Patient ID: Bonnie Hall, female   DOB: May 08, 1961, 63 y.o.   MRN: [id number]  Reason for Consult: No chief complaint on file.   Referred by Ngetich, Roxan JAYSON, NP  Subjective:     HPI:  Bonnie Hall is a 63 y.o. female followed for mid abdominal pain which is intermittent in nature.  Most recently CT scan was performed which demonstrated fibromuscular dysplasia.  She is unaware of a personal or family history of fibromuscular dysplasia.  She has no history of strokes, TIA or amaurosis and walks without limitation although she does have numbness of her left great toe which has been present for many months and is otherwise unexplained.  She denies frank claudication.  No underlying renal dysfunction.  Past Medical History:  Diagnosis Date   Allergy    Hay fever/exercise induced asthma   AMA (advanced maternal age) multigravida 35+    Anxiety    on meds   Arthritis    generalized   Asthma    Blood transfusion 1990's   Colitis 10/23/2023   Depression    bipolar=on meds   GERD (gastroesophageal reflux disease)    on meds   Hyperlipidemia    on meds   Iron deficiency anemia 1990's   negative work up as to etiology; required iron infusion, blood transfusion.  Saw an hematologist  at [facility].    Left-sided Bell's palsy 02/03/2021   Migraine    Newborn product of in vitro fertilization (IVF) pregnancy    Pregnancy induced hypertension    on meds   Preterm labor    Sleep apnea    no CPAP   Thrombocytosis    Vaginal Pap smear, abnormal    Family History  Problem Relation Age of Onset   Alzheimer's disease Mother 51   Prostate cancer Father    Arrhythmia Father    Hearing loss Daughter    Strabismus Daughter    Asperger's syndrome Daughter    ADD / ADHD Daughter    Intellectual disability Daughter    Polycystic ovary syndrome Daughter 73   Bipolar disorder Son    Colon cancer Paternal Uncle 68   Colon cancer Paternal Uncle 33   Stroke Maternal Grandmother     Stroke Maternal Grandfather    Rheum arthritis Paternal Grandmother    Colon polyps Neg Hx    Esophageal cancer Neg Hx    Stomach cancer Neg Hx    Rectal cancer Neg Hx    Past Surgical History:  Procedure Laterality Date   CESAREAN SECTION  2009   CESAREAN SECTION N/A 03/09/2017   Procedure: REPEAT CESAREAN SECTION;  Surgeon: Mat Browning, MD;  Location: [facility] BIRTHING SUITES;  Service: Obstetrics;  Laterality: N/A;   COLONOSCOPY  06/2012   Dr.Kaplan-MAC-movi(exc)-normal-10 yr recall   GYNECOLOGIC CRYOSURGERY     NASAL SEPTUM SURGERY  1983   TONSILLECTOMY  1966    Short Social History:  Social History   Tobacco Use   Smoking status: Never    Passive exposure: Never   Smokeless tobacco: Never  Substance Use Topics   Alcohol use: No    No Known Allergies  Current Outpatient Medications  Medication Sig Dispense Refill   ALPRAZolam (XANAX) 1 MG tablet Take 1 mg by mouth at bedtime as needed.     atorvastatin  (LIPITOR) 10 MG tablet Take 1 tablet (10 mg total) by mouth daily. 90 tablet 3   benzonatate  (TESSALON  PERLES) 100 MG capsule Take 1 capsule (100 mg total)  by mouth 3 (three) times daily as needed. 30 capsule 0   Erenumab-aooe (AIMOVIG) 140 MG/ML SOAJ Inject 140 mg into the skin every 28 (twenty-eight) days. 1.12 mL 5   erythromycin  ophthalmic ointment Apply to eye.     famotidine  (PEPCID ) 20 MG tablet Take 1 tablet (20 mg total) by mouth 2 (two) times daily. 180 tablet 1   gabapentin  (NEURONTIN ) 300 MG capsule TAKE 1 CAPSULE BY MOUTH EVERYDAY AT BEDTIME 30 capsule 0   lamoTRIgine  (LAMICTAL ) 200 MG tablet Take 300 mg by mouth daily.     lidocaine  (LIDODERM ) 5 % Place 1 patch onto the skin daily. Remove & Discard patch within 12 hours or as directed by MD 30 patch 5   meloxicam  (MOBIC ) 7.5 MG tablet Take 1 tablet (7.5 mg total) by mouth 2 (two) times daily as needed for pain. 60 tablet 2   montelukast  (SINGULAIR ) 10 MG tablet TAKE ONE TABLET BY MOUTH EVERY DAY AT BEDTIME  90 tablet 1   ondansetron  (ZOFRAN -ODT) 4 MG disintegrating tablet Take 1 tablet (4 mg total) by mouth every 8 (eight) hours as needed for nausea or vomiting. DISSOLVE ONE TABLET ON TONGUE EVERY 8 HOURS AS NEEDED FOR NAUSEA OR VOMITING 60 tablet 3   pantoprazole  (PROTONIX ) 40 MG tablet TAKE ONE TABLET BY MOUTH TWICE A DAY 180 tablet 1   SUMAtriptan (IMITREX) 50 MG tablet Take 1 tablet (50 mg total) by mouth as needed for migraine. May repeat in 2 hours if headache persists or recurs.  Maximum 2 tablets in 24 hours. 10 tablet 5   traZODone (DESYREL) 100 MG tablet Take 200 mg by mouth at bedtime.     traZODone (DESYREL) 50 MG tablet Take 50 mg by mouth daily at 2 PM.     vortioxetine HBr (TRINTELLIX) 20 MG TABS tablet Take 20 mg by mouth daily.     No current facility-administered medications for this visit.    Review of Systems  Constitutional:  Constitutional negative. HENT: HENT negative.  Eyes: Eyes negative.  Respiratory: Respiratory negative.  Cardiovascular: Cardiovascular negative.  GI: Positive for abdominal pain and vomiting.  Musculoskeletal: Musculoskeletal negative.  Skin: Skin negative.  Neurological: Positive for numbness.  Hematologic: Hematologic/lymphatic negative.  Psychiatric: Psychiatric negative.        Objective:  Objective   Vitals:   07/11/24 1110  BP: 112/73  Pulse: 64  Temp: 97.9 F (36.6 C)  SpO2: 95%    Physical Exam HENT:     Head: Normocephalic.     Nose: Nose normal.     Mouth/Throat:     Mouth: Mucous membranes are moist.  Cardiovascular:     Rate and Rhythm: Normal rate.     Pulses: Normal pulses.  Pulmonary:     Effort: Pulmonary effort is normal.  Abdominal:     General: Abdomen is flat.     Palpations: Abdomen is soft. There is no mass.  Musculoskeletal:        General: Normal range of motion.     Right lower leg: No edema.     Left lower leg: No edema.  Skin:    General: Skin is warm.     Capillary Refill: Capillary refill  takes less than 2 seconds.  Neurological:     General: No focal deficit present.     Mental Status: She is alert.  Psychiatric:        Mood and Affect: Mood normal.     Data: CT IMPRESSION: 1. Beaded appearance  of the mid to distal right renal artery extending into proximal segmental branches consistent with underlying fibromuscular dysplasia (FMD). No associated significant stenosis or dissection of the right renal artery. 2. Diffuse irregular beaded appearance of bilateral external iliac arteries consistent with underlying FMD. No evidence of associated stenosis or dissection. 3. No evidence of significant mesenteric arterial occlusive disease. 4. No significant nonvascular findings. 5. Stable degenerative disc disease at L4-5 with associated mild anterolisthesis of L4 on L5 of approximately 5 mm.       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  We reviewed her CT scan imaging together today with very clear beaded appearance of her arteries.  Carotid arteries has not been evaluated in the past.  As such we will follow-up in 1 year with renal artery duplex and carotid artery duplex.  External iliac arteries we will follow-up pulse exam and symptoms thankfully she remains asymptomatic.  After next year if studies remain stable we can move her out to every 2-year follow-up.     Penne Lonni Colorado MD [facility]

## 2024-07-13 ENCOUNTER — Ambulatory Visit: Admitting: Neurology

## 2024-07-16 ENCOUNTER — Other Ambulatory Visit: Payer: Self-pay | Admitting: Internal Medicine

## 2024-07-16 ENCOUNTER — Ambulatory Visit: Payer: Self-pay | Admitting: Internal Medicine

## 2024-07-16 ENCOUNTER — Ambulatory Visit (HOSPITAL_COMMUNITY)
Admission: RE | Admit: 2024-07-16 | Discharge: 2024-07-16 | Disposition: A | Discharge status: Home / Self Care | Source: Ambulatory Visit | Attending: Internal Medicine | Admitting: Internal Medicine

## 2024-07-16 DIAGNOSIS — R55 Syncope and collapse: Secondary | ICD-10-CM

## 2024-07-16 DIAGNOSIS — E785 Hyperlipidemia, unspecified: Secondary | ICD-10-CM

## 2024-07-16 LAB — MYOCARDIAL PERFUSION IMAGING
Angina Index: 0
Duke Treadmill Score: 11
Estimated workload: 12.5
Exercise duration (min): 10 min
Exercise duration (sec): 30 s
LV dias vol: 116 mL (ref 46–106)
LV sys vol: 33 mL (ref 3.8–5.2)
MPHR: 158 {beats}/min
Nuc Stress EF: 72 %
Peak HR: 160 {beats}/min
Percent HR: 101 %
RPE: 19
Rest HR: 62 {beats}/min
Rest Nuclear Isotope Dose: 10.7 mCi
SDS: 0
SRS: 9
SSS: 3
ST Depression (mm): 0 mm
Stress Nuclear Isotope Dose: 30.8 mCi
TID: 0.96

## 2024-07-16 MED ORDER — TECHNETIUM TC 99M TETROFOSMIN IV KIT
30.8000 | PACK | Freq: Once | INTRAVENOUS | Status: AC | PRN
  Administered 2024-07-16: 30.8 via INTRAVENOUS

## 2024-07-16 MED ORDER — TECHNETIUM TC 99M TETROFOSMIN IV KIT
10.7000 | PACK | Freq: Once | INTRAVENOUS | Status: AC | PRN
  Administered 2024-07-16: 10.7 via INTRAVENOUS

## 2024-07-17 LAB — LDL CHOLESTEROL, DIRECT: LDL Direct: 76 mg/dL (ref 0–99)

## 2024-07-18 ENCOUNTER — Ambulatory Visit: Admitting: Cardiology

## 2024-07-20 ENCOUNTER — Telehealth: Payer: Self-pay

## 2024-07-20 NOTE — Telephone Encounter (Signed)
 Copied from CRM [id number]. Topic: Appointments - Appointment Scheduling >> Jul 20, 2024 10:15 AM Diannia H wrote: Patient/patient representative is calling to schedule an appointment, I made her an appointment on 11/11 and she is also wanting to know can she get a booster shot at her appointment as well. Could you call her back and let her know. Patients callback number is [phone].

## 2024-07-20 NOTE — Telephone Encounter (Signed)
 Spoke with patient to clarify what booster shot she was referring to, patient responded measles. Patient aware we do not carry the measles vaccine here and she can receive at her local pharmacy.

## 2024-07-24 ENCOUNTER — Ambulatory Visit (HOSPITAL_COMMUNITY)

## 2024-07-24 ENCOUNTER — Ambulatory Visit: Admitting: Family

## 2024-07-26 MED ORDER — ATORVASTATIN CALCIUM 20 MG PO TABS: 20.0000 mg | ORAL_TABLET | Freq: Every day | ORAL | 3 refills | Status: DC

## 2024-08-01 ENCOUNTER — Telehealth: Payer: Self-pay

## 2024-08-01 NOTE — Telephone Encounter (Signed)
 Maximum dose of meloxicam  should be 15 mg total in a 24 hour period-unable to increase the dose.

## 2024-08-01 NOTE — Telephone Encounter (Signed)
 Patient contacted the office and inquires if her Meloxicam  dose can be increased. Patient states she has been taking the Meloxicam  7.5 mg once in the morning and once at night. Patient states she is still having a hard time getting up in the morning and making it through the night. Patient inquires if the Meloxicam  can be increased to 15 mg twice daily. Patient inquires if the dose is changed, will a new prescription be sent in or would she just increase to two tablets twice daily of the 7.5 mg. Please advise.

## 2024-08-02 ENCOUNTER — Other Ambulatory Visit: Payer: Self-pay | Admitting: Physician Assistant

## 2024-08-02 NOTE — Telephone Encounter (Signed)
 Patient advised Biofreeze or salonpas can be used. There is also topical tylenol  available now.   Do no recommend concurrent use of voltaren gel with meloxicam . Patient verbalized understanding.

## 2024-08-02 NOTE — Telephone Encounter (Signed)
 Biofreeze or salonpas can be used. There is also topical tylenol  available now.   Do no recommend concurrent use of voltaren gel with meloxicam .

## 2024-08-02 NOTE — Telephone Encounter (Signed)
 Patient advised Maximum dose of meloxicam  should be 15 mg total in a 24 hour period-unable to increase the dose. Patient verbalized understanding.   Patient inquired if there was a cream for pain that could be prescribed to her. Patient states when she wakes up in the morning, her hands and fingers are extremely painful and hard to move. Patient states her feet also bother her a lot. Patient states her joints in general are painful. Patient [facility] possible[facility] [address]. Please advise.

## 2024-09-05 ENCOUNTER — Other Ambulatory Visit: Payer: Self-pay | Admitting: Family

## 2024-09-05 ENCOUNTER — Other Ambulatory Visit: Payer: Self-pay | Admitting: Physician Assistant

## 2024-09-05 DIAGNOSIS — K529 Noninfective gastroenteritis and colitis, unspecified: Secondary | ICD-10-CM

## 2024-09-05 MED ORDER — FAMOTIDINE 20 MG PO TABS: 20.0000 mg | ORAL_TABLET | Freq: Two times a day (BID) | ORAL | 1 refills | Status: AC

## 2024-09-05 MED ORDER — GABAPENTIN 300 MG PO CAPS: 300.0000 mg | ORAL_CAPSULE | Freq: Every day | ORAL | 5 refills | Status: AC

## 2024-09-05 NOTE — Telephone Encounter (Signed)
 Please advise if ok to fill and if we can give additional refills

## 2024-09-05 NOTE — Telephone Encounter (Unsigned)
 Copied from CRM [id number]. Topic: Clinical - Medication Refill >> Sep 05, 2024  9:49 AM DeAngela L wrote: Medication: famotidine  (PEPCID ) 20 MG tablet gabapentin  (NEURONTIN ) 300 MG capsule   Requesting one month to [facility] [address] Patient asking for the additional refills sent [facility] she is asking for this to be split cause [facility] will take 25 days before she can get a fill from [facility]   Has the patient contacted their pharmacy? Yes  (Agent: If no, request that the patient contact the pharmacy for the refill. If patient does not wish to contact the pharmacy document the reason why and proceed with request.) (Agent: If yes, when and what did the pharmacy advise?)  This is the patient's preferred pharmacy:  [id number] - [place], [place] - [address] AT [address] [facility] [address] [place] [address] Phone: [phone] Fax: [phone]   [facility] [place] - [address] [place] [phone] Phone: [phone] Fax: [phone]  Is this the correct pharmacy for this prescription? Yes  If no, delete pharmacy and type the correct one.   Has the prescription been filled recently? Yes  Is the patient out of the medication? Yes   Has the patient been seen for an appointment in the last year OR does the patient have an upcoming appointment? Yes  Can we respond through MyChart? Yes   Agent: Please be advised that Rx refills may take up to 3 business days. We ask that you follow-up with your pharmacy.

## 2024-09-07 ENCOUNTER — Other Ambulatory Visit

## 2024-09-07 ENCOUNTER — Other Ambulatory Visit: Payer: Self-pay

## 2024-09-07 ENCOUNTER — Other Ambulatory Visit: Payer: Self-pay | Admitting: *Deleted

## 2024-09-07 DIAGNOSIS — Z5181 Encounter for therapeutic drug level monitoring: Secondary | ICD-10-CM

## 2024-09-07 MED ORDER — MELOXICAM 7.5 MG PO TABS: 7.5000 mg | ORAL_TABLET | Freq: Two times a day (BID) | ORAL | 2 refills | Status: AC | PRN

## 2024-09-07 NOTE — Telephone Encounter (Signed)
 Last Fill: 06/15/2024  Labs: 05/03/2024 BMP normal, 01/04/2024 Platelets 409,   Next Visit: 11/12/2024  Last Visit: 05/15/2024  DX: Primary osteoarthritis of both hands   Current Dose per office note 05/15/2024: meloxicam  7.5 mg 1 tablet twice daily as needed   Okay to refill Meloxicam ?

## 2024-09-08 LAB — COMPREHENSIVE METABOLIC PANEL WITH GFR
AG Ratio: 2.2 (calc) (ref 1.0–2.5)
ALT: 12 U/L (ref 6–29)
AST: 13 U/L (ref 10–35)
Albumin: 4.4 g/dL (ref 3.6–5.1)
Alkaline phosphatase (APISO): 60 U/L (ref 37–153)
BUN/Creatinine Ratio: 35 (calc) — ABNORMAL HIGH (ref 6–22)
BUN: 26 mg/dL — ABNORMAL HIGH (ref 7–25)
CO2: 31 mmol/L (ref 20–32)
Calcium: 9.2 mg/dL (ref 8.6–10.4)
Chloride: 103 mmol/L (ref 98–110)
Creat: 0.74 mg/dL (ref 0.50–1.05)
Globulin: 2 g/dL (ref 1.9–3.7)
Glucose, Bld: 79 mg/dL (ref 65–99)
Potassium: 5 mmol/L (ref 3.5–5.3)
Sodium: 138 mmol/L (ref 135–146)
Total Bilirubin: 0.5 mg/dL (ref 0.2–1.2)
Total Protein: 6.4 g/dL (ref 6.1–8.1)
eGFR: 91 mL/min/1.73m2

## 2024-09-08 LAB — CBC WITH DIFFERENTIAL/PLATELET
Absolute Lymphocytes: 1058 {cells}/uL (ref 850–3900)
Absolute Monocytes: 456 {cells}/uL (ref 200–950)
Basophils Absolute: 61 {cells}/uL (ref 0–200)
Basophils Relative: 1.3 %
Eosinophils Absolute: 291 {cells}/uL (ref 15–500)
Eosinophils Relative: 6.2 %
HCT: 37.5 % (ref 35.9–46.0)
Hemoglobin: 12.2 g/dL (ref 11.7–15.5)
MCH: 31.1 pg (ref 27.0–33.0)
MCHC: 32.5 g/dL (ref 31.6–35.4)
MCV: 95.7 fL (ref 81.4–101.7)
MPV: 10.5 fL (ref 7.5–12.5)
Monocytes Relative: 9.7 %
Neutro Abs: 2834 {cells}/uL (ref 1500–7800)
Neutrophils Relative %: 60.3 %
Platelets: 397 Thousand/uL (ref 140–400)
RBC: 3.92 Million/uL (ref 3.80–5.10)
RDW: 12 % (ref 11.0–15.0)
Total Lymphocyte: 22.5 %
WBC: 4.7 Thousand/uL (ref 3.8–10.8)

## 2024-09-09 ENCOUNTER — Ambulatory Visit: Payer: Self-pay | Admitting: Physician Assistant

## 2024-09-09 NOTE — Progress Notes (Signed)
CBC WNL. CMP stable.

## 2024-09-11 ENCOUNTER — Telehealth: Payer: Self-pay | Admitting: Internal Medicine

## 2024-09-11 DIAGNOSIS — E785 Hyperlipidemia, unspecified: Secondary | ICD-10-CM

## 2024-09-11 MED ORDER — ATORVASTATIN CALCIUM 20 MG PO TABS: 20.0000 mg | ORAL_TABLET | Freq: Every day | ORAL | 2 refills | Status: AC

## 2024-09-11 NOTE — Telephone Encounter (Signed)
 Pt's medication was sent to pt's pharmacy as requested. Confirmation received.

## 2024-09-11 NOTE — Telephone Encounter (Signed)
" °*  STAT* If patient is at the pharmacy, call can be transferred to refill team.   1. Which medications need to be refilled? (please list name of each medication and dose if known) atorvastatin  (LIPITOR) 20 MG tablet  Take 1 tablet (20 mg total) by mouth daily.   2. Would you like to learn more about the convenience, safety, & potential cost savings by using the [facility]? No     3. Are you open to using the [facility] No   4. Which pharmacy/location (including street and city if local pharmacy) is medication to be sent to? [facility] - [place], [place] - [address]    5. Do they need a 30 day or 90 day supply? 90 Day Supply "

## 2024-09-28 ENCOUNTER — Telehealth: Payer: Self-pay | Admitting: Internal Medicine

## 2024-09-28 NOTE — Telephone Encounter (Signed)
" °*  STAT* If patient is at the pharmacy, call can be transferred to refill team.   1. Which medications need to be refilled? (please list name of each medication and dose if known) atorvastatin  (LIPITOR) 20 MG tablet   2. Which pharmacy/location (including street and city if local pharmacy) is medication to be sent to?  [facility] - [place], [place] - [address]      3. Do they need a 30 day or 90 day supply? 90 day  "

## 2024-10-03 NOTE — Telephone Encounter (Signed)
 Pt's medication was already sent to pt's pharmacy as requested. Confirmation received.

## 2024-10-11 ENCOUNTER — Ambulatory Visit: Admitting: Neurology

## 2024-10-19 LAB — LIPID PANEL
Chol/HDL Ratio: 2.2 ratio (ref 0.0–4.4)
Cholesterol, Total: 170 mg/dL (ref 100–199)
HDL: 78 mg/dL
LDL Chol Calc (NIH): 77 mg/dL (ref 0–99)
Triglycerides: 82 mg/dL (ref 0–149)
VLDL Cholesterol Cal: 15 mg/dL (ref 5–40)

## 2024-10-19 LAB — ALT: ALT: 18 [IU]/L (ref 0–32)

## 2024-11-07 ENCOUNTER — Ambulatory Visit: Admitting: Neurology

## 2024-11-12 ENCOUNTER — Ambulatory Visit: Admitting: Physician Assistant

## 2024-12-26 ENCOUNTER — Ambulatory Visit: Admitting: Neurology
